# Patient Record
Sex: Male | Born: 1950 | Race: Black or African American | Hispanic: No | Marital: Single | Smoking: Former smoker
Health system: Southern US, Community
[De-identification: ages and names within clinical notes are randomized; demographics above are authoritative.]

## PROBLEM LIST (undated history)

## (undated) DIAGNOSIS — E119 Type 2 diabetes mellitus without complications: Secondary | ICD-10-CM

## (undated) DIAGNOSIS — E785 Hyperlipidemia, unspecified: Secondary | ICD-10-CM

## (undated) DIAGNOSIS — C801 Malignant (primary) neoplasm, unspecified: Secondary | ICD-10-CM

## (undated) DIAGNOSIS — R0600 Dyspnea, unspecified: Secondary | ICD-10-CM

## (undated) DIAGNOSIS — I10 Essential (primary) hypertension: Secondary | ICD-10-CM

## (undated) DIAGNOSIS — I509 Heart failure, unspecified: Secondary | ICD-10-CM

## (undated) HISTORY — PX: LUNG REMOVAL, PARTIAL: SHX233

---

## 2012-04-30 ENCOUNTER — Emergency Department: Payer: Self-pay | Admitting: Emergency Medicine

## 2012-04-30 LAB — BASIC METABOLIC PANEL WITH GFR
Anion Gap: 7 (ref 7–16)
BUN: 11 mg/dL (ref 7–18)
Calcium, Total: 9.1 mg/dL (ref 8.5–10.1)
Chloride: 107 mmol/L (ref 98–107)
Co2: 26 mmol/L (ref 21–32)
Creatinine: 1.05 mg/dL (ref 0.60–1.30)
EGFR (African American): >60
EGFR (Non-African Amer.): >60
Glucose: 80 mg/dL (ref 65–99)
Osmolality: 278 (ref 275–301)
Potassium: 3.7 mmol/L (ref 3.5–5.1)
Sodium: 140 mmol/L (ref 136–145)

## 2012-04-30 LAB — CBC
HCT: 39.4 % — ABNORMAL LOW (ref 40.0–52.0)
HGB: 13.1 g/dL (ref 13.0–18.0)
MCH: 29.3 pg (ref 26.0–34.0)
MCHC: 33.2 g/dL (ref 32.0–36.0)
MCV: 88 fL (ref 80–100)
Platelet: 267 x10 3/mm 3 (ref 150–440)
RBC: 4.47 x10 6/mm 3 (ref 4.40–5.90)
RDW: 14.2 % (ref 11.5–14.5)
WBC: 5.8 x10 3/mm 3 (ref 3.8–10.6)

## 2012-04-30 LAB — CK TOTAL AND CKMB (NOT AT ARMC): CK-MB: 0.9 ng/mL (ref 0.5–3.6)

## 2012-04-30 LAB — TROPONIN I: Troponin-I: <0.02 ng/mL

## 2013-04-06 ENCOUNTER — Observation Stay: Payer: Self-pay | Admitting: Internal Medicine

## 2013-04-06 LAB — CK TOTAL AND CKMB (NOT AT ARMC)
CK, Total: 146 U/L (ref 35–232)
CK-MB: 0.5 ng/mL (ref 0.5–3.6)

## 2013-04-06 LAB — BASIC METABOLIC PANEL
Anion Gap: 3 — ABNORMAL LOW (ref 7–16)
Calcium, Total: 9 mg/dL (ref 8.5–10.1)
Chloride: 103 mmol/L (ref 98–107)
Co2: 33 mmol/L — ABNORMAL HIGH (ref 21–32)
EGFR (African American): 48 — ABNORMAL LOW
Osmolality: 280 (ref 275–301)
Potassium: 3.5 mmol/L (ref 3.5–5.1)

## 2013-04-06 LAB — CBC
HCT: 40 % (ref 40.0–52.0)
MCHC: 33.9 g/dL (ref 32.0–36.0)
Platelet: 274 10*3/uL (ref 150–440)
RBC: 4.58 10*6/uL (ref 4.40–5.90)
RDW: 13.3 % (ref 11.5–14.5)
WBC: 6.6 10*3/uL (ref 3.8–10.6)

## 2013-04-06 LAB — TSH: Thyroid Stimulating Horm: 1.52 u[IU]/mL

## 2013-04-06 LAB — TROPONIN I: Troponin-I: <0.02 ng/mL

## 2013-04-07 LAB — BASIC METABOLIC PANEL
Calcium, Total: 9.1 mg/dL (ref 8.5–10.1)
Chloride: 108 mmol/L — ABNORMAL HIGH (ref 98–107)
Co2: 28 mmol/L (ref 21–32)
Creatinine: 1.12 mg/dL (ref 0.60–1.30)
EGFR (Non-African Amer.): >60
Glucose: 75 mg/dL (ref 65–99)
Osmolality: 281 (ref 275–301)
Potassium: 4.3 mmol/L (ref 3.5–5.1)
Sodium: 141 mmol/L (ref 136–145)

## 2013-09-02 DIAGNOSIS — C3490 Malignant neoplasm of unspecified part of unspecified bronchus or lung: Secondary | ICD-10-CM | POA: Insufficient documentation

## 2014-09-25 ENCOUNTER — Observation Stay: Payer: Self-pay | Admitting: Internal Medicine

## 2014-09-25 LAB — CK TOTAL AND CKMB (NOT AT ARMC)
CK, TOTAL: 119 U/L
CK, Total: 124 U/L
CK, Total: 114 U/L
CK-MB: 1.1 ng/mL (ref 0.5–3.6)
CK-MB: 1.4 ng/mL (ref 0.5–3.6)
CK-MB: 1.2 ng/mL (ref 0.5–3.6)

## 2014-09-25 LAB — COMPREHENSIVE METABOLIC PANEL
ALBUMIN: 3.8 g/dL (ref 3.4–5.0)
ALK PHOS: 86 U/L
ANION GAP: 7 (ref 7–16)
AST: 36 U/L (ref 15–37)
BILIRUBIN TOTAL: 0.4 mg/dL (ref 0.2–1.0)
BUN: 17 mg/dL (ref 7–18)
CHLORIDE: 104 mmol/L (ref 98–107)
CREATININE: 1.31 mg/dL — AB (ref 0.60–1.30)
Calcium, Total: 9.1 mg/dL (ref 8.5–10.1)
Co2: 32 mmol/L (ref 21–32)
EGFR (African American): >60
EGFR (Non-African Amer.): 59 — ABNORMAL LOW
GLUCOSE: 79 mg/dL (ref 65–99)
Osmolality: 285 (ref 275–301)
Potassium: 3.6 mmol/L (ref 3.5–5.1)
SGPT (ALT): 32 U/L
Sodium: 143 mmol/L (ref 136–145)
TOTAL PROTEIN: 7.8 g/dL (ref 6.4–8.2)

## 2014-09-25 LAB — TROPONIN I
TROPONIN-I: 0.04 ng/mL
Troponin-I: <0.02 ng/mL
Troponin-I: 0.05 ng/mL

## 2014-09-25 LAB — CBC
HCT: 41.5 % (ref 40.0–52.0)
HGB: 13.2 g/dL (ref 13.0–18.0)
MCH: 28.4 pg (ref 26.0–34.0)
MCHC: 31.8 g/dL — ABNORMAL LOW (ref 32.0–36.0)
MCV: 89 fL (ref 80–100)
Platelet: 198 10*3/uL (ref 150–440)
RBC: 4.64 10*6/uL (ref 4.40–5.90)
RDW: 12.9 % (ref 11.5–14.5)
WBC: 7.1 10*3/uL (ref 3.8–10.6)

## 2014-09-25 LAB — LIPID PANEL
Cholesterol: 162 mg/dL (ref 0–200)
HDL: 56 mg/dL (ref 40–60)
Ldl Cholesterol, Calc: 90 mg/dL (ref 0–100)
Triglycerides: 79 mg/dL (ref 0–200)
VLDL Cholesterol, Calc: 16 mg/dL (ref 5–40)

## 2014-09-25 LAB — PRO B NATRIURETIC PEPTIDE: B-TYPE NATIURETIC PEPTID: 31 pg/mL (ref 0–125)

## 2014-09-25 LAB — D-DIMER(ARMC): D-DIMER: 1962 ng/mL

## 2014-09-26 LAB — CBC WITH DIFFERENTIAL/PLATELET
Basophil #: 0.1 10*3/uL (ref 0.0–0.1)
Basophil %: 0.9 %
EOS ABS: 0.2 10*3/uL (ref 0.0–0.7)
Eosinophil %: 3.4 %
HCT: 36.9 % — ABNORMAL LOW (ref 40.0–52.0)
HGB: 12.2 g/dL — AB (ref 13.0–18.0)
Lymphocyte #: 1.9 10*3/uL (ref 1.0–3.6)
Lymphocyte %: 33.7 %
MCH: 29.1 pg (ref 26.0–34.0)
MCHC: 33.1 g/dL (ref 32.0–36.0)
MCV: 88 fL (ref 80–100)
MONOS PCT: 11.7 %
Monocyte #: 0.7 x10 3/mm (ref 0.2–1.0)
NEUTROS ABS: 2.8 10*3/uL (ref 1.4–6.5)
NEUTROS PCT: 50.3 %
PLATELETS: 199 10*3/uL (ref 150–440)
RBC: 4.19 10*6/uL — ABNORMAL LOW (ref 4.40–5.90)
RDW: 13.1 % (ref 11.5–14.5)
WBC: 5.6 10*3/uL (ref 3.8–10.6)

## 2014-09-26 LAB — LIPID PANEL
CHOLESTEROL: 139 mg/dL (ref 0–200)
HDL Cholesterol: 43 mg/dL (ref 40–60)
Ldl Cholesterol, Calc: 80 mg/dL (ref 0–100)
Triglycerides: 79 mg/dL (ref 0–200)
VLDL CHOLESTEROL, CALC: 16 mg/dL (ref 5–40)

## 2014-09-26 LAB — COMPREHENSIVE METABOLIC PANEL
Albumin: 3.3 g/dL — ABNORMAL LOW (ref 3.4–5.0)
Alkaline Phosphatase: 73 U/L
Anion Gap: 7 (ref 7–16)
BUN: 16 mg/dL (ref 7–18)
Bilirubin,Total: 0.2 mg/dL (ref 0.2–1.0)
CHLORIDE: 104 mmol/L (ref 98–107)
Calcium, Total: 8.4 mg/dL — ABNORMAL LOW (ref 8.5–10.1)
Co2: 30 mmol/L (ref 21–32)
Creatinine: 1.34 mg/dL — ABNORMAL HIGH (ref 0.60–1.30)
GFR CALC NON AF AMER: 58 — AB
Glucose: 82 mg/dL (ref 65–99)
Osmolality: 282 (ref 275–301)
Potassium: 3.4 mmol/L — ABNORMAL LOW (ref 3.5–5.1)
SGOT(AST): 27 U/L (ref 15–37)
SGPT (ALT): 29 U/L
SODIUM: 141 mmol/L (ref 136–145)
TOTAL PROTEIN: 6.8 g/dL (ref 6.4–8.2)

## 2015-04-09 NOTE — H&P (Signed)
PATIENT NAME:  [name] [name], [name] MR#:  [id number] DATE OF BIRTH:  23-Dec-1950  DATE OF ADMISSION:  04/06/2013  PRIMARY CARE PHYSICIAN:  [facility] [place]:  Dr. Renard Hamper.   CHIEF COMPLAINT:  Rapid heartbeat.   HISTORY OF PRESENT ILLNESS:  The patient is a 64 year old African American male usually follow up at the [facility] in [place].  The patient has history of lung cancer, status post left pneumonectomy, history of hypertension and hypercholesterolemia.  He was in his usual state of health until last evening around 11:00 p.m. when he developed palpitations described as rapid heartbeats in his chest that was fast.  There was no associated chest pain.  No shortness of breath.  No syncope.  He has no prior history of such palpitations or any cardiac history.  He came to the Emergency Department for evaluation.  He was found to have atrial fibrillation with rapid ventricular rate.  The patient was admitted for further evaluation and treatment.  His heart rhythm showed atrial fibrillation with a rate of 150 per minute.   REVIEW OF SYSTEMS:  CONSTITUTIONAL:  Denies having any fever.  No chills.  No fatigue.  EYES:  No blurring of vision.  No double vision.  EARS, NOSE, THROAT:  No hearing impairment.  No sore throat.  No dysphagia.  CARDIOVASCULAR:  Reports palpitations as above.  No chest pain.  No shortness of breath.  No syncope.  RESPIRATORY:  No shortness of breath.  No chest pain.  No cough.  No hemoptysis.  GASTROINTESTINAL:  No abdominal pain.  No vomiting.  No diarrhea.  GENITOURINARY:  No dysuria.  No frequency of urination.  MUSCULOSKELETAL:  No joint pain or swelling.  No muscular pain or swelling.  No calf tenderness.  INTEGUMENTARY:  No skin rash.  No ulcers.  NEUROLOGY:  No focal weakness.  No seizure activity.  No headache.  PSYCHIATRY:  No anxiety.  No depression.  ENDOCRINE:  No polyuria or polydipsia.  No heat or cold intolerance.   PAST MEDICAL HISTORY:  History of  lung cancer status post left pneumonectomy in 2005, systemic hypertension, hypercholesterolemia.   PAST SURGICAL HISTORY:  Left lung pneumonectomy in 2005 for lung cancer.  No other surgeries in the past.   SOCIAL HABITS:  Chronic smoker 1 pack a day on and off since age of 63.  He stopped smoking in 2004.  He drinks alcohol only socially.  No other drug abuse.   SOCIAL HISTORY:  He is divorced, lives at home alone.  He is on disability.   FAMILY HISTORY:  Both parents are deceased.  He does not know how did his father died.  His mother suffered from heart problems and she died at the age of 66.   ADMISSION MEDICATIONS:  The patient did not bring his medications, but he remembers he is on lisinopril and a statin.   ALLERGIES:  No known drug allergies.   PHYSICAL EXAMINATION: VITAL SIGNS:  Blood pressure was 100/70, respiratory rate 18, his pulse earlier was 150, then gradually down to 106 per minute.  Temperature 98.6, oxygen saturation 98%.  GENERAL APPEARANCE:  Middle-aged male lying in bed in no acute distress.  HEAD AND NECK:  No pallor.  No icterus.  No cyanosis.  Ear examination revealed normal hearing, no discharge, no lesions.  Examination of the nose showed no discharge, no bleeding.  No ulcers.  Examination of the oropharyngeal area showed normal lips and tongue, no oral thrush, no  exudates, no ulcers.  Eye examination revealed normal eyelids and conjunctivae.  Pupils are constricted, reactive to light.  NECK:  Supple.  Trachea at midline.  No thyromegaly.  No cervical lymphadenopathy.  No masses.  HEART:  Revealed irregular S1 and S2.  No S3 or S4.  No murmur.  No gallop.  No carotid bruits.  RESPIRATORY:  Revealed a normal breathing pattern.  The lungs are clear on the right.  On the left absent breathing sounds and dullness in percussion.  ABDOMEN:  Soft without tenderness.  No hepatosplenomegaly.  No masses.  No hernias.  SKIN:  Revealed no ulcers.  No subcutaneous nodules.   MUSCULOSKELETAL:  No joint swelling.  No clubbing.  NEUROLOGIC:  Cranial nerves II through XII are intact.  No focal motor deficit.  PSYCHIATRIC:  The patient is alert and oriented x 3.  Mood and affect were normal.   LABORATORY FINDINGS:  His EKG, the rhythm strip showed atrial fibrillation with rapid ventricular rate at 150 per minute.  Subsequent EKG showed improvement in the heart rate down to 97 per minute, remains in atrial fibrillation.  Nonspecific T-wave abnormalities.  Otherwise, unremarkable EKG.  His chest x-ray showed total opacification or whitening of the left side of the chest consistent with his history of left pneumonectomy.  The right lung is clear.  His blood work-up showed a glucose of 114, BUN 16, creatinine 1.76.  We do not have a baseline for his creatinine.  Sodium 139, potassium 3.5.  Estimated GFR is 48.  Calcium 9.8.  Troponin less than 0.02.  CBC showed a white count of 6000, hemoglobin 13, hematocrit 40, platelet count 274.   ASSESSMENT: 1.  New onset of atrial fibrillation with rapid ventricular rate.  2.  Systemic hypertension.  3.  Renal failure, likely it is chronic kidney disease stage 3, however I cannot confirm that unless we bring his records from the [facility].  4.  Hypercholesterolemia.  5.  History of lung cancer, status post left pneumonectomy.   PLAN:  To admit the patient to the telemetry.  I will start metoprolol 25 mg twice a day to slow the heart rate.  Start aspirin 325 mg a day.  Check TSH.  Obtain echocardiogram.  Cardiology consultation.  IV hydration and repeat basic metabolic profile tomorrow.  I will hold his lisinopril for the time being since he has borderline blood pressure.  I will continue statin, although he does not remember the name.  I will place him on simvastatin 20 mg a day.  We will start subcutaneous heparin 5000 units twice a day for deep vein thrombosis prophylaxis.   Time spent in evaluating this patient took more than 55  minutes.      ____________________________ Clovis Pu. Lenore Manner, MD amd:ea D: 04/06/2013 03:34:19 ET T: 04/06/2013 04:28:35 ET JOB#: [id number]  cc: Clovis Pu. Lenore Manner, MD, <Dictator> Ellin Saba MD ELECTRONICALLY SIGNED 04/07/2013 3:55

## 2015-04-09 NOTE — Consult Note (Signed)
PATIENT NAME:  [name] [name], [name] [name] MR#:  [id number] DATE OF BIRTH:  08/03/1951  DATE OF CONSULTATION:  04/06/2013  REFERRING PHYSICIAN:  Dr. Doy Hutching. CONSULTING PHYSICIAN:  Corey Skains, MD  REASON FOR CONSULTATION: Chronic kidney disease, hypertension, shortness of breath with atrial fibrillation with rapid ventricular rate.   CHIEF COMPLAINT: "I got short of breath."   HISTORY OF PRESENT ILLNESS: This is a 64 year old male with known hypertension and chronic kidney disease, on appropriate medications in the past with reasonable control. The patient has had new onset of shortness of breath with and without physical activity and having some significant palpitations recently, most consistent with atrial fibrillation. The patient has had admission to the Emergency Room with an EKG showing atrial fibrillation with rapid ventricular rate and nonspecific ST and T wave changes. He has had appropriate medication management and he has spontaneously converted to normal sinus rhythm at  this time. Current EKG shows normal sinus rhythm.   REVIEW OF SYSTEMS: The remainder of his review of systems is negative for vision change, ringing in the ears, hearing loss, cough, congestion, heartburn, nausea, vomiting, diarrhea, bloody stools, stomach pain, extremity pain, leg weakness, cramping of the buttocks, known blood clots, headaches, blackouts, dizzy spells, nosebleeds, congestion, trouble swallowing, frequent urination, urination at night, muscle weakness, numbness, anxiety, depression, skin lesions, skin rashes.   PAST MEDICAL HISTORY: 1.  Hypertension.  2.  Chronic kidney disease.  3.  Atrial fibrillation.   FAMILY HISTORY: No family members with early onset of cardiovascular disease or hypertension.   SOCIAL HISTORY: Currently denies tobacco use, occasionally drinking alcohol.   ALLERGIES:  AS LISTED.   MEDICATIONS: As listed.   PHYSICAL EXAMINATION: VITAL SIGNS: Blood pressure 133/68 bilaterally.  Heart rate 68 upright, reclining and regular.  GENERAL: He is a well-appearing male in no acute distress.  HEAD, EYES, EARS, NOSE AND THROAT: No icterus, thyromegaly, ulcers, hemorrhage or xanthelasma.  CARDIOVASCULAR: Regular rate and rhythm with normal S1 and S2 without murmur, gallop or rub. PMI is normal size and placement. Carotid upstroke normal without bruit. Jugular venous pressure is normal.  LUNGS: Lungs are clear to auscultation with normal respirations.  ABDOMEN: Soft, nontender, without hepatosplenomegaly or masses. Abdominal aorta is normal size without bruit.  EXTREMITIES: Show 2+ bilateral pulses in dorsal, pedal, radial and femoral arteries without lower extremity edema, cyanosis, clubbing or ulcers.  NEUROLOGIC: He is oriented to time, place and [place] with normal mood and affect.   ASSESSMENT: A 64 year old male with chronic kidney disease, hypertension with new onset of shortness of breath and palpitations consistent with atrial fibrillation with rapid ventricular rate, now in normal sinus rhythm on appropriate medications.   RECOMMENDATIONS: 1.  Continue current medication management for maintenance of normal rhythm and heart rate control without change.  2.  No anticoagulation at this time due to CHADs score of 1 and using aspirin due to maintenance of normal rhythm and currently in normal rhythm.  3.  Echocardiogram for LV systolic dysfunction and valvular heart disease.  4.  Ambulate and follow for any further significant symptoms and possible discharge to home if the patient feeling well and no further significant symptoms with followup with adjustments of medications thereafter.   ____________________________ Corey Skains, MD bjk:cs D: 04/06/2013 14:33:56 ET T: 04/06/2013 15:57:17 ET JOB#: [id number]  cc: Corey Skains, MD, <Dictator> Corey Skains MD ELECTRONICALLY SIGNED 04/09/2013 8:12

## 2015-04-09 NOTE — Discharge Summary (Signed)
PATIENT NAME:  [name] [name], [name] [name] MR#:  [id number] DATE OF BIRTH:  09/29/1951  DATE OF ADMISSION:  04/06/2013 DATE OF DISCHARGE:  04/07/2013  DISCHARGE DIAGNOSES:  1.  New onset atrial fibrillation, converted to normal sinus rhythm. 2.  Hypertension.  3.  Lung mass status post lobectomy in the past.  4.  Renal failure, improved.  5.  Hyperlipidemia.   CONDITION ON DISCHARGE: Stable.   CODE STATUS: FULL CODE.   DISCHARGE MEDICATIONS: 1.  Multivitamin 1 tablet orally once a day. 2.  Tramadol 50 mg oral tablet 4 times a day. 3.  Simvastatin 20 mg oral tablet once a day. 4.  Aspirin 81 mg once a day. 5.  Metoprolol 25 mg 2 times a day.   HOME HEALTH ON DISCHARGE: No.   HOME OXYGEN: No.   DIET ON DISCHARGE: Low sodium. Diet consistency regular.   ACTIVITY: As tolerated.   TIMEFRAME TO FOLLOWUP: Within 2 to 4 weeks.  Routine followups with primary care physician.   HISTORY OF PRESENT ILLNESS: This is a 64 year old African American male with history of lung cancer status post left pneumonectomy, hypertension and hypercholesterolemia.  In the previous night he developed palpitations described as rapid heartbeats that was fast associated with chest pain. No shortness of breath. No syncopal episode. No prior history of palpitations or any cardiac history. Came to the Emergency Room for evaluation and found having atrial fibrillation with ventricular rate of 150 per minute.   HOSPITAL COURSE AND STAY:  New onset atrial fibrillation:  Converted to normal sinus rhythm spontaneously. He was started on metoprolol. Cardiology consult was done and they suggested to do echocardiogram. His heart rate remained under control with metoprolol and so no further anticoagulation suggested by cardiologist. He was discharged on aspirin after echocardiogram was done.   OTHER MEDICAL ISSUES ADDRESSED IN THIS HOSPITAL STAY: 1.  Systemic hypertension. Came under control with metoprolol.  2.  Renal failure. Likely it  was chronic kidney disease, stage III. We repeated it and remained stable.  3.  Hypercholesterolemia. Simvastatin was given.  4.  History of lung cancer status post left pneumonectomy. The patient was following at the [facility] for this. We advised to continue followup.  IMPORTANT LABORATORY AND DIAGNOSTICS:  On presentation, creatinine was 1.76, which came down to 1.12. Troponin, all 3 remain negative. TSH was 1.52, normal. WBC total  6600, normal.   Echocardiogram:  Left ventricular ejection fraction 60%, decreased left ventricular  size, mild to moderate mitral valve regurgitation, mild to moderate tricuspid regurgitation.   TOTAL TIME SPENT ON THIS DISCHARGE: 45 minutes.  ____________________________ Ceasar Lund Anselm Jungling, MD vgv:sb D: 04/11/2013 08:34:20 ET T: 04/11/2013 09:26:05 ET JOB#: [id number]  cc: Ceasar Lund. Anselm Jungling, MD, <Dictator> Vaughan Basta MD ELECTRONICALLY SIGNED 04/12/2013 22:48

## 2015-04-10 NOTE — Consult Note (Signed)
   Present Illness The patient is a 64 year old African American male with past medical history significant for history of hypertension, hyperlipidemia, who is noncompliant with the use of statin, as well as history of lung cancer, status post Lobectomy.  He has ruled out for myocardial infarction.  He complains of shortness of breath with ambulation with occasional chest pain.  He denies any prior cardiac history.  Electrocardiogram revealed nonspecific ST T wave changes with no evidence of ischemia. he had a trivial troponin elevation felt to be secondary to mild demand ischemia.   Physical Exam:  GEN thin   HEENT PERRL   NECK supple   RESP decreased breath sounds on the left   CARD Regular rate and rhythm   ABD denies tenderness   LYMPH negative neck   EXTR negative cyanosis/clubbing   SKIN normal to palpation   NEURO cranial nerves intact, motor/sensory function intact   PSYCH A+O to time, place, [place]   Review of Systems:  Subjective/Chief Complaint Exertional shortness of breath   General: Fatigue   Skin: No Complaints   ENT: No Complaints   Eyes: No Complaints   Neck: No Complaints   Respiratory: Short of breath   Cardiovascular: Dyspnea   Gastrointestinal: No Complaints   Genitourinary: No Complaints   Vascular: No Complaints   Musculoskeletal: No Complaints   Neurologic: No Complaints   Hematologic: No Complaints   Endocrine: No Complaints   Psychiatric: No Complaints   Review of Systems: All other systems were reviewed and found to be negative   Medications/Allergies Reviewed Medications/Allergies reviewed   Family & Social History:  Family and Social History:  Family History COPD    No Known Allergies:    Impression 64 year old male with history of lung carcinoma status post left lung lobectomy.  He was admitted with progressive shortness of breath.  He has ruled out from are cardia infarction.  He does have trivial troponin elevation  felt to be secondary to demand ischemia.  Echocardiogram reveals normal left ventricular function with level  evidence moderate pulmonary hypertension.  His right ventricle is mildly enlarged.  There is no left-sided abnormalities.  Valves appear normal.  His symptoms appear to be secondary to pulmonary exacerbation.  His echo reveals preserved left ventricular function.  He underwent an ETT today with  limited x-rays tolerance due to shortness of breath.  There were no ischemic changes.  Will review the sestamibi images when available.  Should these be normal, would discharge with outpatient follow-up with his pulmonary and Oncology physicians.   Plan 1. Continue current medications 2. Will review sestamibi images when available 3. Activity as tolerated 4. further recommendations after functional study images are reviewed however if no ischemia, would discharge with outpatient follow-up.   Electronic Signatures: Teodoro Spray (MD)  (Signed 10-Oct-15 10:55)  Authored: General Aspect/Present Illness, History and Physical Exam, Review of System, Family & Social History, Allergies, Impression/Plan   Last Updated: 10-Oct-15 10:55 by Teodoro Spray (MD)

## 2015-04-10 NOTE — Discharge Summary (Signed)
PATIENT NAME:  [name] [name], [name] [name] MR#:  [id number] DATE OF BIRTH:  1951/11/27  DATE OF ADMISSION:  09/25/2014 DATE OF DISCHARGE:  09/26/2014  ADMISSION DIAGNOSES:   1.  Acute dyspnea.  2.  Chest pain.   DISCHARGE DIAGNOSES:  1.  Shortness of breath, probably related more to elevated blood pressure and possible underlying chronic obstructive pulmonary disease without a formal diagnosis.  2.  Chest pain, atypical in nature.  3.  Essential hypertension.   LABORATORY DATA: Discharge white blood cells 5.6, hemoglobin 12.2, hematocrit 37, platelets are 199,000.   Echocardiogram showed normal ejection fraction of 60-65% with no valvular abnormalities.   Nuclear medicine stress test showed low-risk scan. No significant wall motion abnormalities. Ejection fraction at that point was 23%; however, I did speak with Dr. Ubaldo Glassing and this does not seem accurate. The echo was more accurate for his ejection fraction.   PERTINENT LABORATORIES: Troponins x 3 were negative.   LDL IS 80. Cholesterol 39. HDL 43.   CT of the chest was negative for pulmonary emboli, but it did show some emphysema.   HOSPITAL COURSE: A 64 year old male who presented with acute dyspnea. For further details, please refer to the H and P.  1.  Acute dyspnea. The patient's blood pressure was elevated when he had this dyspneic attack. He is no longer complaining of any dyspnea, at this is more likely related to his elevated blood pressure, or could be related to underlying undiagnosed chronic obstructive pulmonary disease/emphysema. In any case, he had a CT of the chest which was negative for PE and also underwent echocardiogram, which showed a normal ejection fraction.  2.  Atypical chest pain, not cardiac in nature. The patient underwent a nuclear medicine stress test which essentially was a low risk test and all troponins were negative. Telemetry was also negative. His ejection fraction on the Myoview was 23%. However, I did speak with Dr.  Ubaldo Glassing, who actually thought that this might have been done while there was a PVC. His echocardiogram shows a normal ejection fraction, which is believed to be the true representation of his EF.  3.  Essential hypertension. Initially the patient's blood pressure was elevated. It has been stable here and he will return to home on his outpatient medications.  Possible emphysema. I discussed with the patient that he will need outpatient pulmonary function tests and pulmonary consultation.   DISCHARGE MEDICATIONS:  1.  Multivitamin 1 tablet daily.  2.  Simvastatin 20 mg daily. 3.  Hydrochlorothiazide/lisinopril 12.5/20 daily.  4.  Aspirin 325 mg daily.  5.  Ventolin 2 puffs 4 times a day.   DISCHARGE DIET: Regular diet.   DISCHARGE ACTIVITY: As tolerated.   DISCHARGE FOLLOWUP: The patient does not have a primary care physician in this area, but will need to follow up.   PHYSICAL EXAMINATION  VITAL SIGNS: On discharge the patient is afebrile, temperature 97.6. Pulse is a 74. Respirations 18. Blood pressure 102/78. Oxygen saturation 98% on room air.  GENERAL: The patient is alert and oriented, not in acute distress.   CARDIOVASCULAR: Regular rate and rhythm. No murmur, gallops or rubs. PMI is not displaced.  LUNGS: Clear to auscultation, without crackles, rales, rhonchi or wheezing. Normal to percussion.  ABDOMEN: Bowel sounds positive. Nontender, nondistended. No hepatosplenomegaly.  EXTREMITIES: No clubbing, cyanosis, or edema. NEUROLOGIC: Cranial nerves II through XII are intact. There are no focal deficits.  MUSCULOSKELETAL: There is 5/5 strength in all extremities.    DISPOSITION: The patient was stable  for discharge.  TIME SPENT: Approximately 40 minutes on discharge.    ____________________________ Gaylia Kassel P. Benjie Karvonen, MD spm:MT D: 09/26/2014 12:13:53 ET T: 09/26/2014 13:38:01 ET JOB#: [id number]  cc: Klayten Jolliff P. Benjie Karvonen, MD, <Dictator> Donell Beers Afia Messenger MD ELECTRONICALLY SIGNED 09/26/2014 21:22

## 2015-04-10 NOTE — H&P (Signed)
PATIENT NAME:  [name] [name], [name] [name] MR#:  [id number] DATE OF BIRTH:  Aug 29, 1951  DATE OF ADMISSION:  09/25/2014  PRIMARY CARE PHYSICIAN:  unknown at present   HISTORY OF PRESENT ILLNESS:  The patient is a 64 year old African American male with past medical history significant for history of hypertension, hyperlipidemia, who is noncompliant with the use of statin, as well as history of lung cancer, status post lobectomy in 2005 who presented to the hospital with complaints of shortness of breath. Apparently, according to patient, he woke up in the morning at around 5:00 a.m. and he became suddenly very short of breath.  He felt frightened and he was brought to the Emergency Room for further evaluation. In the Emergency Room, he was felt to have to have mild COPD exacerbation since his CT scan of chest revealed significant emphysema. He was given inhalation therapy, however, because of mild elevation of troponin from 0.02-0.05, as well as chest pains which were elicited upon further questioning of this patient, hospitalist services were contacted for admission.   PAST MEDICAL HISTORY: Significant for history of hypertension, hyperlipidemia, however, noncompliant with statin. He takes statin probably 3 or 4 times a week. History of left lung carcinoma status post lobectomy in 2005, history of atrial fibrillation converted to sinus rhythm in April 2014, also renal failure during the same admission.   OUTPATIENT MEDICATIONS:  Aspirin 81 mg p.o. daily, HCTZ/lisinopril 12.5 mg/10 mg 1 daily, multivitamins once daily and simvastatin 10 mg p.o. at bedtime, however, the patient takes 3 or 4 times a week only.   SURGICAL HISTORY: Left lung lobectomy in 2005 for lung carcinoma. No other surgeries.   SOCIAL HISTORY: He used to smoke 1 pack a day for approximately 5-7 years, stopped smoking in 2005, alcohol socially, no drug abuse.   SOCIAL HISTORY: Divorced, lives at home alone, on disability, works from home.   REVIEW  OF SYSTEMS: Positive for pains in the chest as well as left shoulder pains intermittently, admits to pains whenever he exerts himself intermittently, however, not all the time. He admits to seasonal allergies, significant shortness of breath, which happened today early in the morning for which he says he does not take any inhalers. Left shoulder pains, left knee pains intermittently. He exercises quite extensively. He does push-ups as well as jumping jacks, sit-ups at least 4 times a week. He also walks, but admits to having some intermittent chest pains with exertion, denies any high fevers or chills, fatigue, weakness, weight loss or gain.  EYES: Denies any blurry vision, double vision, glaucoma or cataracts.  EARS: Denies tinnitus, epistaxis, sinus pain, dentures, difficulty swallowing.  RESPIRATORY: Denies any wheezes, cough, phlegm production, but has some shortness of breath.   CARDIOVASCULAR: Denies chest pains, orthopnea, arrhythmias, palpitations or syncope. Admits to having chest pains as mentioned above, intermittently with exertion.  GASTROINTESTINAL: Denies nausea, vomiting, diarrhea or constipation. GENITOURINARY: Denies dysuria or hematuria, frequency, incontinence.  ENDOCRINOLOGY: Denies polydipsia, thyroid problems, heat or cold intolerance or thirst.  HEMATOLOGIC: Denies anemia, easy bruising or bleeding, swollen glands.  SKIN: Denies any acne, rash, lesions, or change in moles.  MUSCULOSKELETAL: Denies arthritis, cramps, swelling.  NEUROLOGIC: Denies numbness, or thirst or tremor.  PSYCHIATRIC: Denies anxiety, insomnia, depression.   PHYSICAL EXAMINATION:  VITAL SIGNS: On arrival to the hospital the patient's vital signs: Temperature was not measured, pulse is 88, respirations were 20, blood pressure 157/97, Oxygenation was 96% on room air. Apparent on EMS arrival the patient's blood pressure was as  high as 180 initially.  GENERAL: This is a well-developed, well-nourished African  American male in no significant distress, walking in the room.  HEENT: His pupils equal, reactive to light. Extraocular movements are intact, no icterus or conjunctivitis. Has normal hearing. No pharyngeal erythema. Mucosa is moist.  NECK: No masses. Supple, nontender. Thyroid is not enlarged. No adenopathy. No JVD or carotid bruits bilaterally. Full range of motion.  LUNGS: Diminished breath sounds bilaterally, especially on the left side, but no significant rales, rhonchi or wheezing. No labored inspirations, increased dullness to percussion, no overt respiratory distress.  CARDIOVASCULAR: S1, S2 appreciated. Rhythm is regular, PMI not lateralized.   CHEST:  Nontender to palpation.  EXTREMITIES:  1+ pedal pulses. No lower extremity edema, calf tenderness or cyanosis was noted.  ABDOMEN: Soft, nontender. Bowel sounds are present. No splenomegaly or masses were noted.  RECTAL: Deferred.  MUSCLE STRENGTH: Able to move all extremities. No cyanosis, degenerative joint disease or kyphosis. Gait was not tested.  SKIN: Did not reveal any rashes, lesions, erythema, nodularity, induration. It was warm and dry to palpation.  LYMPHATIC: No adenopathy in the cervical region.  NEUROLOGIC: Cranial nerves grossly intact. Sensory is intact. No dysarthria or aphasia. The patient is alert, oriented to time, [place] and place, cooperative. Memory is good.  PSYCHIATRIC: No significant confusion, agitation, or depression were noted.   LABORATORY DATA:  BMP:  Creatinine 1.31, otherwise BMP was unremarkable. Liver enzymes were unremarkable. Cardiac enzymes x 2 were normal. CBC: White blood cell count was 7.1, hemoglobin was 13.2, platelet count 198,000. D-dimer elevated at 1962.    EKG showed normal sinus rhythm nonspecific ST-T changes.   RADIOLOGIC STUDIES: Chest x-ray PA and lateral 09/25/2014, showed status post left pneumonectomy with shift of heart and mediastinum toward the right, right lung is clear. No  appreciable change from prior study according to radiologist.   CT scan of the chest with IV contrast 09/25/2014, revealed a previous left pneumonectomy, negative for acute PE or aortic dissection. Emphysema was also noted.   ASSESSMENT AND PLAN:  1.  Acute dyspnea, likely mild chronic obstructive pulmonary disease exacerbation. Admitted to the medical floor. Initially DuoNebs, Advair Diskus as well as Combivent.  2.  Chest pain, started patient on metoprolol, aspirin, nitroglycerin and heparin subcutaneously. I will get a stress test in the morning.  3.  Malignant essential hypertension. We will continue home medications following his blood pressure very closely. The patient will have nitroglycerin as well as metoprolol added during this admission.  4.  Renal insufficiency. We will follow in the morning. We will start the patient on low rate IV fluids due to IV dye for CTA.   TIME SPENT: 50 minutes.    ____________________________ Theodoro Grist, MD rv:lt D: 09/25/2014 17:57:56 ET T: 09/25/2014 19:09:00 ET JOB#: [id number]  cc: Theodoro Grist, MD, <Dictator> Tiane Szydlowski MD ELECTRONICALLY SIGNED 10/16/2014 12:19

## 2017-02-21 DIAGNOSIS — R0781 Pleurodynia: Secondary | ICD-10-CM | POA: Insufficient documentation

## 2019-12-30 DIAGNOSIS — I10 Essential (primary) hypertension: Secondary | ICD-10-CM | POA: Insufficient documentation

## 2019-12-30 DIAGNOSIS — R1030 Lower abdominal pain, unspecified: Secondary | ICD-10-CM | POA: Insufficient documentation

## 2019-12-30 DIAGNOSIS — Z85118 Personal history of other malignant neoplasm of bronchus and lung: Secondary | ICD-10-CM | POA: Insufficient documentation

## 2019-12-30 DIAGNOSIS — Z902 Acquired absence of lung [part of]: Secondary | ICD-10-CM | POA: Insufficient documentation

## 2020-01-06 DIAGNOSIS — I499 Cardiac arrhythmia, unspecified: Secondary | ICD-10-CM | POA: Insufficient documentation

## 2020-05-25 DIAGNOSIS — M25519 Pain in unspecified shoulder: Secondary | ICD-10-CM | POA: Insufficient documentation

## 2020-12-30 ENCOUNTER — Other Ambulatory Visit: Payer: Self-pay

## 2020-12-30 ENCOUNTER — Other Ambulatory Visit
Admission: RE | Admit: 2020-12-30 | Discharge: 2020-12-30 | Disposition: A | Discharge status: Home / Self Care | Payer: Medicare Other | Source: Ambulatory Visit | Attending: Internal Medicine | Admitting: Internal Medicine

## 2020-12-30 DIAGNOSIS — Z20822 Contact with and (suspected) exposure to covid-19: Secondary | ICD-10-CM | POA: Insufficient documentation

## 2020-12-30 DIAGNOSIS — Z01812 Encounter for preprocedural laboratory examination: Secondary | ICD-10-CM | POA: Diagnosis present

## 2020-12-31 ENCOUNTER — Encounter: Payer: Self-pay | Admitting: Internal Medicine

## 2020-12-31 LAB — SARS CORONAVIRUS 2 (TAT 6-24 HRS): SARS Coronavirus 2: NEGATIVE

## 2021-01-03 ENCOUNTER — Ambulatory Visit: Admission: RE | Admit: 2021-01-03 | Payer: Medicare Other | Source: Home / Self Care | Admitting: Internal Medicine

## 2021-01-03 ENCOUNTER — Encounter: Admission: RE | Payer: Self-pay | Source: Home / Self Care

## 2021-01-03 HISTORY — DX: Hyperlipidemia, unspecified: E78.5

## 2021-01-03 HISTORY — DX: Malignant (primary) neoplasm, unspecified: C80.1

## 2021-01-03 HISTORY — DX: Essential (primary) hypertension: I10

## 2021-01-03 SURGERY — COLONOSCOPY WITH PROPOFOL
Anesthesia: General

## 2021-02-21 ENCOUNTER — Other Ambulatory Visit: Admission: RE | Admit: 2021-02-21 | Payer: Medicare Other | Source: Ambulatory Visit

## 2021-04-19 ENCOUNTER — Encounter: Payer: Self-pay | Admitting: Internal Medicine

## 2021-04-20 ENCOUNTER — Ambulatory Visit: Payer: Medicare Other | Admitting: Certified Registered Nurse Anesthetist

## 2021-04-20 ENCOUNTER — Other Ambulatory Visit: Payer: Self-pay

## 2021-04-20 ENCOUNTER — Ambulatory Visit
Admission: RE | Admit: 2021-04-20 | Discharge: 2021-04-20 | Disposition: A | Discharge status: Home / Self Care | Payer: Medicare Other | Attending: Internal Medicine | Admitting: Internal Medicine

## 2021-04-20 ENCOUNTER — Encounter
Admission: RE | Disposition: A | Discharge status: Home / Self Care | Payer: Self-pay | Source: Home / Self Care | Attending: Internal Medicine

## 2021-04-20 ENCOUNTER — Encounter: Payer: Self-pay | Admitting: Internal Medicine

## 2021-04-20 DIAGNOSIS — R109 Unspecified abdominal pain: Secondary | ICD-10-CM | POA: Diagnosis not present

## 2021-04-20 DIAGNOSIS — Z7982 Long term (current) use of aspirin: Secondary | ICD-10-CM | POA: Diagnosis not present

## 2021-04-20 DIAGNOSIS — K5900 Constipation, unspecified: Secondary | ICD-10-CM | POA: Insufficient documentation

## 2021-04-20 DIAGNOSIS — Z87891 Personal history of nicotine dependence: Secondary | ICD-10-CM | POA: Insufficient documentation

## 2021-04-20 DIAGNOSIS — Z800 Family history of malignant neoplasm of digestive organs: Secondary | ICD-10-CM | POA: Diagnosis not present

## 2021-04-20 DIAGNOSIS — K219 Gastro-esophageal reflux disease without esophagitis: Secondary | ICD-10-CM | POA: Insufficient documentation

## 2021-04-20 DIAGNOSIS — Z79899 Other long term (current) drug therapy: Secondary | ICD-10-CM | POA: Insufficient documentation

## 2021-04-20 DIAGNOSIS — K449 Diaphragmatic hernia without obstruction or gangrene: Secondary | ICD-10-CM | POA: Diagnosis not present

## 2021-04-20 HISTORY — PX: COLONOSCOPY WITH PROPOFOL: SHX5780

## 2021-04-20 HISTORY — PX: ESOPHAGOGASTRODUODENOSCOPY: SHX5428

## 2021-04-20 SURGERY — EGD (ESOPHAGOGASTRODUODENOSCOPY)
Anesthesia: General

## 2021-04-20 MED ORDER — PHENYLEPHRINE HCL (PRESSORS) 10 MG/ML IV SOLN
INTRAVENOUS | Status: DC | PRN
  Administered 2021-04-20: 100 ug via INTRAVENOUS

## 2021-04-20 MED ORDER — PROPOFOL 10 MG/ML IV BOLUS
INTRAVENOUS | Status: DC | PRN
  Administered 2021-04-20: 40 mg via INTRAVENOUS

## 2021-04-20 MED ORDER — LIDOCAINE HCL (CARDIAC) PF 100 MG/5ML IV SOSY
PREFILLED_SYRINGE | INTRAVENOUS | Status: DC | PRN
  Administered 2021-04-20: 60 mg via INTRAVENOUS

## 2021-04-20 MED ORDER — SODIUM CHLORIDE 0.9 % IV SOLN
INTRAVENOUS | Status: DC
  Administered 2021-04-20: 20 mL/h via INTRAVENOUS

## 2021-04-20 MED ORDER — PROPOFOL 500 MG/50ML IV EMUL
INTRAVENOUS | Status: AC
  Filled 2021-04-20: qty 50

## 2021-04-20 MED ORDER — PROPOFOL 500 MG/50ML IV EMUL
INTRAVENOUS | Status: DC | PRN
  Administered 2021-04-20: 170 ug/kg/min via INTRAVENOUS

## 2021-04-20 NOTE — Transfer of Care (Signed)
Immediate Anesthesia Transfer of Care Note  Patient: Tc Grantham  Procedure(s) Performed: ESOPHAGOGASTRODUODENOSCOPY (EGD) (N/A ) COLONOSCOPY WITH PROPOFOL (N/A )  Patient Location: PACU  Anesthesia Type:General  Level of Consciousness: drowsy  Airway & Oxygen Therapy: Patient Spontanous Breathing and Patient connected to nasal cannula oxygen  Post-op Assessment: Report given to RN and Post -op Vital signs reviewed and stable  Post vital signs: Reviewed and stable  Last Vitals:  Vitals Value Taken Time  BP 98/64 04/20/21 1114  Temp    Pulse 110 04/20/21 1114  Resp 19 04/20/21 1114  SpO2 100 % 04/20/21 1114  Vitals shown include unvalidated device data.  Last Pain:  Vitals:   04/20/21 0935  TempSrc: Temporal  PainSc: 0-No pain         Complications: No complications documented.

## 2021-04-20 NOTE — H&P (Signed)
  Outpatient short stay form Pre-procedure 04/20/2021 10:04 AM [name] [name] K. Alice Reichert, M.D.  Primary Physician: .Karl Luke, M.D.  Reason for visit: GERD, family history of colon cancer  History of present illness:Patient with chronic nausea and GERD. Took [employer] for awhile, but became constipated. 70  year old patient presenting for family history of colon cancer. Patient denies any rectal bleeding or involuntary weight loss. Has some constipation.     Current Facility-Administered Medications:  .  0.9 %  sodium chloride infusion, , Intravenous, Continuous, [place], Hilton Cork, MD, Last Rate: 20 mL/hr at 04/20/21 0948, 20 mL/hr at 04/20/21 0948  Medications Prior to Admission  Medication Sig Dispense Refill Last Dose  . aspirin 81 MG chewable tablet Chew by mouth daily.   04/19/2021 at Unknown time  . lisinopril (ZESTRIL) 2.5 MG tablet Take 2.5 mg by mouth daily.   04/20/2021 at 0700  . Multiple Vitamin (MULTIVITAMIN) capsule Take 1 capsule by mouth daily.   04/19/2021 at Unknown time  . pantoprazole (PROTONIX) 40 MG tablet Take 40 mg by mouth daily.   04/19/2021 at Unknown time  . simvastatin (ZOCOR) 20 MG tablet Take 20 mg by mouth daily.   04/19/2021 at Unknown time     Not on File   Past Medical History:  Diagnosis Date  . Cancer ([place])    lung  . Hyperlipidemia   . Hypertension     Review of systems:  Otherwise negative.    Physical Exam  Gen: Alert, oriented. Appears stated age.  HEENT: [place]/AT. PERRLA. Lungs: CTA, no wheezes. CV: RR nl S1, S2. Abd: soft, benign, no masses. BS+ Ext: No edema. Pulses 2+    Planned procedures: Proceed with EGD and  colonoscopy. The patient understands the nature of the planned procedure, indications, risks, alternatives and potential complications including but not limited to bleeding, infection, perforation, damage to internal organs and possible oversedation/side effects from anesthesia. The patient agrees and gives consent  to proceed.  Please refer to procedure notes for findings, recommendations and patient disposition/instructions.     Zayden Hahne K. Alice Reichert, M.D. Gastroenterology 04/20/2021  10:04 AM

## 2021-04-20 NOTE — Anesthesia Preprocedure Evaluation (Signed)
Anesthesia Evaluation  Patient identified by MRN, date of birth, ID band Patient awake    Reviewed: Allergy & Precautions, H&P , NPO status , Patient's Chart, lab work & pertinent test results, reviewed documented beta blocker date and time   History of Anesthesia Complications Negative for: history of anesthetic complications  Airway Mallampati: II  TM Distance: >3 FB Neck ROM: full    Dental  (+) Dental Advidsory Given, Missing, Poor Dentition, Partial Upper   Pulmonary neg pulmonary ROS, former smoker,    Pulmonary exam normal breath sounds clear to auscultation       Cardiovascular Exercise Tolerance: Good hypertension, (-) angina(-) Past MI and (-) Cardiac Stents Normal cardiovascular exam(-) dysrhythmias (-) Valvular Problems/Murmurs Rhythm:regular Rate:Normal     Neuro/Psych negative neurological ROS  negative psych ROS   GI/Hepatic Neg liver ROS, GERD  Controlled and Medicated,  Endo/Other  negative endocrine ROS  Renal/GU negative Renal ROS  negative genitourinary   Musculoskeletal   Abdominal   Peds  Hematology negative hematology ROS (+)   Anesthesia Other Findings Past Medical History: No date: Cancer ([place])     Comment:  lung No date: Hyperlipidemia No date: Hypertension   Reproductive/Obstetrics negative OB ROS                             Anesthesia Physical Anesthesia Plan  ASA: II  Anesthesia Plan: General   Post-op Pain Management:    Induction: Intravenous  PONV Risk Score and Plan: 2 and TIVA and Propofol infusion  Airway Management Planned: Natural Airway and Nasal Cannula  Additional Equipment:   Intra-op Plan:   Post-operative Plan:   Informed Consent: I have reviewed the patients History and Physical, chart, labs and discussed the procedure including the risks, benefits and alternatives for the proposed anesthesia with the patient or authorized  representative who has indicated his/her understanding and acceptance.     Dental Advisory Given  Plan Discussed with: Anesthesiologist, CRNA and Surgeon  Anesthesia Plan Comments:         Anesthesia Quick Evaluation

## 2021-04-20 NOTE — Op Note (Signed)
[facility] Gastroenterology Patient Name: Jansen Glogowski Procedure Date: 04/20/2021 10:44 AM MRN: [id number] Account #: [id number] Date of Birth: [birth year] Admit Type: Outpatient Age: 70 Room: [facility] ENDO ROOM 2 Gender: Male Note Status: Finalized Procedure:             Upper GI endoscopy Indications:           Esophageal reflux Providers:             Benay Pike. Alice Reichert MD, MD Referring MD:          No Local Md, MD (Referring MD) Medicines:             Propofol per Anesthesia Complications:         No immediate complications. Procedure:             Pre-Anesthesia Assessment:                        - The risks and benefits of the procedure and the                         sedation options and risks were discussed with the                         patient. All questions were answered and informed                         consent was obtained.                        - Patient identification and proposed procedure were                         verified prior to the procedure by the nurse. The                         procedure was verified in the procedure room.                        - ASA Grade Assessment: III - A patient with severe                         systemic disease.                        - After reviewing the risks and benefits, the patient                         was deemed in satisfactory condition to undergo the                         procedure.                        After obtaining informed consent, the endoscope was                         passed under direct vision. Throughout the procedure,                         the patient's blood pressure, pulse,  and oxygen                         saturations were monitored continuously. The Endoscope                         was introduced through the mouth, and advanced to the                         third part of duodenum. The upper GI endoscopy was                         accomplished without difficulty. The  patient tolerated                         the procedure well. Findings:      A 2 cm hiatal hernia was present.      The examined duodenum was normal.      Moderate tortuosity of the mid to distal esophagus was noted compatible       with a diagnosis of Presbyesophagus.      The Z-line was regular and was found in the distal esophagus.      The exam was otherwise without abnormality. Impression:            - 2 cm hiatal hernia.                        - Normal examined duodenum.                        - Z-line regular, in the distal esophagus.                        - The examination was otherwise normal.                        - No specimens collected. Recommendation:        - Proceed with colonoscopy Procedure Code(s):     --- Professional ---                        [id number], Esophagogastroduodenoscopy, flexible,                         transoral; diagnostic, including collection of                         specimen(s) by brushing or washing, when performed                         (separate procedure) Diagnosis Code(s):     --- Professional ---                        K21.9, Gastro-esophageal reflux disease without                         esophagitis                        K44.9, Diaphragmatic hernia without obstruction or  gangrene CPT copyright 2019 American Medical Association. All rights reserved. The codes documented in this report are preliminary and upon coder review may  be revised to meet current compliance requirements. Efrain Sella MD, MD 04/20/2021 10:57:45 AM This report has been signed electronically. Number of Addenda: 0 Note Initiated On: 04/20/2021 10:44 AM Estimated Blood Loss:  Estimated blood loss: none.      [facility]

## 2021-04-20 NOTE — Interval H&P Note (Signed)
History and Physical Interval Note:  04/20/2021 10:07 AM  [name] [name]  has presented today for surgery, with the diagnosis of GERD,FAMILY HX.OF COLON CANCER([place]).  The various methods of treatment have been discussed with the patient and family. After consideration of risks, benefits and other options for treatment, the patient has consented to  Procedure(s): ESOPHAGOGASTRODUODENOSCOPY (EGD) (N/A) COLONOSCOPY WITH PROPOFOL (N/A) as a surgical intervention.  The patient's history has been reviewed, patient examined, no change in status, stable for surgery.  I have reviewed the patient's chart and labs.  Questions were answered to the patient's satisfaction.     [name] [name], [name] [name]

## 2021-04-20 NOTE — Anesthesia Postprocedure Evaluation (Signed)
Anesthesia Post Note  Patient: [name] [name]  Procedure(s) Performed: ESOPHAGOGASTRODUODENOSCOPY (EGD) (N/A ) COLONOSCOPY WITH PROPOFOL (N/A )  Patient location during evaluation: Endoscopy Anesthesia Type: General Level of consciousness: awake and alert Pain management: pain level controlled Vital Signs Assessment: post-procedure vital signs reviewed and stable Respiratory status: spontaneous breathing, nonlabored ventilation, respiratory function stable and patient connected to nasal cannula oxygen Cardiovascular status: blood pressure returned to baseline and stable Postop Assessment: no apparent nausea or vomiting Anesthetic complications: no   No complications documented.   Last Vitals:  Vitals:   04/20/21 1008 04/20/21 1114  BP:    Pulse:    Resp:    Temp:  36.4 C  SpO2: 93%     Last Pain:  Vitals:   04/20/21 1144  TempSrc:   PainSc: 0-No pain                 Martha Clan

## 2021-04-20 NOTE — Op Note (Addendum)
[facility] Gastroenterology Patient Name: [name] [name] Procedure Date: 04/20/2021 10:43 AM MRN: [id number] Account #: [id number] Date of Birth: [birth year] Admit Type: Outpatient Age: 70 Room: [facility] ENDO ROOM 2 Gender: Male Note Status: Supervisor Override Procedure:             Colonoscopy Indications:           Abdominal pain, Constipation Providers:             Benay Pike. Alice Reichert MD, MD Referring MD:          Elyse Jarvis Revelo (Referring MD) Medicines:             Propofol per Anesthesia Complications:         No immediate complications. Procedure:             Pre-Anesthesia Assessment:                        - The risks and benefits of the procedure and the                         sedation options and risks were discussed with the                         patient. All questions were answered and informed                         consent was obtained.                        - Patient identification and proposed procedure were                         verified prior to the procedure by the nurse. The                         procedure was verified in the procedure room.                        - ASA Grade Assessment: III - A patient with severe                         systemic disease.                        - After reviewing the risks and benefits, the patient                         was deemed in satisfactory condition to undergo the                         procedure.                        After obtaining informed consent, the colonoscope was                         passed under direct vision. Throughout the procedure,                         the patient's blood pressure, pulse, and  oxygen                         saturations were monitored continuously. The                         Colonoscope was introduced through the anus and                         advanced to the the cecum, identified by appendiceal                         orifice and ileocecal valve. The  colonoscopy was                         performed without difficulty. The patient tolerated                         the procedure well. The quality of the bowel                         preparation was good. The ileocecal valve, appendiceal                         orifice, and rectum were photographed. Findings:      The perianal and digital rectal examinations were normal. Pertinent       negatives include normal sphincter tone and no palpable rectal lesions.      The entire examined colon appeared normal on direct and retroflexion       views. Impression:            - The entire examined colon is normal on direct and                         retroflexion views.                        - No specimens collected. Recommendation:        - Patient has a contact number available for                         emergencies. The signs and symptoms of potential                         delayed complications were discussed with the patient.                         Return to normal activities tomorrow. Written                         discharge instructions were provided to the patient.                        - Resume previous diet.                        - Continue present medications.                        - Repeat colonoscopy in 5 years for screening purposes.                        -  Return to GI office in 1 year.                        - The findings and recommendations were discussed with                         the patient. Procedure Code(s):     --- Professional ---                        D3143, Colorectal cancer screening; colonoscopy on                         individual at high risk Diagnosis Code(s):     --- Professional ---                        Z80.0, Family history of malignant neoplasm of                         digestive organs CPT copyright 2019 American Medical Association. All rights reserved. The codes documented in this report are preliminary and upon coder review may  be  revised to meet current compliance requirements. Efrain Sella MD, MD 04/20/2021 11:14:26 AM This report has been signed electronically. Number of Addenda: 0 Note Initiated On: 04/20/2021 10:43 AM Scope Withdrawal Time: 0 hours 7 minutes 32 seconds  Total Procedure Duration: 0 hours 10 minutes 52 seconds  Estimated Blood Loss:  Estimated blood loss: none.      [facility]

## 2021-04-21 ENCOUNTER — Encounter: Payer: Self-pay | Admitting: Internal Medicine

## 2021-08-18 ENCOUNTER — Emergency Department: Payer: Medicare Other

## 2021-08-18 ENCOUNTER — Inpatient Hospital Stay
Admission: EM | Admit: 2021-08-18 | Discharge: 2021-08-20 | DRG: 291 | Disposition: A | Discharge status: Other Acute Inpatient Hospital | Payer: Medicare Other | Attending: Family Medicine | Admitting: Family Medicine

## 2021-08-18 ENCOUNTER — Encounter: Payer: Self-pay | Admitting: Emergency Medicine

## 2021-08-18 ENCOUNTER — Other Ambulatory Visit: Payer: Self-pay

## 2021-08-18 DIAGNOSIS — E871 Hypo-osmolality and hyponatremia: Secondary | ICD-10-CM | POA: Diagnosis present

## 2021-08-18 DIAGNOSIS — Z85118 Personal history of other malignant neoplasm of bronchus and lung: Secondary | ICD-10-CM

## 2021-08-18 DIAGNOSIS — J9622 Acute and chronic respiratory failure with hypercapnia: Secondary | ICD-10-CM | POA: Diagnosis present

## 2021-08-18 DIAGNOSIS — J9601 Acute respiratory failure with hypoxia: Secondary | ICD-10-CM

## 2021-08-18 DIAGNOSIS — Z20822 Contact with and (suspected) exposure to covid-19: Secondary | ICD-10-CM | POA: Diagnosis present

## 2021-08-18 DIAGNOSIS — J869 Pyothorax without fistula: Secondary | ICD-10-CM | POA: Diagnosis present

## 2021-08-18 DIAGNOSIS — E876 Hypokalemia: Secondary | ICD-10-CM | POA: Diagnosis present

## 2021-08-18 DIAGNOSIS — E785 Hyperlipidemia, unspecified: Secondary | ICD-10-CM | POA: Diagnosis present

## 2021-08-18 DIAGNOSIS — Z87891 Personal history of nicotine dependence: Secondary | ICD-10-CM

## 2021-08-18 DIAGNOSIS — R64 Cachexia: Secondary | ICD-10-CM | POA: Diagnosis present

## 2021-08-18 DIAGNOSIS — I110 Hypertensive heart disease with heart failure: Principal | ICD-10-CM | POA: Diagnosis present

## 2021-08-18 DIAGNOSIS — I50811 Acute right heart failure: Secondary | ICD-10-CM | POA: Diagnosis present

## 2021-08-18 DIAGNOSIS — I50810 Right heart failure, unspecified: Secondary | ICD-10-CM | POA: Diagnosis present

## 2021-08-18 DIAGNOSIS — R0602 Shortness of breath: Secondary | ICD-10-CM | POA: Diagnosis not present

## 2021-08-18 DIAGNOSIS — I272 Pulmonary hypertension, unspecified: Secondary | ICD-10-CM | POA: Diagnosis present

## 2021-08-18 DIAGNOSIS — Z9889 Other specified postprocedural states: Secondary | ICD-10-CM

## 2021-08-18 DIAGNOSIS — Z79899 Other long term (current) drug therapy: Secondary | ICD-10-CM

## 2021-08-18 DIAGNOSIS — Z7982 Long term (current) use of aspirin: Secondary | ICD-10-CM

## 2021-08-18 DIAGNOSIS — J9621 Acute and chronic respiratory failure with hypoxia: Secondary | ICD-10-CM | POA: Diagnosis present

## 2021-08-18 DIAGNOSIS — J90 Pleural effusion, not elsewhere classified: Secondary | ICD-10-CM

## 2021-08-18 DIAGNOSIS — Z681 Body mass index (BMI) 19 or less, adult: Secondary | ICD-10-CM

## 2021-08-18 LAB — CBC WITH DIFFERENTIAL/PLATELET
Abs Immature Granulocytes: 0.01 10*3/uL (ref 0.00–0.07)
Basophils Absolute: 0 10*3/uL (ref 0.0–0.1)
Basophils Relative: 0 %
Eosinophils Absolute: 0 10*3/uL (ref 0.0–0.5)
Eosinophils Relative: 0 %
HCT: 47.3 % (ref 39.0–52.0)
Hemoglobin: 15.8 g/dL (ref 13.0–17.0)
Immature Granulocytes: 0 %
Lymphocytes Relative: 13 %
Lymphs Abs: 0.7 10*3/uL (ref 0.7–4.0)
MCH: 28.8 pg (ref 26.0–34.0)
MCHC: 33.4 g/dL (ref 30.0–36.0)
MCV: 86.3 fL (ref 80.0–100.0)
Monocytes Absolute: 0.3 10*3/uL (ref 0.1–1.0)
Monocytes Relative: 5 %
Neutro Abs: 4.4 10*3/uL (ref 1.7–7.7)
Neutrophils Relative %: 82 %
Platelets: 182 10*3/uL (ref 150–400)
RBC: 5.48 MIL/uL (ref 4.22–5.81)
RDW: 12.2 % (ref 11.5–15.5)
WBC: 5.4 10*3/uL (ref 4.0–10.5)
nRBC: 0 % (ref 0.0–0.2)

## 2021-08-18 LAB — COMPREHENSIVE METABOLIC PANEL
ALT: 24 U/L (ref 0–44)
AST: 27 U/L (ref 15–41)
Albumin: 4.3 g/dL (ref 3.5–5.0)
Alkaline Phosphatase: 67 U/L (ref 38–126)
Anion gap: 8 (ref 5–15)
BUN: 13 mg/dL (ref 8–23)
CO2: 32 mmol/L (ref 22–32)
Calcium: 9.3 mg/dL (ref 8.9–10.3)
Chloride: 86 mmol/L — ABNORMAL LOW (ref 98–111)
Creatinine, Ser: 0.82 mg/dL (ref 0.61–1.24)
GFR, Estimated: >60 mL/min (ref >60)
Glucose, Bld: 94 mg/dL (ref 70–99)
Potassium: 4.5 mmol/L (ref 3.5–5.1)
Sodium: 126 mmol/L — ABNORMAL LOW (ref 135–145)
Total Bilirubin: 1.3 mg/dL — ABNORMAL HIGH (ref 0.3–1.2)
Total Protein: 7.4 g/dL (ref 6.5–8.1)

## 2021-08-18 LAB — RESP PANEL BY RT-PCR (FLU A&B, COVID) ARPGX2
Influenza A by PCR: NEGATIVE
Influenza B by PCR: NEGATIVE
SARS Coronavirus 2 by RT PCR: NEGATIVE

## 2021-08-18 LAB — TROPONIN I (HIGH SENSITIVITY): Troponin I (High Sensitivity): 16 ng/L (ref <18)

## 2021-08-18 LAB — D-DIMER, QUANTITATIVE: D-Dimer, Quant: 0.86 ug/mL-FEU — ABNORMAL HIGH (ref 0.00–0.50)

## 2021-08-18 LAB — BRAIN NATRIURETIC PEPTIDE: B Natriuretic Peptide: 189.9 pg/mL — ABNORMAL HIGH (ref 0.0–100.0)

## 2021-08-18 MED ORDER — FUROSEMIDE 10 MG/ML IJ SOLN
40.0000 mg | Freq: Once | INTRAMUSCULAR | Status: AC
  Administered 2021-08-18: 40 mg via INTRAVENOUS
  Filled 2021-08-18: qty 4

## 2021-08-18 NOTE — ED Provider Notes (Signed)
[name]  ____________________________________________   Event Date/Time   First MD Initiated Contact with Patient 08/18/21 1823     (approximate)  I have reviewed the triage vital signs and the nursing notes.   HISTORY  Chief Complaint Shortness of Breath    HPI [name] [name] is a 70 y.o. male past medical history of lung cancer, hypertension, hyperlipidemia who presents with dyspnea.  Over the last several months patient has had progressive dyspnea on exertion.  For the past 2 weeks has had lower extremity edema.  He endorses orthopnea and PND.  Denies fevers, cough, chills.  Has also had abdominal discomfort and bloating.  Patient was seen in cardiology office today and had an echo done that was concerning for pulmonary hypertension and RV failure and was referred to the emergency department for diuresis.     Past Medical History:  Diagnosis Date   Cancer ([place])    lung   Hyperlipidemia    Hypertension     There are no problems to display for this patient.   Past Surgical History:  Procedure Laterality Date   COLONOSCOPY WITH PROPOFOL N/A 04/20/2021   Procedure: COLONOSCOPY WITH PROPOFOL;  Surgeon: Toledo, Benay Pike, MD;  Location: [facility] ENDOSCOPY;  Service: Gastroenterology;  Laterality: N/A;   ESOPHAGOGASTRODUODENOSCOPY N/A 04/20/2021   Procedure: ESOPHAGOGASTRODUODENOSCOPY (EGD);  Surgeon: Toledo, Benay Pike, MD;  Location: [facility] ENDOSCOPY;  Service: Gastroenterology;  Laterality: N/A;   LUNG REMOVAL, PARTIAL      Prior to Admission medications   Medication Sig Start Date End Date Taking? Authorizing Provider  aspirin 81 MG chewable tablet Chew by mouth daily.    [provider]  lisinopril (ZESTRIL) 2.5 MG tablet Take 2.5 mg by mouth daily.    [provider]  Multiple Vitamin (MULTIVITAMIN) capsule Take 1 capsule by mouth daily.    [provider]  pantoprazole (PROTONIX) 40 MG tablet Take 40 mg by mouth daily.     [provider]  simvastatin (ZOCOR) 20 MG tablet Take 20 mg by mouth daily.    [provider]    Allergies Patient has no known allergies.  No family history on file.  Social History Social History   Tobacco Use   Smoking status: Former   Smokeless tobacco: Never  Scientific laboratory technician Use: Never used  Substance Use Topics   Alcohol use: Yes    Comment: rare   Drug use: Not Currently    Review of Systems   Review of Systems  Constitutional:  Positive for fatigue. Negative for chills and fever.  Respiratory:  Positive for shortness of breath. Negative for cough and chest tightness.   Cardiovascular:  Negative for chest pain.  Gastrointestinal:  Positive for abdominal distention and abdominal pain. Negative for nausea and vomiting.  All other systems reviewed and are negative.  Physical Exam Updated Vital Signs BP 128/80   Pulse 93   Temp 97.9 F (36.6 C) (Oral)   Resp 13   Ht _0  (1.753 m)   Wt 57.2 kg   SpO2 99%   BMI 18.61 kg/m   Physical Exam Vitals and nursing note reviewed.  Constitutional:      General: He is not in acute distress.    Appearance: Normal appearance.     Comments: Cachectic  HENT:     Head: Normocephalic and atraumatic.  Eyes:     General: No scleral icterus.    Conjunctiva/sclera: Conjunctivae normal.  Neck:  Vascular: JVD present.  Pulmonary:     Effort: Pulmonary effort is normal. No respiratory distress.     Breath sounds: Normal breath sounds. No wheezing.     Comments: Mild dyspnea when speaking Musculoskeletal:        General: No deformity or signs of injury.     Cervical back: Normal range of motion.     Right lower leg: Edema present.     Left lower leg: Edema present.     Comments: 2+ pitting edema in the bilateral lower extremities  Skin:    Coloration: Skin is not jaundiced or pale.  Neurological:     General: No focal deficit present.     Mental Status: He is alert and oriented to [place],  place, and time. Mental status is at baseline.  Psychiatric:        Mood and Affect: Mood normal.        Behavior: Behavior normal.     LABS (all labs ordered are listed, but only abnormal results are displayed)  Labs Reviewed  BRAIN NATRIURETIC PEPTIDE - Abnormal; Notable for the following components:      Result Value   B Natriuretic Peptide 189.9 (*)    All other components within normal limits  COMPREHENSIVE METABOLIC PANEL - Abnormal; Notable for the following components:   Sodium 126 (*)    Chloride 86 (*)    Total Bilirubin 1.3 (*)    All other components within normal limits  RESP PANEL BY RT-PCR (FLU A&B, COVID) ARPGX2  CBC WITH DIFFERENTIAL/PLATELET  TROPONIN I (HIGH SENSITIVITY)  TROPONIN I (HIGH SENSITIVITY)   ____________________________________________  EKG  Normal sinus rhythm, normal axis, poor R wave progression, no acute ischemic changes ____________________________________________  RADIOLOGY Almeta Monas, personally viewed and evaluated these images (plain radiographs) as part of my medical decision making, as well as reviewing the written report by the radiologist.  ED MD interpretation: I reviewed the chest x-ray which shows whiteout of the left hemithorax as expected after left pneumonectomy with mediastinal shift, right lung without infiltrate or fluid    ____________________________________________   PROCEDURES  Procedure(s) performed (including Critical Care):  Procedures   ____________________________________________   INITIAL IMPRESSION / ASSESSMENT AND PLAN / ED COURSE     70 year old male presents with progressive dyspnea on exertion, lower extremity swelling.  Here he is initially satting 83%, requiring 3 L nasal cannula.  Patient appears frail and is grossly volume overloaded with JVD and lower extremity swelling.  He is in mild respiratory distress but not requiring noninvasive positive pressure.  Echo done at cardiology  today consistent with pulmonary hypertension.  I suspect that he is having RV failure with volume overload and would benefit from diuresis.  His labs are notable for hyponatremia to 126 which I suspect is hypervolemic hyponatremia and will respond to diuresis.  Otherwise his BNP is mildly elevated, troponin of 16.  EKG is nonischemic.  Discussed with hospitalist who will admit the patient.  Clinical Course as of 08/18/21 2208  Thu Aug 18, 2021  2102 B Natriuretic Peptide(!): 189.9 [KM]    Clinical Course User Index [KM] Rada Hay, MD     ____________________________________________   FINAL CLINICAL IMPRESSION(S) / ED DIAGNOSES  Final diagnoses:  Acute respiratory failure with hypoxia [facility])  Pulmonary hypertension [facility])     ED Discharge Orders     None        Note:  This document was prepared using Dragon voice recognition software  and may include unintentional dictation errors.    Rada Hay, MD 08/18/21 [phone]

## 2021-08-18 NOTE — ED Notes (Signed)
X-Ray at bedside.

## 2021-08-18 NOTE — ED Triage Notes (Signed)
C/O abdominal bloating, SOB, orthopnea for 'a while'.  Denies c/o CP  AAOx3.  Skin warm and dry. No SOB/ DOE

## 2021-08-18 NOTE — ED Notes (Signed)
Placed on 2l/ [name] [name] in Triage, Sats improved to 97%

## 2021-08-18 NOTE — ED Provider Notes (Signed)
Emergency Medicine Provider Triage Evaluation Note  [place] , a 70 y.o. male  was evaluated in triage.  Pt complains of shortness of breath, LE edema, abdominal bloating.   Review of Systems  Positive: DOE, LE swelling Negative: CP, fever, chills   Physical Exam  There were no vitals taken for this visit. Gen:   Awake, no distress   Resp:  Mildly increased WOB MSK:   +LE edema BL Other:    Medical Decision Making  Medically screening exam initiated at 6:08 PM.  Appropriate orders placed.  Birch Christenberry was informed that the remainder of the evaluation will be completed by another provider, this initial triage assessment does not replace that evaluation, and the importance of remaining in the ED until their evaluation is complete.     Rada Hay, MD 08/18/21 [phone]

## 2021-08-18 NOTE — H&P (Addendum)
History and Physical    [name] [name] KJZ:[id number] DOB: 1951/03/11 DOA: 08/18/2021  PCP: Patient, No Pcp Per (Inactive)  Patient coming from: cardiology office  I have personally briefly reviewed patient's old medical records in [place]  Chief Complaint: sob/doe  HPI: [name] [name] is a 70 y.o. 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 On evaluation at cardiologist office new patient appointment,patient found to have severe DOE and SOB/ echo noted severe pulhtn and acute right sided heart failure. Patient  referred to ED for admission. Per patient states he has had symptom for around 6 months, he has noted since then sob/doe has worsened and especially so in the last 2-3 weeks. He also noted swelling in  b/l lower extremities. He notes no recent infections/hospitalization and denies history of heart failure in the past.    ED Course:  Afeb, bp 153/107, hr 97, sat 83% o ra  Labs: wbcL 5.4, hgb 15.8, plt 182,  CMP NA126 (141 remote) cr0.82 CE16 Bnp 189 Covid:negative Cxr: The heart and mediastinal contours are within normal limits.   Surgical changes related to left pneumonectomy with complete opacification of the left hemithorax. No focal consolidation. No pulmonary edema. No pleural effusion. No pneumothorax.   No acute osseous abnormality.  Tx lasix 32m   D-dimer 0.86 Review of Systems: As per HPI otherwise 10 point review of systems negative.   Past Medical History:  Diagnosis Date   Cancer ([facility]    lung   Hyperlipidemia    Hypertension     Past Surgical History:  Procedure Laterality Date   COLONOSCOPY WITH PROPOFOL N/A 04/20/2021   Procedure: COLONOSCOPY WITH PROPOFOL;  Surgeon: Toledo, TBenay Pike MD;  Location:  [facility] ENDOSCOPY;  Service: Gastroenterology;  Laterality: N/A;   ESOPHAGOGASTRODUODENOSCOPY N/A 04/20/2021   Procedure: ESOPHAGOGASTRODUODENOSCOPY (EGD);  Surgeon: Toledo, TBenay Pike MD;  Location: [facility] ENDOSCOPY;  Service: Gastroenterology;  Laterality: N/A;   LUNG REMOVAL, PARTIAL       reports that he has quit smoking. He has never used smokeless tobacco. He reports current alcohol use. He reports that he does not currently use drugs.  No Known Allergies  No family history on file. Notes none  Prior to Admission medications   Medication Sig Start Date End Date Taking? Authorizing Provider  aspirin 81 MG chewable tablet Chew by mouth daily.    [provider]  lisinopril (ZESTRIL) 2.5 MG tablet Take 2.5 mg by mouth daily.    [provider]  Multiple Vitamin (MULTIVITAMIN) capsule Take 1 capsule by mouth daily.    [provider]  pantoprazole (PROTONIX) 40 MG tablet Take 40 mg by mouth daily.    [provider]  simvastatin (ZOCOR) 20 MG tablet Take 20 mg by mouth daily.    [provider]    Physical Exam: Vitals:   08/18/21 1830 08/18/21 1931 08/18/21 2135 08/18/21 2258  BP: (!) 179/111 (!) 171/111 128/80 (!) 162/129  Pulse: 97 100 93 95  Resp: (!) 24 (!) 22 13 (!) 25  Temp:  97.9 F (36.6 C)    TempSrc:  Oral    SpO2: 90% 94% 99% 100%  Weight:      Height:         Vitals:   08/18/21 1830 08/18/21 1931 08/18/21 2135 08/18/21 2258  BP: (!) 179/111 (!) 171/111 128/80 (!) 162/129  Pulse: 97 100 93 95  Resp: (!) 24 (!) 22 13 (!) 25  Temp:  97.9 F (36.6 C)    TempSrc:  Oral    SpO2: 90% 94% 99% 100%  Weight:      Height:      Constitutional: NAD, calm, comfortable Eyes: PERRL, lids and conjunctivae normal ENMT: Mucous membranes are moist. Posterior pharynx clear of any exudate or lesions.Normal dentition.  Neck: normal, supple, no masses, no thyromegaly Respiratory: clear to auscultation , no wheezing, no crackles. Normal  respiratory effort. No accessory muscle use.  Cardiovascular: Regular rate and rhythm, no murmurs / rubs / gallops. +extremity edema. 2+ pedal pulses. No carotid bruits.  Abdomen: no tenderness, no masses palpated. No hepatosplenomegaly. Bowel sounds positive.  Musculoskeletal: no clubbing / cyanosis. No joint deformity upper and lower extremities. Good ROM, no contractures. Normal muscle tone.  Skin: no rashes, lesions, ulcers. No induration Neurologic: CN 2-12 grossly intact. Sensation intact, DTR normal. Strength 5/5 in all 4.  Psychiatric: Normal judgment and insight. Alert and oriented x 3. Normal mood.    Labs on Admission: I have personally reviewed following labs and imaging studies  CBC: Recent Labs  Lab 08/18/21 1941  WBC 5.4  NEUTROABS 4.4  HGB 15.8  HCT 47.3  MCV 86.3  PLT 573   Basic Metabolic Panel: Recent Labs  Lab 08/18/21 1941  NA 126*  K 4.5  CL 86*  CO2 32  GLUCOSE 94  BUN 13  CREATININE 0.82  CALCIUM 9.3   GFR: Estimated Creatinine Clearance: 70.7 mL/min (by C-G formula based on SCr of 0.82 mg/dL). Liver Function Tests: Recent Labs  Lab 08/18/21 1941  AST 27  ALT 24  ALKPHOS 67  BILITOT 1.3*  PROT 7.4  ALBUMIN 4.3   No results for input(s): LIPASE, AMYLASE in the last 168 hours. No results for input(s): AMMONIA in the last 168 hours. Coagulation Profile: No results for input(s): INR, PROTIME in the last 168 hours. Cardiac Enzymes: No results for input(s): CKTOTAL, CKMB, CKMBINDEX, TROPONINI in the last 168 hours. BNP (last 3 results) No results for input(s): PROBNP in the last 8760 hours. HbA1C: No results for input(s): HGBA1C in the last 72 hours. CBG: No results for input(s): GLUCAP in the last 168 hours. Lipid Profile: No results for input(s): CHOL, HDL, LDLCALC, TRIG, CHOLHDL, LDLDIRECT in the last 72 hours. Thyroid Function Tests: No results for input(s): TSH, T4TOTAL, FREET4, T3FREE, THYROIDAB in the last 72 hours. Anemia  Panel: No results for input(s): VITAMINB12, FOLATE, FERRITIN, TIBC, IRON, RETICCTPCT in the last 72 hours. Urine analysis: No results found for: COLORURINE, APPEARANCEUR, [place], GLUCOSEU, HGBUR, BILIRUBINUR, KETONESUR, PROTEINUR, UROBILINOGEN, NITRITE, LEUKOCYTESUR  Radiological Exams on Admission: DG Chest Port 1 View  Result Date: 08/18/2021 CLINICAL DATA:  Shortness of breath. Abdominal bloating. Orthopnea. EXAM: PORTABLE CHEST 1 VIEW COMPARISON:  Chest x-ray 09/25/2014, CT angio chest 09/25/2014 FINDINGS: The heart and mediastinal contours are within normal limits. Surgical changes related to left pneumonectomy with complete opacification of the left hemithorax. No focal consolidation. No pulmonary edema. No pleural effusion. No pneumothorax. No acute osseous abnormality. IMPRESSION: Acute cardiopulmonary abnormality in a patient status post left pneumonectomy. Electronically Signed   By: Iven Finn M.D.   On: 08/18/2021 19:47    EKG: Independently reviewed. UKG:[id number] t wave abn in later leads  Assessment/Plan  New diagnosis of Severe Pulmonary hypertension  Acute Right-sided heart failure   Acute  hypoxic respiratory failure due pulmonary HTN -admit to med tele  - placed on CHF protocol  -gently iv diuresis as requested by cardiology -cardiology consult  - consider pulmonary consult  -wean O2 as able   Hyponatremia  -possibly chronic , last lab in system 2015  -check serum osmo, urine NA, tsh  -possibly related to SIADH from complicated lung history vs hypervolemic state -will give trial of fluid restriction   Hx of lung ca s/p pneumonectomy on left  -no active issues  -full CT pending   +D-dimer -CTPA pending   Uncontrolled HTN  -resume home medications with carvedilol, lisinopril   HLD -continue statin   Hypokalemia -replete prn   DVT prophylaxis: lwmh Code Status: full Family Communication: n/a Disposition Plan: patient  expected to be  admitted greater than 2 midnights  Consults called: [place] , cardiology  Admission status: inpatient    Clance Boll MD [facility]   If 7PM-7AM, please contact night-coverage [email or web address] Password [facility]  08/18/2021, 11:37 PM

## 2021-08-18 NOTE — ED Notes (Signed)
Pt was tested twice with no O2. Pt dropped x2 into the 80%. Pt back on 2l

## 2021-08-19 ENCOUNTER — Inpatient Hospital Stay: Payer: Medicare Other

## 2021-08-19 DIAGNOSIS — I270 Primary pulmonary hypertension: Secondary | ICD-10-CM | POA: Diagnosis not present

## 2021-08-19 DIAGNOSIS — J9612 Chronic respiratory failure with hypercapnia: Secondary | ICD-10-CM | POA: Diagnosis not present

## 2021-08-19 DIAGNOSIS — R609 Edema, unspecified: Secondary | ICD-10-CM | POA: Diagnosis not present

## 2021-08-19 DIAGNOSIS — E876 Hypokalemia: Secondary | ICD-10-CM | POA: Diagnosis present

## 2021-08-19 DIAGNOSIS — I50810 Right heart failure, unspecified: Secondary | ICD-10-CM | POA: Diagnosis present

## 2021-08-19 DIAGNOSIS — R1013 Epigastric pain: Secondary | ICD-10-CM | POA: Diagnosis not present

## 2021-08-19 DIAGNOSIS — Z87891 Personal history of nicotine dependence: Secondary | ICD-10-CM | POA: Diagnosis not present

## 2021-08-19 DIAGNOSIS — Z20822 Contact with and (suspected) exposure to covid-19: Secondary | ICD-10-CM | POA: Diagnosis present

## 2021-08-19 DIAGNOSIS — Q399 Congenital malformation of esophagus, unspecified: Secondary | ICD-10-CM | POA: Diagnosis not present

## 2021-08-19 DIAGNOSIS — R579 Shock, unspecified: Secondary | ICD-10-CM | POA: Diagnosis not present

## 2021-08-19 DIAGNOSIS — I2721 Secondary pulmonary arterial hypertension: Secondary | ICD-10-CM | POA: Diagnosis not present

## 2021-08-19 DIAGNOSIS — J9602 Acute respiratory failure with hypercapnia: Secondary | ICD-10-CM | POA: Diagnosis not present

## 2021-08-19 DIAGNOSIS — R0602 Shortness of breath: Secondary | ICD-10-CM | POA: Diagnosis present

## 2021-08-19 DIAGNOSIS — E871 Hypo-osmolality and hyponatremia: Secondary | ICD-10-CM | POA: Diagnosis present

## 2021-08-19 DIAGNOSIS — J432 Centrilobular emphysema: Secondary | ICD-10-CM | POA: Diagnosis not present

## 2021-08-19 DIAGNOSIS — I272 Pulmonary hypertension, unspecified: Secondary | ICD-10-CM | POA: Diagnosis present

## 2021-08-19 DIAGNOSIS — Z681 Body mass index (BMI) 19 or less, adult: Secondary | ICD-10-CM | POA: Diagnosis not present

## 2021-08-19 DIAGNOSIS — Z85118 Personal history of other malignant neoplasm of bronchus and lung: Secondary | ICD-10-CM | POA: Diagnosis not present

## 2021-08-19 DIAGNOSIS — J869 Pyothorax without fistula: Secondary | ICD-10-CM | POA: Diagnosis present

## 2021-08-19 DIAGNOSIS — I110 Hypertensive heart disease with heart failure: Secondary | ICD-10-CM | POA: Diagnosis present

## 2021-08-19 DIAGNOSIS — J90 Pleural effusion, not elsewhere classified: Secondary | ICD-10-CM

## 2021-08-19 DIAGNOSIS — R64 Cachexia: Secondary | ICD-10-CM | POA: Diagnosis present

## 2021-08-19 DIAGNOSIS — J9601 Acute respiratory failure with hypoxia: Secondary | ICD-10-CM | POA: Diagnosis not present

## 2021-08-19 DIAGNOSIS — E785 Hyperlipidemia, unspecified: Secondary | ICD-10-CM | POA: Diagnosis present

## 2021-08-19 DIAGNOSIS — Z79899 Other long term (current) drug therapy: Secondary | ICD-10-CM | POA: Diagnosis not present

## 2021-08-19 DIAGNOSIS — J9622 Acute and chronic respiratory failure with hypercapnia: Secondary | ICD-10-CM | POA: Diagnosis present

## 2021-08-19 DIAGNOSIS — J9621 Acute and chronic respiratory failure with hypoxia: Secondary | ICD-10-CM | POA: Diagnosis present

## 2021-08-19 DIAGNOSIS — Z452 Encounter for adjustment and management of vascular access device: Secondary | ICD-10-CM | POA: Diagnosis not present

## 2021-08-19 DIAGNOSIS — J9611 Chronic respiratory failure with hypoxia: Secondary | ICD-10-CM | POA: Diagnosis not present

## 2021-08-19 DIAGNOSIS — R1310 Dysphagia, unspecified: Secondary | ICD-10-CM | POA: Diagnosis not present

## 2021-08-19 DIAGNOSIS — Z9889 Other specified postprocedural states: Secondary | ICD-10-CM | POA: Diagnosis not present

## 2021-08-19 DIAGNOSIS — Z7982 Long term (current) use of aspirin: Secondary | ICD-10-CM | POA: Diagnosis not present

## 2021-08-19 DIAGNOSIS — I50811 Acute right heart failure: Secondary | ICD-10-CM | POA: Diagnosis present

## 2021-08-19 LAB — OSMOLALITY: Osmolality: 276 mOsm/kg (ref 275–295)

## 2021-08-19 LAB — BLOOD GAS, ARTERIAL
Acid-Base Excess: 16.1 mmol/L — ABNORMAL HIGH (ref 0.0–2.0)
Acid-Base Excess: 12.9 mmol/L — ABNORMAL HIGH (ref 0.0–2.0)
Bicarbonate: 46.9 mmol/L — ABNORMAL HIGH (ref 20.0–28.0)
Bicarbonate: 44.2 mmol/L — ABNORMAL HIGH (ref 20.0–28.0)
FIO2: 36
FIO2: 36
O2 Saturation: 91.2 %
O2 Saturation: 99.4 %
Patient temperature: 37
Patient temperature: 37
pCO2 arterial: 89 mmHg (ref 32.0–48.0)
pCO2 arterial: 94 mmHg (ref 32.0–48.0)
pH, Arterial: 7.33 — ABNORMAL LOW (ref 7.350–7.450)
pH, Arterial: 7.28 — ABNORMAL LOW (ref 7.350–7.450)
pO2, Arterial: 66 mmHg — ABNORMAL LOW (ref 83.0–108.0)
pO2, Arterial: 168 mmHg — ABNORMAL HIGH (ref 83.0–108.0)

## 2021-08-19 LAB — CBC
HCT: 42 % (ref 39.0–52.0)
Hemoglobin: 14 g/dL (ref 13.0–17.0)
MCH: 29.1 pg (ref 26.0–34.0)
MCHC: 33.3 g/dL (ref 30.0–36.0)
MCV: 87.3 fL (ref 80.0–100.0)
Platelets: 158 10*3/uL (ref 150–400)
RBC: 4.81 MIL/uL (ref 4.22–5.81)
RDW: 12.1 % (ref 11.5–15.5)
WBC: 4.9 10*3/uL (ref 4.0–10.5)
nRBC: 0 % (ref 0.0–0.2)

## 2021-08-19 LAB — TROPONIN I (HIGH SENSITIVITY): Troponin I (High Sensitivity): 19 ng/L — ABNORMAL HIGH (ref <18)

## 2021-08-19 LAB — BODY FLUID CELL COUNT WITH DIFFERENTIAL

## 2021-08-19 LAB — CREATININE, SERUM
Creatinine, Ser: 0.9 mg/dL (ref 0.61–1.24)
GFR, Estimated: >60 mL/min (ref >60)

## 2021-08-19 LAB — LACTATE DEHYDROGENASE, PLEURAL OR PERITONEAL FLUID: LD, Fluid: 935 U/L — ABNORMAL HIGH (ref 3–23)

## 2021-08-19 LAB — GLUCOSE, PLEURAL OR PERITONEAL FLUID: Glucose, Fluid: 28 mg/dL

## 2021-08-19 LAB — HIV ANTIBODY (ROUTINE TESTING W REFLEX): HIV Screen 4th Generation wRfx: NONREACTIVE

## 2021-08-19 MED ORDER — ASPIRIN 81 MG PO CHEW
81.0000 mg | CHEWABLE_TABLET | Freq: Every day | ORAL | Status: DC
  Administered 2021-08-19: 81 mg via ORAL
  Filled 2021-08-19: qty 1

## 2021-08-19 MED ORDER — PIPERACILLIN-TAZOBACTAM 3.375 G IVPB
3.3750 g | Freq: Three times a day (TID) | INTRAVENOUS | Status: DC
  Administered 2021-08-19 (×2): 3.375 g via INTRAVENOUS
  Filled 2021-08-19 (×2): qty 50

## 2021-08-19 MED ORDER — ENOXAPARIN SODIUM 40 MG/0.4ML IJ SOSY
40.0000 mg | PREFILLED_SYRINGE | INTRAMUSCULAR | Status: DC
  Administered 2021-08-19: 40 mg via SUBCUTANEOUS
  Filled 2021-08-19: qty 0.4

## 2021-08-19 MED ORDER — PIPERACILLIN-TAZOBACTAM 3.375 G IVPB 30 MIN: 3.3750 g | Freq: Four times a day (QID) | INTRAVENOUS | Status: DC

## 2021-08-19 MED ORDER — IOHEXOL 350 MG/ML SOLN
60.0000 mL | Freq: Once | INTRAVENOUS | Status: AC | PRN
  Administered 2021-08-19: 60 mL via INTRAVENOUS
  Filled 2021-08-19: qty 60

## 2021-08-19 MED ORDER — SIMVASTATIN 10 MG PO TABS
20.0000 mg | ORAL_TABLET | Freq: Every day | ORAL | Status: DC
  Filled 2021-08-19: qty 2

## 2021-08-19 MED ORDER — ONDANSETRON HCL 4 MG/2ML IJ SOLN: 4.0000 mg | Freq: Four times a day (QID) | INTRAMUSCULAR | Status: DC | PRN

## 2021-08-19 MED ORDER — ACETAMINOPHEN 325 MG PO TABS: 650.0000 mg | ORAL_TABLET | ORAL | Status: DC | PRN

## 2021-08-19 MED ORDER — PANTOPRAZOLE SODIUM 40 MG PO TBEC
40.0000 mg | DELAYED_RELEASE_TABLET | Freq: Every day | ORAL | Status: DC
  Administered 2021-08-19: 40 mg via ORAL
  Filled 2021-08-19: qty 1

## 2021-08-19 MED ORDER — MIDODRINE HCL 5 MG PO TABS: 10.0000 mg | ORAL_TABLET | Freq: Three times a day (TID) | ORAL | Status: DC

## 2021-08-19 MED ORDER — SODIUM CHLORIDE 0.9 % IV BOLUS
1000.0000 mL | Freq: Once | INTRAVENOUS | Status: AC
  Administered 2021-08-19: 1000 mL via INTRAVENOUS

## 2021-08-19 MED ORDER — IPRATROPIUM-ALBUTEROL 0.5-2.5 (3) MG/3ML IN SOLN: 3.0000 mL | Freq: Four times a day (QID) | RESPIRATORY_TRACT | Status: DC | PRN

## 2021-08-19 MED ORDER — SODIUM CHLORIDE 0.9% FLUSH
3.0000 mL | Freq: Two times a day (BID) | INTRAVENOUS | Status: DC
  Administered 2021-08-19 (×2): 3 mL via INTRAVENOUS

## 2021-08-19 MED ORDER — SODIUM CHLORIDE 0.9% FLUSH: 3.0000 mL | INTRAVENOUS | Status: DC | PRN

## 2021-08-19 MED ORDER — HYDRALAZINE HCL 20 MG/ML IJ SOLN: 5.0000 mg | Freq: Four times a day (QID) | INTRAMUSCULAR | Status: DC | PRN

## 2021-08-19 MED ORDER — LISINOPRIL 10 MG PO TABS
20.0000 mg | ORAL_TABLET | Freq: Every day | ORAL | Status: DC
  Filled 2021-08-19: qty 2

## 2021-08-19 MED ORDER — PIPERACILLIN-TAZOBACTAM 3.375 G IVPB: 3.3750 g | Freq: Three times a day (TID) | INTRAVENOUS | Status: DC

## 2021-08-19 MED ORDER — CARVEDILOL 6.25 MG PO TABS
3.1250 mg | ORAL_TABLET | Freq: Two times a day (BID) | ORAL | Status: DC
  Filled 2021-08-19: qty 1

## 2021-08-19 MED ORDER — NOREPINEPHRINE 4 MG/250ML-% IV SOLN
0.0000 ug/min | INTRAVENOUS | Status: DC
  Administered 2021-08-19: 2 ug/min via INTRAVENOUS
  Filled 2021-08-19: qty 250

## 2021-08-19 MED ORDER — FUROSEMIDE 10 MG/ML IJ SOLN
20.0000 mg | Freq: Two times a day (BID) | INTRAMUSCULAR | Status: DC
  Filled 2021-08-19: qty 4

## 2021-08-19 MED ORDER — SODIUM CHLORIDE 0.9 % IV SOLN: 250.0000 mL | INTRAVENOUS | Status: DC | PRN

## 2021-08-19 MED ORDER — POTASSIUM CHLORIDE 20 MEQ PO PACK: 40.0000 meq | PACK | Freq: Two times a day (BID) | ORAL | Status: DC

## 2021-08-19 MED ORDER — FLUTICASONE FUROATE-VILANTEROL 100-25 MCG/INH IN AEPB
1.0000 | INHALATION_SPRAY | Freq: Every day | RESPIRATORY_TRACT | Status: DC
  Filled 2021-08-19: qty 28

## 2021-08-19 MED ORDER — HYDROCODONE-ACETAMINOPHEN 5-325 MG PO TABS: 1.0000 | ORAL_TABLET | ORAL | Status: DC | PRN

## 2021-08-19 NOTE — ED Notes (Signed)
Pt reporting taken home medication

## 2021-08-19 NOTE — ED Notes (Signed)
IR at bedside to perform thoracentesis

## 2021-08-19 NOTE — ED Notes (Signed)
Provider messaged regarding pt's blood pressure

## 2021-08-19 NOTE — Evaluation (Signed)
Occupational Therapy Evaluation Patient Details Name: [name] [name] MRN: [id number] DOB: 07-28-1951 Today's Date: 08/19/2021    History of Present Illness 70 y.o. 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 On evaluation at cardiologist office new patient appointment,patient found to have severe DOE and SOB/ echo noted severe pulhtn and acute right sided heart failure. Patient  referred to ED for admission. Per patient states he has had symptom for around 6 months, he has noted since then sob/doe has worsened and especially so in the last 2-3 weeks. He also noted swelling in  b/l lower extremities   Clinical Impression   Patient presenting with decreased Ind in self care, balance, functional mobility/transfers, endurance, and safety awreness.Patient reports living at home alone without use AD and being fully independent in self care and functional mobility PTA. Pt does report having a daughter nearby that works during the day that can check in on him at discharge. Pt O2 saturation at 70% on 2L when entering the room and increased to 3L's and able to recover quickly with cuing for pursed lip breathing. RN notified. Pt donning socks and standing to take several steps from bed with min guard. OT does recommend RW to increase support. Patient currently functioning at min guard- min A.  Patient will benefit from acute OT to increase overall independence in the areas of ADLs, functional mobility, and safety awareness in order to safely discharge to home.     Follow Up Recommendations  Home health OT;Supervision - Intermittent    Equipment Recommendations  3 in 1 bedside commode;Other (comment) (RW)       Precautions / Restrictions Precautions Precautions:  Fall Precaution Comments: monitor O2      Mobility Bed Mobility Overal bed mobility: Needs Assistance Bed Mobility: Supine to Sit;Sit to Supine     Supine to sit: Min assist;Min guard Sit to supine: Min guard   General bed mobility comments: close supervision for balance to EOB. Min cuing for technique and hand placement    Transfers Overall transfer level: Needs assistance Equipment used: 1 [place] hand held assist Transfers: Sit to/from [facility] Sit to Stand: Min guard Stand pivot transfers: Min guard            Balance Overall balance assessment: Needs assistance Sitting-balance support: Feet supported;Bilateral upper extremity supported Sitting balance-[name] Scale: Good     Standing balance support: Single extremity supported;During functional activity Standing balance-[name] Scale: Fair Standing balance comment: UE support                           ADL either performed or assessed with clinical judgement   ADL Overall ADL's : Needs assistance/impaired                                       General ADL Comments: Pt dons hospital gown will seated EOB with min guard for balance. Min A to don B socks.     Vision Baseline Vision/History: 1 Wears glasses Patient Visual Report: No change from baseline              Pertinent Vitals/Pain Pain Assessment: No/denies pain     Hand Dominance Right  Extremity/Trunk Assessment Upper Extremity Assessment Upper Extremity Assessment: Overall WFL for tasks assessed;Generalized weakness   Lower Extremity Assessment Lower Extremity Assessment: Overall WFL for tasks assessed;Generalized weakness   Cervical / Trunk Assessment Cervical / Trunk Assessment: Normal   Communication Communication Communication: No difficulties   Cognition Arousal/Alertness: Awake/alert Behavior During Therapy: WFL for tasks assessed/performed Overall Cognitive Status: Within Functional  Limits for tasks assessed                                 General Comments: A &O x4. Pt very pleasant and cooperative.              Home Living Family/patient expects to be discharged to:: Private residence Living Arrangements: Alone Available Help at Discharge: Available PRN/intermittently;Family;Other (Comment) (daughter) Type of Home: House Home Access: Stairs to enter [employer] of Steps: 2-3 Entrance Stairs-Rails: Right Home Layout: One level     Bathroom Shower/Tub: Tub/shower unit         Home Equipment: None          Prior Functioning/Environment Level of Independence: Independent        Comments: Pt reports living alone and is Ind in all aspects of care and mobility. He has a daughter nearby that works but checks in on him.        OT Problem List: Decreased strength;Decreased activity tolerance;Decreased knowledge of use of DME or AE;Cardiopulmonary status limiting activity;Decreased safety awareness;Decreased coordination;Impaired balance (sitting and/or standing);Decreased knowledge of precautions      OT Treatment/Interventions: Self-care/ADL training;Therapeutic exercise;Neuromuscular education;Manual therapy;Therapeutic activities;Patient/family education;DME and/or AE instruction;Energy conservation;Modalities;Balance training    OT Goals(Current goals can be found in the care plan section) Acute Rehab OT Goals Patient Stated Goal: to go home OT Goal Formulation: With patient Time For Goal Achievement: 09/02/21 Potential to Achieve Goals: Good ADL Goals Pt Will Perform Grooming: (P) with modified independence Pt Will Perform Lower Body Dressing: (P) with modified independence Pt Will Transfer to Toilet: (P) with modified independence Pt Will Perform Toileting - Clothing Manipulation and hygiene: (P) with modified independence  OT Frequency: Min 2X/week   Barriers to D/C:    none known at this time          AM-PAC  OT "6 Clicks" Daily Activity     Outcome Measure Help from another [place] eating meals?: None Help from another [place] taking care of personal grooming?: None Help from another [place] toileting, which includes using toliet, bedpan, or urinal?: A Little Help from another [place] bathing (including washing, rinsing, drying)?: A Little Help from another [place] to put on and taking off regular upper body clothing?: None Help from another [place] to put on and taking off regular lower body clothing?: A Little 6 Click Score: 21   End of Session Equipment Utilized During Treatment: Oxygen Nurse Communication: Mobility status;Other (comment) (O2 desaturation)  Activity Tolerance: Patient limited by fatigue Patient left: in bed;with call bell/phone within reach;with bed alarm set  OT Visit Diagnosis: Unsteadiness on feet (R26.81);Muscle weakness (generalized) (M62.81)                Time: 2683-4196 OT Time Calculation (min): 22 min Charges:  OT General Charges $OT Visit: 1 Visit OT Evaluation $OT Eval Moderate Complexity: 1 Mod OT Treatments $Therapeutic Activity: 8-22 mins  Darleen Crocker, MS, OTR/L , CBIS ascom [phone]  08/19/21, 1:33 PM

## 2021-08-19 NOTE — Progress Notes (Signed)
Patient moved to ED room 1, report given to accepting RN at bedside.

## 2021-08-19 NOTE — Consult Note (Signed)
Pulmonary Medicine          Date: 08/19/2021,   MRN# [id number] [name] [name] Apr 22, 1951     AdmissionWeight: 57.2 kg                 CurrentWeight: 57.2 kg   Referring physician: Dr. Marcello Moores   CHIEF COMPLAINT:   Acute on chronic hypoxemic respiratory failure with right heart strain   HISTORY OF PRESENT ILLNESS   This is a pleasant 70 year old male with a history of lung cancer, dyslipidemia and essential hypertension.  He came in with chief complaint of shortness of breath and worsening lower extremity edema.  Status post evaluation by cardiology on outpatient with findings of possible pulmonary hypertension and right-sided heart failure.  Patient shares symptoms of been progressive over the last 6 months.  On arrival in the ER he was found to be in A. fib with reduced SPO2 at 83% hyponatremia at 126 was found to be COVID-negative BNP was at 189.  CT chest with large left-sided pleural effusion with mass-effect and mediastinal shift to the right  .Pulmonary consultation was called due to acute on chronic hypoxemic respiratory failure.     PAST MEDICAL HISTORY   Past Medical History:  Diagnosis Date   Cancer [facility])    lung   Hyperlipidemia    Hypertension      SURGICAL HISTORY   Past Surgical History:  Procedure Laterality Date   COLONOSCOPY WITH PROPOFOL N/A 04/20/2021   Procedure: COLONOSCOPY WITH PROPOFOL;  Surgeon: Toledo, Benay Pike, MD;  Location: [facility] ENDOSCOPY;  Service: Gastroenterology;  Laterality: N/A;   ESOPHAGOGASTRODUODENOSCOPY N/A 04/20/2021   Procedure: ESOPHAGOGASTRODUODENOSCOPY (EGD);  Surgeon: Toledo, Benay Pike, MD;  Location: [facility] ENDOSCOPY;  Service: Gastroenterology;  Laterality: N/A;   LUNG REMOVAL, PARTIAL       FAMILY HISTORY   No family history on file.   SOCIAL HISTORY   Social History   Tobacco Use   Smoking status: Former   Smokeless tobacco: Never  Scientific laboratory technician Use: Never used  Substance Use Topics   Alcohol use:  Yes    Comment: rare   Drug use: Not Currently     MEDICATIONS    Home Medication:  Current Outpatient Rx   Order #: [id number] Class: Historical Med   Order #: [id number] Class: Historical Med   Order #: [id number] Class: Historical Med   Order #: [id number] Class: Historical Med   Order #: [id number] Class: Historical Med   Order #: [id number] Class: Historical Med   Order #: [id number] Class: Historical Med   Order #: [id number] Class: Historical Med   Order #: [id number] Class: Historical Med   Order #: [id number] Class: Historical Med   Order #: [id number] Class: Historical Med   Order #: [id number] Class: Historical Med   Order #: [id number] Class: Historical Med    Current Medication:  Current Facility-Administered Medications:    0.9 %  sodium chloride infusion, 250 mL, Intravenous, PRN, Clance Boll, MD   acetaminophen (TYLENOL) tablet 650 mg, 650 mg, Oral, Q4H PRN, Clance Boll, MD   aspirin chewable tablet 81 mg, 81 mg, Oral, Daily, Myles Rosenthal A, MD, 81 mg at 08/19/21 1009   carvedilol (COREG) tablet 3.125 mg, 3.125 mg, Oral, BID WC, Myles Rosenthal A, MD   enoxaparin (LOVENOX) injection 40 mg, 40 mg, Subcutaneous, Q24H, Myles Rosenthal A, MD, 40 mg at 08/19/21 1009   furosemide (LASIX) injection 20 mg, 20 mg, Intravenous, BID, Myles Rosenthal A, MD   hydrALAZINE (APRESOLINE) injection 5 mg, 5  mg, Intravenous, Q6H PRN, Clance Boll, MD   HYDROcodone-acetaminophen (NORCO/VICODIN) 5-325 MG per tablet 1 tablet, 1 tablet, Oral, Q4H PRN, Myles Rosenthal A, MD   lisinopril (ZESTRIL) tablet 20 mg, 20 mg, Oral, Daily, Myles Rosenthal A, MD   ondansetron [facility]) injection 4 mg, 4 mg, Intravenous, Q6H PRN, Clance Boll, MD   pantoprazole (PROTONIX) EC tablet 40 mg, 40 mg, Oral, Daily, Myles Rosenthal A, MD, 40 mg at 08/19/21 1009   simvastatin (ZOCOR) tablet 20 mg, 20 mg, Oral, q1800, Myles Rosenthal A, MD   sodium chloride flush (NS) 0.9 % injection 3  mL, 3 mL, Intravenous, Q12H, Myles Rosenthal A, MD, 3 mL at 08/19/21 1009   sodium chloride flush (NS) 0.9 % injection 3 mL, 3 mL, Intravenous, PRN, Clance Boll, MD  Current Outpatient Medications:    albuterol (VENTOLIN HFA) 108 (90 Base) MCG/ACT inhaler, Inhale 1-2 puffs into the lungs every 6 (six) hours as needed., Disp: , Rfl:    simvastatin (ZOCOR) 20 MG tablet, Take 10 mg by mouth at bedtime., Disp: , Rfl:    ascorbic acid (VITAMIN C) 1000 MG tablet, Take 1,000 mg by mouth daily., Disp: , Rfl:    aspirin 81 MG EC tablet, Take 81 mg by mouth daily., Disp: , Rfl:    carvedilol (COREG) 3.125 MG tablet, Take 3.125 mg by mouth 2 (two) times daily., Disp: , Rfl:    Cholecalciferol 10 MCG (400 UNIT) CAPS, Take 400 Units by mouth daily., Disp: , Rfl:    furosemide (LASIX) 40 MG tablet, Take 40 mg by mouth daily., Disp: , Rfl:    lisinopril (ZESTRIL) 2.5 MG tablet, Take 2.5 mg by mouth daily., Disp: , Rfl:    lisinopril-hydrochlorothiazide (ZESTORETIC) 20-12.5 MG tablet, Take 1 tablet by mouth daily., Disp: , Rfl:    Multiple Vitamin (MULTIVITAMIN) capsule, Take 1 capsule by mouth daily., Disp: , Rfl:    omeprazole (PRILOSEC) 20 MG capsule, Take 20 mg by mouth daily., Disp: , Rfl:    pantoprazole (PROTONIX) 40 MG tablet, Take 40 mg by mouth daily., Disp: , Rfl:    simvastatin (ZOCOR) 20 MG tablet, Take 20 mg by mouth daily., Disp: , Rfl:     ALLERGIES   Patient has no known allergies.     REVIEW OF SYSTEMS    Review of Systems:  Gen:  Denies  fever, sweats, chills weigh loss  HEENT: Denies blurred vision, double vision, ear pain, eye pain, hearing loss, nose bleeds, sore throat Cardiac:  No dizziness, chest pain or heaviness, chest tightness,edema Resp:   Denies cough or sputum porduction, shortness of breath,wheezing, hemoptysis,  Gi: Denies swallowing difficulty, stomach pain, nausea or vomiting, diarrhea, constipation, bowel incontinence Gu:  Denies bladder  incontinence, burning urine Ext:   Denies Joint pain, stiffness or swelling Skin: Denies  skin rash, easy bruising or bleeding or hives Endoc:  Denies polyuria, polydipsia , polyphagia or weight change Psych:   Denies depression, insomnia or hallucinations   Other:  All other systems negative   VS: BP 95/71 (BP Location: Right Arm)   Pulse 83   Temp 98.7 F (37.1 C) (Oral)   Resp 16   Ht _0  (1.753 m)   Wt 57.2 kg   SpO2 98%   BMI 18.61 kg/m      PHYSICAL EXAM    GENERAL:NAD, no fevers, chills, no weakness no fatigue HEAD: Normocephalic, atraumatic.  EYES: Pupils equal, round, reactive to light. Extraocular muscles intact. No scleral  icterus.  MOUTH: Moist mucosal membrane. Dentition intact. No abscess noted.  EAR, NOSE, THROAT: Clear without exudates. No external lesions.  NECK: Supple. No thyromegaly. No nodules. No JVD.  PULMONARY: Diffuse coarse rhonchi right sided +wheezes CARDIOVASCULAR: S1 and S2. Regular rate and rhythm. No murmurs, rubs, or gallops. No edema. Pedal pulses 2+ bilaterally.  GASTROINTESTINAL: Soft, nontender, nondistended. No masses. Positive bowel sounds. No hepatosplenomegaly.  MUSCULOSKELETAL: No swelling, clubbing, or edema. Range of motion full in all extremities.  NEUROLOGIC: Cranial nerves II through XII are intact. No gross focal neurological deficits. Sensation intact. Reflexes intact.  SKIN: No ulceration, lesions, rashes, or cyanosis. Skin warm and dry. Turgor intact.  PSYCHIATRIC: Mood, affect within normal limits. The patient is awake, alert and oriented x 3. Insight, judgment intact.       IMAGING    CT CHEST W CONTRAST  Result Date: 08/19/2021 CLINICAL DATA:  Shortness of breath. History of cancer and prior left pneumonectomy. EXAM: CT CHEST WITH CONTRAST TECHNIQUE: Multidetector CT imaging of the chest was performed during intravenous contrast administration. CONTRAST:  54m OMNIPAQUE IOHEXOL 350 MG/ML SOLN COMPARISON:  Chest CT  dated 09/25/2014. FINDINGS: Cardiovascular: Is no cardiomegaly or pericardial effusion. There is mild atherosclerotic calcification of the thoracic aorta. No aneurysmal dilatation or dissection. The origins of the great vessels of the aortic arch appear patent as visualized. Pulmonary artery embolus identified. Mediastinum/Nodes: No hilar or mediastinal adenopathy. The esophagus is grossly unremarkable. No mediastinal fluid collection. Lungs/Pleura: Status post prior left pneumonectomy with fluid-filled left hemithorax. Diffuse calcification surrounding the fluid similar to prior CT. The right lung is clear. No pneumothorax. The central airways are patent. Upper Abdomen: No acute abnormality. Musculoskeletal: There is loss of subcutaneous fat and cachexia. No acute osseous pathology. IMPRESSION: 1. No acute intrathoracic pathology. 2. Status post prior left pneumonectomy with fluid-filled left hemithorax. 3. Aortic Atherosclerosis (ICD10-I70.0). Electronically Signed   By: AAnner CreteM.D.   On: 08/19/2021 02:40   DG Chest Port 1 View  Result Date: 08/18/2021 CLINICAL DATA:  Shortness of breath. Abdominal bloating. Orthopnea. EXAM: PORTABLE CHEST 1 VIEW COMPARISON:  Chest x-ray 09/25/2014, CT angio chest 09/25/2014 FINDINGS: The heart and mediastinal contours are within normal limits. Surgical changes related to left pneumonectomy with complete opacification of the left hemithorax. No focal consolidation. No pulmonary edema. No pleural effusion. No pneumothorax. No acute osseous abnormality. IMPRESSION: Acute cardiopulmonary abnormality in a patient status post left pneumonectomy. Electronically Signed   By: MIven FinnM.D.   On: 08/18/2021 19:47     Narrative & Impression  CLINICAL DATA:  Shortness of breath. History of cancer and prior left pneumonectomy.   EXAM: CT CHEST WITH CONTRAST   TECHNIQUE: Multidetector CT imaging of the chest was performed during intravenous contrast  administration.   CONTRAST:  628mOMNIPAQUE IOHEXOL 350 MG/ML SOLN   COMPARISON:  Chest CT dated 09/25/2014.   FINDINGS: Cardiovascular: Is no cardiomegaly or pericardial effusion. There is mild atherosclerotic calcification of the thoracic aorta. No aneurysmal dilatation or dissection. The origins of the great vessels of the aortic arch appear patent as visualized. Pulmonary artery embolus identified.   Mediastinum/Nodes: No hilar or mediastinal adenopathy. The esophagus is grossly unremarkable. No mediastinal fluid collection.   Lungs/Pleura: Status post prior left pneumonectomy with fluid-filled left hemithorax. Diffuse calcification surrounding the fluid similar to prior CT. The right lung is clear. No pneumothorax. The central airways are patent.   Upper Abdomen: No acute abnormality.   Musculoskeletal: There is loss  of subcutaneous fat and cachexia. No acute osseous pathology.   IMPRESSION: 1. No acute intrathoracic pathology. 2. Status post prior left pneumonectomy with fluid-filled left hemithorax. 3. Aortic Atherosclerosis (ICD10-I70.0).     Electronically Signed   By: Anner Crete M.D.   On: 08/19/2021 02:40   ASSESSMENT/PLAN   Acute on chronic hypoxemic respiratory failure with right heart strain.        There is fluid-filled left hemithorax with attention to the right side we will obtain thoracentesis for therapeutic and diagnostic purposes initially. - present on admission  - COVID19 negative - supplemental O2 during my evaluation BIPAP ures -AFB sputum expectorated specimen -s/p thora with cytology and micro  -please encourage patient to use incentive spirometer few times each hour while hospitalized.   -Thoracentesis to sample fluid for infection and relive any potential mass effect. Will check TTE for RV failure /CHF.  Reviewed imaging with radiologist today there is no PE     Thank you for allowing me to participate in the care of this patient.   Total face to face encounter time for this patient visit was 70mn. >50% of the time was  spent in counseling and coordination of care.   Patient/Family are satisfied with care plan and all questions have been answered.  This document was prepared using Dragon voice recognition software and may include unintentional dictation errors.     FOttie Glazier M.D.  Division of [facility]

## 2021-08-19 NOTE — ED Notes (Signed)
Transfer to [facility], pend bed assignment

## 2021-08-19 NOTE — ED Notes (Signed)
Pt removing bipap to eat dinner. Pt instructed on importance of keeping bipap on.

## 2021-08-19 NOTE — ED Notes (Signed)
Pt accepted to [facility] 4E 06 per [place]. Call report to [phone]

## 2021-08-19 NOTE — ED Notes (Signed)
Blood cultures obtained before abx started

## 2021-08-19 NOTE — Progress Notes (Addendum)
Patient had transfer orders to go to progressive cardiac floor. Patient is on levophed and can't go to that floor. I have discussed this case with PCCM. Dr. Duwayne Heck is the accepting physician. I have also called CT surgery to make them aware of the transfer. I left a message with Dr. Leonarda Salon staff. Plan is for transfer to ICU tonight - if possible, for CT surgery to consult re: Left sided pleural effusion/empyema.

## 2021-08-19 NOTE — ED Notes (Signed)
Pt accepted to [facility] per [facility] at [facility]. Call report to [phone]. [facility] to transport when unit is available.

## 2021-08-19 NOTE — Progress Notes (Signed)
PHARMACY NOTE:  ANTIMICROBIAL RENAL DOSAGE ADJUSTMENT  Current antimicrobial regimen includes a mismatch between antimicrobial dosage and estimated renal function.  As per policy approved by the Pharmacy & Therapeutics and Medical Executive Committees, the antimicrobial dosage will be adjusted accordingly.  Current antimicrobial dosage:  Zosyn 3.375g IV q6h (30 minute infusion)  Indication: Sepsis  Renal Function:  Estimated Creatinine Clearance: 64.4 mL/min (by C-G formula based on SCr of 0.9 mg/dL). _0      On intermittent HD, scheduled: _1      On CRRT    Antimicrobial dosage has been changed to:  Zosyn 3.375g IV q8h (extended infusions - 4 hr)  Additional comments:   Thank you for allowing pharmacy to be a part of this patient's care.  Vira Blanco, [facility] 08/19/2021 2:46 PM

## 2021-08-19 NOTE — ED Notes (Signed)
Pt taking bipap off to eat and refusing to wear it until he is done with lunch.

## 2021-08-19 NOTE — ED Notes (Signed)
Pt transferred to room 1 and this RN assumed care. Provider messaged about pt's downward trending BP at this time.

## 2021-08-19 NOTE — Discharge Summary (Signed)
[facility] Discharge Summary   Patient: [name] [name] YME:[id number]  PCP: Patient, No Pcp Per (Inactive)  Date of admission: 08/18/2021   Date of discharge:  08/19/2021     Discharge Diagnoses:  Principal diagnosis Left hemithorax  Active Problems:   Right-sided heart failure ([place])   Admitted From: Home Disposition:   [place] to be seen by Ct surgery    Recommendations for Outpatient Follow-up:  PCP: in 1 wk Follow up LABS/TEST:     Diet recommendation: Cardiac diet  Activity: The patient is advised to gradually reintroduce usual activities, as tolerated  Discharge Condition: stable  Code Status: Full code   History of present illness: As per the H and P dictated on admission [name] [name] is a 70 y.o. male with medical history significant of squamous cell lung cancer (status post left pneumonectomy in 2005 and chemotherapy with adjuvant cisplatin/vinorelbine), hypertension, hyperlipidemia who presents to cardiology clinic for evaluation of lower extremity edema and shortness of breath. On evaluation at cardiologist office new patient appointment,patient found to have severe DOE and SOB/ echo noted severe pulhtn and acute right sided heart failure. Patient  referred to ED for admission. Per patient states he has had symptom for around 6 months, he has noted since then sob/doe has worsened and especially so in the last 2-3 weeks. He also noted swelling in  b/l lower extremities. He notes no recent infections/hospitalization and denies history of heart failure in the past.  ED Course:  Afeb, bp 153/107, hr 97, sat 83% o ra  Labs: wbcL 5.4, hgb 15.8, plt 182,  CMP: NA126 (141 remote) cr0.82 CE16 Bnp 189 Covid:negative Cxr:The heart and mediastinal contours are within normal limits. Surgical changes related to left pneumonectomy with complete opacification of the left hemithorax. No focal consolidation. No pulmonary edema. No pleural effusion. No pneumothorax. No acute  osseous abnormality.   Tx lasix 50m  D-dimer 0.86  Hospital Course:  Assessment/Plan Left-sided hemothorax Acute hypoxic and hypercapnic respiratory failure  # Acute hypoxic and hypercapnic respiratory failure secondary to left-sided hemothorax  Continue supplemental oxygen Continue inhalers Continue BiPAP Seen by pulmonary critical care, recommended thoracentesis UKoreaThora was attempted, found to have thick pus, could not send sample for cell count PCCM [facility] CT [facility] was started on Zosyn IV antibiotics Please consult CT surgery for further management of left sided pleural effusion   Hyponatremia  -possibly chronic , last lab in system 2015  -check serum osmo, urine NA, tsh  -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 effuion   +D-dimer elevated due to history of lung cancer -CTPA negative for PE   Uncontrolled HTN  -resume home medications with carvedilol, lisinopril    HLD -continue statin    Hypokalemia -replete prn    Consultants: PCCM Procedures: UKoreathora  Discharge Exam: General: Appear in mild distress, no Rash; Oral Mucosa Clear, moist. Cardiovascular: S1 and S2 Present, no Murmur, Respiratory: mild increased respiratory effort, decreased breath sounds on the left side, crackles on the left side, no wheezing appreciated  Abdomen: Bowel Sound present, Soft and no tenderness, no hernia Extremities: no Pedal edema, no calf tenderness Neurology: alert and oriented to time, place, and [name] [name] affect appropriate.  Filed Weights   08/18/21 1810  Weight: 57.2 kg   Vitals:   08/19/21 1550 08/19/21 1600  BP: (!) 82/62 (!) 87/62  Pulse: 99 80  Resp: 16 10  Temp:    SpO2: 100% 100%    DISCHARGE  MEDICATION: Allergies as of 08/19/2021   No Known Allergies      Medication List     STOP taking these medications    lisinopril 2.5 MG tablet Commonly known as: ZESTRIL       TAKE these medications    albuterol 108 (90 Base) MCG/ACT inhaler Commonly known as: VENTOLIN HFA Inhale 1-2 puffs into the lungs every 6 (six) hours as needed.   ascorbic acid 1000 MG tablet Commonly known as: VITAMIN C Take 1,000 mg by mouth daily.   aspirin 81 MG EC tablet Take 81 mg by mouth daily.   carvedilol 3.125 MG tablet Commonly known as: COREG Take 3.125 mg by mouth 2 (two) times daily.   Cholecalciferol 10 MCG (400 UNIT) Caps Take 400 Units by mouth daily.   furosemide 40 MG tablet Commonly known as: LASIX Take 40 mg by mouth daily.   lisinopril-hydrochlorothiazide 20-12.5 MG tablet Commonly known as: ZESTORETIC Take 1 tablet by mouth daily.   multivitamin capsule Take 1 capsule by mouth daily.   omeprazole 20 MG capsule Commonly known as: PRILOSEC Take 20 mg by mouth daily.   pantoprazole 40 MG tablet Commonly known as: PROTONIX Take 40 mg by mouth daily.   piperacillin-tazobactam 3.375 GM/50ML IVPB Commonly known as: ZOSYN Inject 50 mLs (3.375 g total) into the vein every 8 (eight) hours.   simvastatin 20 MG tablet Commonly known as: ZOCOR Take 20 mg by mouth daily.   simvastatin 20 MG tablet Commonly known as: ZOCOR Take 10 mg by mouth at bedtime.       No Known Allergies Discharge Instructions     Amb Referral to HF Clinic   Complete by: As directed    Diet - low sodium heart healthy   Complete by: As directed    Increase activity slowly   Complete by: As directed        The results of significant diagnostics from this hospitalization (including imaging, microbiology, ancillary and laboratory) are listed below for reference.    Significant Diagnostic Studies: CT CHEST W CONTRAST  Addendum Date: 08/19/2021   ADDENDUM REPORT: 08/19/2021 10:38  ADDENDUM: Addendum made to correct presumed voice recognition error in the body of the original report, which reads "pulmonary arterial embolus identified." There is no pulmonary embolism identified on this examination, which is non tailored but with reasonable opacification of the pulmonary arteries. Findings discussed with Dr. Sheppard Coil, 10:35 a.m., 08/19/2021. Electronically Signed   By: Eddie Candle M.D.   On: 08/19/2021 10:38   Result Date: 08/19/2021 CLINICAL DATA:  Shortness of breath. History of cancer and prior left pneumonectomy. EXAM: CT CHEST WITH CONTRAST TECHNIQUE: Multidetector CT imaging of the chest was performed during intravenous contrast administration. CONTRAST:  17m OMNIPAQUE IOHEXOL 350 MG/ML SOLN COMPARISON:  Chest CT dated 09/25/2014. FINDINGS: Cardiovascular: Is no cardiomegaly or pericardial effusion. There is mild atherosclerotic calcification of the thoracic aorta. No aneurysmal dilatation or dissection. The origins of the great vessels of the aortic arch appear patent as visualized. Pulmonary artery embolus identified. Mediastinum/Nodes: No hilar or mediastinal adenopathy. The esophagus is grossly unremarkable. No mediastinal fluid collection. Lungs/Pleura: Status post prior left pneumonectomy with fluid-filled left hemithorax. Diffuse calcification surrounding the fluid similar to prior CT. The right lung is clear. No pneumothorax.  The central airways are patent. Upper Abdomen: No acute abnormality. Musculoskeletal: There is loss of subcutaneous fat and cachexia. No acute osseous pathology. IMPRESSION: 1. No acute intrathoracic pathology. 2. Status post prior left pneumonectomy with fluid-filled left hemithorax. 3. Aortic Atherosclerosis (ICD10-I70.0). Electronically Signed: By: Anner Crete M.D. On: 08/19/2021 02:40   DG Chest Port 1 View  Result Date: 08/19/2021 CLINICAL DATA:  Post LEFT thoracentesis in a 70 year old male. EXAM: PORTABLE CHEST 1 VIEW COMPARISON:  August 18, 2021. FINDINGS: Image rotated to the LEFT. Shift of mediastinal structures slightly into the LEFT chest may be slightly increased since the previous study and diminished size of fluid based on comparison with the contour of calcified "rind" in the LEFT chest. This measures approximately 7 cm greatest transverse dimension on the current study and approximally 10.5 cm on the previous exam when measured in a similar fashion. The LEFT hemidiaphragm appears higher in the LEFT chest as well implying diminished volume of fluid in the LEFT thoracic cavity. Signs of LEFT pneumonectomy. No signs of air in the pleural space. RIGHT lung is clear. LEFT heart border is obscured. No acute skeletal process on limited assessment. IMPRESSION: Decreased volume of pleural fluid on the LEFT post thoracentesis. No definite air in the pleural space in the LEFT chest in this patient post LEFT pneumonectomy. RIGHT lung is clear. Electronically Signed   By: Zetta Bills M.D.   On: 08/19/2021 13:41   DG Chest Port 1 View  Result Date: 08/18/2021 CLINICAL DATA:  Shortness of breath. Abdominal bloating. Orthopnea. EXAM: PORTABLE CHEST 1 VIEW COMPARISON:  Chest x-ray 09/25/2014, CT angio chest 09/25/2014 FINDINGS: The heart and mediastinal contours are within normal limits. Surgical changes related to left pneumonectomy with complete opacification of the left hemithorax. No focal consolidation. No pulmonary edema. No pleural effusion. No pneumothorax. No acute osseous abnormality. IMPRESSION: Acute cardiopulmonary abnormality in a patient status post left pneumonectomy. Electronically Signed   By: Iven Finn M.D.   On: 08/18/2021 19:47    Microbiology: Recent Results (from the past 240 hour(s))  Resp Panel by RT-PCR (Flu A&B, Covid) Nasopharyngeal Swab     Status: None   Collection Time: 08/18/21  7:41 PM   Specimen: Nasopharyngeal Swab; Nasopharyngeal(NP) swabs in vial transport medium  Result Value Ref Range Status   SARS  Coronavirus 2 by RT PCR NEGATIVE NEGATIVE Final    Comment: (NOTE) SARS-CoV-2 target nucleic acids are NOT DETECTED.  The SARS-CoV-2 RNA is generally detectable in upper respiratory specimens during the acute phase of infection. The lowest concentration of SARS-CoV-2 viral copies this assay can detect is 138 copies/mL. A negative result does not preclude SARS-Cov-2 infection and should not be used as the sole basis for treatment or other patient management decisions. A negative result may occur with  improper specimen collection/handling, submission of specimen other than nasopharyngeal swab, presence of viral mutation(s) within the areas targeted by this assay, and inadequate number of viral copies(<138 copies/mL). A negative result must be combined with clinical observations, patient history, and epidemiological information. The expected result is Negative.  Fact Sheet for Patients:  EntrepreneurPulse.com.au  Fact Sheet for Healthcare Providers:  IncredibleEmployment.be  This test is no t yet approved or cleared by the [place] FDA and  has been authorized for detection and/or diagnosis of SARS-CoV-2 by FDA under an Emergency Use Authorization (EUA). This EUA will remain  in effect (meaning this test can be used) for the duration of the COVID-19 declaration under Section  564(b)(1) of the Act, 21 U.S.C.section 360bbb-3(b)(1), unless the authorization is terminated  or revoked sooner.       Influenza A by PCR NEGATIVE NEGATIVE Final   Influenza B by PCR NEGATIVE NEGATIVE Final    Comment: (NOTE) The Xpert Xpress SARS-CoV-2/FLU/RSV plus assay is intended as an aid in the diagnosis of influenza from Nasopharyngeal swab specimens and should not be used as a sole basis for treatment. Nasal washings and aspirates are unacceptable for Xpert Xpress SARS-CoV-2/FLU/RSV testing.  Fact Sheet for  Patients: EntrepreneurPulse.com.au  Fact Sheet for Healthcare Providers: IncredibleEmployment.be  This test is not yet approved or cleared by the [place] FDA and has been authorized for detection and/or diagnosis of SARS-CoV-2 by FDA under an Emergency Use Authorization (EUA). This EUA will remain in effect (meaning this test can be used) for the duration of the COVID-19 declaration under Section 564(b)(1) of the Act, 21 U.S.C. section 360bbb-3(b)(1), unless the authorization is terminated or revoked.  Performed at [facility], [address] [place], [place] [address]      Labs: CBC: Recent Labs  Lab 08/18/21 1941 08/19/21 0147  WBC 5.4 4.9  NEUTROABS 4.4  --   HGB 15.8 14.0  HCT 47.3 42.0  MCV 86.3 87.3  PLT 182 195   Basic Metabolic Panel: Recent Labs  Lab 08/18/21 1941 08/19/21 0147  NA 126*  --   K 4.5  --   CL 86*  --   CO2 32  --   GLUCOSE 94  --   BUN 13  --   CREATININE 0.82 0.90  CALCIUM 9.3  --    Liver Function Tests: Recent Labs  Lab 08/18/21 1941  AST 27  ALT 24  ALKPHOS 67  BILITOT 1.3*  PROT 7.4  ALBUMIN 4.3   No results for input(s): LIPASE, AMYLASE in the last 168 hours. No results for input(s): AMMONIA in the last 168 hours. Cardiac Enzymes: No results for input(s): CKTOTAL, CKMB, CKMBINDEX, TROPONINI in the last 168 hours. BNP (last 3 results) Recent Labs    08/18/21 1941  BNP 189.9*   CBG: No results for input(s): GLUCAP in the last 168 hours.  Time spent: 35 minutes  Signed:  Val Riles  [facility]  08/19/2021 4:11 PM

## 2021-08-19 NOTE — Progress Notes (Signed)
PT Cancellation Note  Patient Details Name: [name] [name] MRN: [id number] DOB: 10/08/1951   Cancelled Treatment:    Reason Eval/Treat Not Completed: Other (comment).  PT consult received.  Chart reviewed.  Per pt's nurse, pt still requiring BiPAP (pt took it off to eat though).  D/t pt still requiring BiPAP, will hold PT at this time and re-attempt PT evaluation at a later date/time as medically appropriate (discussed with pt's nurse).  Leitha Bleak, PT 08/19/21, 2:13 PM

## 2021-08-19 NOTE — Procedures (Signed)
Vascular and Interventional Radiology Procedure Note  Patient: Christophor Monaco DOB: [birth year] Medical Record Number: [id number] Note Date/Time: 08/19/21 12:56 PM   Performing Physician: Michaelle Birks, MD Assistant(s): None  Diagnosis: Pleural effusion  Procedure: LEFT THORACENTESIS, DIAGNOSTIC AND THERAPEUTIC  Anesthesia: Local Anesthetic Complications: None Estimated Blood Loss:  0 mL Specimens:  Sent Gram Stain, Aerobe Culture, Anerobe Culture, Fungal, Cytology, and Pathology  Findings:  - History of left pneumonectomy with large left pleural collection - Thick calcified rind encasing the fluid. - Left chest was accessed with a Yueh Needle, and 900 mL of thick consistency pleural fluid was obtained.  Pt's Critical Care MD, Dr. Lanney Gins, Luvenia Heller was made aware of the procedural findings.  See detailed procedure note with images in PACS. The patient tolerated the procedure well without incident or complication.   Michaelle Birks, MD [facility]   Pager. [place]

## 2021-08-19 NOTE — Progress Notes (Signed)
I went to patient's chart to take report but I was switched to take another pt.

## 2021-08-19 NOTE — ED Notes (Signed)
Pt sleeping NADN

## 2021-08-19 NOTE — Progress Notes (Signed)
-  Patient's lasix, carvedilol, and lisinopril held d/t low BP. MD aware. ABG ordered d/t lethargy. -Results called to MD -Orders given for BIPAP with repeat ABG check in 4hr. RT notified.

## 2021-08-19 NOTE — ED Notes (Signed)
RM 33

## 2021-08-19 NOTE — ED Notes (Signed)
EMTALA reviewed by this RN.

## 2021-08-19 NOTE — ED Notes (Signed)
[facility] called for update on bed placement. Informed that bed has been assigned by not ready yet. Joelene Millin will call when bed is ready/ [facility] will transport.

## 2021-08-20 ENCOUNTER — Inpatient Hospital Stay (HOSPITAL_COMMUNITY): Payer: Medicare Other

## 2021-08-20 ENCOUNTER — Other Ambulatory Visit: Payer: Self-pay

## 2021-08-20 ENCOUNTER — Inpatient Hospital Stay (HOSPITAL_COMMUNITY)
Admission: AD | Admit: 2021-08-20 | Discharge: 2021-09-16 | DRG: 189 | Disposition: A | Discharge status: Home / Self Care | Payer: Medicare Other | Source: Other Acute Inpatient Hospital | Attending: Internal Medicine | Admitting: Internal Medicine

## 2021-08-20 ENCOUNTER — Inpatient Hospital Stay: Payer: Self-pay

## 2021-08-20 ENCOUNTER — Encounter (HOSPITAL_COMMUNITY): Payer: Self-pay | Admitting: Student

## 2021-08-20 DIAGNOSIS — J432 Centrilobular emphysema: Secondary | ICD-10-CM

## 2021-08-20 DIAGNOSIS — Z902 Acquired absence of lung [part of]: Secondary | ICD-10-CM

## 2021-08-20 DIAGNOSIS — K219 Gastro-esophageal reflux disease without esophagitis: Secondary | ICD-10-CM | POA: Diagnosis present

## 2021-08-20 DIAGNOSIS — Q393 Congenital stenosis and stricture of esophagus: Secondary | ICD-10-CM

## 2021-08-20 DIAGNOSIS — J9621 Acute and chronic respiratory failure with hypoxia: Secondary | ICD-10-CM | POA: Diagnosis present

## 2021-08-20 DIAGNOSIS — Z23 Encounter for immunization: Secondary | ICD-10-CM

## 2021-08-20 DIAGNOSIS — I110 Hypertensive heart disease with heart failure: Secondary | ICD-10-CM | POA: Diagnosis present

## 2021-08-20 DIAGNOSIS — I480 Paroxysmal atrial fibrillation: Secondary | ICD-10-CM | POA: Diagnosis present

## 2021-08-20 DIAGNOSIS — Q399 Congenital malformation of esophagus, unspecified: Secondary | ICD-10-CM | POA: Diagnosis not present

## 2021-08-20 DIAGNOSIS — K7689 Other specified diseases of liver: Secondary | ICD-10-CM | POA: Diagnosis present

## 2021-08-20 DIAGNOSIS — R579 Shock, unspecified: Secondary | ICD-10-CM | POA: Diagnosis present

## 2021-08-20 DIAGNOSIS — K5900 Constipation, unspecified: Secondary | ICD-10-CM | POA: Diagnosis present

## 2021-08-20 DIAGNOSIS — J9602 Acute respiratory failure with hypercapnia: Secondary | ICD-10-CM | POA: Diagnosis not present

## 2021-08-20 DIAGNOSIS — J9611 Chronic respiratory failure with hypoxia: Secondary | ICD-10-CM | POA: Diagnosis not present

## 2021-08-20 DIAGNOSIS — Z79899 Other long term (current) drug therapy: Secondary | ICD-10-CM

## 2021-08-20 DIAGNOSIS — E872 Acidosis: Secondary | ICD-10-CM | POA: Diagnosis present

## 2021-08-20 DIAGNOSIS — E119 Type 2 diabetes mellitus without complications: Secondary | ICD-10-CM | POA: Diagnosis present

## 2021-08-20 DIAGNOSIS — Z9221 Personal history of antineoplastic chemotherapy: Secondary | ICD-10-CM

## 2021-08-20 DIAGNOSIS — J90 Pleural effusion, not elsewhere classified: Secondary | ICD-10-CM | POA: Diagnosis not present

## 2021-08-20 DIAGNOSIS — R109 Unspecified abdominal pain: Secondary | ICD-10-CM

## 2021-08-20 DIAGNOSIS — R609 Edema, unspecified: Secondary | ICD-10-CM | POA: Diagnosis not present

## 2021-08-20 DIAGNOSIS — Z9889 Other specified postprocedural states: Secondary | ICD-10-CM | POA: Diagnosis not present

## 2021-08-20 DIAGNOSIS — R040 Epistaxis: Secondary | ICD-10-CM | POA: Diagnosis not present

## 2021-08-20 DIAGNOSIS — R627 Adult failure to thrive: Secondary | ICD-10-CM | POA: Diagnosis present

## 2021-08-20 DIAGNOSIS — I2723 Pulmonary hypertension due to lung diseases and hypoxia: Secondary | ICD-10-CM | POA: Diagnosis present

## 2021-08-20 DIAGNOSIS — R0602 Shortness of breath: Secondary | ICD-10-CM | POA: Diagnosis not present

## 2021-08-20 DIAGNOSIS — R1310 Dysphagia, unspecified: Secondary | ICD-10-CM

## 2021-08-20 DIAGNOSIS — D638 Anemia in other chronic diseases classified elsewhere: Secondary | ICD-10-CM | POA: Diagnosis present

## 2021-08-20 DIAGNOSIS — Z681 Body mass index (BMI) 19 or less, adult: Secondary | ICD-10-CM | POA: Diagnosis not present

## 2021-08-20 DIAGNOSIS — E785 Hyperlipidemia, unspecified: Secondary | ICD-10-CM | POA: Diagnosis present

## 2021-08-20 DIAGNOSIS — I700 Atherosclerosis of aorta: Secondary | ICD-10-CM | POA: Diagnosis present

## 2021-08-20 DIAGNOSIS — E43 Unspecified severe protein-calorie malnutrition: Secondary | ICD-10-CM | POA: Insufficient documentation

## 2021-08-20 DIAGNOSIS — Z7982 Long term (current) use of aspirin: Secondary | ICD-10-CM

## 2021-08-20 DIAGNOSIS — R079 Chest pain, unspecified: Secondary | ICD-10-CM

## 2021-08-20 DIAGNOSIS — R1013 Epigastric pain: Secondary | ICD-10-CM

## 2021-08-20 DIAGNOSIS — I270 Primary pulmonary hypertension: Secondary | ICD-10-CM

## 2021-08-20 DIAGNOSIS — R64 Cachexia: Secondary | ICD-10-CM | POA: Diagnosis present

## 2021-08-20 DIAGNOSIS — J9612 Chronic respiratory failure with hypercapnia: Secondary | ICD-10-CM | POA: Diagnosis not present

## 2021-08-20 DIAGNOSIS — E871 Hypo-osmolality and hyponatremia: Secondary | ICD-10-CM | POA: Diagnosis present

## 2021-08-20 DIAGNOSIS — J9622 Acute and chronic respiratory failure with hypercapnia: Secondary | ICD-10-CM | POA: Diagnosis present

## 2021-08-20 DIAGNOSIS — I50811 Acute right heart failure: Secondary | ICD-10-CM | POA: Diagnosis present

## 2021-08-20 DIAGNOSIS — Z85118 Personal history of other malignant neoplasm of bronchus and lung: Secondary | ICD-10-CM

## 2021-08-20 DIAGNOSIS — Z20822 Contact with and (suspected) exposure to covid-19: Secondary | ICD-10-CM | POA: Diagnosis present

## 2021-08-20 DIAGNOSIS — J9601 Acute respiratory failure with hypoxia: Secondary | ICD-10-CM

## 2021-08-20 DIAGNOSIS — Z452 Encounter for adjustment and management of vascular access device: Secondary | ICD-10-CM

## 2021-08-20 DIAGNOSIS — K449 Diaphragmatic hernia without obstruction or gangrene: Secondary | ICD-10-CM | POA: Diagnosis present

## 2021-08-20 DIAGNOSIS — Z87891 Personal history of nicotine dependence: Secondary | ICD-10-CM

## 2021-08-20 DIAGNOSIS — N400 Enlarged prostate without lower urinary tract symptoms: Secondary | ICD-10-CM | POA: Diagnosis present

## 2021-08-20 DIAGNOSIS — R54 Age-related physical debility: Secondary | ICD-10-CM | POA: Diagnosis present

## 2021-08-20 DIAGNOSIS — I2721 Secondary pulmonary arterial hypertension: Secondary | ICD-10-CM | POA: Diagnosis not present

## 2021-08-20 HISTORY — DX: Type 2 diabetes mellitus without complications: E11.9

## 2021-08-20 HISTORY — DX: Heart failure, unspecified: I50.9

## 2021-08-20 HISTORY — DX: Dyspnea, unspecified: R06.00

## 2021-08-20 LAB — POCT I-STAT 7, (LYTES, BLD GAS, ICA,H+H)
Acid-Base Excess: 9 mmol/L — ABNORMAL HIGH (ref 0.0–2.0)
Bicarbonate: 39.2 mmol/L — ABNORMAL HIGH (ref 20.0–28.0)
Calcium, Ion: 1.27 mmol/L (ref 1.15–1.40)
HCT: 39 % (ref 39.0–52.0)
Hemoglobin: 13.3 g/dL (ref 13.0–17.0)
O2 Saturation: 96 %
Patient temperature: 99
Potassium: 4.2 mmol/L (ref 3.5–5.1)
Sodium: 132 mmol/L — ABNORMAL LOW (ref 135–145)
TCO2: 42 mmol/L — ABNORMAL HIGH (ref 22–32)
pCO2 arterial: 84.7 mmHg (ref 32.0–48.0)
pH, Arterial: 7.275 — ABNORMAL LOW (ref 7.350–7.450)
pO2, Arterial: 96 mmHg (ref 83.0–108.0)

## 2021-08-20 LAB — BASIC METABOLIC PANEL
Anion gap: 6 (ref 5–15)
BUN: 17 mg/dL (ref 8–23)
CO2: 37 mmol/L — ABNORMAL HIGH (ref 22–32)
Calcium: 8.7 mg/dL — ABNORMAL LOW (ref 8.9–10.3)
Chloride: 89 mmol/L — ABNORMAL LOW (ref 98–111)
Creatinine, Ser: 1.17 mg/dL (ref 0.61–1.24)
GFR, Estimated: >60 mL/min (ref >60)
Glucose, Bld: 96 mg/dL (ref 70–99)
Potassium: 4 mmol/L (ref 3.5–5.1)
Sodium: 132 mmol/L — ABNORMAL LOW (ref 135–145)

## 2021-08-20 LAB — CBC
HCT: 38 % — ABNORMAL LOW (ref 39.0–52.0)
HCT: 39.8 % (ref 39.0–52.0)
Hemoglobin: 11.7 g/dL — ABNORMAL LOW (ref 13.0–17.0)
Hemoglobin: 12.3 g/dL — ABNORMAL LOW (ref 13.0–17.0)
MCH: 28 pg (ref 26.0–34.0)
MCH: 28.4 pg (ref 26.0–34.0)
MCHC: 30.8 g/dL (ref 30.0–36.0)
MCHC: 30.9 g/dL (ref 30.0–36.0)
MCV: 90.9 fL (ref 80.0–100.0)
MCV: 91.9 fL (ref 80.0–100.0)
Platelets: 149 10*3/uL — ABNORMAL LOW (ref 150–400)
Platelets: 154 10*3/uL (ref 150–400)
RBC: 4.18 MIL/uL — ABNORMAL LOW (ref 4.22–5.81)
RBC: 4.33 MIL/uL (ref 4.22–5.81)
RDW: 12.2 % (ref 11.5–15.5)
RDW: 12.3 % (ref 11.5–15.5)
WBC: 7.5 10*3/uL (ref 4.0–10.5)
WBC: 7.7 10*3/uL (ref 4.0–10.5)
nRBC: 0 % (ref 0.0–0.2)
nRBC: 0 % (ref 0.0–0.2)

## 2021-08-20 LAB — ECHOCARDIOGRAM COMPLETE
AR max vel: 2.51 cm2
AV Area VTI: 2.44 cm2
AV Area mean vel: 2.59 cm2
AV Mean grad: 8 mmHg
AV Peak grad: 14.3 mmHg
Ao pk vel: 1.89 m/s
Area-P 1/2: 3.39 cm2
Height: 69.016 in
MV VTI: 2.45 cm2
S' Lateral: 1.8 cm
Weight: 1968.27 oz

## 2021-08-20 LAB — GLUCOSE, CAPILLARY
Glucose-Capillary: 72 mg/dL (ref 70–99)
Glucose-Capillary: 86 mg/dL (ref 70–99)
Glucose-Capillary: 87 mg/dL (ref 70–99)

## 2021-08-20 LAB — PROCALCITONIN: Procalcitonin: <0.1 ng/mL

## 2021-08-20 LAB — TSH: TSH: 0.615 u[IU]/mL (ref 0.350–4.500)

## 2021-08-20 LAB — PHOSPHORUS: Phosphorus: 4.5 mg/dL (ref 2.5–4.6)

## 2021-08-20 LAB — CORTISOL-AM, BLOOD: Cortisol - AM: 9 ug/dL (ref 6.7–22.6)

## 2021-08-20 LAB — MRSA NEXT GEN BY PCR, NASAL: MRSA by PCR Next Gen: NOT DETECTED

## 2021-08-20 LAB — BRAIN NATRIURETIC PEPTIDE: B Natriuretic Peptide: 116.3 pg/mL — ABNORMAL HIGH (ref 0.0–100.0)

## 2021-08-20 LAB — LACTIC ACID, PLASMA: Lactic Acid, Venous: 0.7 mmol/L (ref 0.5–1.9)

## 2021-08-20 LAB — MAGNESIUM: Magnesium: 1.8 mg/dL (ref 1.7–2.4)

## 2021-08-20 MED ORDER — NOREPINEPHRINE 4 MG/250ML-% IV SOLN
2.0000 ug/min | INTRAVENOUS | Status: DC
  Administered 2021-08-20: 6 ug/min via INTRAVENOUS
  Administered 2021-08-20: 8 ug/min via INTRAVENOUS
  Filled 2021-08-20 (×2): qty 250

## 2021-08-20 MED ORDER — SIMVASTATIN 20 MG PO TABS
20.0000 mg | ORAL_TABLET | Freq: Every day | ORAL | Status: DC
  Administered 2021-08-20 – 2021-09-15 (×27): 20 mg via ORAL
  Filled 2021-08-20 (×27): qty 1

## 2021-08-20 MED ORDER — SODIUM CHLORIDE 0.9 % IV SOLN
250.0000 mL | INTRAVENOUS | Status: DC
  Administered 2021-08-21 – 2021-08-22 (×2): 250 mL via INTRAVENOUS

## 2021-08-20 MED ORDER — PANTOPRAZOLE SODIUM 40 MG IV SOLR
40.0000 mg | Freq: Every day | INTRAVENOUS | Status: DC
  Administered 2021-08-20 – 2021-08-24 (×5): 40 mg via INTRAVENOUS
  Filled 2021-08-20 (×5): qty 40

## 2021-08-20 MED ORDER — ENOXAPARIN SODIUM 40 MG/0.4ML IJ SOSY
40.0000 mg | PREFILLED_SYRINGE | INTRAMUSCULAR | Status: DC
  Administered 2021-08-21 – 2021-08-23 (×3): 40 mg via SUBCUTANEOUS
  Filled 2021-08-20 (×3): qty 0.4

## 2021-08-20 MED ORDER — PIPERACILLIN-TAZOBACTAM 3.375 G IVPB
3.3750 g | Freq: Three times a day (TID) | INTRAVENOUS | Status: DC
  Administered 2021-08-20 – 2021-08-21 (×4): 3.375 g via INTRAVENOUS
  Filled 2021-08-20 (×4): qty 50

## 2021-08-20 MED ORDER — ACETAMINOPHEN 325 MG PO TABS
650.0000 mg | ORAL_TABLET | Freq: Four times a day (QID) | ORAL | Status: DC | PRN
  Administered 2021-08-20 – 2021-08-21 (×2): 650 mg via ORAL
  Filled 2021-08-20 (×2): qty 2

## 2021-08-20 MED ORDER — DOCUSATE SODIUM 100 MG PO CAPS
100.0000 mg | ORAL_CAPSULE | Freq: Two times a day (BID) | ORAL | Status: DC | PRN
  Administered 2021-09-01: 100 mg via ORAL
  Filled 2021-08-20: qty 1

## 2021-08-20 MED ORDER — POLYETHYLENE GLYCOL 3350 17 G PO PACK: 17.0000 g | PACK | Freq: Every day | ORAL | Status: DC | PRN

## 2021-08-20 MED ORDER — HEPARIN SODIUM (PORCINE) 5000 UNIT/ML IJ SOLN
5000.0000 [IU] | Freq: Three times a day (TID) | INTRAMUSCULAR | Status: DC
  Administered 2021-08-20 (×2): 5000 [IU] via SUBCUTANEOUS
  Filled 2021-08-20 (×2): qty 1

## 2021-08-20 MED ORDER — MAGNESIUM SULFATE 2 GM/50ML IV SOLN
2.0000 g | Freq: Once | INTRAVENOUS | Status: AC
  Administered 2021-08-20: 2 g via INTRAVENOUS
  Filled 2021-08-20: qty 50

## 2021-08-20 MED ORDER — CHLORHEXIDINE GLUCONATE CLOTH 2 % EX PADS
6.0000 | MEDICATED_PAD | Freq: Every day | CUTANEOUS | Status: DC
  Administered 2021-08-20 – 2021-08-24 (×5): 6 via TOPICAL

## 2021-08-20 MED ORDER — NOREPINEPHRINE 4 MG/250ML-% IV SOLN
0.0000 ug/min | INTRAVENOUS | Status: DC
Start: 2021-08-20 — End: 2021-08-23
  Administered 2021-08-21: 4 ug/min via INTRAVENOUS
  Administered 2021-08-21: 7 ug/min via INTRAVENOUS
  Administered 2021-08-22: 10 ug/min via INTRAVENOUS
  Filled 2021-08-20 (×2): qty 250

## 2021-08-20 MED ORDER — ASPIRIN 81 MG PO CHEW
81.0000 mg | CHEWABLE_TABLET | Freq: Every day | ORAL | Status: DC
  Administered 2021-08-21 – 2021-09-16 (×26): 81 mg via ORAL
  Filled 2021-08-20 (×27): qty 1

## 2021-08-20 MED ORDER — SODIUM CHLORIDE 0.9 % IV SOLN: INTRAVENOUS | Status: DC | PRN

## 2021-08-20 NOTE — Consult Note (Signed)
Reason for Consult:? empyema Referring Physician: CCM  [name] [name] is an 70 y.o. male.  HPI: 70 yo man admitted with c/o shortness of breath and leg swelling.  [name] [name] is a 70 yo man with a history of lung cancer, s/p left pneumonectomy in 2005, pulmonary hypertension, hypertension hyperlipidemia, and type II diabetes.  Went to Cardiology for SOB and PE. Echo showed severe pulmonary hypertension and acute RV failure. Sent to ED and admitted at [facility]. Was in AF with RVR, severe respiratory acidosis.  CT chest interpreted as large effusion with mass effect on mediastinum. Thoracentesis performed and 900 ml of fluid removed. Noted to be thick brownish fluid evacuated. Transferred to [facility] for further management.  Currently on BIPAP. Denies abdominal pain although he had complained of it previously.  Past Medical History:  Diagnosis Date   Cancer ([place])    lung   CHF (congestive heart failure) ([facility])    Diabetes mellitus without complication ([place])    Dyspnea    Hyperlipidemia    Hypertension     Past Surgical History:  Procedure Laterality Date   COLONOSCOPY WITH PROPOFOL N/A 04/20/2021   Procedure: COLONOSCOPY WITH PROPOFOL;  Surgeon: Toledo, Benay Pike, MD;  Location: [facility] ENDOSCOPY;  Service: Gastroenterology;  Laterality: N/A;   ESOPHAGOGASTRODUODENOSCOPY N/A 04/20/2021   Procedure: ESOPHAGOGASTRODUODENOSCOPY (EGD);  Surgeon: Toledo, Benay Pike, MD;  Location: [facility] ENDOSCOPY;  Service: Gastroenterology;  Laterality: N/A;   LUNG REMOVAL, PARTIAL      History reviewed. No pertinent family history.  Social History:  reports that he has quit smoking. He has never used smokeless tobacco. He reports current alcohol use. He reports that he does not currently use drugs.  Allergies: No Known Allergies  Medications: Scheduled:  aspirin  81 mg Oral Daily   heparin  5,000 Units Subcutaneous Q8H   pantoprazole (PROTONIX) IV  40 mg Intravenous QHS   simvastatin  20 mg Oral QHS    Results  for orders placed or performed during the hospital encounter of 08/20/21 (from the past 48 hour(s))  Glucose, capillary     Status: None   Collection Time: 08/20/21 12:44 AM  Result Value Ref Range   Glucose-Capillary 87 70 - 99 mg/dL    Comment: Glucose reference range applies only to samples taken after fasting for at least 8 hours.  MRSA Next Gen by PCR, Nasal     Status: None   Collection Time: 08/20/21 12:45 AM  Result Value Ref Range   MRSA by PCR Next Gen NOT DETECTED NOT DETECTED    Comment: (NOTE) The GeneXpert MRSA Assay (FDA approved for NASAL specimens only), is one component of a comprehensive MRSA colonization surveillance program. It is not intended to diagnose MRSA infection nor to guide or monitor treatment for MRSA infections. Test performance is not FDA approved in patients less than 27 years old. Performed at [facility], [place] [address] [place], [place] [address]   I-STAT 7, (LYTES, BLD GAS, ICA, H+H)     Status: Abnormal   Collection Time: 08/20/21 12:59 AM  Result Value Ref Range   pH, Arterial 7.275 (L) 7.350 - 7.450   pCO2 arterial 84.7 (HH) 32.0 - 48.0 mmHg   pO2, Arterial 96 83.0 - 108.0 mmHg   Bicarbonate 39.2 (H) 20.0 - 28.0 mmol/L   TCO2 42 (H) 22 - 32 mmol/L   O2 Saturation 96.0 %   Acid-Base Excess 9.0 (H) 0.0 - 2.0 mmol/L   Sodium 132 (L) 135 - 145  mmol/L   Potassium 4.2 3.5 - 5.1 mmol/L   Calcium, Ion 1.27 1.15 - 1.40 mmol/L   HCT 39.0 39.0 - 52.0 %   Hemoglobin 13.3 13.0 - 17.0 g/dL   Patient temperature 99.0 F    Collection site Radial    Drawn by RT    Sample type ARTERIAL    Comment NOTIFIED PHYSICIAN   Glucose, capillary     Status: None   Collection Time: 08/20/21  4:09 AM  Result Value Ref Range   Glucose-Capillary 86 70 - 99 mg/dL    Comment: Glucose reference range applies only to samples taken after fasting for at least 8 hours.  CBC     Status: Abnormal   Collection Time: 08/20/21  4:13 AM  Result Value Ref Range   WBC  7.7 4.0 - 10.5 K/uL   RBC 4.33 4.22 - 5.81 MIL/uL   Hemoglobin 12.3 (L) 13.0 - 17.0 g/dL   HCT 39.8 39.0 - 52.0 %   MCV 91.9 80.0 - 100.0 fL   MCH 28.4 26.0 - 34.0 pg   MCHC 30.9 30.0 - 36.0 g/dL   RDW 12.3 11.5 - 15.5 %   Platelets 154 150 - 400 K/uL   nRBC 0.0 0.0 - 0.2 %    Comment: Performed at [facility], [place] [address], [place], [place] [address]  Procalcitonin     Status: None   Collection Time: 08/20/21  4:13 AM  Result Value Ref Range   Procalcitonin <0.10 ng/mL    Comment:        Interpretation: PCT (Procalcitonin) <= 0.5 ng/mL: Systemic infection (sepsis) is not likely. Local bacterial infection is possible. (NOTE)       Sepsis PCT Algorithm           Lower Respiratory Tract                                      Infection PCT Algorithm    ----------------------------     ----------------------------         PCT < 0.25 ng/mL                PCT < 0.10 ng/mL          Strongly encourage             Strongly discourage   discontinuation of antibiotics    initiation of antibiotics    ----------------------------     -----------------------------       PCT 0.25 - 0.50 ng/mL            PCT 0.10 - 0.25 ng/mL               OR       >80% decrease in PCT            Discourage initiation of                                            antibiotics      Encourage discontinuation           of antibiotics    ----------------------------     -----------------------------         PCT >= 0.50 ng/mL              PCT  0.26 - 0.50 ng/mL               AND        <80% decrease in PCT             Encourage initiation of                                             antibiotics       Encourage continuation           of antibiotics    ----------------------------     -----------------------------        PCT >= 0.50 ng/mL                  PCT > 0.50 ng/mL               AND         increase in PCT                  Strongly encourage                                      initiation of  antibiotics    Strongly encourage escalation           of antibiotics                                     -----------------------------                                           PCT <= 0.25 ng/mL                                                 OR                                        > 80% decrease in PCT                                      Discontinue / Do not initiate                                             antibiotics  Performed at [facility], [address] [place], [place] [address]   Lactic acid, plasma     Status: None   Collection Time: 08/20/21  4:13 AM  Result Value Ref Range   Lactic Acid, Venous 0.7 0.5 - 1.9 mmol/L    Comment: Performed at [facility] [address], [place], [place] [address]  TSH     Status: None   Collection Time: 08/20/21  4:13 AM  Result Value Ref Range   TSH 0.615 0.350 - 4.500 uIU/mL    Comment: Performed by a 3rd Generation assay with a functional sensitivity of <=0.01 uIU/mL. Performed at [facility], [place] [address], [place], [place] [address]   Brain natriuretic peptide     Status: Abnormal   Collection Time: 08/20/21  4:14 AM  Result Value Ref Range   B Natriuretic Peptide 116.3 (H) 0.0 - 100.0 pg/mL    Comment: Performed at [facility] [address] [place], [place] [address]  CBC     Status: Abnormal   Collection Time: 08/20/21  4:15 AM  Result Value Ref Range   WBC 7.5 4.0 - 10.5 K/uL   RBC 4.18 (L) 4.22 - 5.81 MIL/uL   Hemoglobin 11.7 (L) 13.0 - 17.0 g/dL   HCT 38.0 (L) 39.0 - 52.0 %   MCV 90.9 80.0 - 100.0 fL   MCH 28.0 26.0 - 34.0 pg   MCHC 30.8 30.0 - 36.0 g/dL   RDW 12.2 11.5 - 15.5 %   Platelets 149 (L) 150 - 400 K/uL   nRBC 0.0 0.0 - 0.2 %    Comment: Performed at [facility], [place] [address] [place], [place] [address]  Basic metabolic panel     Status: Abnormal   Collection Time: 08/20/21  4:15 AM  Result Value Ref Range   Sodium 132 (L) 135 - 145 mmol/L   Potassium 4.0  3.5 - 5.1 mmol/L   Chloride 89 (L) 98 - 111 mmol/L   CO2 37 (H) 22 - 32 mmol/L   Glucose, Bld 96 70 - 99 mg/dL    Comment: Glucose reference range applies only to samples taken after fasting for at least 8 hours.   BUN 17 8 - 23 mg/dL   Creatinine, Ser 1.17 0.61 - 1.24 mg/dL   Calcium 8.7 (L) 8.9 - 10.3 mg/dL   GFR, Estimated >60 >60 mL/min    Comment: (NOTE) Calculated using the CKD-EPI Creatinine Equation (2021)    Anion gap 6 5 - 15    Comment: Performed at [facility] [address] [place], [place] [address]  Magnesium     Status: None   Collection Time: 08/20/21  4:15 AM  Result Value Ref Range   Magnesium 1.8 1.7 - 2.4 mg/dL    Comment: Performed at [facility] [address] [place], [place] [address]  Phosphorus     Status: None   Collection Time: 08/20/21  4:15 AM  Result Value Ref Range   Phosphorus 4.5 2.5 - 4.6 mg/dL    Comment: Performed at [facility] [address], [place], [place] [address]    CT CHEST W CONTRAST  Addendum Date: 08/19/2021   ADDENDUM REPORT: 08/19/2021 10:38 ADDENDUM: Addendum made to correct presumed voice recognition error in the body of the original report, which reads "pulmonary arterial embolus identified." There is no pulmonary embolism identified on this examination, which is non tailored but with reasonable opacification of the pulmonary arteries. Findings discussed with Dr. Sheppard Coil, 10:35 a.m., 08/19/2021. Electronically Signed   By: Eddie Candle M.D.   On: 08/19/2021 10:38   Result Date: 08/19/2021 CLINICAL DATA:  Shortness of breath. History of cancer and prior left pneumonectomy. EXAM: CT CHEST WITH CONTRAST TECHNIQUE: Multidetector CT imaging of the chest was performed during intravenous contrast administration. CONTRAST:  4m OMNIPAQUE IOHEXOL 350 MG/ML SOLN COMPARISON:  Chest CT dated 09/25/2014. FINDINGS: Cardiovascular:  Is no cardiomegaly or pericardial effusion. There is mild atherosclerotic calcification of  the thoracic aorta. No aneurysmal dilatation or dissection. The origins of the great vessels of the aortic arch appear patent as visualized. Pulmonary artery embolus identified. Mediastinum/Nodes: No hilar or mediastinal adenopathy. The esophagus is grossly unremarkable. No mediastinal fluid collection. Lungs/Pleura: Status post prior left pneumonectomy with fluid-filled left hemithorax. Diffuse calcification surrounding the fluid similar to prior CT. The right lung is clear. No pneumothorax. The central airways are patent. Upper Abdomen: No acute abnormality. Musculoskeletal: There is loss of subcutaneous fat and cachexia. No acute osseous pathology. IMPRESSION: 1. No acute intrathoracic pathology. 2. Status post prior left pneumonectomy with fluid-filled left hemithorax. 3. Aortic Atherosclerosis (ICD10-I70.0). Electronically Signed: By: Anner Crete M.D. On: 08/19/2021 02:40   DG Chest Port 1 View  Result Date: 08/20/2021 CLINICAL DATA:  Pleural effusion EXAM: PORTABLE CHEST 1 VIEW COMPARISON:  08/19/2021 FINDINGS: Prior left pneumonectomy with complete opacification of the left hemithorax. Right lung is clear. Heart likely normal size. Aortic atherosclerosis. IMPRESSION: Prior left pneumonectomy. No focal opacity on the right. Electronically Signed   By: Rolm Baptise M.D.   On: 08/20/2021 01:58   DG Chest Port 1 View  Result Date: 08/19/2021 CLINICAL DATA:  Post LEFT thoracentesis in a 70 year old male. EXAM: PORTABLE CHEST 1 VIEW COMPARISON:  August 18, 2021. FINDINGS: Image rotated to the LEFT. Shift of mediastinal structures slightly into the LEFT chest may be slightly increased since the previous study and diminished size of fluid based on comparison with the contour of calcified "rind" in the LEFT chest. This measures approximately 7 cm greatest transverse dimension on the current study and approximally 10.5 cm on the previous exam when measured in a similar fashion. The LEFT hemidiaphragm  appears higher in the LEFT chest as well implying diminished volume of fluid in the LEFT thoracic cavity. Signs of LEFT pneumonectomy. No signs of air in the pleural space. RIGHT lung is clear. LEFT heart border is obscured. No acute skeletal process on limited assessment. IMPRESSION: Decreased volume of pleural fluid on the LEFT post thoracentesis. No definite air in the pleural space in the LEFT chest in this patient post LEFT pneumonectomy. RIGHT lung is clear. Electronically Signed   By: Zetta Bills M.D.   On: 08/19/2021 13:41   DG Chest Port 1 View  Result Date: 08/18/2021 CLINICAL DATA:  Shortness of breath. Abdominal bloating. Orthopnea. EXAM: PORTABLE CHEST 1 VIEW COMPARISON:  Chest x-ray 09/25/2014, CT angio chest 09/25/2014 FINDINGS: The heart and mediastinal contours are within normal limits. Surgical changes related to left pneumonectomy with complete opacification of the left hemithorax. No focal consolidation. No pulmonary edema. No pleural effusion. No pneumothorax. No acute osseous abnormality. IMPRESSION: Acute cardiopulmonary abnormality in a patient status post left pneumonectomy. Electronically Signed   By: Iven Finn M.D.   On: 08/18/2021 19:47   [place] EKG SITE RITE  Result Date: 08/20/2021 If Site Rite image not attached, placement could not be confirmed due to current cardiac rhythm.  US THORACENTESIS ASP PLEURAL SPACE W/IMG GUIDE  Result Date: 08/19/2021 INDICATION: Symptomatic LEFT sided pleural effusion EXAM: US THORACENTESIS ASP PLEURAL SPACE W/IMG GUIDE COMPARISON:  Chest radiograph, 08/18/2021.  CT chest, 08/20/2019. MEDICATIONS: None. COMPLICATIONS: None immediate. TECHNIQUE: Informed written consent was obtained from the patient after a discussion of the risks, benefits and alternatives to treatment. A timeout was performed prior to the initiation of the procedure. Initial ultrasound scanning demonstrates a large volume LEFT pleural effusion.  The lower chest was prepped  and draped in the usual sterile fashion. 1% lidocaine was used for local anesthesia. Under direct ultrasound guidance, a 19 gauge, 7-cm, Yueh catheter was introduced. An ultrasound image was saved for documentation purposes. The thoracentesis was performed. The catheter was removed and a dressing was applied. The patient tolerated the procedure well without immediate post procedural complication. A postprocedural upright chest radiograph was requested. FINDINGS: A total of approximately 0.9 liters of thick serosanguineous fluid was removed. Requested samples were sent to the laboratory. IMPRESSION: 1. Successful ultrasound-guided LEFT sided thoracentesis yielding 4 liters of pleural fluid. 2. Thick serosanguineous consistency of pleural fluid obtained. The samples were submitted for analysis. Michaelle Birks, MD [facility] Electronically Signed   By: Michaelle Birks M.D.   On: 08/19/2021 17:39    Review of Systems  Reason unable to perform ROS: limited due to somnolence.  Constitutional:  Positive for activity change and fatigue. Negative for fever.  Respiratory:  Positive for shortness of breath.   Cardiovascular:  Positive for leg swelling.  Gastrointestinal:  Positive for abdominal pain.  Blood pressure (!) 91/55, pulse 81, temperature 99.1 F (37.3 C), temperature source Axillary, resp. rate (!) 79, height 5' 9.02" (1.753 m), weight 55.8 kg, SpO2 99 %. Physical Exam Vitals reviewed.  Constitutional:      Appearance: He is ill-appearing.     Comments: somnolent  HENT:     Head: Normocephalic and atraumatic.  Neck:     Vascular: No carotid bruit.     Comments: Trachea shifted to R Cardiovascular:     Rate and Rhythm: Normal rate and regular rhythm.     Heart sounds: Normal heart sounds.  Pulmonary:     Breath sounds: No wheezing.     Comments: No BS on left Abdominal:     General: There is no distension.     Palpations: Abdomen is  soft.  Skin:    General: Skin is dry.  Neurological:     General: No focal deficit present.    Assessment/Plan: 70 yo man with history of pneumonectomy for lung cancer in 2005. Also history of hypertension, hyperlipidemia and diabetes. Presented with shortness of breath and leg swelling. Admitted with acute right heart failure and acute on chronic respiratory failure.  He had a CT which showed postpneumonectomy changes on left. Had a thoracentesis which drained thick brownish fluid. Sent for culture.  I reviewed his AT and compared it side by side with his CT from 2015. There has been minimal change in the interim. No change in CXR after thoracentesis.  He has severe hypoxic and hypercarbic respiratory failure with ABG 7.27/85/96/ +9.  No fevers, WBC normal, procalcitonin < 0.10. Doubt this is infectious in etiology. Obviously can't completely rule out infected space, but CT with minimal change in 7 years and fluid being what you would expect in that chronic space there is no indication for placing a drain or operating on that.   Treat respiratory failure and right heart failure.  Follow up on pleural fluid cultures  Melrose Nakayama 08/20/2021, 9:18 AM

## 2021-08-20 NOTE — Procedures (Signed)
Arterial Catheter Insertion Procedure Note  [name] [name]  [id number]  1951-04-29  Date:08/20/21  Time: 0310   Provider Performing: Kelle Darting    Procedure: Insertion of Arterial Line [phone] without US guidance  Indication(s) Blood pressure monitoring and/or need for frequent ABGs  Consent Risks of the procedure as well as the alternatives and risks of each were explained to the patient and/or caregiver.  Consent for the procedure was obtained and is signed in the bedside chart  Anesthesia None   Time Out Verified patient identification, verified procedure, site/side was marked, verified correct patient position, special equipment/implants available, medications/allergies/relevant history reviewed, required imaging and test results available.   Sterile Technique Maximal sterile technique including full sterile barrier drape, hand hygiene, sterile gown, sterile gloves, mask, hair covering, sterile ultrasound probe cover (if used).   Procedure Description Area of catheter insertion was cleaned with chlorhexidine and draped in sterile fashion. With real-time ultrasound guidance an arterial catheter was placed into the left radial artery.  Appropriate arterial tracings confirmed on monitor.     Complications/Tolerance None; patient tolerated the procedure well.   EBL Minimal   Specimen(s) None

## 2021-08-20 NOTE — Consult Note (Addendum)
[name], MRN:  [id number], DOB:  November 28, 1951, LOS: 0 ADMISSION DATE:  08/20/2021, CONSULTATION DATE:   9/2 REFERRING MD:  [name], REASON FOR CONSULT:  Left sided pleural effusion/empyema  History of Present Illness:  [name] [name] is  70 y.o. M  who is seen in consultation at the request of Dr. [name] for recommendations on further evaluation and management of left sided pleural effusion/empyema. [facility]  on [facility]/1 with complaints of shortness of breath, lower extremity edema, and abdominal bloating. The patient was admitted to Hospitalist service.   He has a pertinent past medical history of squamous cell lung cancer (s/p pneumonectomy (2005) and chemo with adjuvant cisplatin/vinorelbine), HLD, HTN   On 9/1 he was seen by cardiology and sent to ED for diuresis after an ECHO was obtained that was concerning for pulmonary HTN and RV failure. He He endorsed several months of progressive dyspnea on exertion with increasing LLE edema over the past two weeks. He presented to the ED. Ed workup was notable for SPO2 of 83%, NA 126, BNP 189. CT chest demonstrated a large left sided pleural effusion.    On 9/2 Pulmonology was consulted and recommended CT surgery consult. IR completed a thoracentesis with 91m of thick consistency pleural fluid removed with a thick calcified rind encasing fluid. [facility] to the patient having a new levophed requirement and for a CTTS consult.     Pertinent  Medical History  squamous cell lung cancer (s/p pneumonectomy (2005) and chemo with adjuvant cisplatin/vinorelbine), HLD, HTN  Significant Hospital Events: Including procedures, antibiotic start and stop dates in addition to other pertinent events   [facility] [place] [facility] 900[facility], [facility] Interim History / Subjective:  On 5 levophed  Subjective: Denies chest pain,  denies SOB, endorses orthopnea  Objective   Blood pressure 101/67, pulse 75, temperature 99 F (37.2 C), temperature source Axillary, resp. rate 20, SpO2 100 %.    FiO2 (%):  [35 %] 35 %   Intake/Output Summary (Last 24 hours) at 08/20/2021 0207 Last data filed at 08/20/2021 0100 Gross per 24 hour  Intake 18.75 ml  Output --  Net 18.75 ml   There were no vitals filed for this visit.  Examination: General:  in bed, NAD, thin HEENT: MM pink/moist, anicteric, atraumatic Neuro: GCS 15, RASS 0, PERRL 314mCV: S1S2, NSR, no m/r/g appreciated PULM:  Clear on the upper and lower lobes on right. Trachea midline, chest expansion symmetric GI: soft, bsx4 active, nontender Extremities: warm/dry, no pretibial edema, capillary refill less than 3 seconds  Skin: no rashes or lesions noted   Resolved Hospital Problem list     Assessment & Plan:  Acute respiratory failure with hypoxia and hypercapnia- documented hypoxia on admission, most recent ABG 7.27/84.7/96/39.2. CT negative for PE. Bicarb appears chronically elevated on old BMPs Pleural Effusion- LD fluid 935, likely exudative. S/P thoracentesis with 90037mluid removed on 9/2. HX Lung ca s/p pneumonectomy -CTTS consulted by Dr. ZieClearence Peder notes). Follow up. -Follow up pleural fluid cultures and studies (gram stain, aerobe and anaerobe culture, fungal, cytology, and pathology. Unable to send cell count) -Continue BiPAP -Continue Zosyn. Deescalate based on blood cultures/fluid cultures. -Pulmonary hygiene as tolerated   Shock, suspect cardiogenic vs medication induced Patient reports taking his home lisinopril and coreg from his pill bottle in ED on 9/2.  Afebrile. No leukocytosis.  On bedside US no obvious pericardial effusion seen. Diuresed with 40 furosemide on 9/1 and then given 1L IVF on 9/2.  No le edema on exam. On 5 of levophed. Troponin 19, BNP 189. No ST changes seen on 12 lead. No obvious infiltrate on cxr. S/p l  pneumonectomy.  Acute right sided HF Pulmonary HTN Reported sent to ED for Acute right-sided heart failure and Pulmonary hypertension seen on ECHO at patients cardiologist. Unable to find echo. Per note- Normal LV, Moderate MR. Moderately dilated RV with moderate TR. PASP 70. ? PH post pneumonectomy.  CT negative for PE -Goal MAP 65 or greater. Continue NE. Titrate to goal. -Hold home antihypertensives  -Obtain ECHO -Check PCT and lactate -Continue BiPAP -AM cardiology consult.   Hyponatremia NA 126. OSM 276. Suspect pseudohyponatremia -No free h20 -Continue diuresis -Monitor on BMP   HX HTN HX HLD -Continue to hold home antihypertensives  -Continue asa/statin  Best Practice (right click and "Reselect all SmartList Selections" daily)   Diet/type: NPO DVT prophylaxis: prophylactic heparin  GI prophylaxis: PPI Lines: N/A Foley:  N/A Code Status:  full code Last date of multidisciplinary goals of care discussion [Pending]  Labs   CBC: Recent Labs  Lab 08/18/21 1941 08/19/21 0147 08/20/21 0059  WBC 5.4 4.9  --   NEUTROABS 4.4  --   --   HGB 15.8 14.0 13.3  HCT 47.3 42.0 39.0  MCV 86.3 87.3  --   PLT 182 158  --     Basic Metabolic Panel: Recent Labs  Lab 08/18/21 1941 08/19/21 0147 08/20/21 0059  NA 126*  --  132*  K 4.5  --  4.2  CL 86*  --   --   CO2 32  --   --   GLUCOSE 94  --   --   BUN 13  --   --   CREATININE 0.82 0.90  --   CALCIUM 9.3  --   --    GFR: Estimated Creatinine Clearance: 64.4 mL/min (by C-G formula based on SCr of 0.9 mg/dL). Recent Labs  Lab 08/18/21 1941 08/19/21 0147  WBC 5.4 4.9    Liver Function Tests: Recent Labs  Lab 08/18/21 1941  AST 27  ALT 24  ALKPHOS 67  BILITOT 1.3*  PROT 7.4  ALBUMIN 4.3   No results for input(s): LIPASE, AMYLASE in the last 168 hours. No results for input(s): AMMONIA in the last 168 hours.  ABG    Component Value Date/Time   PHART 7.275 (L) 08/20/2021 0059   PCO2ART 84.7 (HH)  08/20/2021 0059   PO2ART 96 08/20/2021 0059   HCO3 39.2 (H) 08/20/2021 0059   TCO2 42 (H) 08/20/2021 0059   O2SAT 96.0 08/20/2021 0059     Coagulation Profile: No results for input(s): INR, PROTIME in the last 168 hours.  Cardiac Enzymes: No results for input(s): CKTOTAL, CKMB, CKMBINDEX, TROPONINI in the last 168 hours.  HbA1C: No results found for: HGBA1C  CBG: Recent Labs  Lab 08/20/21 0044  GLUCAP 87    Review of Systems:   Positives in bold  Gen: fever, chills, weight change, fatigue, night sweats HEENT:  blurred vision, double vision, hearing loss, tinnitus, sinus congestion, rhinorrhea, sore throat, neck stiffness, dysphagia PULM:  shortness of breath, cough, sputum production, hemoptysis, wheezing CV: chest pain, edema, orthopnea, paroxysmal nocturnal dyspnea, palpitations, DOE GI:  abdominal pain, nausea, vomiting, diarrhea, hematochezia, melena, constipation, change in bowel habits, bloating GU: dysuria, hematuria, polyuria, oliguria, urethral discharge Endocrine:  hot or cold intolerance, polyuria, polyphagia or appetite change Derm: rash, dry skin, scaling or peeling skin change Heme: easy bruising, bleeding, bleeding gums Neuro: headache, numbness, weakness, slurred speech, loss of memory or consciousness   Past Medical History:  He,  has a past medical history of Cancer ([facility]), CHF (congestive heart failure) ([place]), Diabetes mellitus without complication ([place]), Dyspnea, Hyperlipidemia, and Hypertension.   Surgical History:   Past Surgical History:  Procedure Laterality Date   COLONOSCOPY WITH PROPOFOL N/A 04/20/2021   Procedure: COLONOSCOPY WITH PROPOFOL;  Surgeon: [place]edo, Benay Pike, MD;  Location: [facility] ENDOSCOPY;  Service: Gastroenterology;  Laterality: N/A;   ESOPHAGOGASTRODUODENOSCOPY N/A 04/20/2021   Procedure: ESOPHAGOGASTRODUODENOSCOPY (EGD);  Surgeon: [place]edo, Benay Pike, MD;  Location: [facility] ENDOSCOPY;  Service: Gastroenterology;  Laterality: N/A;   LUNG  REMOVAL, PARTIAL       Social History:   reports that he has quit smoking. He has never used smokeless tobacco. He reports current alcohol use. He reports that he does not currently use drugs.   Family History:  His family history is not on file.   Allergies No Known Allergies   Home Medications  Prior to Admission medications   Medication Sig Start Date End Date Taking? Authorizing Provider  albuterol (VENTOLIN HFA) 108 (90 Base) MCG/ACT inhaler Inhale 1-2 puffs into the lungs every 6 (six) hours as needed. 04/04/21   [provider]  ascorbic acid (VITAMIN C) 1000 MG tablet Take 1,000 mg by mouth daily.    [provider]  aspirin 81 MG EC tablet Take 81 mg by mouth daily. 12/30/19   [provider]  carvedilol (COREG) 3.125 MG tablet Take 3.125 mg by mouth 2 (two) times daily. 07/11/21   [provider]  Cholecalciferol 10 MCG (400 UNIT) CAPS Take 400 Units by mouth daily.    [provider]  furosemide (LASIX) 40 MG tablet Take 40 mg by mouth daily. Patient not taking: Reported on 08/19/2021 08/18/21   [provider]  lisinopril-hydrochlorothiazide (ZESTORETIC) 20-12.5 MG tablet Take 1 tablet by mouth daily. 04/26/11   [provider]  Multiple Vitamin (MULTIVITAMIN) capsule Take 1 capsule by mouth daily.    [provider]  omeprazole (PRILOSEC) 20 MG capsule Take 20 mg by mouth daily. 07/11/21   [provider]  pantoprazole (PROTONIX) 40 MG tablet Take 40 mg by mouth daily.    [provider]  piperacillin-tazobactam (ZOSYN) 3.375 GM/50ML IVPB Inject 50 mLs (3.375 g total) into the vein every 8 (eight) hours. 08/19/21   Val Riles, MD  simvastatin (ZOCOR) 20 MG tablet Take 20 mg by mouth daily.    [provider]  simvastatin (ZOCOR) 20 MG tablet Take 10 mg by mouth at bedtime. Patient not taking: No sig reported 04/26/11   [provider]     Critical care time: [place] Kaysi Ourada, Jr., MSN, APRN, AGACNP-BC [facility]  08/20/2021 , 2:20 AM  Please see [email or web address] for pager details  If no response, please call [phone] After hours, please call Elink at [phone]

## 2021-08-20 NOTE — Progress Notes (Signed)
[facility] ADULT ICU REPLACEMENT PROTOCOL   The patient does apply for the [facility] Adult ICU Electrolyte Replacment Protocol based on the criteria listed below:   1.Exclusion criteria: TCTS patients, ECMO patients and Hypothermia Protocol, and   Dialysis patients 2. Is GFR >/= 30 ml/min? Yes.    Patient's GFR today is >60 3. Is SCr </= 2? No. Patient's SCr is 1.17 mg/dL 4. Did SCr increase >/= 0.5 in 24 hours? No. 5.Pt's weight >40kg  Yes.   6. Abnormal electrolyte(s): mag 1.8  7. Electrolytes replaced per protocol 8.  Call MD STAT for K+ </= 2.5, Phos </= 1, or Mag </= 1 Physician:  n/a  [name] [name] 08/20/2021 5:10 AM

## 2021-08-20 NOTE — Progress Notes (Signed)
[facility] Progress Note Patient Name: [name] [name] DOB: 12-22-1950 MRN: [id number]   Date of Service  08/20/2021  HPI/Events of Note  Patient transferred from [facility] ED to [facility] for  acute on chronic respiratory failure on BIPAP, and a large left pleural effusion / empyema s/p thoracentesis of 900 ml of turbid fluid, the fluid collection being surrounded by a rind. He has a history of squamous cell lung cancer s/p pneumonectomy in 2005. Patient is on Norepinephrine gtt for hypotension.  eICU Interventions  New Patient Evaluation.        Kerry Kass Natalia Wittmeyer 08/20/2021, 12:53 AM

## 2021-08-20 NOTE — Progress Notes (Signed)
Secure chat with RN re [place].  States CVC was placed and PICC no longer needed.  RN to cancel PICC order.

## 2021-08-20 NOTE — Procedures (Signed)
Central Venous Catheter Insertion Procedure Note  [name] [name]  [id number]  [birth year]  Date:08/20/21  Time:5:10 PM   Provider Performing:Hassie Mandt Cipriano Mile   Procedure: Insertion of Non-tunneled Central Venous [phone]) with US guidance ([id number])   Indication(s) Medication administration  Consent Risks of the procedure as well as the alternatives and risks of each were explained to the patient and/or caregiver.  Consent for the procedure was obtained and is signed in the bedside chart  Anesthesia Topical only with 1% lidocaine   Timeout Verified patient identification, verified procedure, site/side was marked, verified correct patient position, special equipment/implants available, medications/allergies/relevant history reviewed, required imaging and test results available.  Sterile Technique Maximal sterile technique including full sterile barrier drape, hand hygiene, sterile gown, sterile gloves, mask, hair covering, sterile ultrasound probe cover (if used).  Procedure Description Area of catheter insertion was cleaned with chlorhexidine and draped in sterile fashion.  With real-time ultrasound guidance a central venous catheter was placed into the left internal jugular vein. Nonpulsatile blood flow and easy flushing noted in all ports.  The catheter was sutured in place and sterile dressing applied.  Complications/Tolerance None; patient tolerated the procedure well. Chest X-ray is ordered to verify placement for internal jugular or subclavian cannulation.   Chest x-ray is not ordered for femoral cannulation.  EBL Minimal  Specimen(s) None

## 2021-08-20 NOTE — Progress Notes (Signed)
eLink Physician-Brief Progress Note Patient Name: [name] [name] DOB: 1951/02/21 MRN: [id number]   Date of Service  08/20/2021  HPI/Events of Note  Patient complaining of pain/tenderness on left side of neck  at CVC  insertion site, requesting tyenol for discomfort. Nurse states that line infusing without difficulty and no other findings  eICU Interventions  Tylenol ordered     Intervention Category Intermediate Interventions: Pain - evaluation and management  Chloe Miyoshi G Samaya Boardley 08/20/2021, 10:09 PM

## 2021-08-20 NOTE — Plan of Care (Signed)

## 2021-08-20 NOTE — Progress Notes (Signed)
  Echocardiogram 2D Echocardiogram has been performed.  [name] [name] 08/20/2021, 9:19 AM

## 2021-08-20 NOTE — Plan of Care (Signed)
  Problem: Education: Goal: Knowledge of General Education information will improve Description: Including pain rating scale, medication(s)/side effects and non-pharmacologic comfort measures Outcome: Progressing   Problem: Clinical Measurements: Goal: Ability to maintain clinical measurements within normal limits will improve Outcome: Progressing Goal: Respiratory complications will improve Outcome: Progressing Goal: Cardiovascular complication will be avoided Outcome: Progressing   Problem: Activity: Goal: Risk for activity intolerance will decrease Outcome: Progressing   Problem: Nutrition: Goal: Adequate nutrition will be maintained Outcome: Progressing   Problem: Coping: Goal: Level of anxiety will decrease Outcome: Progressing

## 2021-08-20 NOTE — Progress Notes (Signed)
Case reviewed. Will f/u echo and fluid fluid studies. If either (A) fluid is infected or (B) appears to be causing restrictive physiology on heart will probably do trial of pigtail in L space after discuss with TCTS.  Erskine Emery MD PCCM

## 2021-08-21 DIAGNOSIS — Z452 Encounter for adjustment and management of vascular access device: Secondary | ICD-10-CM | POA: Diagnosis not present

## 2021-08-21 DIAGNOSIS — R579 Shock, unspecified: Secondary | ICD-10-CM | POA: Diagnosis not present

## 2021-08-21 DIAGNOSIS — J90 Pleural effusion, not elsewhere classified: Secondary | ICD-10-CM | POA: Diagnosis not present

## 2021-08-21 LAB — CBC WITH DIFFERENTIAL/PLATELET
Abs Immature Granulocytes: 0.01 10*3/uL (ref 0.00–0.07)
Basophils Absolute: 0 10*3/uL (ref 0.0–0.1)
Basophils Relative: 1 %
Eosinophils Absolute: 0.1 10*3/uL (ref 0.0–0.5)
Eosinophils Relative: 1 %
HCT: 35.8 % — ABNORMAL LOW (ref 39.0–52.0)
Hemoglobin: 11.1 g/dL — ABNORMAL LOW (ref 13.0–17.0)
Immature Granulocytes: 0 %
Lymphocytes Relative: 14 %
Lymphs Abs: 0.8 10*3/uL (ref 0.7–4.0)
MCH: 28.3 pg (ref 26.0–34.0)
MCHC: 31 g/dL (ref 30.0–36.0)
MCV: 91.3 fL (ref 80.0–100.0)
Monocytes Absolute: 0.6 10*3/uL (ref 0.1–1.0)
Monocytes Relative: 10 %
Neutro Abs: 4.3 10*3/uL (ref 1.7–7.7)
Neutrophils Relative %: 74 %
Platelets: 164 10*3/uL (ref 150–400)
RBC: 3.92 MIL/uL — ABNORMAL LOW (ref 4.22–5.81)
RDW: 12 % (ref 11.5–15.5)
WBC: 5.8 10*3/uL (ref 4.0–10.5)
nRBC: 0 % (ref 0.0–0.2)

## 2021-08-21 LAB — COMPREHENSIVE METABOLIC PANEL
ALT: 14 U/L (ref 0–44)
AST: 15 U/L (ref 15–41)
Albumin: 2.8 g/dL — ABNORMAL LOW (ref 3.5–5.0)
Alkaline Phosphatase: 41 U/L (ref 38–126)
Anion gap: 0 — ABNORMAL LOW (ref 5–15)
BUN: 8 mg/dL (ref 8–23)
CO2: 45 mmol/L — ABNORMAL HIGH (ref 22–32)
Calcium: 8.4 mg/dL — ABNORMAL LOW (ref 8.9–10.3)
Chloride: 90 mmol/L — ABNORMAL LOW (ref 98–111)
Creatinine, Ser: 0.84 mg/dL (ref 0.61–1.24)
GFR, Estimated: >60 mL/min (ref >60)
Glucose, Bld: 97 mg/dL (ref 70–99)
Potassium: 4 mmol/L (ref 3.5–5.1)
Sodium: 135 mmol/L (ref 135–145)
Total Bilirubin: 1 mg/dL (ref 0.3–1.2)
Total Protein: 5.3 g/dL — ABNORMAL LOW (ref 6.5–8.1)

## 2021-08-21 LAB — COOXEMETRY PANEL
Carboxyhemoglobin: 1.5 % (ref 0.5–1.5)
Carboxyhemoglobin: 1.5 % (ref 0.5–1.5)
Methemoglobin: 0.9 % (ref 0.0–1.5)
Methemoglobin: 1.1 % (ref 0.0–1.5)
O2 Saturation: 94.3 %
O2 Saturation: 84.7 %
Total hemoglobin: 11.3 g/dL — ABNORMAL LOW (ref 12.0–16.0)
Total hemoglobin: 11 g/dL — ABNORMAL LOW (ref 12.0–16.0)

## 2021-08-21 LAB — MAGNESIUM: Magnesium: 1.8 mg/dL (ref 1.7–2.4)

## 2021-08-21 LAB — CORTISOL: Cortisol, Plasma: 9.3 ug/dL

## 2021-08-21 MED ORDER — LACTATED RINGERS IV BOLUS: 500.0000 mL | Freq: Once | INTRAVENOUS | Status: DC

## 2021-08-21 MED ORDER — COSYNTROPIN 0.25 MG IJ SOLR
0.2500 mg | Freq: Once | INTRAMUSCULAR | Status: AC
  Administered 2021-08-22: 0.25 mg via INTRAVENOUS
  Filled 2021-08-21 (×2): qty 0.25

## 2021-08-21 MED ORDER — MIDODRINE HCL 5 MG PO TABS
10.0000 mg | ORAL_TABLET | Freq: Three times a day (TID) | ORAL | Status: DC
  Administered 2021-08-21 – 2021-08-22 (×3): 10 mg via ORAL
  Filled 2021-08-21 (×3): qty 2

## 2021-08-21 NOTE — Progress Notes (Signed)
Patient resting comfortably on 3L [place]. No respiratory distress noted. BIPAP not needed at this time. RT will monitor as needed.

## 2021-08-21 NOTE — Plan of Care (Signed)
  Problem: Education: Goal: Knowledge of General Education information will improve Description: Including pain rating scale, medication(s)/side effects and non-pharmacologic comfort measures Outcome: Progressing   Problem: Health Behavior/Discharge Planning: Goal: Ability to manage health-related needs will improve Outcome: Progressing   Problem: Clinical Measurements: Goal: Respiratory complications will improve Outcome: Progressing Goal: Cardiovascular complication will be avoided Outcome: Progressing   Problem: Activity: Goal: Risk for activity intolerance will decrease Outcome: Progressing   Problem: Nutrition: Goal: Adequate nutrition will be maintained Outcome: Progressing   Problem: Coping: Goal: Level of anxiety will decrease Outcome: Progressing   Problem: Elimination: Goal: Will not experience complications related to urinary retention Outcome: Progressing   Problem: Pain Managment: Goal: General experience of comfort will improve Outcome: Progressing   Problem: Safety: Goal: Ability to remain free from injury will improve Outcome: Progressing   Problem: Skin Integrity: Goal: Risk for impaired skin integrity will decrease Outcome: Progressing

## 2021-08-21 NOTE — Progress Notes (Signed)
  Subjective: "I feel Ok"  Objective: Vital signs in last 24 hours: Temp:  [97.9 F (36.6 C)-99.1 F (37.3 C)] 97.9 F (36.6 C) (09/04 0751) Pulse Rate:  [65-97] 77 (09/04 0700) Cardiac Rhythm: Normal sinus rhythm;Heart block (09/04 0400) Resp:  [12-24] 18 (09/04 0700) BP: (80-133)/(46-92) 97/66 (09/04 0700) SpO2:  [95 %-100 %] 100 % (09/04 0700) Arterial Line BP: (65-141)/(44-76) 129/65 (09/04 0700) Weight:  [55.8 kg] 55.8 kg (09/04 0442)  Hemodynamic parameters for last 24 hours: CVP:  [4 mmHg-19 mmHg] 9 mmHg  Intake/Output from previous day: 09/03 0701 - 09/04 0700 In: 871.5 [P.O.:240; I.V.:551; IV Piggyback:80.5] Out: 1975 [[id number]] Intake/Output this shift: No intake/output data recorded.  General appearance: cooperative and chronically ill appearing Heart: regular rate and rhythm Lungs: right lung clear  Lab Results: Recent Labs    08/20/21 0415 08/21/21 0622  WBC 7.5 5.8  HGB 11.7* 11.1*  HCT 38.0* 35.8*  PLT 149* 164   BMET:  Recent Labs    08/18/21 1941 08/19/21 0147 08/20/21 0059 08/20/21 0415  NA 126*  --  132* 132*  K 4.5  --  4.2 4.0  CL 86*  --   --  89*  CO2 32  --   --  37*  GLUCOSE 94  --   --  96  BUN 13  --   --  17  CREATININE 0.82 0.90  --  1.17  CALCIUM 9.3  --   --  8.7*    PT/INR: No results for input(s): LABPROT, INR in the last 72 hours. ABG    Component Value Date/Time   PHART 7.275 (L) 08/20/2021 0059   HCO3 39.2 (H) 08/20/2021 0059   TCO2 42 (H) 08/20/2021 0059   O2SAT 96.0 08/20/2021 0059   CBG (last 3)  Recent Labs    08/20/21 0044 08/20/21 0409 08/20/21 1605  GLUCAP 87 86 72    Assessment/Plan: S/P  - Cultures from left pleural fluid showed no organisms on GS and no growth Nothing to suggest an infection of the pleural space Will sign off, please call if there are any other concerns re: possible infection   LOS: 1 day    Melrose Nakayama 08/21/2021

## 2021-08-21 NOTE — Progress Notes (Signed)
NAMEJovanny Stephanie, MRN:  [id number], DOB:  02-16-1951, LOS: 1 ADMISSION DATE:  08/20/2021, CONSULTATION DATE:   9/2 REFERRING MD:  [name], REASON FOR CONSULT:  Left sided pleural effusion/empyema  History of Present Illness:  Jamaal Mellinger is  70 y.o. M  who is seen in consultation at the request of Dr. [name] for recommendations on further evaluation and management of left sided pleural effusion/empyema. The patient presented to [facility] ED  on 9/1 with complaints of shortness of breath, lower extremity edema, and abdominal bloating. The patient was admitted to Hospitalist service.   He has a pertinent past medical history of squamous cell lung cancer (s/p pneumonectomy (2005) and chemo with adjuvant cisplatin/vinorelbine), HLD, HTN   On 9/1 he was seen by cardiology and sent to ED for diuresis after an ECHO was obtained that was concerning for pulmonary HTN and RV failure. He He endorsed several months of progressive dyspnea on exertion with increasing LLE edema over the past two weeks. He presented to the ED. Ed workup was notable for SPO2 of 83%, NA 126, BNP 189. CT chest demonstrated a large left sided pleural effusion.    On 9/2 Pulmonology was consulted and recommended CT surgery consult. IR completed a thoracentesis with 96m of thick consistency pleural fluid removed with a thick calcified rind encasing fluid. [facility] having a new [facility] requirement and for a CTTS consult.     Pertinent  Medical History  squamous cell lung cancer (s/p pneumonectomy (2005) and chemo with adjuvant cisplatin/vinorelbine), HLD, HTN  Significant Hospital Events: Including procedures, antibiotic start and stop dates in addition to other pertinent events   [facility] [place] [facility] 900[facility] removed, new levophed requirement[facility] Interim History / Subjective:  Remains on pressors. Slept okay. In good  spirits. On further history notes 20 lb weight loss in past year. Also has been having feeling of food getting "stuck" making him less willing to eat. Echo reviewed.  Objective   Blood pressure 124/73, pulse 82, temperature 97.9 F (36.6 C), temperature source Oral, resp. rate (!) 22, height 5' 9.02" (1.753 m), weight 55.8 kg, SpO2 100 %. CVP:  [4 mmHg-19 mmHg] 9 mmHg      Intake/Output Summary (Last 24 hours) at 08/21/2021 1038 Last data filed at 08/21/2021 0700 Gross per 24 hour  Intake 871.52 ml  Output 1975 ml  Net -1103.48 ml    Filed Weights   08/20/21 0040 08/20/21 0332 08/21/21 0442  Weight: 55.8 kg 55.8 kg 55.8 kg    Examination: Thin man in NAD No ext edema Heart sounds regular Absent breath sounds on L, not that much better on R honestly Breathing comfortably on a couple liters O2 Mentating and conversing appropriately Moves all 4 ext  Pleural fluid neg Echo confirms PAH and no mass effect from effusion  Resolved Hospital Problem list     Assessment & Plan:  Acute on chronic hypercapneic respiratory failure- resolved Severe emphysema Prior pneumonectomy Unintentional weight loss, dysphagia- on review of his prior weights he's actually around same weight as 18 months ago at his oncology office visit; he had prior workup for this back in 2020 with CT A/P neg for structural issues Severe PAH- this is probably just progressive group 3 looking at how bad his emphysema has gotten; may ask CHF team for RHC at some point to see if any e/o superimposed group 1 Hx  HTN, HLD Shock- etiology of this is still a bit unclear to me; coox this am does not make sense, will repeat.  Septic ruled out, obstructive ruled out.  R/o adrenal.  Does not appear volume down.  - Nonurgent esophagram and SLP eval - Start midodrine, wean levophed for MAP 65 - Stop abx - Check cortisol level, do stim test if low - Check AM CXR, if looks like there is still some mediastinal shift I may drain  some more fluid therapeutically  Best Practice (right click and "Reselect all SmartList Selections" daily)   Diet/type: reg DVT prophylaxis: prophylactic heparin  GI prophylaxis: PPI Lines: N/A Foley:  N/A Code Status:  full code Last date of multidisciplinary goals of care discussion [n/a]   Patient critically ill due to shock Interventions to address this today levophed titration Risk of deterioration without these interventions is high  I personally spent 34 minutes providing critical care not including any separately billable procedures  Erskine Emery MD [facility] [facility]  Prefer epic messenger for cross cover needs If after hours, please call E-link

## 2021-08-21 NOTE — Evaluation (Signed)
Clinical/Bedside Swallow Evaluation Patient Details  Name: [name] [name] MRN: [id number] Date of Birth: 07-Oct-1951  Today's Date: 08/21/2021 Time: SLP Start Time (ACUTE ONLY): 50 SLP Stop Time (ACUTE ONLY): 1550 SLP Time Calculation (min) (ACUTE ONLY): 15 min  Past Medical History:  Past Medical History:  Diagnosis Date   Cancer ([place])    lung   CHF (congestive heart failure) ([place])    Diabetes mellitus without complication ([place])    Dyspnea    Hyperlipidemia    Hypertension    Past Surgical History:  Past Surgical History:  Procedure Laterality Date   COLONOSCOPY WITH PROPOFOL N/A 04/20/2021   Procedure: COLONOSCOPY WITH PROPOFOL;  Surgeon: Toledo, Benay Pike, MD;  Location: [facility] ENDOSCOPY;  Service: Gastroenterology;  Laterality: N/A;   ESOPHAGOGASTRODUODENOSCOPY N/A 04/20/2021   Procedure: ESOPHAGOGASTRODUODENOSCOPY (EGD);  Surgeon: Toledo, Benay Pike, MD;  Location: [facility] ENDOSCOPY;  Service: Gastroenterology;  Laterality: N/A;   LUNG REMOVAL, PARTIAL     HPI:  Pt is a 70 y.o. male  who presented to cardiology clinic for evaluation of lower extremity edema and shortness of breath. Pt found to have severe DOE and SOB/ echo noted severe pulhtn and acute right sided heart failure and was send to the ED by cardiologist. CT chest on 9/2: No acute intrathoracic pathology. Status post prior left pneumonectomy with fluid-filled left hemithorax. Pt s/p thoracentesis 9/2 with 953m of thick consistency pleural fluid removed. Pt with 20 lb weight loss in past year and report of "food getting "stuck" making him less willing to eat". Esophagram ordered, SLP consulted, ad pt placed on clear liquid diet. 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   Assessment / Plan / Recommendation Clinical Impression  Pt was seen for bedside swallow evaluation and he denied a history of dysphagia. Pt  endorsed weight loss, but attributed it to not eating like he should and not due to any underlying difficulty swallowing. [facility] mechanism [id number] adequate. He tolerated all solids and liquids without signs or symptoms of oropharyngeal dysphagia. A regular texture diet with thin liquids is recommended at this time and SLP will follow briefly to ensure tolerance. SLP Visit Diagnosis: Dysphagia, unspecified (R13.10)    Aspiration Risk  No limitations    Diet Recommendation Free water protocol after oral care;Thin liquid   Liquid Administration via: Cup;Straw Medication Administration: Whole meds with liquid Supervision: Patient able to self feed Compensations: Slow rate;Small sips/bites Postural Changes: Seated upright at 90 degrees    Other  Recommendations Oral Care Recommendations: Oral care BID   Follow up Recommendations  (TBD)      Frequency and Duration min 2x/week  1 week       Prognosis Prognosis for Safe Diet Advancement: Good      Swallow Study   General Date of Onset: 08/20/21 HPI: Pt is a 70y.o. male  who presented to cardiology clinic for evaluation of lower extremity edema and shortness of breath. Pt found to have severe DOE and SOB/ echo noted severe pulhtn and acute right sided heart failure and was send to the ED by cardiologist. CT chest on 9/2: No acute intrathoracic pathology. Status post prior left pneumonectomy with fluid-filled left hemithorax. Pt s/p thoracentesis 9/2 with 9026mof thick consistency pleural fluid removed. Pt with 20 lb weight loss in past year and report of "food getting "stuck" making him less willing to eat". Esophagram ordered, SLP consulted, ad pt  placed on clear liquid diet. 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 Type of Study: Bedside Swallow Evaluation Previous Swallow Assessment: none Diet Prior to  this Study: Thin liquids (clear liquids) Temperature Spikes Noted: No Respiratory Status: Nasal cannula History of Recent Intubation: No Behavior/Cognition: Alert;Pleasant mood;Cooperative Oral Cavity Assessment: Within Functional Limits Oral Care Completed by SLP: No Oral Cavity - Dentition: Adequate natural dentition;Missing dentition Vision: Functional for self-feeding Self-Feeding Abilities: Needs assist Patient Positioning: Upright in bed;Postural control adequate for testing Baseline Vocal Quality: Normal Volitional Cough: Strong Volitional Swallow: Able to elicit    Oral/Motor/Sensory Function Overall Oral Motor/Sensory Function: Within functional limits   Ice Chips Ice chips: Within functional limits Presentation: Spoon   Thin Liquid Thin Liquid: Within functional limits Presentation: Straw    Nectar Thick Nectar Thick Liquid: Not tested   Honey Thick Honey Thick Liquid: Not tested   Puree Puree: Within functional limits Presentation: Spoon   Solid     Solid: Within functional limits Presentation: [address] Negus, [place], [place] Office number [phone] Pager [phone]  Horton Marshall 08/21/2021,4:15 PM

## 2021-08-21 NOTE — H&P (Addendum)
See consult note by Hillery Aldo.

## 2021-08-22 ENCOUNTER — Inpatient Hospital Stay (HOSPITAL_COMMUNITY): Payer: Medicare Other

## 2021-08-22 DIAGNOSIS — R0602 Shortness of breath: Secondary | ICD-10-CM | POA: Diagnosis not present

## 2021-08-22 DIAGNOSIS — Z9889 Other specified postprocedural states: Secondary | ICD-10-CM

## 2021-08-22 LAB — POCT I-STAT 7, (LYTES, BLD GAS, ICA,H+H)
Acid-Base Excess: 9 mmol/L — ABNORMAL HIGH (ref 0.0–2.0)
Acid-Base Excess: 16 mmol/L — ABNORMAL HIGH (ref 0.0–2.0)
Acid-Base Excess: 18 mmol/L — ABNORMAL HIGH (ref 0.0–2.0)
Acid-Base Excess: 17 mmol/L — ABNORMAL HIGH (ref 0.0–2.0)
Acid-Base Excess: 16 mmol/L — ABNORMAL HIGH (ref 0.0–2.0)
Bicarbonate: 37.8 mmol/L — ABNORMAL HIGH (ref 20.0–28.0)
Bicarbonate: 47.9 mmol/L — ABNORMAL HIGH (ref 20.0–28.0)
Bicarbonate: 47.4 mmol/L — ABNORMAL HIGH (ref 20.0–28.0)
Bicarbonate: 45.3 mmol/L — ABNORMAL HIGH (ref 20.0–28.0)
Bicarbonate: 44.3 mmol/L — ABNORMAL HIGH (ref 20.0–28.0)
Calcium, Ion: 1.18 mmol/L (ref 1.15–1.40)
Calcium, Ion: 1.3 mmol/L (ref 1.15–1.40)
Calcium, Ion: 1.23 mmol/L (ref 1.15–1.40)
Calcium, Ion: 1.24 mmol/L (ref 1.15–1.40)
Calcium, Ion: 1.25 mmol/L (ref 1.15–1.40)
HCT: 36 % — ABNORMAL LOW (ref 39.0–52.0)
HCT: 37 % — ABNORMAL LOW (ref 39.0–52.0)
HCT: 33 % — ABNORMAL LOW (ref 39.0–52.0)
HCT: 35 % — ABNORMAL LOW (ref 39.0–52.0)
HCT: 33 % — ABNORMAL LOW (ref 39.0–52.0)
Hemoglobin: 12.2 g/dL — ABNORMAL LOW (ref 13.0–17.0)
Hemoglobin: 12.6 g/dL — ABNORMAL LOW (ref 13.0–17.0)
Hemoglobin: 11.2 g/dL — ABNORMAL LOW (ref 13.0–17.0)
Hemoglobin: 11.9 g/dL — ABNORMAL LOW (ref 13.0–17.0)
Hemoglobin: 11.2 g/dL — ABNORMAL LOW (ref 13.0–17.0)
O2 Saturation: 96 %
O2 Saturation: 99 %
O2 Saturation: 96 %
O2 Saturation: 95 %
O2 Saturation: 94 %
Patient temperature: 98.5
Patient temperature: 99.1
Patient temperature: 98.9
Patient temperature: 98.7
Patient temperature: 99.9
Potassium: 3.7 mmol/L (ref 3.5–5.1)
Potassium: 4.8 mmol/L (ref 3.5–5.1)
Potassium: 4.5 mmol/L (ref 3.5–5.1)
Potassium: 4.5 mmol/L (ref 3.5–5.1)
Potassium: 4.4 mmol/L (ref 3.5–5.1)
Sodium: 133 mmol/L — ABNORMAL LOW (ref 135–145)
Sodium: 133 mmol/L — ABNORMAL LOW (ref 135–145)
Sodium: 132 mmol/L — ABNORMAL LOW (ref 135–145)
Sodium: 133 mmol/L — ABNORMAL LOW (ref 135–145)
Sodium: 135 mmol/L (ref 135–145)
TCO2: 40 mmol/L — ABNORMAL HIGH (ref 22–32)
TCO2: >50 mmol/L — ABNORMAL HIGH (ref 22–32)
TCO2: 50 mmol/L — ABNORMAL HIGH (ref 22–32)
TCO2: 47 mmol/L — ABNORMAL HIGH (ref 22–32)
TCO2: 47 mmol/L — ABNORMAL HIGH (ref 22–32)
pCO2 arterial: 71.5 mmHg (ref 32.0–48.0)
pCO2 arterial: 104.5 mmHg (ref 32.0–48.0)
pCO2 arterial: 86.7 mmHg (ref 32.0–48.0)
pCO2 arterial: 74.5 mmHg (ref 32.0–48.0)
pCO2 arterial: 78.1 mmHg (ref 32.0–48.0)
pH, Arterial: 7.331 — ABNORMAL LOW (ref 7.350–7.450)
pH, Arterial: 7.27 — ABNORMAL LOW (ref 7.350–7.450)
pH, Arterial: 7.346 — ABNORMAL LOW (ref 7.350–7.450)
pH, Arterial: 7.392 (ref 7.350–7.450)
pH, Arterial: 7.365 (ref 7.350–7.450)
pO2, Arterial: 93 mmHg (ref 83.0–108.0)
pO2, Arterial: 145 mmHg — ABNORMAL HIGH (ref 83.0–108.0)
pO2, Arterial: 93 mmHg (ref 83.0–108.0)
pO2, Arterial: 79 mmHg — ABNORMAL LOW (ref 83.0–108.0)
pO2, Arterial: 82 mmHg — ABNORMAL LOW (ref 83.0–108.0)

## 2021-08-22 LAB — BASIC METABOLIC PANEL
Anion gap: 1 — ABNORMAL LOW (ref 5–15)
BUN: 10 mg/dL (ref 8–23)
CO2: 45 mmol/L — ABNORMAL HIGH (ref 22–32)
Calcium: 8.7 mg/dL — ABNORMAL LOW (ref 8.9–10.3)
Chloride: 88 mmol/L — ABNORMAL LOW (ref 98–111)
Creatinine, Ser: 0.92 mg/dL (ref 0.61–1.24)
GFR, Estimated: >60 mL/min (ref >60)
Glucose, Bld: 88 mg/dL (ref 70–99)
Potassium: 4.9 mmol/L (ref 3.5–5.1)
Sodium: 134 mmol/L — ABNORMAL LOW (ref 135–145)

## 2021-08-22 LAB — CBC
HCT: 36.9 % — ABNORMAL LOW (ref 39.0–52.0)
Hemoglobin: 11 g/dL — ABNORMAL LOW (ref 13.0–17.0)
MCH: 28.1 pg (ref 26.0–34.0)
MCHC: 29.8 g/dL — ABNORMAL LOW (ref 30.0–36.0)
MCV: 94.1 fL (ref 80.0–100.0)
Platelets: 141 10*3/uL — ABNORMAL LOW (ref 150–400)
RBC: 3.92 MIL/uL — ABNORMAL LOW (ref 4.22–5.81)
RDW: 12.2 % (ref 11.5–15.5)
WBC: 5.5 10*3/uL (ref 4.0–10.5)
nRBC: 0 % (ref 0.0–0.2)

## 2021-08-22 LAB — MAGNESIUM: Magnesium: 1.9 mg/dL (ref 1.7–2.4)

## 2021-08-22 LAB — GLUCOSE, CAPILLARY
Glucose-Capillary: 84 mg/dL (ref 70–99)
Glucose-Capillary: 107 mg/dL — ABNORMAL HIGH (ref 70–99)
Glucose-Capillary: 144 mg/dL — ABNORMAL HIGH (ref 70–99)
Glucose-Capillary: 175 mg/dL — ABNORMAL HIGH (ref 70–99)
Glucose-Capillary: 106 mg/dL — ABNORMAL HIGH (ref 70–99)
Glucose-Capillary: 130 mg/dL — ABNORMAL HIGH (ref 70–99)

## 2021-08-22 MED ORDER — CHLORHEXIDINE GLUCONATE 0.12 % MT SOLN
15.0000 mL | Freq: Two times a day (BID) | OROMUCOSAL | Status: DC
  Administered 2021-08-23 (×2): 15 mL via OROMUCOSAL
  Filled 2021-08-22: qty 15

## 2021-08-22 MED ORDER — MIDODRINE HCL 5 MG PO TABS
5.0000 mg | ORAL_TABLET | Freq: Three times a day (TID) | ORAL | Status: DC
  Administered 2021-08-22 – 2021-08-23 (×3): 5 mg via ORAL
  Filled 2021-08-22 (×3): qty 1

## 2021-08-22 MED ORDER — ORAL CARE MOUTH RINSE
15.0000 mL | Freq: Two times a day (BID) | OROMUCOSAL | Status: DC
  Administered 2021-08-23: 15 mL via OROMUCOSAL

## 2021-08-22 MED ORDER — METOPROLOL TARTRATE 5 MG/5ML IV SOLN
5.0000 mg | Freq: Four times a day (QID) | INTRAVENOUS | Status: DC | PRN
  Administered 2021-08-22 – 2021-08-24 (×3): 5 mg via INTRAVENOUS
  Filled 2021-08-22 (×3): qty 5

## 2021-08-22 MED ORDER — SODIUM CHLORIDE 0.9% FLUSH
10.0000 mL | Freq: Two times a day (BID) | INTRAVENOUS | Status: DC
  Administered 2021-08-23 (×2): 10 mL
  Administered 2021-08-23: 30 mL
  Administered 2021-08-24: 20 mL

## 2021-08-22 MED ORDER — SODIUM CHLORIDE 0.9% FLUSH: 10.0000 mL | INTRAVENOUS | Status: DC | PRN

## 2021-08-22 NOTE — Progress Notes (Signed)
Critical ABG values given to Dr. Charlsie Quest, CCM.

## 2021-08-22 NOTE — Progress Notes (Signed)
   08/22/21 0500  Charting Type  Charting Type Reassessment  Neurological  Level of Consciousness Alert  Neuro (WDL) WDL  Orientation Level Oriented X4  Cognition Follows commands;No memory impairment  Speech Clear  Pupil Assessment  Yes  R Pupil Size (mm) 3  R Pupil Shape Round  R Pupil Reaction Brisk  L Pupil Size (mm) 3  L Pupil Shape Round  L Pupil Reaction Brisk  RUE Motor Response Tremors  LUE Motor Response Tremors  Neuro Symptoms Tremors  Glasgow Coma Scale  Eye Opening 4  Best Verbal Response (NON-intubated) 5  Best Motor Response 6  Glasgow Coma Scale Score 15   Patient also Hypertensive and c/o his head feeling strange. Focused assessment and am labs collected. Plan to notify Elink/MD on call for recommendations. Thank you. Please see flow sheet for details.

## 2021-08-22 NOTE — Progress Notes (Signed)
Called daughter to update. Will have her come in tomorrow at 10AM so we can have a [place] discussion.  Erskine Emery MD PCCM

## 2021-08-22 NOTE — Procedures (Signed)
Thoracentesis  Procedure Note  [name] [name]  [id number]  [birth year]  Date:08/22/21  Time:8:44 AM   Provider Performing:Lataya Varnell C Tamala Julian   Procedure: Thoracentesis with imaging guidance (70263)  Indication(s) Pleural Effusion  Consent Risks of the procedure as well as the alternatives and risks of each were explained to the patient and/or caregiver.  Consent for the procedure was obtained and is signed in the bedside chart  Anesthesia Topical only with 1% lidocaine    Time Out Verified patient identification, verified procedure, site/side was marked, verified correct patient position, special equipment/implants available, medications/allergies/relevant history reviewed, required imaging and test results available.   Sterile Technique Maximal sterile technique including full sterile barrier drape, hand hygiene, sterile gown, sterile gloves, mask, hair covering, sterile ultrasound probe cover (if used).  Procedure Description Ultrasound was used to identify appropriate pleural anatomy for placement and overlying skin marked.  Area of drainage cleaned and draped in sterile fashion. Lidocaine was used to anesthetize the skin and subcutaneous tissue.  500 cc's of dark bloody appearing fluid was drained from the left pleural space. Patient then complained of pleurisy so procedure ended.  Catheter then removed and bandaid applied to site.   Complications/Tolerance None; patient tolerated the procedure well. Chest X-ray is ordered to confirm no post-procedural complication.   EBL Minimal   Specimen(s) None

## 2021-08-22 NOTE — Progress Notes (Addendum)
NAMENader [name], MRN:  [id number], DOB:  [birth year], LOS: 2 ADMISSION DATE:  08/20/2021, CONSULTATION DATE:   9/2 REFERRING MD:  [name], REASON FOR CONSULT:  Left sided pleural effusion/empyema  History of Present Illness:  [name] [name] is  70 y.o. M  who is seen in consultation at the request of Dr. [name] for recommendations on further evaluation and management of left sided pleural effusion/empyema. The patient presented to [facility] ED  on 9/1 with complaints of shortness of breath, lower extremity edema, and abdominal bloating. The patient was admitted to Hospitalist service.   He has a pertinent past medical history of squamous cell lung cancer (s/p pneumonectomy (2005) and chemo with adjuvant cisplatin/vinorelbine), HLD, HTN   On 9/1 he was seen by cardiology and sent to ED for diuresis after an ECHO was obtained that was concerning for pulmonary HTN and RV failure. He He endorsed several months of progressive dyspnea on exertion with increasing LLE edema over the past two weeks. He presented to the ED. Ed workup was notable for SPO2 of 83%, NA 126, BNP 189. CT chest demonstrated a large left sided pleural effusion.    On 9/2 Pulmonology was consulted and recommended CT surgery consult. IR completed a thoracentesis with 925m of thick consistency pleural fluid removed with a thick calcified rind encasing fluid. [facility] having a new [facility] and for a CTTS consult.     Pertinent  Medical History  squamous cell lung cancer (s/p pneumonectomy (2005) and chemo with adjuvant cisplatin/vinorelbine), HLD, HTN  Significant Hospital Events: Including procedures, antibiotic start and stop dates in addition to other pertinent events   [facility] with 900cc removed, new [facility]hed requirement[facility] Inter[facility] History / Subjective:  Off pressors. Breathing a bit more labored this  AM. Willing to try another therapeutic thora.  Objective   Blood pressure 120/79, pulse 86, temperature 99.1 F (37.3 C), temperature source Oral, resp. rate (!) 26, height 5' 9.02" (1.753 m), weight 52.9 kg, SpO2 100 %. CVP:  [0 mmHg-24 mmHg] 4 mmHg      Intake/Output Summary (Last 24 hours) at 08/22/2021 0813 Last data filed at 08/22/2021 0610 Gross per 24 hour  Intake 1168.84 ml  Output 1275 ml  Net -106.16 ml    Filed Weights   08/20/21 0332 08/21/21 0442 08/22/21 0415  Weight: 55.8 kg 55.8 kg 52.9 kg    Examination: Thin man in NAD No ext edema Heart sounds regular Absent breath sounds on L, severely diminished on R Breathing more labored this AM with some accessory muscle use Mentating and conversing appropriately Moves all 4 ext  Pleural fluid neg Echo confirms PAH and no mass effect from effusion  Resolved Hospital Problem list     Assessment & Plan:  Acute on chronic hypercapneic respiratory failure- issue again Severe emphysema Prior pneumonectomy- some mediasitnal shift, will see if this is affecting his CO2 retention; [facility], [facility]- on review of his prior weights he's actually around same weight as 18 months ago at his oncology office visit; he had prior workup for this back in 2020 with CT A/P neg for structural issues.  No e/o swallowing trouble on SLP eval 9/4. Severe PAH- this is probably just progressive group 3 looking at how bad his emphysema has gotten; may ask CHF team for ROsmondat some point to  see if any e/o superimposed group 1; this can probably wait until OP Hx HTN, HLD Shock- resolved with low dose midodrine  - Nonurgent esophagram - Drop midodrine to 72m TID qAC - f/u cortisol stim test - Will try a therapeutic thora today to see if we can help his CO2 retention issues by offloading any effect of pneumonectomy fluid collection on R lung - Keep in ICU today  Best Practice  (right click and "Reselect all SmartList Selections" daily)   Diet/type: reg DVT prophylaxis: prophylactic heparin  GI prophylaxis: PPI Lines: CVL, keep for now Foley:  N/A Code Status:  full code Last date of multidisciplinary goals of care discussion- may need palliative involvement will have to see how he does over next day or two   Patient critically ill due to respiratory failure Interventions to address this today intubation watch, therapeutic thora attempt Risk of deterioration without these interventions is high  I personally spent 33 minutes providing critical care not including any separately billable procedures  DErskine EmeryMD [facility]Pulmonary Critical Care  Prefer epic messenger for cross cover needs If after hours, please call E-link

## 2021-08-22 NOTE — Progress Notes (Signed)
[place] Progress Note Patient Name: [name] [name] DOB: Mar 24, 1951 MRN: [id number]   Date of Service  08/22/2021  HPI/Events of Note  Pt had thoracentesis today; remains in resp distress requiring BiPAP  eICU Interventions  Latest abg data is acceptable     Intervention Category Intermediate Interventions: Diagnostic test evaluation  Tilden Dome 08/22/2021, 10:22 PM

## 2021-08-22 NOTE — Progress Notes (Signed)
[place] Progress Note Patient Name: [name] [name] DOB: 1951/11/19 MRN: [id number]   Date of Service  08/22/2021  HPI/Events of Note  RN noted pt to be tremulous, pt felt subjectively weak, pt had trouble holding items in hands, and had HA.    Team gave fruit juice and symptoms resolved.    Team was then able to get a fingerstick with glucose in the 80's only.  Also, pt HTN've despite holding levophed for last 4 hours  eICU Interventions  1) serial fingersticks 2) metoprolol 72m iv q6hrs PRN for a sbp>155 ordered     Intervention Category Major Interventions: Hypertension - evaluation and management  MTilden Dome9/04/2021, 5:59 AM

## 2021-08-23 ENCOUNTER — Inpatient Hospital Stay (HOSPITAL_COMMUNITY): Payer: Medicare Other

## 2021-08-23 DIAGNOSIS — R609 Edema, unspecified: Secondary | ICD-10-CM

## 2021-08-23 DIAGNOSIS — I2721 Secondary pulmonary arterial hypertension: Secondary | ICD-10-CM

## 2021-08-23 LAB — CBC
HCT: 33.2 % — ABNORMAL LOW (ref 39.0–52.0)
Hemoglobin: 10.2 g/dL — ABNORMAL LOW (ref 13.0–17.0)
MCH: 28.7 pg (ref 26.0–34.0)
MCHC: 30.7 g/dL (ref 30.0–36.0)
MCV: 93.5 fL (ref 80.0–100.0)
Platelets: 141 10*3/uL — ABNORMAL LOW (ref 150–400)
RBC: 3.55 MIL/uL — ABNORMAL LOW (ref 4.22–5.81)
RDW: 12.2 % (ref 11.5–15.5)
WBC: 4.8 10*3/uL (ref 4.0–10.5)
nRBC: 0 % (ref 0.0–0.2)

## 2021-08-23 LAB — POCT I-STAT 7, (LYTES, BLD GAS, ICA,H+H)
Acid-Base Excess: 16 mmol/L — ABNORMAL HIGH (ref 0.0–2.0)
Bicarbonate: 45 mmol/L — ABNORMAL HIGH (ref 20.0–28.0)
Calcium, Ion: 1.29 mmol/L (ref 1.15–1.40)
HCT: 33 % — ABNORMAL LOW (ref 39.0–52.0)
Hemoglobin: 11.2 g/dL — ABNORMAL LOW (ref 13.0–17.0)
O2 Saturation: 99 %
Patient temperature: 97.9
Potassium: 4.2 mmol/L (ref 3.5–5.1)
Sodium: 135 mmol/L (ref 135–145)
TCO2: 47 mmol/L — ABNORMAL HIGH (ref 22–32)
pCO2 arterial: 76.1 mmHg (ref 32.0–48.0)
pH, Arterial: 7.378 (ref 7.350–7.450)
pO2, Arterial: 156 mmHg — ABNORMAL HIGH (ref 83.0–108.0)

## 2021-08-23 LAB — BASIC METABOLIC PANEL
Anion gap: 3 — ABNORMAL LOW (ref 5–15)
BUN: 12 mg/dL (ref 8–23)
CO2: 41 mmol/L — ABNORMAL HIGH (ref 22–32)
Calcium: 8.8 mg/dL — ABNORMAL LOW (ref 8.9–10.3)
Chloride: 91 mmol/L — ABNORMAL LOW (ref 98–111)
Creatinine, Ser: 0.79 mg/dL (ref 0.61–1.24)
GFR, Estimated: >60 mL/min (ref >60)
Glucose, Bld: 83 mg/dL (ref 70–99)
Potassium: 4.1 mmol/L (ref 3.5–5.1)
Sodium: 135 mmol/L (ref 135–145)

## 2021-08-23 LAB — HEMOGLOBIN A1C
Hgb A1c MFr Bld: 5.5 % (ref 4.8–5.6)
Mean Plasma Glucose: 111.15 mg/dL

## 2021-08-23 LAB — BODY FLUID CULTURE W GRAM STAIN
Culture: NO GROWTH
Gram Stain: NONE SEEN

## 2021-08-23 LAB — ACTH STIMULATION, 3 TIME POINTS
Cortisol, 30 Min: 39.7 ug/dL
Cortisol, Base: 29.6 ug/dL

## 2021-08-23 LAB — GLUCOSE, CAPILLARY
Glucose-Capillary: 82 mg/dL (ref 70–99)
Glucose-Capillary: 92 mg/dL (ref 70–99)
Glucose-Capillary: 149 mg/dL — ABNORMAL HIGH (ref 70–99)
Glucose-Capillary: 76 mg/dL (ref 70–99)

## 2021-08-23 LAB — MAGNESIUM: Magnesium: 1.7 mg/dL (ref 1.7–2.4)

## 2021-08-23 MED ORDER — SODIUM CHLORIDE 0.9% FLUSH
3.0000 mL | Freq: Two times a day (BID) | INTRAVENOUS | Status: DC
  Administered 2021-08-23 – 2021-08-24 (×3): 3 mL via INTRAVENOUS

## 2021-08-23 MED ORDER — MIDODRINE HCL 5 MG PO TABS: 5.0000 mg | ORAL_TABLET | Freq: Once | ORAL | Status: DC

## 2021-08-23 MED ORDER — SODIUM CHLORIDE 0.9 % IV SOLN
250.0000 mL | INTRAVENOUS | Status: DC | PRN
  Administered 2021-08-23: 500 mL via INTRAVENOUS

## 2021-08-23 MED ORDER — SODIUM CHLORIDE 0.9 % IV SOLN: INTRAVENOUS | Status: DC

## 2021-08-23 MED ORDER — SODIUM CHLORIDE 0.9% FLUSH: 3.0000 mL | INTRAVENOUS | Status: DC | PRN

## 2021-08-23 MED ORDER — MAGNESIUM SULFATE 2 GM/50ML IV SOLN
2.0000 g | Freq: Once | INTRAVENOUS | Status: AC
  Administered 2021-08-23: 2 g via INTRAVENOUS
  Filled 2021-08-23: qty 50

## 2021-08-23 MED ORDER — MIDODRINE HCL 5 MG PO TABS
10.0000 mg | ORAL_TABLET | Freq: Three times a day (TID) | ORAL | Status: DC
  Administered 2021-08-23 – 2021-08-30 (×15): 10 mg via ORAL
  Filled 2021-08-23 (×20): qty 2

## 2021-08-23 MED ORDER — ORAL CARE MOUTH RINSE
15.0000 mL | Freq: Two times a day (BID) | OROMUCOSAL | Status: DC
  Administered 2021-08-23 – 2021-09-16 (×39): 15 mL via OROMUCOSAL

## 2021-08-23 NOTE — Progress Notes (Signed)
Patient resting comfortably with no respiratory distress noted. BIPAP not needed at this time. BIPAP is on standby at bedside. RT will monitor as needed.

## 2021-08-23 NOTE — H&P (View-Only) (Signed)
Advanced Heart Failure Team Consult Note   Primary Physician: Patient, No Pcp Per (Inactive) PCP-Cardiologist:  None  Reason for Consultation: Pulmonary hypertension   HPI:    [name] [name] is seen today for evaluation of pulmonary hypertension at the request of Dr. Ina Homes.  The patient is a 69 y/o male with a medical history significant for squamous cell carcinoma s/p L pneumonectomy in 2005 with prior chemotherapy (cisplatin/vinorelbine), COPD, hyperlipidemia, hypertension and paroxysmal atrial fibrillation who presented to [facility] for evaluation of worsening dyspnea and LE edema over the past several months.  He also endorses abdominal bloating and pressure that worsens his dyspnea as well as dyspnea on activity.    On arrival to the ED, he was hypoxic with oxygen saturations in the 80s.  A chest CT was notable for a large left-sided pleural effusion and he underwent a thoracentesis with 900 mL of thick pleural fluid removed at [facility].  He was hypotensive with SBPs in the 80s, so he was started on Levophed and transferred to [facility].  He underwent a second thoracentesis on 9/5 with the removal of 500 mL of dark, bloody-appearing fluid. Fluid cultures are negative.   He says he does not follow with a cardiologist, but scheduled his first appointment with one this week.  His PCP did recommend an echocardiogram due to his complaints of dyspnea and LLE.  The echo was concerning for right-sided heart failure and pulmonary hypertension.    An echo repeated on 08/20/2021 found an EF of 70 to 75% with hyperdynamic LV function and no WMAs.  RV systolic function is normal, but there is severely elevated pulmonary artery pressure with an RVSP of 63.1 mmHg and an RA pressure of 8 mmHg.  There is also moderate to severe TR.   He was weaned off BiPAP and is now resting comfortably on supplemental oxygen.  On exam, he is sitting up in bed eating lunch.  He complains of mild abdominal pressure, but denies chest  pain, shortness of breath, edema, dizziness and lightheadedness.    Review of Systems: [y] = yes, _0  = no  Negative unless reported in HPI.  General: Weight gain _1 ; Weight loss _2 ; Anorexia _3 ; Fatigue [ y]; Fever _4 ; Chills _5 ; Weakness _6   Cardiac: Chest pain/pressure _7 ; Resting SOB [ y]; Exertional SOB [ y]; Orthopnea _8 ; Pedal Edema _9 ; Palpitations _10 ; Syncope _11 ; Presyncope _12 ; Paroxysmal nocturnal dyspnea_13   Pulmonary: Cough [ y]; Wheezing_14 ; Hemoptysis_15 ; Sputum _16 ; Snoring _17   GI: Vomiting_18 ; Dysphagia_19 ; Melena_20 ; Hematochezia _21 ; Heartburn_22 ; Abdominal pain Blue.Reese ]; Constipation _23 ; Diarrhea _24 ; BRBPR _25   GU: Hematuria_26 ; Dysuria _27 ; Nocturia_28   Vascular: Pain in legs with walking _29 ; Pain in feet with lying flat _30 ; Non-healing sores _31 ; Stroke _32 ; TIA _33 ; Slurred speech _34 ;  Neuro: Headaches_35 ; Vertigo_36 ; Seizures_37 ; Paresthesias_38 ;Blurred vision _39 ; Diplopia _40 ; Vision changes _41   Ortho/Skin: Arthritis Blue.Reese ]; Joint pain [ y]; Muscle pain _42 ; Joint swelling _43 ; Back Pain _44 ; Rash _45   Psych: Depression_46 ; Anxiety_47   Heme: Bleeding problems _48 ; Clotting disorders _49 ; Anemia _50   Endocrine: Diabetes _51 ; Thyroid dysfunction_52   Home Medications Prior to Admission medications   Medication Sig Start Date End Date Taking? Authorizing Provider  acetaminophen (TYLENOL) 325 MG tablet Take 650 mg by mouth every 6 (six) hours as needed for headache or mild pain.   Yes [provider]  albuterol (VENTOLIN HFA) 108 (90 Base) MCG/ACT inhaler Inhale 1-2 puffs into the lungs every 6 (six) hours as needed. 04/04/21  Yes [provider]  ascorbic acid (VITAMIN C) 1000 MG tablet Take 1,000 mg by mouth daily.   Yes [provider]  aspirin 81 MG EC tablet Take 81 mg by mouth daily. 12/30/19  Yes [provider]  carvedilol (COREG) 3.125 MG tablet Take 3.125 mg by mouth 2 (two) times daily. 07/11/21  Yes [provider]  Cholecalciferol 10 MCG (400 UNIT) CAPS Take 400 Units by mouth daily.   Yes [provider]  furosemide (LASIX) 40 MG tablet Take 40 mg by mouth daily. 08/18/21  Yes [provider]  lisinopril-hydrochlorothiazide (ZESTORETIC) 20-12.5 MG tablet Take 1 tablet by mouth daily. 04/26/11  Yes [provider]  Multiple Vitamin (MULTIVITAMIN) capsule Take 1 capsule by mouth daily.   Yes [provider]  Multiple Vitamins-Minerals (THERA-M) TABS Take 1 tablet by mouth daily.   Yes [provider]  pantoprazole (PROTONIX) 40 MG tablet Take 40 mg by mouth daily.   Yes [provider]  piperacillin-tazobactam (ZOSYN) 3.375 GM/50ML IVPB Inject 50 mLs (3.375 g total) into the vein every 8 (eight) hours. 08/19/21  Yes Val Riles, MD  simvastatin (ZOCOR) 20 MG tablet Take 20 mg by mouth daily.   Yes [provider]  simvastatin (ZOCOR) 20 MG tablet Take 10 mg by mouth at bedtime. Patient not taking: No sig reported 04/26/11   [provider]    Past Medical History: Past Medical History:  Diagnosis Date   Cancer ([place])    lung   CHF (congestive heart failure) ([place])    Diabetes mellitus without complication ([place])    Dyspnea    Hyperlipidemia    Hypertension     Past Surgical History: Past Surgical History:  Procedure Laterality Date   COLONOSCOPY WITH PROPOFOL N/A 04/20/2021   Procedure: COLONOSCOPY WITH PROPOFOL;  Surgeon: Toledo, Benay Pike, MD;  Location: [facility] ENDOSCOPY;  Service: Gastroenterology;  Laterality: N/A;   ESOPHAGOGASTRODUODENOSCOPY N/A 04/20/2021   Procedure: ESOPHAGOGASTRODUODENOSCOPY (EGD);  Surgeon: Toledo, Benay Pike, MD;  Location: [facility] ENDOSCOPY;  Service: Gastroenterology;  Laterality: N/A;   LUNG REMOVAL, PARTIAL      Family History: No family history of PAH   Social History: Social History   Socioeconomic History   Marital status: Single    Spouse name: Not on file   Number of children:  Not on file   Years of education: Not on file   Highest education level: Not on file  Occupational History   Not on file  Tobacco Use   Smoking status: Former   Smokeless tobacco: Never  Vaping Use   Vaping Use: Never used  Substance and Sexual Activity   Alcohol use: Yes    Comment: rare   Drug use: Not Currently   Sexual activity: Not Currently  Other Topics Concern   Not on file  Social History Narrative   Not on file   Social Determinants of Health   Financial Resource Strain: Not on file  Food Insecurity: Not on file  Transportation Needs: Not on file  Physical Activity: Not on file  Stress: Not on file  Social Connections: Not on file    Allergies:  No Known Allergies  Objective:  Vital Signs:   Temp:  [97.9 F (36.6 C)-99 F (37.2 C)] 97.9 F (36.6 C) (09/06 0811) Pulse Rate:  [72-94] 77 (09/06 1000) Resp:  [14-40] 18 (09/06 1000) BP: (87-123)/(53-85) 114/85 (09/06 1000) SpO2:  [79 %-100 %] 100 % (09/06 1000) Arterial Line BP: (92-146)/(48-71) 133/71 (09/06 1000) FiO2 (%):  [30 %-40 %] 40 % (09/06 0800) Weight:  [56 kg] 56 kg (09/06 0500) Last BM Date: 08/20/21  Weight change: Filed Weights   08/21/21 0442 08/22/21 0415 08/23/21 0500  Weight: 55.8 kg 52.9 kg 56 kg    Intake/Output:   Intake/Output Summary (Last 24 hours) at 08/23/2021 1125 Last data filed at 08/23/2021 1000 Gross per 24 hour  Intake 871.04 ml  Output 550 ml  Net 321.04 ml      Physical Exam  CVP 4   General:  Elderly male. Extremely thin.  No acute distress  HEENT: normal Neck: supple. JVP to clavicle. Carotids 2+ bilat; no bruits. No lymphadenopathy or thyromegaly appreciated.  LIJ CVC  Cor: PMI nondisplaced. Regular rate & rhythm. No rubs, gallops or murmurs. Lungs: Absent on left (no lung present). Clear on right  Abdomen: soft, nontender, nondistended. No hepatosplenomegaly. No bruits or masses. Good bowel sounds. Extremities: no cyanosis, clubbing, rash, edema. L  radial arterial line  Neuro: alert & orientedx3, cranial nerves grossly intact. moves all 4 extremities w/o difficulty. Affect pleasant   Telemetry   SR in 90s with PVCs.  Personally reviewed   EKG    08/18/2021: SR in 90s, no acute ischemic changes   Labs   Basic Metabolic Panel: Recent Labs  Lab 08/18/21 1941 08/19/21 0147 08/20/21 0059 08/20/21 0415 08/20/21 1003 08/21/21 0622 08/22/21 0500 08/22/21 0808 08/22/21 1009 08/22/21 1415 08/22/21 2122 08/23/21 0600 08/23/21 0920  NA 126*  --    < > 132*   < > 135 134*   < > 132* 133* 135 135 135  K 4.5  --    < > 4.0   < > 4.0 4.9   < > 4.5 4.5 4.4 4.1 4.2  CL 86*  --   --  89*  --  90* 88*  --   --   --   --  91*  --   CO2 32  --   --  37*  --  45* 45*  --   --   --   --  41*  --   GLUCOSE 94  --   --  96  --  97 88  --   --   --   --  83  --   BUN 13  --   --  17  --  8 10  --   --   --   --  12  --   CREATININE 0.82 0.90  --  1.17  --  0.84 0.92  --   --   --   --  0.79  --   CALCIUM 9.3  --   --  8.7*  --  8.4* 8.7*  --   --   --   --  8.8*  --   MG  --   --   --  1.8  --  1.8 1.9  --   --   --   --  1.7  --   PHOS  --   --   --  4.5  --   --   --   --   --   --   --   --   --    < > =  values in this interval not displayed.    Liver Function Tests: Recent Labs  Lab 08/18/21 1941 08/21/21 0622  AST 27 15  ALT 24 14  ALKPHOS 67 41  BILITOT 1.3* 1.0  PROT 7.4 5.3*  ALBUMIN 4.3 2.8*   No results for input(s): LIPASE, AMYLASE in the last 168 hours. No results for input(s): AMMONIA in the last 168 hours.  CBC: Recent Labs  Lab 08/18/21 1941 08/19/21 0147 08/20/21 0413 08/20/21 0415 08/20/21 1003 08/21/21 0622 08/22/21 0500 08/22/21 0808 08/22/21 1009 08/22/21 1415 08/22/21 2122 08/23/21 0600 08/23/21 0920  WBC 5.4   < > 7.7 7.5  --  5.8 5.5  --   --   --   --  4.8  --   NEUTROABS 4.4  --   --   --   --  4.3  --   --   --   --   --   --   --   HGB 15.8   < > 12.3* 11.7*   < > 11.1* 11.0*   < > 11.2*  11.9* 11.2* 10.2* 11.2*  HCT 47.3   < > 39.8 38.0*   < > 35.8* 36.9*   < > 33.0* 35.0* 33.0* 33.2* 33.0*  MCV 86.3   < > 91.9 90.9  --  91.3 94.1  --   --   --   --  93.5  --   PLT 182   < > 154 149*  --  164 141*  --   --   --   --  141*  --    < > = values in this interval not displayed.    Cardiac Enzymes: No results for input(s): CKTOTAL, CKMB, CKMBINDEX, TROPONINI in the last 168 hours.  BNP: BNP (last 3 results) Recent Labs    08/18/21 1941 08/20/21 0414  BNP 189.9* 116.3*    ProBNP (last 3 results) No results for input(s): PROBNP in the last 8760 hours.   CBG: Recent Labs  Lab 08/22/21 1016 08/22/21 1133 08/22/21 1648 08/22/21 2115 08/23/21 0642  GLUCAP 144* 175* 106* 130* 82    Coagulation Studies: No results for input(s): LABPROT, INR in the last 72 hours.   Imaging   No results found.   Medications:     Current Medications:  aspirin  81 mg Oral Daily   chlorhexidine  15 mL Mouth Rinse BID   Chlorhexidine Gluconate Cloth  6 each Topical Q0600   enoxaparin (LOVENOX) injection  40 mg Subcutaneous Q24H   mouth rinse  15 mL Mouth Rinse q12n4p   midodrine  5 mg Oral TID WC   pantoprazole (PROTONIX) IV  40 mg Intravenous QHS   simvastatin  20 mg Oral QHS   sodium chloride flush  10-40 mL Intracatheter Q12H    Infusions:  sodium chloride        Patient Profile   The patient is a 70 y/o male with a medical history significant for squamous cell carcinoma s/p L pneumonectomy in 2005 with prior chemotherapy (cisplatin/vinorelbine), COPD, hyperlipidemia, hypertension and paroxysmal atrial fibrillation who presented to [facility] for evaluation of worsening dyspnea and LE edema over the past several months.  Imaging was notable for a left pleural effusion and he underwent thoracentesis x2.  The HF team is following for evaluation of right-sided heart failure and pulmonary hypertension.   Assessment/Plan   Pulmonary hypertension, group 3 - Secondary to h/o  COPD  - Echo consistent with moderate pulmonary hypertension with  very mild RV strain - Will obtain RHC to quantify pulmonary hypertension and evaluate volume status - Recommend appropriate oxygenation and optimizing volume status - Further recommendations, if any, following RHC  2. Acute on chronic hypoxemic respiratory failure - Secondary to pleural effusion -- s/p thoracentesis x2  - O2 saturations in 80s on arrival - Now improved  - Not volume overloaded on exam   3. Hypotension - Initially required Levophed - Now resolved after initiation of midodrine - Initial concern for septic shock -- received Zosyn x2. Fluid cultures negative   4. Lower extremity edema - Resolved  - Started on Lasix as outpatient - Hold for now given recent hypotension. Currently does not appear volume overloaded   5. COPD - Management per primary team  - Not currently exacerbated  6. H/O hypertension - Home meds include Coreg, lisinopril-HCTZ and Lasix -- held for now due to hypotension   7. Hyponatremia  - Initial Na of 126, now 135 - Now resolved after thoracentesis  Length of Stay: [place], NP  08/23/2021, 11:25 AM  Advanced Heart Failure Team Pager [phone] (M-F; 7a - 5p)  Please contact [facility] for night-coverage after hours (4p -7a ) and weekends on [email or web address]   Patient seen and examined with the above-signed Advanced Practice Provider and/or Housestaff. I personally reviewed laboratory data, imaging studies and relevant notes. I independently examined the patient and formulated the important aspects of the plan. I have edited the note to reflect any of my changes or salient points. I have personally discussed the plan with the patient and/or family.  70 y/o male with h/o COPD, squamous cell lung CA s/p left pneumonectomy in 2005 now admitted with worsening respiratory failure.   CT shows absence of left lung with complete opacification of left hemithorax. He has undergone  thoracentesis x 2  with no change in CXR  Echo with EF 65-70% G1DD. RV mildly dilated with mildly reduced function. No septal flattening. RVSP 66mHG  General:  Thin frail male. Sitting up in bed. NAD HEENT: normal + temporal wasting  Neck: supple. JVP minimally elevated  Carotids 2+ bilat; no bruits. No lymphadenopathy or thryomegaly appreciated. Cor: PMI nondisplaced. Regular rate & rhythm. No rubs, gallops or murmurs. Lungs: clear on R. Decreased on left  Abdomen: soft, nontender, nondistended. No hepatosplenomegaly. No bruits or masses. Good bowel sounds. Extremities: no cyanosis, clubbing, rash, edema, cachetic  Neuro: alert & orientedx3, cranial nerves grossly intact. moves all 4 extremities w/o difficulty. Affect pleasant  No PFTs in chart in care everywhere.  Almost certainly this is WHO Group 3 with moderate PAH on echo. There is minimal RV strain on echo. Limited therapeutic options (O2 is mainstay). Will proceed with RHC to quantify degree of PH and make sure volume status is optimized.   DGlori Bickers MD  1:05 PM

## 2021-08-23 NOTE — Progress Notes (Signed)
NAMEIzyk [name], MRN:  [id number], DOB:  05-Mar-1951, LOS: 3 ADMISSION DATE:  08/20/2021, CONSULTATION DATE:   9/2 REFERRING MD:  [name] [name], REASON FOR CONSULT:  Left sided pleural effusion/empyema  History of Present Illness:  [name] [name] is  70 y.o. M  who is seen in consultation at the request of Dr. Clearence [name] for recommendations on further evaluation and management of left sided pleural effusion/empyema. The patient presented to [facility] ED  on 9/1 with complaints of shortness of breath, lower extremity edema, and abdominal bloating. The patient was admitted to Hospitalist service.   He has a pertinent past medical history of squamous cell lung cancer (s/p pneumonectomy (2005) and chemo with adjuvant cisplatin/vinorelbine), HLD, HTN   On 9/1 he was seen by cardiology and sent to ED for diuresis after an ECHO was obtained that was concerning for pulmonary HTN and RV failure. He He endorsed several months of progressive dyspnea on exertion with increasing LLE edema over the past two weeks. He presented to the ED. Ed workup was notable for SPO2 of 83%, NA 126, BNP 189. CT chest demonstrated a large left sided pleural effusion.    On 9/2 Pulmonology was consulted and recommended CT surgery consult. IR completed a thoracentesis with 953m of thick consistency pleural fluid removed with a thick calcified rind encasing fluid. [facility] having a new levophed requirement and for a CTTS consult.    Pertinent  Medical History  squamous cell lung cancer (s/p pneumonectomy (2005) and chemo with adjuvant cisplatin/vinorelbine), HLD, HTN  Significant Hospital Events: Including procedures, antibiotic start and stop dates in addition to other pertinent events   [facility], [facility]   Interim History / Subjective:  9/5 requiring up to 10  mcg/min of levophed, since 9/5 at 6 pm has remained off after starting midodrine.   Objective   Blood pressure (!) 107/55, pulse 74, temperature 97.9 F (36.6 C), temperature source Axillary, resp. rate (!) 24, height 5' 9.02" (1.753 m), weight 56 kg, SpO2 100 %. CVP:  [1 mmHg-9 mmHg] 7 mmHg  FiO2 (%):  [30 %-40 %] 40 %   Intake/Output Summary (Last 24 hours) at 08/23/2021 0815 Last data filed at 08/23/2021 0700 Gross per 24 hour  Intake 847.51 ml  Output 375 ml  Net 472.51 ml   Filed Weights   08/21/21 0442 08/22/21 0415 08/23/21 0500  Weight: 55.8 kg 52.9 kg 56 kg    Examination: Adult male, lying in bed, no distress, BiPAP in place  Absent Breath Sounds to left, coarse on right, no use of accessory muscles  RRR, HR 79, no mRG  Alert, follows commands  ABD: Soft, non-tender, active bowel sounds Skin: intact, no edema   Resolved Hospital Problem list     Assessment & Plan:   Acute on chronic hypercapneic respiratory failure- issue again -ABG 9/5 > 7.365/78.1/85 Severe emphysema Prior pneumonectomy- some mediasitnal shift, will see if this is affecting his CO2 retention; [facility], [facility]- on review of his prior weights he's actually around same weight as 18 months ago at his oncology office visit; he had prior workup for this back in 2020 with CT A/P neg for structural issues.  No e/o swallowing trouble on SLP eval 9/4. Severe PAH- this is probably just progressive group 3 looking  at how bad his emphysema has gotten; may ask CHF team for Octavia at some point to see if any e/o superimposed group 1; this can probably wait until OP Shock- resolved with low dose midodrine Plan - Nonurgent esophagram pending - Cortisol ACTH stimulation with Corisitol base 29.6, 30 min 39.7, 60 min pending - Continue midodrine to 24m TID qAC - Continue BiPAP as needed (currently on 40% FiO2), Titrate supplemental Oxygen for  saturation goal >92   Hx HTN, HLD Plan -Continue to home home Coreg due to ongoing need for midodrine as above  -Continue home Zocor  -Continue ASA Daily   Best Practice (right click and "Reselect all SmartList Selections" daily)   Diet/type: reg DVT prophylaxis: LMWH GI prophylaxis: not indicated, however PPI on for H/O GERD Lines: CVL, keep for now Foley:  N/A Code Status:  full code Last date of multidisciplinary goals of care discussion- Family meeting at 10 am with Dr. STamala [name]

## 2021-08-23 NOTE — Consult Note (Signed)
Advanced Heart Failure Team Consult Note   Primary Physician: Patient, No Pcp Per (Inactive) PCP-Cardiologist:  None  Reason for Consultation: Pulmonary hypertension   HPI:    [name] [name] is seen today for evaluation of pulmonary hypertension at the request of Dr. Ina Homes.  The patient is a 70 y/o male with a medical history significant for squamous cell carcinoma s/p L pneumonectomy in 2005 with prior chemotherapy (cisplatin/vinorelbine), COPD, hyperlipidemia, hypertension and paroxysmal atrial fibrillation who presented to [facility] for evaluation of worsening dyspnea and LE edema over the past several months.  He also endorses abdominal bloating and pressure that worsens his dyspnea as well as dyspnea on activity.    On arrival to the ED, he was hypoxic with oxygen saturations in the 80s.  A chest CT was notable for a large left-sided pleural effusion and he underwent a thoracentesis with 900 mL of thick pleural fluid removed at [facility].  He was hypotensive with SBPs in the 80s, so he was started on Levophed and transferred to [facility].  He underwent a second thoracentesis on 9/5 with the removal of 500 mL of dark, bloody-appearing fluid. Fluid cultures are negative.   He says he does not follow with a cardiologist, but scheduled his first appointment with one this week.  His PCP did recommend an echocardiogram due to his complaints of dyspnea and LLE.  The echo was concerning for right-sided heart failure and pulmonary hypertension.    An echo repeated on 08/20/2021 found an EF of 70 to 75% with hyperdynamic LV function and no WMAs.  RV systolic function is normal, but there is severely elevated pulmonary artery pressure with an RVSP of 63.1 mmHg and an RA pressure of 8 mmHg.  There is also moderate to severe TR.   He was weaned off BiPAP and is now resting comfortably on supplemental oxygen.  On exam, he is sitting up in bed eating lunch.  He complains of mild abdominal pressure, but denies chest  pain, shortness of breath, edema, dizziness and lightheadedness.    Review of Systems: [y] = yes, _0  = no  Negative unless reported in HPI.  General: Weight gain _1 ; Weight loss _2 ; Anorexia _3 ; Fatigue [ y]; Fever _4 ; Chills _5 ; Weakness _6   Cardiac: Chest pain/pressure _7 ; Resting SOB [ y]; Exertional SOB [ y]; Orthopnea _8 ; Pedal Edema _9 ; Palpitations _10 ; Syncope _11 ; Presyncope _12 ; Paroxysmal nocturnal dyspnea_13   Pulmonary: Cough [ y]; Wheezing_14 ; Hemoptysis_15 ; Sputum _16 ; Snoring _17   GI: Vomiting_18 ; Dysphagia_19 ; Melena_20 ; Hematochezia _21 ; Heartburn_22 ; Abdominal pain Blue.Reese ]; Constipation _23 ; Diarrhea _24 ; BRBPR _25   GU: Hematuria_26 ; Dysuria _27 ; Nocturia_28   Vascular: Pain in legs with walking _29 ; Pain in feet with lying flat _30 ; Non-healing sores _31 ; Stroke _32 ; TIA _33 ; Slurred speech _34 ;  Neuro: Headaches_35 ; Vertigo_36 ; Seizures_37 ; Paresthesias_38 ;Blurred vision _39 ; Diplopia _40 ; Vision changes _41   Ortho/Skin: Arthritis Blue.Reese ]; Joint pain [ y]; Muscle pain _42 ; Joint swelling _43 ; Back Pain _44 ; Rash _45   Psych: Depression_46 ; Anxiety_47   Heme: Bleeding problems _48 ; Clotting disorders _49 ; Anemia _50   Endocrine: Diabetes _51 ; Thyroid dysfunction_52   Home Medications Prior to Admission medications   Medication Sig Start Date End Date Taking? Authorizing Provider  acetaminophen (TYLENOL) 325 MG tablet Take 650 mg by mouth every 6 (six) hours as needed for headache or mild pain.   Yes [provider]  albuterol (VENTOLIN HFA) 108 (90 Base) MCG/ACT inhaler Inhale 1-2 puffs into the lungs every 6 (six) hours as needed. 04/04/21  Yes [provider]  ascorbic acid (VITAMIN C) 1000 MG tablet Take 1,000 mg by mouth daily.   Yes [provider]  aspirin 81 MG EC tablet Take 81 mg by mouth daily. 12/30/19  Yes [provider]  carvedilol (COREG) 3.125 MG tablet Take 3.125 mg by mouth 2 (two) times daily. 07/11/21  Yes [provider]  Cholecalciferol 10 MCG (400 UNIT) CAPS Take 400 Units by mouth daily.   Yes [provider]  furosemide (LASIX) 40 MG tablet Take 40 mg by mouth daily. 08/18/21  Yes [provider]  lisinopril-hydrochlorothiazide (ZESTORETIC) 20-12.5 MG tablet Take 1 tablet by mouth daily. 04/26/11  Yes [provider]  Multiple Vitamin (MULTIVITAMIN) capsule Take 1 capsule by mouth daily.   Yes [provider]  Multiple Vitamins-Minerals (THERA-M) TABS Take 1 tablet by mouth daily.   Yes [provider]  pantoprazole (PROTONIX) 40 MG tablet Take 40 mg by mouth daily.   Yes [provider]  piperacillin-tazobactam (ZOSYN) 3.375 GM/50ML IVPB Inject 50 mLs (3.375 g total) into the vein every 8 (eight) hours. 08/19/21  Yes Val Riles, MD  simvastatin (ZOCOR) 20 MG tablet Take 20 mg by mouth daily.   Yes [provider]  simvastatin (ZOCOR) 20 MG tablet Take 10 mg by mouth at bedtime. Patient not taking: No sig reported 04/26/11   [provider]    Past Medical History: Past Medical History:  Diagnosis Date   Cancer ([place])    lung   CHF (congestive heart failure) ([place])    Diabetes mellitus without complication ([place])    Dyspnea    Hyperlipidemia    Hypertension     Past Surgical History: Past Surgical History:  Procedure Laterality Date   COLONOSCOPY WITH PROPOFOL N/A 04/20/2021   Procedure: COLONOSCOPY WITH PROPOFOL;  Surgeon: Toledo, Benay Pike, MD;  Location: [facility] ENDOSCOPY;  Service: Gastroenterology;  Laterality: N/A;   ESOPHAGOGASTRODUODENOSCOPY N/A 04/20/2021   Procedure: ESOPHAGOGASTRODUODENOSCOPY (EGD);  Surgeon: Toledo, Benay Pike, MD;  Location: [facility] ENDOSCOPY;  Service: Gastroenterology;  Laterality: N/A;   LUNG REMOVAL, PARTIAL      Family History: No family history of PAH   Social History: Social History   Socioeconomic History   Marital status: Single    Spouse name: Not on file   Number of children:  Not on file   Years of education: Not on file   Highest education level: Not on file  Occupational History   Not on file  Tobacco Use   Smoking status: Former   Smokeless tobacco: Never  Vaping Use   Vaping Use: Never used  Substance and Sexual Activity   Alcohol use: Yes    Comment: rare   Drug use: Not Currently   Sexual activity: Not Currently  Other Topics Concern   Not on file  Social History Narrative   Not on file   Social Determinants of Health   Financial Resource Strain: Not on file  Food Insecurity: Not on file  Transportation Needs: Not on file  Physical Activity: Not on file  Stress: Not on file  Social Connections: Not on file    Allergies:  No Known Allergies  Objective:  Vital Signs:   Temp:  [97.9 F (36.6 C)-99 F (37.2 C)] 97.9 F (36.6 C) (09/06 0811) Pulse Rate:  [72-94] 77 (09/06 1000) Resp:  [14-40] 18 (09/06 1000) BP: (87-123)/(53-85) 114/85 (09/06 1000) SpO2:  [79 %-100 %] 100 % (09/06 1000) Arterial Line BP: (92-146)/(48-71) 133/71 (09/06 1000) FiO2 (%):  [30 %-40 %] 40 % (09/06 0800) Weight:  [56 kg] 56 kg (09/06 0500) Last BM Date: 08/20/21  Weight change: Filed Weights   08/21/21 0442 08/22/21 0415 08/23/21 0500  Weight: 55.8 kg 52.9 kg 56 kg    Intake/Output:   Intake/Output Summary (Last 24 hours) at 08/23/2021 1125 Last data filed at 08/23/2021 1000 Gross per 24 hour  Intake 871.04 ml  Output 550 ml  Net 321.04 ml      Physical Exam  CVP 4   General:  Elderly male. Extremely thin.  No acute distress  HEENT: normal Neck: supple. JVP to clavicle. Carotids 2+ bilat; no bruits. No lymphadenopathy or thyromegaly appreciated.  LIJ CVC  Cor: PMI nondisplaced. Regular rate & rhythm. No rubs, gallops or murmurs. Lungs: Absent on left (no lung present). Clear on right  Abdomen: soft, nontender, nondistended. No hepatosplenomegaly. No bruits or masses. Good bowel sounds. Extremities: no cyanosis, clubbing, rash, edema. L  radial arterial line  Neuro: alert & orientedx3, cranial nerves grossly intact. moves all 4 extremities w/o difficulty. Affect pleasant   Telemetry   SR in 90s with PVCs.  Personally reviewed   EKG    08/18/2021: SR in 90s, no acute ischemic changes   Labs   Basic Metabolic Panel: Recent Labs  Lab 08/18/21 1941 08/19/21 0147 08/20/21 0059 08/20/21 0415 08/20/21 1003 08/21/21 0622 08/22/21 0500 08/22/21 0808 08/22/21 1009 08/22/21 1415 08/22/21 2122 08/23/21 0600 08/23/21 0920  NA 126*  --    < > 132*   < > 135 134*   < > 132* 133* 135 135 135  K 4.5  --    < > 4.0   < > 4.0 4.9   < > 4.5 4.5 4.4 4.1 4.2  CL 86*  --   --  89*  --  90* 88*  --   --   --   --  91*  --   CO2 32  --   --  37*  --  45* 45*  --   --   --   --  41*  --   GLUCOSE 94  --   --  96  --  97 88  --   --   --   --  83  --   BUN 13  --   --  17  --  8 10  --   --   --   --  12  --   CREATININE 0.82 0.90  --  1.17  --  0.84 0.92  --   --   --   --  0.79  --   CALCIUM 9.3  --   --  8.7*  --  8.4* 8.7*  --   --   --   --  8.8*  --   MG  --   --   --  1.8  --  1.8 1.9  --   --   --   --  1.7  --   PHOS  --   --   --  4.5  --   --   --   --   --   --   --   --   --    < > =  values in this interval not displayed.    Liver Function Tests: Recent Labs  Lab 08/18/21 1941 08/21/21 0622  AST 27 15  ALT 24 14  ALKPHOS 67 41  BILITOT 1.3* 1.0  PROT 7.4 5.3*  ALBUMIN 4.3 2.8*   No results for input(s): LIPASE, AMYLASE in the last 168 hours. No results for input(s): AMMONIA in the last 168 hours.  CBC: Recent Labs  Lab 08/18/21 1941 08/19/21 0147 08/20/21 0413 08/20/21 0415 08/20/21 1003 08/21/21 0622 08/22/21 0500 08/22/21 0808 08/22/21 1009 08/22/21 1415 08/22/21 2122 08/23/21 0600 08/23/21 0920  WBC 5.4   < > 7.7 7.5  --  5.8 5.5  --   --   --   --  4.8  --   NEUTROABS 4.4  --   --   --   --  4.3  --   --   --   --   --   --   --   HGB 15.8   < > 12.3* 11.7*   < > 11.1* 11.0*   < > 11.2*  11.9* 11.2* 10.2* 11.2*  HCT 47.3   < > 39.8 38.0*   < > 35.8* 36.9*   < > 33.0* 35.0* 33.0* 33.2* 33.0*  MCV 86.3   < > 91.9 90.9  --  91.3 94.1  --   --   --   --  93.5  --   PLT 182   < > 154 149*  --  164 141*  --   --   --   --  141*  --    < > = values in this interval not displayed.    Cardiac Enzymes: No results for input(s): CKTOTAL, CKMB, CKMBINDEX, TROPONINI in the last 168 hours.  BNP: BNP (last 3 results) Recent Labs    08/18/21 1941 08/20/21 0414  BNP 189.9* 116.3*    ProBNP (last 3 results) No results for input(s): PROBNP in the last 8760 hours.   CBG: Recent Labs  Lab 08/22/21 1016 08/22/21 1133 08/22/21 1648 08/22/21 2115 08/23/21 0642  GLUCAP 144* 175* 106* 130* 82    Coagulation Studies: No results for input(s): LABPROT, INR in the last 72 hours.   Imaging   No results found.   Medications:     Current Medications:  aspirin  81 mg Oral Daily   chlorhexidine  15 mL Mouth Rinse BID   Chlorhexidine Gluconate Cloth  6 each Topical Q0600   enoxaparin (LOVENOX) injection  40 mg Subcutaneous Q24H   mouth rinse  15 mL Mouth Rinse q12n4p   midodrine  5 mg Oral TID WC   pantoprazole (PROTONIX) IV  40 mg Intravenous QHS   simvastatin  20 mg Oral QHS   sodium chloride flush  10-40 mL Intracatheter Q12H    Infusions:  sodium chloride        Patient Profile   The patient is a 70 y/o male with a medical history significant for squamous cell carcinoma s/p L pneumonectomy in 2005 with prior chemotherapy (cisplatin/vinorelbine), COPD, hyperlipidemia, hypertension and paroxysmal atrial fibrillation who presented to [facility] for evaluation of worsening dyspnea and LE edema over the past several months.  Imaging was notable for a left pleural effusion and he underwent thoracentesis x2.  The HF team is following for evaluation of right-sided heart failure and pulmonary hypertension.   Assessment/Plan   Pulmonary hypertension, group 3 - Secondary to h/o  COPD  - Echo consistent with moderate pulmonary hypertension with  very mild RV strain - Will obtain RHC to quantify pulmonary hypertension and evaluate volume status - Recommend appropriate oxygenation and optimizing volume status - Further recommendations, if any, following RHC  2. Acute on chronic hypoxemic respiratory failure - Secondary to pleural effusion -- s/p thoracentesis x2  - O2 saturations in 80s on arrival - Now improved  - Not volume overloaded on exam   3. Hypotension - Initially required Levophed - Now resolved after initiation of midodrine - Initial concern for septic shock -- received Zosyn x2. Fluid cultures negative   4. Lower extremity edema - Resolved  - Started on Lasix as outpatient - Hold for now given recent hypotension. Currently does not appear volume overloaded   5. COPD - Management per primary team  - Not currently exacerbated  6. H/O hypertension - Home meds include Coreg, lisinopril-HCTZ and Lasix -- held for now due to hypotension   7. Hyponatremia  - Initial Na of 126, now 135 - Now resolved after thoracentesis  Length of Stay: [place], NP  08/23/2021, 11:25 AM  Advanced Heart Failure Team Pager [phone] (M-F; 7a - 5p)  Please contact [facility] for night-coverage after hours (4p -7a ) and weekends on [email or web address]   Patient seen and examined with the above-signed Advanced Practice Provider and/or Housestaff. I personally reviewed laboratory data, imaging studies and relevant notes. I independently examined the patient and formulated the important aspects of the plan. I have edited the note to reflect any of my changes or salient points. I have personally discussed the plan with the patient and/or family.  70 y/o male with h/o COPD, squamous cell lung CA s/p left pneumonectomy in 2005 now admitted with worsening respiratory failure.   CT shows absence of left lung with complete opacification of left hemithorax. He has undergone  thoracentesis x 2  with no change in CXR  Echo with EF 65-70% G1DD. RV mildly dilated with mildly reduced function. No septal flattening. RVSP 41mHG  General:  Thin frail male. Sitting up in bed. NAD HEENT: normal + temporal wasting  Neck: supple. JVP minimally elevated  Carotids 2+ bilat; no bruits. No lymphadenopathy or thryomegaly appreciated. Cor: PMI nondisplaced. Regular rate & rhythm. No rubs, gallops or murmurs. Lungs: clear on R. Decreased on left  Abdomen: soft, nontender, nondistended. No hepatosplenomegaly. No bruits or masses. Good bowel sounds. Extremities: no cyanosis, clubbing, rash, edema, cachetic  Neuro: alert & orientedx3, cranial nerves grossly intact. moves all 4 extremities w/o difficulty. Affect pleasant  No PFTs in chart in care everywhere.  Almost certainly this is WHO Group 3 with moderate PAH on echo. There is minimal RV strain on echo. Limited therapeutic options (O2 is mainstay). Will proceed with RHC to quantify degree of PH and make sure volume status is optimized.   DGlori Bickers MD  1:05 PM

## 2021-08-23 NOTE — Progress Notes (Signed)
BLE venous duplex has been completed.  Results can be found under chart review under CV PROC. 08/23/2021 6:17 PM Robinn Overholt RVT, RDMS

## 2021-08-24 ENCOUNTER — Encounter (HOSPITAL_COMMUNITY)
Admission: AD | Disposition: A | Discharge status: Home / Self Care | Payer: Self-pay | Source: Other Acute Inpatient Hospital | Attending: Internal Medicine

## 2021-08-24 ENCOUNTER — Inpatient Hospital Stay (HOSPITAL_COMMUNITY): Payer: Medicare Other

## 2021-08-24 DIAGNOSIS — J9621 Acute and chronic respiratory failure with hypoxia: Secondary | ICD-10-CM

## 2021-08-24 HISTORY — PX: RIGHT HEART CATH: CATH118263

## 2021-08-24 LAB — POCT I-STAT EG7
Acid-Base Excess: 12 mmol/L — ABNORMAL HIGH (ref 0.0–2.0)
Acid-Base Excess: 13 mmol/L — ABNORMAL HIGH (ref 0.0–2.0)
Bicarbonate: 40.9 mmol/L — ABNORMAL HIGH (ref 20.0–28.0)
Bicarbonate: 42.4 mmol/L — ABNORMAL HIGH (ref 20.0–28.0)
Calcium, Ion: 1.26 mmol/L (ref 1.15–1.40)
Calcium, Ion: 1.29 mmol/L (ref 1.15–1.40)
HCT: 37 % — ABNORMAL LOW (ref 39.0–52.0)
HCT: 38 % — ABNORMAL LOW (ref 39.0–52.0)
Hemoglobin: 12.6 g/dL — ABNORMAL LOW (ref 13.0–17.0)
Hemoglobin: 12.9 g/dL — ABNORMAL LOW (ref 13.0–17.0)
O2 Saturation: 66 %
O2 Saturation: 68 %
Potassium: 4.3 mmol/L (ref 3.5–5.1)
Potassium: 4.5 mmol/L (ref 3.5–5.1)
Sodium: 136 mmol/L (ref 135–145)
Sodium: 137 mmol/L (ref 135–145)
TCO2: 43 mmol/L — ABNORMAL HIGH (ref 22–32)
TCO2: 45 mmol/L — ABNORMAL HIGH (ref 22–32)
pCO2, Ven: 72.9 mmHg (ref 44.0–60.0)
pCO2, Ven: 76.8 mmHg (ref 44.0–60.0)
pH, Ven: 7.351 (ref 7.250–7.430)
pH, Ven: 7.357 (ref 7.250–7.430)
pO2, Ven: 38 mmHg (ref 32.0–45.0)
pO2, Ven: 39 mmHg (ref 32.0–45.0)

## 2021-08-24 LAB — BASIC METABOLIC PANEL
Anion gap: 6 (ref 5–15)
BUN: 12 mg/dL (ref 8–23)
CO2: 39 mmol/L — ABNORMAL HIGH (ref 22–32)
Calcium: 9.1 mg/dL (ref 8.9–10.3)
Chloride: 90 mmol/L — ABNORMAL LOW (ref 98–111)
Creatinine, Ser: 0.84 mg/dL (ref 0.61–1.24)
GFR, Estimated: >60 mL/min (ref >60)
Glucose, Bld: 87 mg/dL (ref 70–99)
Potassium: 4.4 mmol/L (ref 3.5–5.1)
Sodium: 135 mmol/L (ref 135–145)

## 2021-08-24 LAB — CULTURE, BLOOD (ROUTINE X 2)
Culture: NO GROWTH
Culture: NO GROWTH
Special Requests: ADEQUATE

## 2021-08-24 LAB — CBC
HCT: 34.9 % — ABNORMAL LOW (ref 39.0–52.0)
Hemoglobin: 10.8 g/dL — ABNORMAL LOW (ref 13.0–17.0)
MCH: 28.5 pg (ref 26.0–34.0)
MCHC: 30.9 g/dL (ref 30.0–36.0)
MCV: 92.1 fL (ref 80.0–100.0)
Platelets: 160 10*3/uL (ref 150–400)
RBC: 3.79 MIL/uL — ABNORMAL LOW (ref 4.22–5.81)
RDW: 12.7 % (ref 11.5–15.5)
WBC: 4.5 10*3/uL (ref 4.0–10.5)
nRBC: 0 % (ref 0.0–0.2)

## 2021-08-24 LAB — GLUCOSE, CAPILLARY
Glucose-Capillary: 99 mg/dL (ref 70–99)
Glucose-Capillary: 112 mg/dL — ABNORMAL HIGH (ref 70–99)

## 2021-08-24 LAB — MAGNESIUM: Magnesium: 2.1 mg/dL (ref 1.7–2.4)

## 2021-08-24 SURGERY — RIGHT HEART CATH
Anesthesia: LOCAL

## 2021-08-24 MED ORDER — HEPARIN (PORCINE) IN NACL 1000-0.9 UT/500ML-% IV SOLN
INTRAVENOUS | Status: DC | PRN
  Administered 2021-08-24 (×2): 500 mL

## 2021-08-24 MED ORDER — ONDANSETRON HCL 4 MG/2ML IJ SOLN: 4.0000 mg | Freq: Four times a day (QID) | INTRAMUSCULAR | Status: DC | PRN

## 2021-08-24 MED ORDER — ACETAMINOPHEN 325 MG PO TABS
650.0000 mg | ORAL_TABLET | ORAL | Status: DC | PRN
  Administered 2021-08-31 – 2021-09-15 (×10): 650 mg via ORAL
  Filled 2021-08-24 (×11): qty 2

## 2021-08-24 MED ORDER — SODIUM CHLORIDE 0.9% FLUSH
3.0000 mL | Freq: Two times a day (BID) | INTRAVENOUS | Status: DC
  Administered 2021-08-24 – 2021-09-15 (×39): 3 mL via INTRAVENOUS

## 2021-08-24 MED ORDER — LIDOCAINE HCL (PF) 1 % IJ SOLN
INTRAMUSCULAR | Status: DC | PRN
  Administered 2021-08-24: 2 mL via INTRADERMAL

## 2021-08-24 MED ORDER — HYDRALAZINE HCL 20 MG/ML IJ SOLN: 10.0000 mg | INTRAMUSCULAR | Status: AC | PRN

## 2021-08-24 MED ORDER — ENOXAPARIN SODIUM 40 MG/0.4ML IJ SOSY
40.0000 mg | PREFILLED_SYRINGE | INTRAMUSCULAR | Status: DC
  Administered 2021-08-25 – 2021-09-16 (×23): 40 mg via SUBCUTANEOUS
  Filled 2021-08-24 (×24): qty 0.4

## 2021-08-24 MED ORDER — LIDOCAINE HCL (PF) 1 % IJ SOLN
INTRAMUSCULAR | Status: AC
  Filled 2021-08-24: qty 30

## 2021-08-24 MED ORDER — ACETAMINOPHEN 325 MG PO TABS
650.0000 mg | ORAL_TABLET | Freq: Four times a day (QID) | ORAL | Status: DC | PRN
  Administered 2021-09-03: 650 mg via ORAL
  Filled 2021-08-24: qty 2

## 2021-08-24 MED ORDER — SODIUM CHLORIDE 0.9% FLUSH: 3.0000 mL | INTRAVENOUS | Status: DC | PRN

## 2021-08-24 MED ORDER — SODIUM CHLORIDE 0.9 % IV SOLN: 250.0000 mL | INTRAVENOUS | Status: DC | PRN

## 2021-08-24 MED ORDER — LABETALOL HCL 5 MG/ML IV SOLN: 10.0000 mg | INTRAVENOUS | Status: AC | PRN

## 2021-08-24 SURGICAL SUPPLY — 5 items
CATH BALLN WEDGE 5F 110CM (CATHETERS) ×1 IMPLANT
GUIDEWIRE .025 260CM (WIRE) ×1 IMPLANT
PACK CARDIAC CATHETERIZATION (CUSTOM PROCEDURE TRAY) ×2 IMPLANT
SHEATH GLIDE SLENDER 4/5FR (SHEATH) ×1 IMPLANT
TRANSDUCER W/STOPCOCK (MISCELLANEOUS) ×2 IMPLANT

## 2021-08-24 NOTE — Interval H&P Note (Signed)
History and Physical Interval Note:  08/24/2021 10:16 AM  [name] [name]  has presented today for surgery, with the diagnosis of pulmonary HTN.  The various methods of treatment have been discussed with the patient and family. After consideration of risks, benefits and other options for treatment, the patient has consented to  Procedure(s): RIGHT HEART CATH (N/A) as a surgical intervention.  The patient's history has been reviewed, patient examined, no change in status, stable for surgery.  I have reviewed the patient's chart and labs.  Questions were answered to the patient's satisfaction.     [name]

## 2021-08-24 NOTE — Progress Notes (Signed)
RT placed patient on BIPAP at this time. Patient is tolerating settings well. RT will monitor as needed.

## 2021-08-24 NOTE — Progress Notes (Addendum)
Advanced Heart Failure Rounding Note  PCP-Cardiologist: None   Patient Profile    The patient is a 70 y/o male with a medical history significant for squamous cell carcinoma s/p L pneumonectomy in 2005 with prior chemotherapy (cisplatin/vinorelbine), COPD, hyperlipidemia, hypertension and paroxysmal atrial fibrillation who presented to [facility] for evaluation of worsening dyspnea and LE edema over the past several months.  Imaging was notable for a left pleural effusion and he underwent thoracentesis x2.  The HF team is following for evaluation of right-sided heart failure and pulmonary hypertension.  Echo with EF 65-70% G1DD. RV mildly dilated with mildly reduced function. No septal flattening. RVSP 52mHG  Subjective:    Wt stable off diuretics. BP stable w/ midodrine. SBPs 130s. On for RHC today.   Bilateral LE Venous dopplers negative for DVT   Breathing stable. On 1L/min [place]. Denies dyspnea. Feels "ok". No complaints    Objective:   Weight Range: 55.5 kg Body mass index is 18.06 kg/m.   Vital Signs:   Temp:  [97.9 F (36.6 C)-99.2 F (37.3 C)] 98.2 F (36.8 C) (09/07 0700) Pulse Rate:  [68-97] 78 (09/07 0800) Resp:  [16-35] 22 (09/07 0800) BP: (93-158)/(58-94) 124/69 (09/07 0800) SpO2:  [87 %-100 %] 100 % (09/07 0800) Arterial Line BP: (100-180)/(49-88) 135/68 (09/07 0800) Weight:  [55.5 kg] 55.5 kg (09/07 0500) Last BM Date: 08/20/21  Weight change: Filed Weights   08/23/21 0500 08/23/21 1921 08/24/21 0500  Weight: 56 kg 55.5 kg 55.5 kg    Intake/Output:   Intake/Output Summary (Last 24 hours) at 08/24/2021 0918 Last data filed at 08/24/2021 0800 Gross per 24 hour  Intake 483.33 ml  Output 750 ml  Net -266.67 ml      Physical Exam    CVP 6-7  General:  Well appearing. No resp difficulty HEENT: Normal Neck: Supple. JVP 7 cm . Carotids 2+ bilat; no bruits. No lymphadenopathy or thyromegaly appreciated. Cor: PMI nondisplaced. Regular rate & rhythm. No rubs,  gallops or murmurs. Lungs: Clear Abdomen: Soft, nontender, nondistended. No hepatosplenomegaly. No bruits or masses. Good bowel sounds. Extremities: No cyanosis, clubbing, rash, edema Neuro: Alert & orientedx3, cranial nerves grossly intact. moves all 4 extremities w/o difficulty. Affect pleasant   Telemetry   NSR 90s.   EKG    No new EKG to review   Labs    CBC Recent Labs    08/23/21 0600 08/23/21 0920 08/24/21 0409  WBC 4.8  --  4.5  HGB 10.2* 11.2* 10.8*  HCT 33.2* 33.0* 34.9*  MCV 93.5  --  92.1  PLT 141*  --  1559  Basic Metabolic Panel Recent Labs    08/23/21 0600 08/23/21 0920 08/24/21 0409  NA 135 135 135  K 4.1 4.2 4.4  CL 91*  --  90*  CO2 41*  --  39*  GLUCOSE 83  --  87  BUN 12  --  12  CREATININE 0.79  --  0.84  CALCIUM 8.8*  --  9.1  MG 1.7  --  2.1   Liver Function Tests No results for input(s): AST, ALT, ALKPHOS, BILITOT, PROT, ALBUMIN in the last 72 hours. No results for input(s): LIPASE, AMYLASE in the last 72 hours. Cardiac Enzymes No results for input(s): CKTOTAL, CKMB, CKMBINDEX, TROPONINI in the last 72 hours.  BNP: BNP (last 3 results) Recent Labs    08/18/21 1941 08/20/21 0414  BNP 189.9* 116.3*    ProBNP (last 3 results) No results for input(s):  PROBNP in the last 8760 hours.   D-Dimer No results for input(s): DDIMER in the last 72 hours. Hemoglobin A1C Recent Labs    08/23/21 0938  HGBA1C 5.5   Fasting Lipid Panel No results for input(s): CHOL, HDL, LDLCALC, TRIG, CHOLHDL, LDLDIRECT in the last 72 hours. Thyroid Function Tests No results for input(s): TSH, T4TOTAL, T3FREE, THYROIDAB in the last 72 hours.  Invalid input(s): FREET3  Other results:   Imaging    VAS [place] LOWER EXTREMITY VENOUS (DVT)  Result Date: 08/23/2021  Lower Venous DVT Study Patient Name:  [name] [name]  Date of Exam:   08/23/2021 Medical Rec #: [id number]     Accession #:    [id number] Date of Birth: 20-Oct-1951    Patient Gender: M Patient  Age:   74 years Exam Location:  [facility] Procedure:      VAS [place] LOWER EXTREMITY VENOUS (DVT) Referring Phys: Ina Homes --------------------------------------------------------------------------------  Indications: Edema. Other Indications: Worsening dyspnea and LE edema over past several months. Comparison Study: No previous exams Performing Technologist: Jody Hill RVT, RDMS  Examination Guidelines: A complete evaluation includes B-mode imaging, spectral Doppler, color Doppler, and power Doppler as needed of all accessible portions of each vessel. Bilateral testing is considered an integral part of a complete examination. Limited examinations for reoccurring indications may be performed as noted. The reflux portion of the exam is performed with the patient in reverse Trendelenburg.  +---------+---------------+---------+-----------+----------+--------------+ RIGHT    CompressibilityPhasicitySpontaneityPropertiesThrombus Aging +---------+---------------+---------+-----------+----------+--------------+ CFV      Full           Yes      Yes                                 +---------+---------------+---------+-----------+----------+--------------+ SFJ      Full                                                        +---------+---------------+---------+-----------+----------+--------------+ FV Prox  Full           Yes      Yes                                 +---------+---------------+---------+-----------+----------+--------------+ FV Mid   Full           Yes      Yes                                 +---------+---------------+---------+-----------+----------+--------------+ FV DistalFull           Yes      Yes                                 +---------+---------------+---------+-----------+----------+--------------+ PFV      Full                                                        +---------+---------------+---------+-----------+----------+--------------+  POP      Full           Yes      Yes                                 +---------+---------------+---------+-----------+----------+--------------+ PTV      Full                                                        +---------+---------------+---------+-----------+----------+--------------+ PERO     Full                                                        +---------+---------------+---------+-----------+----------+--------------+   +---------+---------------+---------+-----------+----------+--------------+ LEFT     CompressibilityPhasicitySpontaneityPropertiesThrombus Aging +---------+---------------+---------+-----------+----------+--------------+ CFV      Full           Yes      Yes                                 +---------+---------------+---------+-----------+----------+--------------+ SFJ      Full                                                        +---------+---------------+---------+-----------+----------+--------------+ FV Prox  Full           Yes      Yes                                 +---------+---------------+---------+-----------+----------+--------------+ FV Mid   Full           Yes      Yes                                 +---------+---------------+---------+-----------+----------+--------------+ FV DistalFull           Yes      Yes                                 +---------+---------------+---------+-----------+----------+--------------+ PFV      Full                                                        +---------+---------------+---------+-----------+----------+--------------+ POP      Full           Yes      Yes                                 +---------+---------------+---------+-----------+----------+--------------+ PTV  Full                                                        +---------+---------------+---------+-----------+----------+--------------+ PERO     Full                                                         +---------+---------------+---------+-----------+----------+--------------+     Summary: BILATERAL: - No evidence of deep vein thrombosis seen in the lower extremities, bilaterally. - No evidence of superficial venous thrombosis in the lower extremities, bilaterally. -No evidence of popliteal cyst, bilaterally.   *See table(s) above for measurements and observations. Electronically signed by Harold Barban MD on 08/23/2021 at 7:21:54 PM.    Final      Medications:     Scheduled Medications:  aspirin  81 mg Oral Daily   Chlorhexidine Gluconate Cloth  6 each Topical Q0600   enoxaparin (LOVENOX) injection  40 mg Subcutaneous Q24H   mouth rinse  15 mL Mouth Rinse BID   midodrine  10 mg Oral TID WC   pantoprazole (PROTONIX) IV  40 mg Intravenous QHS   simvastatin  20 mg Oral QHS   sodium chloride flush  10-40 mL Intracatheter Q12H   sodium chloride flush  3 mL Intravenous Q12H    Infusions:  sodium chloride 10 mL/hr at 08/24/21 0500   sodium chloride 10 mL/hr at 08/24/21 0800    PRN Medications: sodium chloride, acetaminophen, docusate sodium, metoprolol tartrate, polyethylene glycol, sodium chloride flush, sodium chloride flush    Assessment/Plan   Pulmonary hypertension, group 3 - Secondary to h/o COPD  - Echo consistent with moderate pulmonary hypertension with very mild RV strain - Will obtain RHC today to quantify pulmonary hypertension and evaluate volume status - Recommend appropriate oxygenation and optimizing volume status - Further recommendations, if any, following RHC - Recommend PFTs    2. Acute on chronic hypoxemic respiratory failure - Secondary to pleural effusion -- s/p thoracentesis x2  - O2 saturations in 80s on arrival - Now improved  - Not volume overloaded on exam    3. Hypotension - Initially required Levophed - Now resolved after initiation of midodrine - Initial concern for septic shock -- received Zosyn x2. Fluid cultures  negative    4. Lower extremity edema - Resolved  - Started on Lasix as outpatient - Hold for now given recent hypotension. Currently does not appear volume overloaded    5. COPD - Management per primary team  - Not currently exacerbated   6. H/O hypertension - Home meds include Coreg, lisinopril-HCTZ and Lasix -- held for now due to hypotension    7. Hyponatremia  - Initial Na of 126, now 135 - Now resolved after thoracentesis    Length of Stay: [address] PA-C  08/24/2021, 9:18 AM  Advanced Heart Failure Team Pager [phone] (M-F; 7a - 5p)  Please contact [facility] for night-coverage after hours (5p -7a ) and weekends on [email or web address]  Patient seen and examined with the above-signed Advanced Practice Provider and/or Housestaff. I personally reviewed laboratory data, imaging studies and relevant notes. I independently examined the patient and formulated the important  aspects of the plan. I have edited the note to reflect any of my changes or salient points. I have personally discussed the plan with the patient and/or family.  Stable overnight. Oxygenation improved. BP better on midodrine.   General:  Thin male No resp difficulty HEENT: normal Neck: supple. no JVD. Carotids 2+ bilat; no bruits. No lymphadenopathy or thryomegaly appreciated. Cor: PMI nondisplaced. Regular rate & rhythm. No rubs, gallops or murmurs. Lungs: clear decreased on left  Abdomen: soft, nontender, nondistended. No hepatosplenomegaly. No bruits or masses. Good bowel sounds. Extremities: no cyanosis, clubbing, rash, edema Neuro: alert & orientedx3, cranial nerves grossly intact. moves all 4 extremities w/o difficulty. Affect pleasant   Oxygenation and BP improved. For RHC today to assess degree of PAH and left-sided filling pressures. Likely not candidate for selective pulmonary artery vasodilators.   Glori Bickers, MD  10:18 AM

## 2021-08-24 NOTE — Progress Notes (Addendum)
NAMEDayna Geurts, MRN:  [id number], DOB:  1951-01-13, LOS: 4 ADMISSION DATE:  08/20/2021, CONSULTATION DATE:   9/2 REFERRING MD:  [name], REASON FOR CONSULT:  Left sided pleural effusion/empyema  History of Present Illness:  Colum Sevigny is  70 y.o. M  who is seen in consultation at the request of Dr. [name] for recommendations on further evaluation and management of left sided pleural effusion/empyema. The patient presented to [facility] ED  on 9/1 with complaints of shortness of breath, lower extremity edema, and abdominal bloating. The patient was admitted to Hospitalist service.   He has a pertinent past medical history of squamous cell lung cancer (s/p pneumonectomy (2005) and chemo with adjuvant cisplatin/vinorelbine), HLD, HTN   On 9/1 he was seen by cardiology and sent to ED for diuresis after an ECHO was obtained that was concerning for pulmonary HTN and RV failure. He He endorsed several months of progressive dyspnea on exertion with increasing LLE edema over the past two weeks. He presented to the ED. Ed workup was notable for SPO2 of 83%, NA 126, BNP 189. CT chest demonstrated a large left sided pleural effusion.    On 9/2 Pulmonology was consulted and recommended CT surgery consult. IR completed a thoracentesis with 940m of thick consistency pleural fluid removed with a thick calcified rind encasing fluid. [facility] new [facility] requirement and for a CTTS consult.    Pertinent  Medical History  squamous cell lung cancer (s/p pneumonectomy (2005) and chemo with adjuvant cisplatin/vinorelbine), HLD, HTN  Significant Hospital Events: Including procedures, antibiotic start and stop dates in addition to other pertinent events   [facility] 900[facility], new [facility]ophed requirement, [facility]   Interim History / Subjective:  Has remained off  vasopressors. Off BiPAP throughout the night. On 1L [place] this AM.   Objective   Blood pressure 124/69, pulse 78, temperature 98.2 F (36.8 C), temperature source Oral, resp. rate (!) 22, height 5' 9.02" (1.753 m), weight 55.5 kg, SpO2 100 %. CVP:  [0 mmHg-9 mmHg] 5 mmHg      Intake/Output Summary (Last 24 hours) at 08/24/2021 0853 Last data filed at 08/24/2021 0800 Gross per 24 hour  Intake 493.33 ml  Output 750 ml  Net -256.67 ml   Filed Weights   08/23/21 0500 08/23/21 1921 08/24/21 0500  Weight: 56 kg 55.5 kg 55.5 kg    Examination: Thin Adult male, lying in bed, no distress Absent Breath Sounds to left, coarse on right, no use of accessory muscles  RRR, HR 69, no mRG  Alert, follows commands  ABD: Soft, non-tender, active bowel sounds Skin: intact, no edema   Resolved Hospital Problem list     Assessment & Plan:   Acute on chronic hypercapneic respiratory failure- issue again -ABG 9/5 > 7.365/78.1/85 Severe emphysema Prior pneumonectomy- some mediasitnal shift, will see if this is affecting his CO2 retention; [facility], [facility]- on review of his prior weights he's actually around same weight as 18 months ago at his oncology office visit; he had prior workup for this back in 2020 with CT A/P neg for structural issues.  No e/o swallowing trouble on SLP eval 9/4. Shock- resolved with low dose midodrine - Cortisol ACTH stimulation with Corisitol base 29.6, 30 min 39.7 Plan - Esophagram pending planned for today >  ST planned to follow after  - Continue midodrine to 39m TID qAC - Continue BiPAP as needed (currently on 40% FiO2), Titrate supplemental Oxygen for saturation goal >92   Severe PAH- this is probably just progressive group 3 looking at how bad his emphysema has gotten Plan -Heart Failure Consulted > Plans for RHC today.    Hx HTN, HLD Plan -Continue to home home Coreg due to ongoing need for  midodrine as above  -Continue home Zocor  -Continue ASA Daily   Best Practice (right click and "Reselect all SmartList Selections" daily)   Diet/type: reg DVT prophylaxis: LMWH GI prophylaxis: not indicated, however PPI on for H/O GERD Lines: CVL > discontinue today along with aline  Foley:  N/A Code Status:  full code Last date of multidisciplinary goals of care discussion- ongoing conversations   Post-Procedures is patient is stable will transfer to [facility].   KHayden Pedro AGACNP-BC  Pulmonary & Critical Care  PCCM Pgr: [phone]

## 2021-08-24 NOTE — Progress Notes (Addendum)
SLP Cancellation Note  Patient Details Name: [name] MRN: [id number] DOB: 21-Jun-1951   Cancelled treatment:       Reason Eval/Treat Not Completed: Patient at procedure or test/unavailable. Pt is having esophageal study and heart cath today. SLP will follow up with patient once results of Barium Swallow are available.  [name] B. Quentin Ore, [facility], [place] Speech Language Pathologist Office: [phone]  Shonna Chock 08/24/2021, 8:42 AM

## 2021-08-25 ENCOUNTER — Encounter (HOSPITAL_COMMUNITY): Payer: Self-pay | Admitting: Internal Medicine

## 2021-08-25 ENCOUNTER — Inpatient Hospital Stay (HOSPITAL_COMMUNITY): Payer: Medicare Other

## 2021-08-25 LAB — CBC
HCT: 34.3 % — ABNORMAL LOW (ref 39.0–52.0)
Hemoglobin: 10.7 g/dL — ABNORMAL LOW (ref 13.0–17.0)
MCH: 28.5 pg (ref 26.0–34.0)
MCHC: 31.2 g/dL (ref 30.0–36.0)
MCV: 91.2 fL (ref 80.0–100.0)
Platelets: 174 10*3/uL (ref 150–400)
RBC: 3.76 MIL/uL — ABNORMAL LOW (ref 4.22–5.81)
RDW: 12.6 % (ref 11.5–15.5)
WBC: 4.6 10*3/uL (ref 4.0–10.5)
nRBC: 0 % (ref 0.0–0.2)

## 2021-08-25 LAB — MAGNESIUM: Magnesium: 1.9 mg/dL (ref 1.7–2.4)

## 2021-08-25 LAB — CHOLESTEROL, BODY FLUID: Cholesterol, Fluid: 976 mg/dL

## 2021-08-25 LAB — BASIC METABOLIC PANEL
Anion gap: 5 (ref 5–15)
BUN: 15 mg/dL (ref 8–23)
CO2: 37 mmol/L — ABNORMAL HIGH (ref 22–32)
Calcium: 8.7 mg/dL — ABNORMAL LOW (ref 8.9–10.3)
Chloride: 92 mmol/L — ABNORMAL LOW (ref 98–111)
Creatinine, Ser: 1.17 mg/dL (ref 0.61–1.24)
GFR, Estimated: >60 mL/min (ref >60)
Glucose, Bld: 87 mg/dL (ref 70–99)
Potassium: 4.1 mmol/L (ref 3.5–5.1)
Sodium: 134 mmol/L — ABNORMAL LOW (ref 135–145)

## 2021-08-25 LAB — PHOSPHORUS: Phosphorus: 3.7 mg/dL (ref 2.5–4.6)

## 2021-08-25 LAB — GLUCOSE, CAPILLARY
Glucose-Capillary: 88 mg/dL (ref 70–99)
Glucose-Capillary: 68 mg/dL — ABNORMAL LOW (ref 70–99)

## 2021-08-25 MED ORDER — LEVALBUTEROL HCL 0.63 MG/3ML IN NEBU: 0.6300 mg | INHALATION_SOLUTION | Freq: Four times a day (QID) | RESPIRATORY_TRACT | Status: DC | PRN

## 2021-08-25 MED ORDER — IPRATROPIUM BROMIDE 0.02 % IN SOLN: 0.5000 mg | Freq: Four times a day (QID) | RESPIRATORY_TRACT | Status: DC | PRN

## 2021-08-25 NOTE — Progress Notes (Signed)
NAMEDarian [name], MRN:  [id number], DOB:  05/24/1951, LOS: 5 ADMISSION DATE:  08/20/2021, CONSULTATION DATE:   9/2 REFERRING MD:  [name], REASON FOR CONSULT:  Left sided pleural effusion/empyema  History of Present Illness:  [name] [name] is  70 y.o. M  who is seen in consultation at the request of Dr. [name] for recommendations on further evaluation and management of left sided pleural effusion/empyema. The patient presented to [facility] ED  on 9/1 with complaints of shortness of breath, lower extremity edema, and abdominal bloating. The patient was admitted to Hospitalist service.   He has a pertinent past medical history of squamous cell lung cancer (s/p pneumonectomy (2005) and chemo with adjuvant cisplatin/vinorelbine), HLD, HTN   On 9/1 he was seen by cardiology and sent to ED for diuresis after an ECHO was obtained that was concerning for pulmonary HTN and RV failure. He He endorsed several months of progressive dyspnea on exertion with increasing LLE edema over the past two weeks. He presented to the ED. Ed workup was notable for SPO2 of 83%, NA 126, BNP 189. CT chest demonstrated a large left sided pleural effusion.    On 9/2 Pulmonology was consulted and recommended CT surgery consult. IR completed a thoracentesis with 938m of thick consistency pleural fluid removed with a thick calcified rind encasing fluid. [facility] the patient having a new levophed requirement and for a CTTS consult.    Pertinent  Medical History  squamous cell lung cancer (s/p pneumonectomy (2005) and chemo with adjuvant cisplatin/vinorelbine), HLD, HTN  Significant Hospital Events: Including procedures, antibiotic start and stop dates in addition to other pertinent events   [facility], new [facility]hed requirement[facility] [place]   Interim History / Subjective:  RHC yesterday with mild  to mod PAH, normal left sided pressures.   Tolerated BIPAP overnight.  Objective   Blood pressure 130/80, pulse 84, temperature 97.7 F (36.5 C), temperature source Oral, resp. rate 18, height _0  (1.753 m), weight 55.1 kg, SpO2 100 %.        Intake/Output Summary (Last 24 hours) at 08/25/2021 1039 Last data filed at 08/25/2021 0400 Gross per 24 hour  Intake --  Output 900 ml  Net -900 ml    Filed Weights   08/23/21 1921 08/24/21 0500 08/25/21 0649  Weight: 55.5 kg 55.5 kg 55.1 kg    Examination: Thin Adult male, lying in bed watching TV, no distress Diminished Breath Sounds to left, coarse on right, normal effort RRR, no mRG  Alert, follows commands  Abd soft, non-tender, active bowel sounds Skin intact, no edema   Resolved Hospital Problem list     Assessment & Plan:   Acute on chronic hypercapneic respiratory failure. Severe emphysema. Hx squamous cell CA s/p left pneumonectomy 2005. - Continue BiPAP as needed and QHS. - Might need morning ABG (after holding BiPAP overnight) to prove his nocturnal hypercapnia and need for NIV at home (though has chronically elevated bicarb on chemistries which may suffice). - Continue supportive care.  Mild to mod PAH - presumed progressive group 3 looking at how bad his emphysema has gotten. - Supportive care, not a candidate for selective pulm artery vasodilators.  Shock - resolved with low dose midodrine. Hx HTN, HLD. - Continue midodrine to 511mTID qAC. - Continue to hold home Coreg due to ongoing need for midodrine as above  -  Continue home Zocor, ASA.  Unintentional weight loss, dysphagia - on review of his prior weights he's actually around same weight as 18 months ago at his oncology office visit; he had prior workup for this back in 2020 with CT A/P neg for structural issues.  No e/o swallowing trouble on SLP eval 9/4.  Esophagram 9/8 showed no specific stricture but barium pill did not pass into stomach raising question  of GE junction narrowing. - SLP following. - Consider EGD for further evaluation / ? Dilation.   Rest per primary team.    Montey Hora, [place] For pager details, please see AMION or use Epic chat  After 1900, please call [facility] for cross coverage needs 08/25/2021, 10:47 AM

## 2021-08-25 NOTE — Progress Notes (Signed)
[name] [name]  WCH:[id number] DOB: [birth year] DOA: 08/20/2021 PCP: Patient, No Pcp Per (Inactive)   Brief Narrative:  The patient is a 70 year old AAM who was admitted by the hospitalist service and subsequently transferred to the critical care service for evaluation management of left-sided pleural effusion and empyema.  He initially presented to [facility] ED on 08/18/2021 with complaints of shortness of breath, lower extremity edema and abdominal bloating.  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  He endorsed several months of progressive dyspnea on exertion with increasing left lower extremity edema over the past 2 weeks.  He presented ED and ED work-up was notable for being hypoxic on room air with a sodium of 126 and a BNP of 189.  CT of the chest demonstrated a large left-sided pleural effusion.  Pulmonary was consulted and 08/19/2021 and CT recommended CT surgery consult.  IR completed a thoracentesis with 900 mL of thick consistency pleural fluid removed with a thick calcified rind encasing fluid.  He was transferred to the ICU at [facility] given his shock and also for evaluation by cardiothoracic surgery.  His shock is now resolved and he is transferred back to the [facility] service on 08/25/2021.  Currently is being admitted and treated for acute on chronic hypercapnic respiratory failure in the setting of severe emphysema and history of squamous cell carcinoma with status post left pneumonectomy  Assessment & Plan:   Active Problems:   Pleural effusion   Shock ([place])   Acute respiratory failure with hypoxia and hypercapnia (HCC)   Hyponatremia  Acute on chronic respiratory failure with hypoxia and  hypercapnia in the setting of pleural effusion and COPD -Admitted and then transferred to the ICU and now is out of ICU -Patient has severe emphysema and has a history of squamous cell carcinoma status post left pneumonectomy in 2005 -Pulmonary has been consulted recommending continue BiPAP as needed and nightly -He is status postthoracentesis x2 -ABG    Component Value Date/Time   PHART 7.378 08/23/2021 0920   PCO2ART 76.1 (HH) 08/23/2021 0920   PO2ART 156 (H) 08/23/2021 0920   HCO3 40.9 (H) 08/24/2021 1234   HCO3 42.4 (H) 08/24/2021 1234   TCO2 43 (H) 08/24/2021 1234   TCO2 45 (H) 08/24/2021 1234   O2SAT 68.0 08/24/2021 1234   O2SAT 66.0 08/24/2021 1234    -They are considering obtaining a morning ABG to improve nocturnal hypercapnia and the need for NIV at home -They are recommending continue supportive care -Currently not on any more breathing treatments but will add Xopenex and Atrovent as needed -Continue with pulm toileting  Pulmonary hypertension group 3 -In the setting of COPD -Echocardiogram was consistent with moderate pulmonary hypertension with very mild right ventricular strain -Underwent a right heart cath to quantify pulm hypertension and evaluate his volume status -Cardiology recommending appropriate oxygenation optimizing volume status -Currently not volume overloaded on exam  Hypotension in the setting of shock -Initially required Levophed but now resolved after midodrine we will continue midodrine -There is also initial concern for septic shock and received IV Zosyn x2 but fluid cultures been negative in antibiotics have been discontinued -Continuing low-dose midodrine 5 mg 3 times daily  Hyperlipidemia -Continue statin  Lower extremity edema -Improved and he  was started on Lasix as an outpatient -Cardiology recommending holding Lasix given recent hypotension and that because he does not appear clinically volume overloaded  COPD -Continue supportive care  as above and currently does not appear to be exacerbated  Hypertension but currently was hypotensive so we will hold his medications -He has been on Coreg, lisinopril/HCTZ and Lasix in the outpatient setting but has been held -He will need PFTs but this can be done in outpatient setting  Hyponatremia -Mild.  -Na+ went from 135 -> 137 -> 134 -Continue to Monitor and Trend -Repeat CMP in the AM   Unintentional weight loss and dysphagia -He is about the same weight as 18 months ago at his oncology visit -Had a prior work-up back in 2022 with a CT abdomen pelvis was negative for structural issues -Had SLP on 9 4 and an esophagram on 9 8 which showed no specific stricture but barium pill did not pass of the stomach raising question of GE junction narrowing -SLP following and they are recommending dysphagia diet -GI will evaluate the patient for further evaluation for possible EGD and dilation  DVT prophylaxis: Enoxaparin 40 mg subcu q. 24 Code Status: FULL CODE  Family Communication: No family present at bedside Disposition Plan: Pending further clinical improvement Status is: Inpatient  Remains inpatient appropriate because:Unsafe d/c plan, IV treatments appropriate due to intensity of illness or inability to take PO, and Inpatient level of care appropriate due to severity of illness  Dispo: The patient is from: Home              Anticipated d/c is to:  TBD              Patient currently is not medically stable to d/c.   Difficult to place patient No  Consultants:  Cardiovascular surgery PCCM Cardiology  Procedures:  9/2 Thora with 900cc removed, new levophed requirement, transfer to [facility] 9/6 Thora with 500cc removed  RHC 9/7 with mild to mod PAH, normal left sided pressures.     Antimicrobials:  Anti-infectives (From admission, onward)    Start     Dose/Rate Route Frequency Ordered Stop   08/20/21 0600  piperacillin-tazobactam (ZOSYN) IVPB 3.375 g  Status:  Discontinued         3.375 g 12.5 mL/hr over 240 Minutes Intravenous Every 8 hours 08/20/21 0133 08/21/21 1050        Subjective: Seen and examined at bedside and was doing okay.  No chest pain or shortness of breath.  Feels all right denies any lightheadedness or dizziness.  No other concerns or complaints at this time and states that he had to have fluid drained off his leg twice.  Feels relatively okay.  Underwent esophagram this morning.  Objective: Vitals:   08/25/21 0649 08/25/21 1100 08/25/21 1300 08/25/21 1635  BP: 130/80  124/87   Pulse: 84 86 90   Resp: _0 Temp: 97.7 F (36.5 C)  98.8 F (37.1 C) 98.1 F (36.7 C)  TempSrc: Oral  Axillary Oral  SpO2: 100% 100% 98%   Weight: 55.1 kg     Height: 5' 9" (1.753 m)       Intake/Output Summary (Last 24 hours) at 08/25/2021 1859 Last data filed at 08/25/2021 0400 Gross per 24 hour  Intake --  Output 250 ml  Net -250 ml   Filed Weights   08/23/21 1921 08/24/21 0500 08/25/21 0649  Weight: 55.5 kg 55.5 kg 55.1 kg   Examination: Physical  Exam:  Constitutional: Thin chronically ill-appearing African-American male currently in no acute distress appears calm Eyes: Lids and conjunctivae normal, sclerae anicteric  ENMT: External Ears, Nose appear normal. Grossly normal hearing.  Neck: Appears normal, supple, no cervical masses, normal ROM, no appreciable thyromegaly; no JVD Respiratory: Diminished to auscultation bilaterally, no wheezing, rales, rhonchi or crackles. Normal respiratory effort and patient is not tachypenic. No accessory muscle use.  Unlabored breathing and had his oxygen off and put it back on when I walked on the Cardiovascular: Tachycardic rate, no murmurs / rubs / gallops. S1 and S2 auscultated. No extremity edema.  Abdomen: Soft, non-tender, non-distended.  Bowel sounds positive.  GU: Deferred. Musculoskeletal: No clubbing / cyanosis of digits/nails. No joint deformity upper and lower extremities.  Skin: No rashes,  lesions, ulcers on limited skin evaluation. No induration; Warm and dry.  Neurologic: CN 2-12 grossly intact with no focal deficits. Romberg sign and cerebellar reflexes not assessed.  Psychiatric: Normal judgment and insight. Alert and oriented x 3. Normal mood and appropriate affect.   Data Reviewed: I have personally reviewed following labs and imaging studies  CBC: Recent Labs  Lab 08/18/21 1941 08/19/21 0147 08/21/21 0622 08/22/21 0500 08/22/21 0808 08/23/21 0600 08/23/21 0920 08/24/21 0409 08/24/21 1234 08/25/21 0124  WBC 5.4   < > 5.8 5.5  --  4.8  --  4.5  --  4.6  NEUTROABS 4.4  --  4.3  --   --   --   --   --   --   --   HGB 15.8   < > 11.1* 11.0*   < > 10.2* 11.2* 10.8* 12.9*  12.6* 10.7*  HCT 47.3   < > 35.8* 36.9*   < > 33.2* 33.0* 34.9* 38.0*  37.0* 34.3*  MCV 86.3   < > 91.3 94.1  --  93.5  --  92.1  --  91.2  PLT 182   < > 164 141*  --  141*  --  160  --  174   < > = values in this interval not displayed.   Basic Metabolic Panel: Recent Labs  Lab 08/20/21 0415 08/20/21 1003 08/21/21 0622 08/22/21 0500 08/22/21 0808 08/23/21 0600 08/23/21 0920 08/24/21 0409 08/24/21 1234 08/25/21 0124  NA 132*   < > 135 134*   < > 135 135 135 136  137 134*  K 4.0   < > 4.0 4.9   < > 4.1 4.2 4.4 4.5  4.3 4.1  CL 89*  --  90* 88*  --  91*  --  90*  --  92*  CO2 37*  --  45* 45*  --  41*  --  39*  --  37*  GLUCOSE 96  --  97 88  --  83  --  87  --  87  BUN 17  --  8 10  --  12  --  12  --  15  CREATININE 1.17  --  0.84 0.92  --  0.79  --  0.84  --  1.17  CALCIUM 8.7*  --  8.4* 8.7*  --  8.8*  --  9.1  --  8.7*  MG 1.8  --  1.8 1.9  --  1.7  --  2.1  --  1.9  PHOS 4.5  --   --   --   --   --   --   --   --  3.7   < > =  values in this interval not displayed.   GFR: Estimated Creatinine Clearance: 47.7 mL/min (by C-G formula based on SCr of 1.17 mg/dL). Liver Function Tests: Recent Labs  Lab 08/18/21 1941 08/21/21 0622  AST 27 15  ALT 24 14  ALKPHOS 67 41   BILITOT 1.3* 1.0  PROT 7.4 5.3*  ALBUMIN 4.3 2.8*   No results for input(s): LIPASE, AMYLASE in the last 168 hours. No results for input(s): AMMONIA in the last 168 hours. Coagulation Profile: No results for input(s): INR, PROTIME in the last 168 hours. Cardiac Enzymes: No results for input(s): CKTOTAL, CKMB, CKMBINDEX, TROPONINI in the last 168 hours. BNP (last 3 results) No results for input(s): PROBNP in the last 8760 hours. HbA1C: Recent Labs    08/23/21 0938  HGBA1C 5.5   CBG: Recent Labs  Lab 08/23/21 2136 08/24/21 1633 08/24/21 2026 08/25/21 0823 08/25/21 1632  GLUCAP 76 99 112* 88 68*   Lipid Profile: No results for input(s): CHOL, HDL, LDLCALC, TRIG, CHOLHDL, LDLDIRECT in the last 72 hours. Thyroid Function Tests: No results for input(s): TSH, T4TOTAL, FREET4, T3FREE, THYROIDAB in the last 72 hours. Anemia Panel: No results for input(s): VITAMINB12, FOLATE, FERRITIN, TIBC, IRON, RETICCTPCT in the last 72 hours. Sepsis Labs: Recent Labs  Lab 08/20/21 0413  PROCALCITON <0.10  LATICACIDVEN 0.7    Recent Results (from the past 240 hour(s))  Resp Panel by RT-PCR (Flu A&B, Covid) Nasopharyngeal Swab     Status: None   Collection Time: 08/18/21  7:41 PM   Specimen: Nasopharyngeal Swab; Nasopharyngeal(NP) swabs in vial transport medium  Result Value Ref Range Status   SARS Coronavirus 2 by RT PCR NEGATIVE NEGATIVE Final    Comment: (NOTE) SARS-CoV-2 target nucleic acids are NOT DETECTED.  The SARS-CoV-2 RNA is generally detectable in upper respiratory specimens during the acute phase of infection. The lowest concentration of SARS-CoV-2 viral copies this assay can detect is 138 copies/mL. A negative result does not preclude SARS-Cov-2 infection and should not be used as the sole basis for treatment or other patient management decisions. A negative result may occur with  improper specimen collection/handling, submission of specimen other than nasopharyngeal  swab, presence of viral mutation(s) within the areas targeted by this assay, and inadequate number of viral copies(<138 copies/mL). A negative result must be combined with clinical observations, patient history, and epidemiological information. The expected result is Negative.  Fact Sheet for Patients:  EntrepreneurPulse.com.au  Fact Sheet for Healthcare Providers:  IncredibleEmployment.be  This test is no t yet approved or cleared by the [place] FDA and  has been authorized for detection and/or diagnosis of SARS-CoV-2 by FDA under an Emergency Use Authorization (EUA). This EUA will remain  in effect (meaning this test can be used) for the duration of the COVID-19 declaration under Section 564(b)(1) of the Act, 21 U.S.C.section 360bbb-3(b)(1), unless the authorization is terminated  or revoked sooner.       Influenza A by PCR NEGATIVE NEGATIVE Final   Influenza B by PCR NEGATIVE NEGATIVE Final    Comment: (NOTE) The Xpert Xpress SARS-CoV-2/FLU/RSV plus assay is intended as an aid in the diagnosis of influenza from Nasopharyngeal swab specimens and should not be used as a sole basis for treatment. Nasal washings and aspirates are unacceptable for Xpert Xpress SARS-CoV-2/FLU/RSV testing.  Fact Sheet for Patients: EntrepreneurPulse.com.au  Fact Sheet for Healthcare Providers: IncredibleEmployment.be  This test is not yet approved or cleared by the [place] FDA and has been authorized for detection and/or  diagnosis of SARS-CoV-2 by FDA under an Emergency Use Authorization (EUA). This EUA will remain in effect (meaning this test can be used) for the duration of the COVID-19 declaration under Section 564(b)(1) of the Act, 21 U.S.C. section 360bbb-3(b)(1), unless the authorization is terminated or revoked.  Performed at [facility], [address] [place], [place] [address]   Body  fluid culture w Gram Stain     Status: None   Collection Time: 08/19/21 12:20 PM   Specimen: PATH Cytology Pleural fluid  Result Value Ref Range Status   Specimen Description   Final    PLEURAL Performed at [facility], [address], [place], [place] [address]    Special Requests   Final    NONE Performed at [facility], [address] [place], [place] [address]    Gram Stain NO WBC SEEN NO ORGANISMS SEEN   Final   Culture   Final    NO GROWTH 3 DAYS Performed at [facility], [place] [address], [place], [place] [address]    Report Status 08/23/2021 FINAL  Final  Fungus Culture With Stain     Status: None (Preliminary result)   Collection Time: 08/19/21 12:20 PM   Specimen: Pleural, Left  Result Value Ref Range Status   Fungus Stain Final report  Final    Comment: (NOTE) Performed At: [facility] [address] [place], [place] [id number] Rush Farmer MD [id number]    Fungus (Mycology) Culture PENDING  Incomplete   Fungal Source PLEURAL  Final    Comment: Performed at [facility], [address] [place], [place] [address]  Fungus Culture Result     Status: None   Collection Time: 08/19/21 12:20 PM  Result Value Ref Range Status   Result 1 Comment  Final    Comment: (NOTE) KOH/Calcofluor preparation:  no fungus observed. Performed At: [facility] [place], [place] [id number] Rush Farmer MD [id number]   CULTURE, BLOOD (ROUTINE X 2) w Reflex to ID Panel     Status: None   Collection Time: 08/19/21  3:29 PM   Specimen: BLOOD  Result Value Ref Range Status   Specimen Description BLOOD BLOOD LEFT FOREARM  Final   Special Requests   Final    BOTTLES DRAWN AEROBIC AND ANAEROBIC Blood Culture adequate volume   Culture   Final    NO GROWTH 5 DAYS Performed at [facility], [address] [place],  [address]    Report Status 08/24/2021 FINAL  Final  CULTURE, BLOOD (ROUTINE  X 2) w Reflex to ID Panel     Status: None   Collection Time: 08/19/21  3:42 PM   Specimen: BLOOD  Result Value Ref Range Status   Specimen Description BLOOD BLOOD RIGHT FOREARM  Final   Special Requests   Final    BOTTLES DRAWN AEROBIC AND ANAEROBIC Blood Culture results may not be optimal due to an inadequate volume of blood received in culture bottles   Culture   Final    NO GROWTH 5 DAYS Performed at [facility], [address], [place],  [address]    Report Status 08/24/2021 FINAL  Final  MRSA Next Gen by PCR, Nasal     Status: None   Collection Time: 08/20/21 12:45 AM  Result Value Ref Range Status   MRSA by PCR Next Gen NOT DETECTED NOT DETECTED Final    Comment: (NOTE) The GeneXpert MRSA Assay (FDA approved for  NASAL specimens only), is one component of a comprehensive MRSA colonization surveillance program. It is not intended to diagnose MRSA infection nor to guide or monitor treatment for MRSA infections. Test performance is not FDA approved in patients less than 34 years old. Performed at [facility], [place] [address], [place], [place] [address]     RN Pressure Injury Documentation:     Estimated body mass index is 17.94 kg/m as calculated from the following:   Height as of this encounter: 5' 9" (1.753 m).   Weight as of this encounter: 55.1 kg.  Malnutrition Type:    Malnutrition Characteristics:   Nutrition Interventions:     Radiology Studies: CARDIAC CATHETERIZATION  Result Date: 08/24/2021 Findings: RA = 9 RV = 59/11 PA = 61/21 (37) PCW = 11 Fick cardiac output/index = 4.8/2.8 PVR = 5.5 Ao sat = 99% PA sat = 66%, 68% PAPI=  4.4 Assessment: 1. Mild to moderate PAH with no evidence of significant RV strain 2. Normal left-sided pressures Plan/Discussion: Likely WHO Group 3 PAH (mild to moderate). Given degree of lung disease not candidate for selective pulmonary artery dilators. Glori Bickers, MD 2:27 PM  DG ESOPHAGUS W SINGLE CM  (SOL OR THIN BA)  Result Date: 08/25/2021 CLINICAL DATA:  Belching. Bloating. Dysphagia. Prior left pneumonectomy. EXAM: ESOPHOGRAM/BARIUM SWALLOW TECHNIQUE: Single contrast examination was performed using  thin barium. FLUOROSCOPY TIME:  Fluoroscopy Time:  2 minutes, 42 seconds Radiation Exposure Index (if provided by the fluoroscopic device): 31.2 mGy Number of Acquired Spot Images: 0 COMPARISON:  CT chest 08/19/2021 FINDINGS: Aortic knob impression on the esophagus accounts for the curvature on image 44 series 2. Lack of aeration of the left hemithorax compatible with known left pneumonectomy. No esophageal stricture observed. Primary peristaltic waves in the esophagus were intact. However, episodes of gastroesophageal reflux were identified. A 13 mm barium tablet transiently impacted at the gastroesophageal junction but subsequently reflux back into the esophagus. This settled back near the gastroesophageal junction but fail to proceed into the stomach over a total five-minute observation time despite the lack of obvious regional stricture. IMPRESSION: 1. Although no specific stricture or narrowing is identified at the gastroesophageal junction, there is impaction of the barium pill in this vicinity with failure to proceed into the stomach. Significance uncertain, sometimes this is simply incidental but this does raise the possibility of a small occult narrowing at the GE junction. 2. Gastroesophageal reflux was observed. 3. Left pneumonectomy. Electronically Signed   By: Van Clines M.D.   On: 08/25/2021 09:28    Scheduled Meds:  aspirin  81 mg Oral Daily   Chlorhexidine Gluconate Cloth  6 each Topical Q0600   enoxaparin (LOVENOX) injection  40 mg Subcutaneous Q24H   mouth rinse  15 mL Mouth Rinse BID   midodrine  10 mg Oral TID WC   simvastatin  20 mg Oral QHS   sodium chloride flush  3 mL Intravenous Q12H   Continuous Infusions:  sodium chloride      LOS: 5 days   Kerney Elbe,  DO [facility] PAGER is on AMION  If 7PM-7AM, please contact night-coverage [email or web address]

## 2021-08-25 NOTE — Progress Notes (Signed)
  Speech Language Pathology Treatment: Dysphagia  Patient Details Name: [name] [name] MRN: [id number] DOB: 10-30-1951 Today's Date: 08/25/2021 Time: 1400-1430 SLP Time Calculation (min) (ACUTE ONLY): 30 min  Assessment / Plan / Recommendation Clinical Impression  Pt seen during lunch meal. He is tolerating mech soft diet well. SLP reinforced basic precautions of dysmotility, but also introduced topic of good food choices, avoiding acidic, processed foods and caffeine for a while to see if his digestive discomfort improves. Pt had many questions about this and could benefit from f/u with OP GI.   HPI HPI: Pt is a 70 y.o. male  who presented to cardiology clinic for evaluation of lower extremity edema and shortness of breath. Pt found to have severe DOE and SOB/ echo noted severe pulhtn and acute right sided heart failure and was send to the ED by cardiologist. CT chest on 9/2: No acute intrathoracic pathology. Status post prior left pneumonectomy with fluid-filled left hemithorax. Pt s/p thoracentesis 9/2 with 972m of thick consistency pleural fluid removed. Pt with 20 lb weight loss in past year and report of "food getting "stuck" making him less willing to eat". Esophagram ordered, SLP consulted, ad pt placed on clear liquid diet. 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      SLP Plan  Continue with current plan of care       Recommendations  Diet recommendations: Dysphagia 3 (mechanical soft);Thin liquid Liquids provided via: Cup;Straw Medication Administration: Whole meds with liquid Supervision: Patient able to self feed                Plan: Continue with current plan of care       GO               [name] MA CBrandonvilleOffice [phone] DLynann Beaver9/07/2021, 2:37 PM

## 2021-08-25 NOTE — Care Management Important Message (Signed)
Important Message  Patient Details  Name: [name] [name] MRN: [id number] Date of Birth: 1951-06-16   Medicare Important Message Given:  Yes     Shelda Altes 08/25/2021, 10:14 AM

## 2021-08-26 ENCOUNTER — Inpatient Hospital Stay (HOSPITAL_COMMUNITY): Payer: Medicare Other | Admitting: Anesthesiology

## 2021-08-26 ENCOUNTER — Encounter (HOSPITAL_COMMUNITY)
Admission: AD | Disposition: A | Discharge status: Home / Self Care | Payer: Self-pay | Source: Other Acute Inpatient Hospital | Attending: Internal Medicine

## 2021-08-26 ENCOUNTER — Inpatient Hospital Stay (HOSPITAL_COMMUNITY): Payer: Medicare Other

## 2021-08-26 DIAGNOSIS — J9612 Chronic respiratory failure with hypercapnia: Secondary | ICD-10-CM

## 2021-08-26 DIAGNOSIS — Q399 Congenital malformation of esophagus, unspecified: Secondary | ICD-10-CM

## 2021-08-26 DIAGNOSIS — R1310 Dysphagia, unspecified: Secondary | ICD-10-CM

## 2021-08-26 DIAGNOSIS — J9611 Chronic respiratory failure with hypoxia: Secondary | ICD-10-CM

## 2021-08-26 HISTORY — PX: BALLOON DILATION: SHX5330

## 2021-08-26 HISTORY — PX: ESOPHAGOGASTRODUODENOSCOPY (EGD) WITH PROPOFOL: SHX5813

## 2021-08-26 LAB — CBC WITH DIFFERENTIAL/PLATELET
Abs Immature Granulocytes: 0.01 10*3/uL (ref 0.00–0.07)
Basophils Absolute: 0 10*3/uL (ref 0.0–0.1)
Basophils Relative: 1 %
Eosinophils Absolute: 0.1 10*3/uL (ref 0.0–0.5)
Eosinophils Relative: 3 %
HCT: 30.9 % — ABNORMAL LOW (ref 39.0–52.0)
Hemoglobin: 9.5 g/dL — ABNORMAL LOW (ref 13.0–17.0)
Immature Granulocytes: 0 %
Lymphocytes Relative: 23 %
Lymphs Abs: 1 10*3/uL (ref 0.7–4.0)
MCH: 28.3 pg (ref 26.0–34.0)
MCHC: 30.7 g/dL (ref 30.0–36.0)
MCV: 92 fL (ref 80.0–100.0)
Monocytes Absolute: 0.4 10*3/uL (ref 0.1–1.0)
Monocytes Relative: 9 %
Neutro Abs: 2.8 10*3/uL (ref 1.7–7.7)
Neutrophils Relative %: 64 %
Platelets: 165 10*3/uL (ref 150–400)
RBC: 3.36 MIL/uL — ABNORMAL LOW (ref 4.22–5.81)
RDW: 12.7 % (ref 11.5–15.5)
WBC: 4.3 10*3/uL (ref 4.0–10.5)
nRBC: 0 % (ref 0.0–0.2)

## 2021-08-26 LAB — COMPREHENSIVE METABOLIC PANEL
ALT: 14 U/L (ref 0–44)
AST: 15 U/L (ref 15–41)
Albumin: 2.9 g/dL — ABNORMAL LOW (ref 3.5–5.0)
Alkaline Phosphatase: 37 U/L — ABNORMAL LOW (ref 38–126)
Anion gap: 7 (ref 5–15)
BUN: 14 mg/dL (ref 8–23)
CO2: 37 mmol/L — ABNORMAL HIGH (ref 22–32)
Calcium: 9.1 mg/dL (ref 8.9–10.3)
Chloride: 92 mmol/L — ABNORMAL LOW (ref 98–111)
Creatinine, Ser: 0.88 mg/dL (ref 0.61–1.24)
GFR, Estimated: >60 mL/min (ref >60)
Glucose, Bld: 86 mg/dL (ref 70–99)
Potassium: 4.2 mmol/L (ref 3.5–5.1)
Sodium: 136 mmol/L (ref 135–145)
Total Bilirubin: 0.6 mg/dL (ref 0.3–1.2)
Total Protein: 5.3 g/dL — ABNORMAL LOW (ref 6.5–8.1)

## 2021-08-26 LAB — MAGNESIUM: Magnesium: 1.8 mg/dL (ref 1.7–2.4)

## 2021-08-26 LAB — RETICULOCYTES
Immature Retic Fract: 4.2 % (ref 2.3–15.9)
RBC.: 4.31 MIL/uL (ref 4.22–5.81)
Retic Count, Absolute: 40.9 10*3/uL (ref 19.0–186.0)
Retic Ct Pct: 1 % (ref 0.4–3.1)

## 2021-08-26 LAB — IRON AND TIBC
Iron: 94 ug/dL (ref 45–182)
Saturation Ratios: 19 % (ref 17.9–39.5)
TIBC: 483 ug/dL — ABNORMAL HIGH (ref 250–450)
UIBC: 389 ug/dL

## 2021-08-26 LAB — FOLATE: Folate: 15.4 ng/mL (ref >5.9)

## 2021-08-26 LAB — GLUCOSE, CAPILLARY
Glucose-Capillary: 66 mg/dL — ABNORMAL LOW (ref 70–99)
Glucose-Capillary: 87 mg/dL (ref 70–99)

## 2021-08-26 LAB — VITAMIN B12: Vitamin B-12: 482 pg/mL (ref 180–914)

## 2021-08-26 LAB — FERRITIN: Ferritin: 72 ng/mL (ref 24–336)

## 2021-08-26 LAB — PHOSPHORUS: Phosphorus: 3.7 mg/dL (ref 2.5–4.6)

## 2021-08-26 SURGERY — ESOPHAGOGASTRODUODENOSCOPY (EGD) WITH PROPOFOL
Anesthesia: Monitor Anesthesia Care

## 2021-08-26 MED ORDER — DEXTROSE 50 % IV SOLN
12.5000 mL | Freq: Once | INTRAVENOUS | Status: AC
  Administered 2021-08-26: 12.5 mL via INTRAVENOUS

## 2021-08-26 MED ORDER — PROPOFOL 10 MG/ML IV BOLUS
INTRAVENOUS | Status: DC | PRN
  Administered 2021-08-26: 30 mg via INTRAVENOUS

## 2021-08-26 MED ORDER — PROPOFOL 500 MG/50ML IV EMUL
INTRAVENOUS | Status: DC | PRN
  Administered 2021-08-26: 75 ug/kg/min via INTRAVENOUS

## 2021-08-26 MED ORDER — LIDOCAINE 2% (20 MG/ML) 5 ML SYRINGE
INTRAMUSCULAR | Status: DC | PRN
  Administered 2021-08-26: 80 mg via INTRAVENOUS

## 2021-08-26 MED ORDER — INFLUENZA VAC A&B SA ADJ QUAD 0.5 ML IM PRSY
0.5000 mL | PREFILLED_SYRINGE | INTRAMUSCULAR | Status: AC
  Administered 2021-09-09: 0.5 mL via INTRAMUSCULAR
  Filled 2021-08-26 (×2): qty 0.5

## 2021-08-26 MED ORDER — DEXTROSE 50 % IV SOLN
INTRAVENOUS | Status: AC
  Filled 2021-08-26: qty 50

## 2021-08-26 MED ORDER — PANTOPRAZOLE SODIUM 40 MG PO TBEC
40.0000 mg | DELAYED_RELEASE_TABLET | Freq: Every day | ORAL | Status: DC
  Administered 2021-08-26 – 2021-09-16 (×22): 40 mg via ORAL
  Filled 2021-08-26 (×23): qty 1

## 2021-08-26 MED ORDER — UMECLIDINIUM BROMIDE 62.5 MCG/INH IN AEPB
1.0000 | INHALATION_SPRAY | Freq: Every day | RESPIRATORY_TRACT | Status: DC
  Administered 2021-08-27 – 2021-09-16 (×21): 1 via RESPIRATORY_TRACT
  Filled 2021-08-26 (×4): qty 7

## 2021-08-26 MED ORDER — LACTATED RINGERS IV SOLN: INTRAVENOUS | Status: DC | PRN

## 2021-08-26 MED ORDER — UMECLIDINIUM BROMIDE 62.5 MCG/INH IN AEPB
1.0000 | INHALATION_SPRAY | Freq: Every day | RESPIRATORY_TRACT | Status: DC
  Filled 2021-08-26: qty 7

## 2021-08-26 MED ORDER — PHENYLEPHRINE 40 MCG/ML (10ML) SYRINGE FOR IV PUSH (FOR BLOOD PRESSURE SUPPORT)
PREFILLED_SYRINGE | INTRAVENOUS | Status: DC | PRN
  Administered 2021-08-26 (×2): 80 ug via INTRAVENOUS

## 2021-08-26 SURGICAL SUPPLY — 14 items

## 2021-08-26 NOTE — Progress Notes (Addendum)
NAMEMccall Lomax, MRN:  [id number], DOB:  Jul 27, 1951, LOS: 6 ADMISSION DATE:  08/20/2021, CONSULTATION DATE:   9/2 REFERRING MD:  [name] [name], REASON FOR CONSULT:  Left sided pleural effusion/empyema  History of Present Illness:  [name] [name] is  70 y.o. M  who is seen in consultation at the request of Dr. Clearence [name] for recommendations on further evaluation and management of left sided pleural effusion/empyema. The patient presented to [facility] ED  on 9/1 with complaints of shortness of breath, lower extremity edema, and abdominal bloating. The patient was admitted to Hospitalist service.   He has a pertinent past medical history of squamous cell lung cancer (s/p pneumonectomy (2005) and chemo with adjuvant cisplatin/vinorelbine), HLD, HTN   On 9/1 he was seen by cardiology and sent to ED for diuresis after an ECHO was obtained that was concerning for pulmonary HTN and RV failure. He He endorsed several months of progressive dyspnea on exertion with increasing LLE edema over the past two weeks. He presented to the ED. Ed workup was notable for SPO2 of 83%, NA 126, BNP 189. CT chest demonstrated a large left sided pleural effusion.    On 9/2 Pulmonology was consulted and recommended CT surgery consult. IR completed a thoracentesis with 97m of thick consistency pleural fluid removed with a thick calcified rind encasing fluid. [facility] the patient having a new levophed requirement and for a CTTS consult.    Pertinent  Medical History  squamous cell lung cancer (s/p pneumonectomy (2005) and chemo with adjuvant cisplatin/vinorelbine), HLD, HTN  Significant Hospital Events: Including procedures, antibiotic start and stop dates in addition to other pertinent events   [facility], [facility] requirement[facility] [facility]   Interim History / Subjective:  Continues to tolerate  BiPAP nocturnally. No acute events.   Objective   Blood pressure 110/72, pulse 82, temperature 97.7 F (36.5 C), temperature source Oral, resp. rate 16, height _0  (1.753 m), weight 55.1 kg, SpO2 100 %.    FiO2 (%):  [40 %] 40 %   Intake/Output Summary (Last 24 hours) at 08/26/2021 0921 Last data filed at 08/26/2021 0600 Gross per 24 hour  Intake 240 ml  Output 725 ml  Net -485 ml    Filed Weights   08/24/21 0500 08/25/21 0649 08/26/21 0600  Weight: 55.5 kg 55.1 kg 55.1 kg    Examination: Thin Adult male, lying in bed watching TV, no distress Diminished Breath Sounds to left, coarse on right, normal effort RRR, no mRG  Alert, follows commands  Abd soft, non-tender, active bowel sounds Skin intact, no edema   Resolved Hospital Problem list     Assessment & Plan:   Acute on chronic hypercapneic respiratory failure - Patient's chronic respiratory failure due to COPD is life threatening.  Previous ABG's have documented high PCO2.  Patient would benefit from non-invasive ventilation.  Without this therapy, the patient is at high risk of ending up with worsening symptoms, worsened respiratory failure, need for ER visits and/or recurrent hospitalizations.  Bilevel device unable to adequately support patient's nocturnal ventilation needs.  Patient would benefit from NIV therapy with set tidal volumes and pressure. Severe emphysema. Hx squamous cell CA s/p left pneumonectomy 2005. - Continue BiPAP here as needed and QHS but ultimately needs home NIV (see above).  - Continue supportive care.  Mild to mod PAH - presumed progressive  group 3 looking at how bad his emphysema has gotten. - Supportive care, not a candidate for selective pulm artery vasodilators.  Shock - resolved with low dose midodrine. Hx HTN, HLD. - Continue midodrine to 41m TID qAC. - Continue to hold home Coreg due to ongoing need for midodrine as above  - Continue home Zocor, ASA.  Unintentional weight loss,  dysphagia - on review of his prior weights he's actually around same weight as 18 months ago at his oncology office visit; he had prior workup for this back in 2020 with CT A/P neg for structural issues.  No e/o swallowing trouble on SLP eval 9/4.  Esophagram 9/8 showed no specific stricture but barium pill did not pass into stomach raising question of GE junction narrowing. - SLP following. - GI consulted, considering EGD today and possible RUQ UKoreavs CT.   Rest per primary team.  [name] [name] PHannaFor pager details, please see AMION or use Epic chat  After 1900, please call EBenedictfor cross coverage needs 08/26/2021, 9:21 AM

## 2021-08-26 NOTE — Consult Note (Addendum)
Referring Provider: Dr. Raiford Noble  Primary Care Physician:  Patient, No Pcp Per (Inactive) Primary Gastroenterologist:  Dr. Olean Ree   Reason for Consultation:  Dysphagia   HPI: [name] [name] is a 70 y.o. male with a past medical history of paroxysmal atrial fibrillation, hypertension, hyperlipidemia, diabetes mellitus type 2, CHF, squamous cell lung cancer s/p pneumonectomy 2005 treated with chemotherapy and adjunct Cisplatin/Vinorelbine, COPD and GERD symptoms.   He initially presented to [facility] ED 08/18/2021 with complaints of shortness of breath for the past 2 months and lower extremity edema which progressively worsened over the past 2 weeks.  In the ED he was hypoxic with oxygen saturation in the 80s.  A chest CT identified a large left-sided pleural effusion s/p thoracentesis with 900 ml of pleural fluid was removed.  He was hypotensive, required Levophed and he was transferred to [facility] 9/2 for further evaluation and treatment.  He underwent a second thoracentesis 9/5 with removal of 500 mL of dark bloody fluid.  Pleural cultures were negative.  He was initially on BiPAP and was weaned to oxygen nasal cannula.  He was also evaluated cardiology.  An echo 08/20/2021 showed a LVEF of 70 to 75%, normal RV function with severely elevated pulmonary artery pressure and moderate to severe TR. His respiratory status has significantly improved and overall  he is clinically stable. However, he reported having difficulty eating food which was misinterpreted as dysphagia. It was thought he had dysphagia, decreased food intake with reported 20lb weight loss over the past year. A  barium swallow study was done 08/25/2021 which showed evidence of gastroesophageal reflux, no specific stricture or narrowing was identified to the esophagus/GE junction but the barium pill did not pass the GE junction.  He was evaluated by SLP and a swallow study was normal, no witnessed dysphagia during the study. A  GI consult was requested for further evaluation regarding possible narrowing at the GE junction per barium swallow study.   He developed epigastric pain approximately 6 months ago for which he saw his PCP. He was started on a PPI and he was referred to GI Dr. Alice Reichert at [facility]. He underwent an EGD and colonoscopy by Dr. Alice Reichert on 5/4/202. The EGD showed a 2 cm hiatal hernia with a normal stomach and duodenum. The colonoscopy was normal. He denied having any dysphagia symptoms at the time of these procedures. He had frequent throat clearing and coughs clear phlegm. He complains of epigastric pain and bloat which occurs after eating and also occurs randomly. No specific food triggers. When he experiences upper abdominal bloat and pain it is difficult for him to "catch his breath". He starts to belch and sometimes passes a lot of gas per the rectum. He took Gas X and Pepto without significant relief. Infrequent heartburn. He takes Pantoprazole 75m QD but doesn't think this medication has been effective. He takes ASA 866mdaily. No other NSAIDs. He drinks 4 to 5 glasses of wine monthly. No drug use. He passes a normal brown stool 1 to 2 times daily, sometimes skips a day. No rectal bleeding or black stools. He past 2 normal formed stools on 9/8. No fever, sweats or chills. He is scheduled to see his oncologist for his annual appointment next week.   Laboratory studies 08/26/2021: Sodium 136.  Potassium 4.2.  Glucose 86.  BUN 14.  Creatinine 0.88.  Phosphorus 3.7.  Magnesium 1.8.  Alk phos 37.  Albumin 2.9.  Total bili 0.6.  AST 15.  ALT 14.  WBC 4.3.  Hemoglobin 9.5.  Hematocrit 30.9.  MCV 92.0.  Platelet 165.  Chest x-ray 08/26/2021: Pending  Barium swallow 08/25/2021: 1. Although no specific stricture or narrowing is identified at the gastroesophageal junction, there is impaction of the barium pill in this vicinity with failure to proceed into the stomach. Significance uncertain, sometimes this is simply  incidental but this does raise the possibility of a small occult narrowing at the GE junction. 2. Gastroesophageal reflux was observed. 3. Left pneumonectomy  EGD 04/20/2021 by Dr. Alice Reichert at [facility]: - 2 cm hiatal hernia. - Normal examined duodenum. - Z-line regular, in the distal esophagus. - The examination was otherwise normal. - No specimens collected  Colonoscopy 04/20/2021: - The entire examined colon is normal on direct and retroflexion views. - No specimens collected. -Recall colonoscopy 5 years  Past Medical History:  Diagnosis Date   Cancer ([place])    lung   CHF (congestive heart failure) (HCC)    Diabetes mellitus without complication ([place])    Dyspnea    Hyperlipidemia    Hypertension     Past Surgical History:  Procedure Laterality Date   COLONOSCOPY WITH PROPOFOL N/A 04/20/2021   Procedure: COLONOSCOPY WITH PROPOFOL;  Surgeon: Toledo, Benay Pike, MD;  Location: [facility] ENDOSCOPY;  Service: Gastroenterology;  Laterality: N/A;   ESOPHAGOGASTRODUODENOSCOPY N/A 04/20/2021   Procedure: ESOPHAGOGASTRODUODENOSCOPY (EGD);  Surgeon: Toledo, Benay Pike, MD;  Location: [facility] ENDOSCOPY;  Service: Gastroenterology;  Laterality: N/A;   LUNG REMOVAL, PARTIAL     RIGHT HEART CATH N/A 08/24/2021   Procedure: RIGHT HEART CATH;  Surgeon: Jolaine Artist, MD;  Location: [place] [facility] LAB;  Service: Cardiovascular;  Laterality: N/A;    Prior to Admission medications   Medication Sig Start Date End Date Taking? Authorizing Provider  acetaminophen (TYLENOL) 325 MG tablet Take 650 mg by mouth every 6 (six) hours as needed for headache or mild pain.   Yes [provider]  albuterol (VENTOLIN HFA) 108 (90 Base) MCG/ACT inhaler Inhale 1-2 puffs into the lungs every 6 (six) hours as needed. 04/04/21  Yes [provider]  ascorbic acid (VITAMIN C) 1000 MG tablet Take 1,000 mg by mouth daily.   Yes [provider]  aspirin 81 MG EC tablet Take 81 mg by mouth daily. 12/30/19  Yes  [provider]  carvedilol (COREG) 3.125 MG tablet Take 3.125 mg by mouth 2 (two) times daily. 07/11/21  Yes [provider]  Cholecalciferol 10 MCG (400 UNIT) CAPS Take 400 Units by mouth daily.   Yes [provider]  furosemide (LASIX) 40 MG tablet Take 40 mg by mouth daily. 08/18/21  Yes [provider]  lisinopril-hydrochlorothiazide (ZESTORETIC) 20-12.5 MG tablet Take 1 tablet by mouth daily. 04/26/11  Yes [provider]  Multiple Vitamin (MULTIVITAMIN) capsule Take 1 capsule by mouth daily.   Yes [provider]  Multiple Vitamins-Minerals (THERA-M) TABS Take 1 tablet by mouth daily.   Yes [provider]  pantoprazole (PROTONIX) 40 MG tablet Take 40 mg by mouth daily.   Yes [provider]  piperacillin-tazobactam (ZOSYN) 3.375 GM/50ML IVPB Inject 50 mLs (3.375 g total) into the vein every 8 (eight) hours. 08/19/21  Yes Val Riles, MD  simvastatin (ZOCOR) 20 MG tablet Take 20 mg by mouth daily.   Yes [provider]  simvastatin (ZOCOR) 20 MG tablet Take 10 mg by mouth at bedtime. Patient not taking: No sig reported 04/26/11   [provider]  Current Facility-Administered Medications  Medication Dose Route Frequency Provider Last Rate Last Admin   0.9 %  sodium chloride infusion  250 mL Intravenous PRN Omar Flaharty, NP       acetaminophen (TYLENOL) tablet 650 mg  650 mg Oral Q4H PRN Omar Wieneke, NP       acetaminophen (TYLENOL) tablet 650 mg  650 mg Oral Q6H PRN Omar Boutwell, NP       aspirin chewable tablet 81 mg  81 mg Oral Daily Omar Holstad, NP   81 mg at 08/25/21 1121   docusate sodium (COLACE) capsule 100 mg  100 mg Oral BID PRN Omar Edmister, NP       enoxaparin (LOVENOX) injection 40 mg  40 mg Subcutaneous Q24H Hayden Pedro M, NP   40 mg at 08/25/21 1121   [START ON 08/27/2021] influenza vaccine adjuvanted (FLUAD) injection 0.5 mL  0.5 mL Intramuscular  Tomorrow-1000 Sheikh, Omair Latif, DO       ipratropium (ATROVENT) nebulizer solution 0.5 mg  0.5 mg Nebulization Q6H PRN Sheikh, Omair Latif, DO       levalbuterol [facility]) nebulizer solution 0.63 mg  0.63 mg Nebulization Q6H PRN Sheikh, Omair Latif, DO       MEDLINE mouth rinse  15 mL Mouth Rinse BID Omar Thau, NP   15 mL at 08/25/21 1123   metoprolol tartrate (LOPRESSOR) injection 5 mg  5 mg Intravenous Q6H PRN Omar Kimoto, NP   5 mg at 08/24/21 0508   midodrine (PROAMATINE) tablet 10 mg  10 mg Oral TID WC Omar Staup, NP   10 mg at 08/25/21 1854   ondansetron (ZOFRAN) injection 4 mg  4 mg Intravenous Q6H PRN Omar Espinal, NP       polyethylene glycol (MIRALAX / GLYCOLAX) packet 17 g  17 g Oral Daily PRN Omar Kemppainen, NP       simvastatin (ZOCOR) tablet 20 mg  20 mg Oral QHS Hayden Pedro M, NP   20 mg at 08/25/21 2209   sodium chloride flush (NS) 0.9 % injection 3 mL  3 mL Intravenous Q12H Omar Forse, NP   3 mL at 08/25/21 1123   sodium chloride flush (NS) 0.9 % injection 3 mL  3 mL Intravenous PRN Omar Eldred, NP        Allergies as of 08/19/2021   (No Known Allergies)    History reviewed. No pertinent family history.  Social History   Socioeconomic History   Marital status: Single    Spouse name: Not on file   Number of children: Not on file   Years of education: Not on file   Highest education level: Not on file  Occupational History   Not on file  Tobacco Use   Smoking status: Former   Smokeless tobacco: Never  Vaping Use   Vaping Use: Never used  Substance and Sexual Activity   Alcohol use: Yes    Comment: rare   Drug use: Not Currently   Sexual activity: Not Currently  Other Topics Concern   Not on file  Social History Narrative   Not on file   Social Determinants of Health   Financial Resource Strain: Not on file  Food Insecurity: Not on file  Transportation Needs: Not on file  Physical Activity:  Not on file  Stress: Not on file  Social Connections: Not on file  Intimate Partner Violence: Not on file    Review of  Systems: Gen: Denies fever, sweats or chills. No weight loss.  CV: Denies chest pain, palpitations or edema. Resp: See HPI. GI: See HPI. GU : Denies urinary burning, blood in urine, increased urinary frequency or incontinence. MS: Denies joint pain, muscles aches or weakness. Derm: Denies rash, itchiness, skin lesions or unhealing ulcers. Psych: Denies depression, anxiety or memory loss. Heme: Denies easy bruising, bleeding. Neuro:  Denies headaches, dizziness or paresthesias. Endo:  Denies any problems with DM, thyroid or adrenal function.  Physical Exam: Vital signs in last 24 hours: Temp:  [97.8 F (36.6 C)-98.8 F (37.1 C)] 97.8 F (36.6 C) (09/09 0600) Pulse Rate:  [72-100] 79 (09/09 0600) Resp:  [15-18] 16 (09/09 0600) BP: (105-126)/(65-87) 105/72 (09/09 0600) SpO2:  [98 %-100 %] 100 % (09/09 0600) FiO2 (%):  [40 %] 40 % (09/09 0323) Weight:  [55.1 kg] 55.1 kg (09/09 0600) Last BM Date: 08/25/21 General:  Alert 70 year old male in no acute distress. Head:  Normocephalic and atraumatic. Eyes:  No scleral icterus. Conjunctiva pink. Ears:  Normal auditory acuity. Nose:  No deformity, discharge or lesions. Mouth:  Dentition intact. No ulcers or lesions.  Neck:  Supple. No lymphadenopathy or thyromegaly.  Lungs: Breath sounds clear slightly diminished to the left base.  No wheezes or rhonchi.  On oxygen 2 L nasal cannula. Heart: Regular rate and rhythm, no murmurs. Abdomen: Soft, nondistended.  Mild epigastric tenderness without rebound or guarding.  Positive bowel sounds to all 4 quadrants. Rectal: Deferred. Musculoskeletal:  Symmetrical without gross deformities.  Pulses:  Normal pulses noted. Extremities:  Without clubbing or edema. Neurologic:  Alert and  oriented x4. No focal deficits.  Skin:  Intact without significant lesions or rashes. Psych:   Alert and cooperative. Normal mood and affect.  Intake/Output from previous day: 09/08 0701 - 09/09 0700 In: 240 [P.O.:240] Out: 725 [Urine:725] Intake/Output this shift: No intake/output data recorded.  Lab Results: Recent Labs    08/24/21 0409 08/24/21 1234 08/25/21 0124 08/26/21 0221  WBC 4.5  --  4.6 4.3  HGB 10.8* 12.9*  12.6* 10.7* 9.5*  HCT 34.9* 38.0*  37.0* 34.3* 30.9*  PLT 160  --  174 165   BMET Recent Labs    08/24/21 0409 08/24/21 1234 08/25/21 0124 08/26/21 0221  NA 135 136  137 134* 136  K 4.4 4.5  4.3 4.1 4.2  CL 90*  --  92* 92*  CO2 39*  --  37* 37*  GLUCOSE 87  --  87 86  BUN 12  --  15 14  CREATININE 0.84  --  1.17 0.88  CALCIUM 9.1  --  8.7* 9.1   LFT Recent Labs    08/26/21 0221  PROT 5.3*  ALBUMIN 2.9*  AST 15  ALT 14  ALKPHOS 37*  BILITOT 0.6   PT/INR No results for input(s): LABPROT, INR in the last 72 hours. Hepatitis Panel No results for input(s): HEPBSAG, HCVAB, HEPAIGM, HEPBIGM in the last 72 hours.    Studies/Results: CARDIAC CATHETERIZATION  Result Date: 08/24/2021 Findings: RA = 9 RV = 59/11 PA = 61/21 (37) PCW = 11 Fick cardiac output/index = 4.8/2.8 PVR = 5.5 Ao sat = 99% PA sat = 66%, 68% PAPI=  4.4 Assessment: 1. Mild to moderate PAH with no evidence of significant RV strain 2. Normal left-sided pressures Plan/Discussion: Likely WHO Group 3 PAH (mild to moderate). Given degree of lung disease not candidate for selective pulmonary artery dilators. Glori Bickers, MD 2:27 PM  DG  ESOPHAGUS W SINGLE CM (SOL OR THIN BA)  Result Date: 08/25/2021 CLINICAL DATA:  Belching. Bloating. Dysphagia. Prior left pneumonectomy. EXAM: ESOPHOGRAM/BARIUM SWALLOW TECHNIQUE: Single contrast examination was performed using  thin barium. FLUOROSCOPY TIME:  Fluoroscopy Time:  2 minutes, 42 seconds Radiation Exposure Index (if provided by the fluoroscopic device): 31.2 mGy Number of Acquired Spot Images: 0 COMPARISON:  CT chest 08/19/2021  FINDINGS: Aortic knob impression on the esophagus accounts for the curvature on image 44 series 2. Lack of aeration of the left hemithorax compatible with known left pneumonectomy. No esophageal stricture observed. Primary peristaltic waves in the esophagus were intact. However, episodes of gastroesophageal reflux were identified. A 13 mm barium tablet transiently impacted at the gastroesophageal junction but subsequently reflux back into the esophagus. This settled back near the gastroesophageal junction but fail to proceed into the stomach over a total five-minute observation time despite the lack of obvious regional stricture. IMPRESSION: 1. Although no specific stricture or narrowing is identified at the gastroesophageal junction, there is impaction of the barium pill in this vicinity with failure to proceed into the stomach. Significance uncertain, sometimes this is simply incidental but this does raise the possibility of a small occult narrowing at the GE junction. 2. Gastroesophageal reflux was observed. 3. Left pneumonectomy. Electronically Signed   By: Van Clines M.D.   On: 08/25/2021 09:28    IMPRESSION/PLAN:  68) 70 year old male admitted to the hospital with acute respiratory failure secondary to a large pleural effusion with epigastric pain/bloat which has progressively worsened over the past 6 months. He does not have dysphagia. S/P EGD at [facility] 04/20/2021 showed a 2 cm hiatal hernia otherwise was normal.  Swallow study by speech pathology 9/4 was normal.  Barium swallow 9/8 showed evidence of gastroesophageal reflux and the barium pill did not pass the GE junction without identifying an obvious stricture or narrowing. -He is NPO for now, EGD tentatively scheduled today. Await further recommendation per Dr. Lyndel Safe  -Recommend RUQ sono and CTAP to evaluate the gallbladder and to rule out evidence of any intra abdominal/pelvic malignant process -Pantoprazole 31m QD -IV fluids and pain  management per the hospitalist   2) Acute normocytic anemia. No overt GI bleeding. Admission Hg 15.8 on 9/1 -> 14 -> 13.3 -> 12.9 -> 10.7 -> Hg 9.5. MCV 92. EGD and colonoscopy 04/2021 did not identify etiology for anemia.  -See plan in # 1 -Iron, TIBC, ferritin and B!2 level -FOBT  3) COPD and squamous cell lung cancer s/p left pneumonectomy 2005 treated with chemo (no radiation) presented to the hospital 08/18/2021 with acute on chronic respiratory failure secondary to a large pleural effusion s/p thoracentesis x 2. On 2L [place].   4) Pulmonary hypertension  5) Severe TR         CPatrecia PourKennedy-Smith  08/26/2021, 8:30 AM   Attending physician's note   I have taken an interval history, reviewed the chart and examined the patient. I agree with the Advanced Practitioner's note, impression and recommendations.  I have also reviewed previous EGD/colonoscopy reports.  Barium swallow reviewed.  Postprandial epi pain with occ dysphagia. Ba Swallow 9/8 showed hang-up of barium tablet at GE junction.  Likely at the site of Schatzki's ring/distal stricture.   Adm d/t respiratory failure d/t large pleural effusion s/p thoracocentesis.  Underlying history of COPD and squamous cell lung CA s/p left pneumonectomy 2005. OK to undergo EGD per pulmonary  Anemia of chronic disease. Neg EGD/colon 04/2021 for etiology.  Plan: -I  think he would benefit from EGD with dil. -Would continue Protonix 40 mg PO QD -CT AP if still with abdo pain.   I have discussed the risks and benefits of EGD. The risks including rare risk of perforation, bleeding, missed UGI neoplasms, risks of anesthesia/sedation. Patient is aware and agrees to proceed. All the questions were answered.    Carmell Austria, MD [facility] [phone]

## 2021-08-26 NOTE — Anesthesia Procedure Notes (Signed)
Procedure Name: MAC Date/Time: 08/26/2021 11:15 AM Performed by: Trinna Post., CRNA Pre-anesthesia Checklist: Patient identified, Emergency Drugs available, Suction available, Patient being monitored and Timeout performed Patient Re-evaluated:Patient Re-evaluated prior to induction Oxygen Delivery Method: Nasal cannula Preoxygenation: Pre-oxygenation with 100% oxygen Induction Type: IV induction Placement Confirmation: positive ETCO2

## 2021-08-26 NOTE — Anesthesia Preprocedure Evaluation (Signed)
Anesthesia Evaluation  Patient identified by MRN, date of birth, ID band Patient awake    Reviewed: Allergy & Precautions, NPO status , Patient's Chart, lab work & pertinent test results  Airway Mallampati: II  TM Distance: >3 FB Neck ROM: Full    Dental no notable dental hx. (+) Teeth Intact, Dental Advisory Given   Pulmonary former smoker,    Pulmonary exam normal breath sounds clear to auscultation       Cardiovascular hypertension, Normal cardiovascular exam Rhythm:Regular Rate:Normal  08/20/21 Echo 1. Left ventricular ejection fraction, by estimation, is 70 to 75%. The  left ventricle has hyperdynamic function. The left ventricle has no  regional wall motion abnormalities. Left ventricular diastolic parameters  were normal.  2. Right ventricular systolic function is normal. The right ventricular  size is mildly enlarged. There is severely elevated pulmonary artery  systolic pressure.  3. Right atrial size was mildly dilated.  4. There is heterogeneous echodense material in the left pleural space.  The pericardial effusion is circumferential.  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 No evidence of mitral valve  regurgitation.  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  7. Cannot exclude a bicuspid aortic valve. The aortic valve has an  indeterminant number of cusps. There is severe calcification of the aortic  valve, particularly the anterior (right) coronary cusp. 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 Aortic  valve regurgitation is mild.  8. The inferior vena cava is dilated in size with <50% respiratory  variability, suggesting right atrial pressure of 15 mmHg. (this is an  uncertain estimation due to noninvasive positive pressure ventilation).   Comparison(s): A prior study was performed on 04/06/2013. Prior images  unable to be directly viewed, comparison  made by report only. PA pressure  is now much higher (was 30 mm Hg).    Neuro/Psych    GI/Hepatic negative GI ROS, Neg liver ROS,   Endo/Other  diabetes  Renal/GU      Musculoskeletal   Abdominal   Peds  Hematology   Anesthesia Other Findings   Reproductive/Obstetrics                             Anesthesia Physical Anesthesia Plan  ASA: 3  Anesthesia Plan: MAC   Post-op Pain Management:    Induction:   PONV Risk Score and Plan: Treatment may vary due to age or medical condition  Airway Management Planned: Natural Airway and Nasal Cannula  Additional Equipment: None  Intra-op Plan:   Post-operative Plan:   Informed Consent: I have reviewed the patients History and Physical, chart, labs and discussed the procedure including the risks, benefits and alternatives for the proposed anesthesia with the patient or authorized representative who has indicated his/her understanding and acceptance.     Dental advisory given  Plan Discussed with:   Anesthesia Plan Comments:         Anesthesia Quick Evaluation

## 2021-08-26 NOTE — Transfer of Care (Signed)
Immediate Anesthesia Transfer of Care Note  Patient: [name] [name]  Procedure(s) Performed: ESOPHAGOGASTRODUODENOSCOPY (EGD) WITH PROPOFOL Balloon dilation wire-guided  Patient Location: PACU and Endoscopy Unit  Anesthesia Type:MAC  Level of Consciousness: drowsy  Airway & Oxygen Therapy: Patient Spontanous Breathing and Patient connected to nasal cannula oxygen  Post-op Assessment: Report given to RN and Post -op Vital signs reviewed and stable  Post vital signs: Reviewed and stable  Last Vitals:  Vitals Value Taken Time  BP 92/50 08/26/21 1151  Temp    Pulse 80 08/26/21 1152  Resp 18 08/26/21 1152  SpO2 100 % 08/26/21 1152  Vitals shown include unvalidated device data.  Last Pain:  Vitals:   08/26/21 1151  TempSrc:   PainSc: 0-No pain      Patients Stated Pain Goal: 0 (34/03/52 4818)  Complications: No notable events documented.

## 2021-08-26 NOTE — Progress Notes (Signed)
[name] [name] Warwick  XBM:[id number] DOB: 1951/07/05 DOA: 08/20/2021 PCP: Patient, No Pcp Per (Inactive)   Brief Narrative:  The patient is a 70 year old AAM who was admitted by the hospitalist service and subsequently transferred to the critical care service for evaluation management of left-sided pleural effusion and empyema.  He initially presented to [facility] ED on 08/18/2021 with complaints of shortness of breath, lower extremity edema and abdominal bloating.  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  He endorsed several months of progressive dyspnea on exertion with increasing left lower extremity edema over the past 2 weeks.  He presented ED and ED work-up was notable for being hypoxic on room air with a sodium of 126 and a BNP of 189.  CT of the chest demonstrated a large left-sided pleural effusion.  Pulmonary was consulted and 08/19/2021 and CT recommended CT surgery consult.  IR completed a thoracentesis with 900 mL of thick consistency pleural fluid removed with a thick calcified rind encasing fluid.  He was transferred to the ICU at [facility] given his shock and also for evaluation by cardiothoracic surgery.  His shock is now resolved and he is transferred back to the [facility] service on 08/25/2021.  Currently is being admitted and treated for acute on chronic hypercapnic respiratory failure in the setting of severe emphysema and history of squamous cell carcinoma with status post left pneumonectomy. Because of his Dysphagia GI was consulted and he underwent an EGD today.   Assessment & Plan:   Active Problems:   Pleural effusion   Shock ([place])   Acute respiratory failure with hypoxia and hypercapnia (HCC)    Hyponatremia  Acute on chronic respiratory failure with hypoxia and hypercapnia in the setting of pleural effusion and COPD -Admitted and then transferred to the ICU and now is out of ICU -Patient has severe emphysema and has a history of squamous cell carcinoma status post left pneumonectomy in 2005 -Pulmonary has been consulted recommending continue BiPAP as needed and nightly -He is status postthoracentesis x2 -ABG    Component Value Date/Time   PHART 7.378 08/23/2021 0920   PCO2ART 76.1 (HH) 08/23/2021 0920   PO2ART 156 (H) 08/23/2021 0920   HCO3 40.9 (H) 08/24/2021 1234   HCO3 42.4 (H) 08/24/2021 1234   TCO2 43 (H) 08/24/2021 1234   TCO2 45 (H) 08/24/2021 1234   O2SAT 68.0 08/24/2021 1234   O2SAT 66.0 08/24/2021 1234    -They are considering obtaining a morning ABG to improve nocturnal hypercapnia and the need for NIV at home -They are recommending continue supportive care -Currently not on any more breathing treatments but will add Xopenex and Atrovent as needed -Continue with pulm toileting -Pulmonary is arranging Trilogy Vent prior to D/C and planning of outpatient Sleep Study and outpatient PFTs   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  -In the setting of COPD -Echocardiogram was consistent with moderate pulmonary hypertension with very mild right ventricular strain -Underwent a right heart cath to quantify pulm hypertension and evaluate his volume status -Cardiology recommending appropriate oxygenation optimizing volume status -Currently not volume overloaded on exam  Hypotension in the setting of shock -Initially required Levophed but now resolved after midodrine we will continue midodrine -There is also initial concern for  septic shock and received IV Zosyn x2 but fluid cultures been negative in antibiotics have been discontinued -Continuing low-dose midodrine 5 mg 3 times daily  Hyperlipidemia -Continue statin  Lower Extremity Edema -Improved  and he was started on Lasix as an outpatient -Cardiology recommending holding Lasix given recent hypotension and that because he does not appear clinically volume overloaded  COPD -Continue supportive care as above and currently does not appear to be exacerbated  Hypertension but currently was hypotensive so we will hold his medications -He has been on Coreg, lisinopril/HCTZ and Lasix in the outpatient setting but has been held -Continue to Monitor BP per Protocol -Last BP reading was 140/91  Hyponatremia -Mild.  -Na+ went from 135 -> 137 -> 134 -> 136 -Continue to Monitor and Trend -Repeat CMP in the AM   Normocytic Anemia  -Hemoglobin/hematocrit dropped from admission and trended all the way down and has slowly trended down and went from 10.8/34.9 -> 10.7/34.3 -> 9.5/30.9  -Anemia Panel showed an iron level of 94, U IBC 389, TIBC 43, saturation ratios of 19%, ferritin of 72, folate of 15.4, vitamin B12 482 -Continue to Monitor for S/Sx of Bleeding; No overt Bleeding noted -Repeat CBC in the AM  Unintentional weight loss and dysphagia -He is about the same weight as 18 months ago at his oncology visit -Had a prior work-up back in 2022 with a CT abdomen pelvis was negative for structural issues -Had SLP on 9 4 and an esophagram on 9 8 which showed no specific stricture but barium pill did not pass of the stomach raising question of GE junction narrowing -SLP following and they are recommending dysphagia diet -GI will evaluate the patient for further evaluation for possible EGD and dilation -Patient underwent EGD today and will be getting a Abodminal U/S in the AM -EGD showed "The lower third of the esophagus was mildly tortuous. Z-line was well-defined at 38 cm, examined by NBI. The lower esophagus did distend well. No hiatal hernias. Since patient had abn barium swallow, A TTS dilator was passed through the scope. Dilation with a 15-16.5-18 mm balloon dilator was performed to 16.5 mm.  The scope was withdrawn. Dilation was performed with a Maloney dilator with mild resistance at 50 Fr. The entire examined stomach was normal. The retroflexed examination of the cardia revealed GE junction flap to be Hill's Gd 1 The examined duodenum was normal." Overall impression was "Mild presbyesophagus s/p esophageal dilatation. Otherwise normal EGD." -GI recommending Soft Diet and continuing PPI 40 mg po Dailyand obtaining U/S of Abdomen in the AM  DVT prophylaxis: Enoxaparin 40 mg subcu q. 24 Code Status: FULL CODE  Family Communication: No family present at bedside Disposition Plan: Pending further clinical improvement Status is: Inpatient  Remains inpatient appropriate because:Unsafe d/c plan, IV treatments appropriate due to intensity of illness or inability to take PO, and Inpatient level of care appropriate due to severity of illness  Dispo: The patient is from: Home              Anticipated d/c is to:  TBD              Patient currently is not medically stable to d/c.   Difficult to place patient No  Consultants:  Cardiovascular surgery PCCM Cardiology  Procedures:  9/2 [place]ora with 900cc removed, new levophed requirement, transfer to [facility] 9/6 [place]ora with 500cc removed  [facility] 9/7 with mild to mod PAH, normal left sided pressures.    EGD Findings:  The lower third of the esophagus was mildly tortuous. Z-line was       well-defined at 38 cm, examined by NBI. The lower esophagus did distend       well. No hiatal hernias. Since patient had abn barium swallow, A TTS       dilator was passed through the scope. Dilation with a 15-16.5-18 mm       balloon dilator was performed to 16.5 mm. The scope was withdrawn.       Dilation was performed with a Maloney dilator with mild resistance at 50       Fr.      The entire examined stomach was normal. The retroflexed examination of       the cardia revealed GE junction flap to be Hill's Gd 1      The examined duodenum was  normal. Impression:               - Mild presbyesophagus s/p esophageal dilatation.                           - Otherwise normal EGD.                           - No specimens collected. Recommendation:           - Return patient to hospital ward for ongoing care.                           - Soft diet today. Then advance as tolerated.                           - Continue present medications including PPIs.                           - Obtained US abdo in AM for epi pain.                           - The findings and recommendations were discussed                            with the patient's family.  Antimicrobials:  Anti-infectives (From admission, onward)    Start     Dose/Rate Route Frequency Ordered Stop   08/20/21 0600  piperacillin-tazobactam (ZOSYN) IVPB 3.375 g  Status:  Discontinued        3.375 g 12.5 mL/hr over 240 Minutes Intravenous Every 8 hours 08/20/21 0133 08/21/21 1050        Subjective: Seen and examined at bedside after his EGD and he states that he still having some abdominal pain has been going on for a long time.  Felt okay.  Wearing his NIV at night.  No chest pain or shortness of breath.  No other concerns or complaints at this time and appears euvolemic.  Objective: Vitals:   08/26/21 1029 08/26/21 1151 08/26/21 1200 08/26/21 1209  BP: (!) 185/96 (!) 92/50 (!) 106/57 110/61  Pulse: 83 82 79 78  Resp: _0 Temp: 98.3 F (36.8 C) 97.8 F (36.6 C)    TempSrc: Temporal Axillary    SpO2: 100% 100% 100% 100%  Weight:      Height:  Intake/Output Summary (Last 24 hours) at 08/26/2021 1615 Last data filed at 08/26/2021 1445 Gross per 24 hour  Intake 680 ml  Output 1225 ml  Net -545 ml    Filed Weights   08/24/21 0500 08/25/21 0649 08/26/21 0600  Weight: 55.5 kg 55.1 kg 55.1 kg   Examination: Physical Exam:  Constitutional: Thin chronically ill-appearing African-American male currently in no acute distress appears calm Eyes: Lids and  conjunctivae normal, sclerae anicteric  ENMT: External Ears, Nose appear normal.  Neck: Appears normal, supple, no cervical masses, normal ROM, no appreciable thyromegaly; no JVD Respiratory: Diminished to auscultation bilaterally with coarse breath sounds, no wheezing, rales, rhonchi or crackles. Normal respiratory effort and patient is not tachypenic. No accessory muscle use.  Wearing supplemental oxygen nasal cannula Cardiovascular: Mildly tachycardic rate, no murmurs / rubs / gallops. S1 and S2 auscultated.  No appreciable extremity edema Abdomen: Soft, non-tender, non-distended. Bowel sounds positive.  GU: Deferred. Musculoskeletal: No clubbing / cyanosis of digits/nails. No joint deformity upper and lower extremities.  Skin: No rashes, lesions, ulcers. No induration; Warm and dry.  Neurologic: CN 2-12 grossly intact with no focal deficits. Romberg sign and cerebellar reflexes not assessed.  Psychiatric: Normal judgment and insight. Alert and oriented x 3. Normal mood and appropriate affect.   Data Reviewed: I have personally reviewed following labs and imaging studies  CBC: Recent Labs  Lab 08/21/21 0622 08/22/21 0500 08/22/21 0808 08/23/21 0600 08/23/21 0920 08/24/21 0409 08/24/21 1234 08/25/21 0124 08/26/21 0221  WBC 5.8 5.5  --  4.8  --  4.5  --  4.6 4.3  NEUTROABS 4.3  --   --   --   --   --   --   --  2.8  HGB 11.1* 11.0*   < > 10.2* 11.2* 10.8* 12.9*  12.6* 10.7* 9.5*  HCT 35.8* 36.9*   < > 33.2* 33.0* 34.9* 38.0*  37.0* 34.3* 30.9*  MCV 91.3 94.1  --  93.5  --  92.1  --  91.2 92.0  PLT 164 141*  --  141*  --  160  --  174 165   < > = values in this interval not displayed.    Basic Metabolic Panel: Recent Labs  Lab 08/20/21 0415 08/20/21 1003 08/22/21 0500 08/22/21 0808 08/23/21 0600 08/23/21 0920 08/24/21 0409 08/24/21 1234 08/25/21 0124 08/26/21 0221  NA 132*   < > 134*   < > 135 135 135 136  137 134* 136  K 4.0   < > 4.9   < > 4.1 4.2 4.4 4.5  4.3  4.1 4.2  CL 89*   < > 88*  --  91*  --  90*  --  92* 92*  CO2 37*   < > 45*  --  41*  --  39*  --  37* 37*  GLUCOSE 96   < > 88  --  83  --  87  --  87 86  BUN 17   < > 10  --  12  --  12  --  15 14  CREATININE 1.17   < > 0.92  --  0.79  --  0.84  --  1.17 0.88  CALCIUM 8.7*   < > 8.7*  --  8.8*  --  9.1  --  8.7* 9.1  MG 1.8   < > 1.9  --  1.7  --  2.1  --  1.9 1.8  PHOS 4.5  --   --   --   --   --   --   --  3.7 3.7   < > = values in this interval not displayed.    GFR: Estimated Creatinine Clearance: 63.5 mL/min (by C-G formula based on SCr of 0.88 mg/dL). Liver Function Tests: Recent Labs  Lab 08/21/21 0622 08/26/21 0221  AST 15 15  ALT 14 14  ALKPHOS 41 37*  BILITOT 1.0 0.6  PROT 5.3* 5.3*  ALBUMIN 2.8* 2.9*    No results for input(s): LIPASE, AMYLASE in the last 168 hours. No results for input(s): AMMONIA in the last 168 hours. Coagulation Profile: No results for input(s): INR, PROTIME in the last 168 hours. Cardiac Enzymes: No results for input(s): CKTOTAL, CKMB, CKMBINDEX, TROPONINI in the last 168 hours. BNP (last 3 results) No results for input(s): PROBNP in the last 8760 hours. HbA1C: No results for input(s): HGBA1C in the last 72 hours.  CBG: Recent Labs  Lab 08/24/21 2026 08/25/21 0823 08/25/21 1632 08/26/21 1033 08/26/21 1112  GLUCAP 112* 88 68* 66* 87    Lipid Profile: No results for input(s): CHOL, HDL, LDLCALC, TRIG, CHOLHDL, LDLDIRECT in the last 72 hours. Thyroid Function Tests: No results for input(s): TSH, T4TOTAL, FREET4, T3FREE, THYROIDAB in the last 72 hours. Anemia Panel: Recent Labs    08/26/21 1243  VITAMINB12 482  FOLATE 15.4  FERRITIN 72  TIBC 483*  IRON 94  RETICCTPCT 1.0   Sepsis Labs: Recent Labs  Lab 08/20/21 0413  PROCALCITON <0.10  LATICACIDVEN 0.7    Recent Results (from the past 240 hour(s))  Resp Panel by RT-PCR (Flu A&B, Covid) Nasopharyngeal Swab     Status: None   Collection Time: 08/18/21  7:41 PM    Specimen: Nasopharyngeal Swab; Nasopharyngeal(NP) swabs in vial transport medium  Result Value Ref Range Status   SARS Coronavirus 2 by RT PCR NEGATIVE NEGATIVE Final    Comment: (NOTE) SARS-CoV-2 target nucleic acids are NOT DETECTED.  The SARS-CoV-2 RNA is generally detectable in upper respiratory specimens during the acute phase of infection. The lowest concentration of SARS-CoV-2 viral copies this assay can detect is 138 copies/mL. A negative result does not preclude SARS-Cov-2 infection and should not be used as the sole basis for treatment or other patient management decisions. A negative result may occur with  improper specimen collection/handling, submission of specimen other than nasopharyngeal swab, presence of viral mutation(s) within the areas targeted by this assay, and inadequate number of viral copies(<138 copies/mL). A negative result must be combined with clinical observations, patient history, and epidemiological information. The expected result is Negative.  Fact Sheet for Patients:  EntrepreneurPulse.com.au  Fact Sheet for Healthcare Providers:  IncredibleEmployment.be  This test is no t yet approved or cleared by the [place] FDA and  has been authorized for detection and/or diagnosis of SARS-CoV-2 by FDA under an Emergency Use Authorization (EUA). This EUA will remain  in effect (meaning this test can be used) for the duration of the COVID-19 declaration under Section 564(b)(1) of the Act, 21 U.S.C.section 360bbb-3(b)(1), unless the authorization is terminated  or revoked sooner.       Influenza A by PCR NEGATIVE NEGATIVE Final   Influenza B by PCR NEGATIVE NEGATIVE Final    Comment: (NOTE) The Xpert Xpress SARS-CoV-2/FLU/RSV plus assay is intended as an aid in the diagnosis of influenza from Nasopharyngeal swab specimens and should not be used as a sole basis for treatment. Nasal washings and aspirates are  unacceptable for Xpert Xpress SARS-CoV-2/FLU/RSV testing.  Fact Sheet for Patients: EntrepreneurPulse.com.au  Fact Sheet for Healthcare Providers: IncredibleEmployment.be  This test is not yet approved or cleared by the [place] and has been authorized for detection and/or diagnosis of SARS-CoV-2 by FDA under an Emergency Use Authorization (EUA). This EUA will remain in effect (meaning this test can be used) for the duration of the COVID-19 declaration under Section 564(b)(1) of the Act, 21 U.S.C. section 360bbb-3(b)(1), unless the authorization is terminated or revoked.  Performed at [facility], [address] [place], [place] [address]   Body fluid culture w Gram Stain     Status: None   Collection Time: 08/19/21 12:20 PM   Specimen: PATH Cytology Pleural fluid  Result Value Ref Range Status   Specimen Description   Final    PLEURAL Performed at [facility], [address], [place], [place] [address]    Special Requests   Final    NONE Performed at [facility], [address] [place], [place] [address]    Gram Stain NO WBC SEEN NO ORGANISMS SEEN   Final   Culture   Final    NO GROWTH 3 DAYS Performed at [facility], [place] [address], [place], [place] [address]    Report Status 08/23/2021 FINAL  Final  Fungus Culture With Stain     Status: None (Preliminary result)   Collection Time: 08/19/21 12:20 PM   Specimen: Pleural, Left  Result Value Ref Range Status   Fungus Stain Final report  Final    Comment: (NOTE) Performed At: [facility] [address] [place], [place] [id number] Rush Farmer MD [id number]    Fungus (Mycology) Culture PENDING  Incomplete   Fungal Source PLEURAL  Final    Comment: Performed at [facility], [address] [place], [place] [address]  Fungus Culture Result     Status: None   Collection Time: 08/19/21 12:20 PM  Result Value  Ref Range Status   Result 1 Comment  Final    Comment: (NOTE) KOH/Calcofluor preparation:  no fungus observed. Performed At: [facility] [place], [place] [id number] Rush Farmer MD [id number]   CULTURE, BLOOD (ROUTINE X 2) w Reflex to ID Panel     Status: None   Collection Time: 08/19/21  3:29 PM   Specimen: BLOOD  Result Value Ref Range Status   Specimen Description BLOOD BLOOD LEFT FOREARM  Final   Special Requests   Final    BOTTLES DRAWN AEROBIC AND ANAEROBIC Blood Culture adequate volume   Culture   Final    NO GROWTH 5 DAYS Performed at [facility], [address] [place], [place] [address]    Report Status 08/24/2021 FINAL  Final  CULTURE, BLOOD (ROUTINE X 2) w Reflex to ID Panel     Status: None   Collection Time: 08/19/21  3:42 PM   Specimen: BLOOD  Result Value Ref Range Status   Specimen Description BLOOD BLOOD RIGHT FOREARM  Final   Special Requests   Final    BOTTLES DRAWN AEROBIC AND ANAEROBIC Blood Culture results may not be optimal due to an inadequate volume of blood received in culture bottles   Culture   Final    NO GROWTH 5 DAYS Performed at [facility], [address] [place], [place] [address]    Report Status 08/24/2021 FINAL  Final  MRSA Next Gen by PCR, Nasal     Status: None   Collection Time: 08/20/21 12:45 AM  Result Value Ref Range Status   MRSA by PCR  Next Gen NOT DETECTED NOT DETECTED Final    Comment: (NOTE) The GeneXpert MRSA Assay (FDA approved for NASAL specimens only), is one component of a comprehensive MRSA colonization surveillance program. It is not intended to diagnose MRSA infection nor to guide or monitor treatment for MRSA infections. Test performance is not FDA approved in patients less than 98 years old. Performed at [facility], [place] [address] [place], [place] [address]      RN Pressure Injury Documentation:     Estimated body mass index is 17.93 kg/m as  calculated from the following:   Height as of this encounter: _0  (1.753 m).   Weight as of this encounter: 55.1 kg.  Malnutrition Type:    Malnutrition Characteristics:   Nutrition Interventions:     Radiology Studies: DG CHEST PORT 1 VIEW  Result Date: 08/26/2021 CLINICAL DATA:  Shortness of breath.  History of left pneumonectomy. EXAM: PORTABLE CHEST 1 VIEW COMPARISON:  08/22/2021 FINDINGS: Complete opacification of the left hemithorax with pleural based calcifications. These findings are unchanged. Right lung is clear. Heart and mediastinum are obscured by the opacification in the left hemithorax. IMPRESSION: 1. No acute disease. 2. Stable changes from left pneumonectomy. Electronically Signed   By: Markus Daft M.D.   On: 08/26/2021 08:52   DG ESOPHAGUS W SINGLE CM (SOL OR THIN BA)  Result Date: 08/25/2021 CLINICAL DATA:  Belching. Bloating. Dysphagia. Prior left pneumonectomy. EXAM: ESOPHOGRAM/BARIUM SWALLOW TECHNIQUE: Single contrast examination was performed using  thin barium. FLUOROSCOPY TIME:  Fluoroscopy Time:  2 minutes, 42 seconds Radiation Exposure Index (if provided by the fluoroscopic device): 31.2 mGy Number of Acquired Spot Images: 0 COMPARISON:  CT chest 08/19/2021 FINDINGS: Aortic knob impression on the esophagus accounts for the curvature on image 44 series 2. Lack of aeration of the left hemithorax compatible with known left pneumonectomy. No esophageal stricture observed. Primary peristaltic waves in the esophagus were intact. However, episodes of gastroesophageal reflux were identified. A 13 mm barium tablet transiently impacted at the gastroesophageal junction but subsequently reflux back into the esophagus. This settled back near the gastroesophageal junction but fail to proceed into the stomach over a total five-minute observation time despite the lack of obvious regional stricture. IMPRESSION: 1. Although no specific stricture or narrowing is identified at the  gastroesophageal junction, there is impaction of the barium pill in this vicinity with failure to proceed into the stomach. Significance uncertain, sometimes this is simply incidental but this does raise the possibility of a small occult narrowing at the GE junction. 2. Gastroesophageal reflux was observed. 3. Left pneumonectomy. Electronically Signed   By: Van Clines M.D.   On: 08/25/2021 09:28    Scheduled Meds:  aspirin  81 mg Oral Daily   enoxaparin (LOVENOX) injection  40 mg Subcutaneous Q24H   [START ON 08/27/2021] influenza vaccine adjuvanted  0.5 mL Intramuscular Tomorrow-1000   mouth rinse  15 mL Mouth Rinse BID   midodrine  10 mg Oral TID WC   pantoprazole  40 mg Oral Daily   simvastatin  20 mg Oral QHS   sodium chloride flush  3 mL Intravenous Q12H   [START ON 08/27/2021] umeclidinium bromide  1 puff Inhalation Daily   Continuous Infusions:  sodium chloride      LOS: 6 days   Kerney Elbe, DO [facility] PAGER is on AMION  If 7PM-7AM, please contact night-coverage [email or web address]

## 2021-08-26 NOTE — Anesthesia Postprocedure Evaluation (Signed)
Anesthesia Post Note  Patient: [name] [name]  Procedure(s) Performed: ESOPHAGOGASTRODUODENOSCOPY (EGD) WITH PROPOFOL Balloon dilation wire-guided     Anesthesia Post Evaluation No notable events documented.  Last Vitals:  Vitals:   08/26/21 1200 08/26/21 1209  BP: (!) 106/57 110/61  Pulse: 79 78  Resp: 17 19  Temp:    SpO2: 100% 100%    Last Pain:  Vitals:   08/26/21 1209  TempSrc:   PainSc: 0-No pain                 Barnet Glasgow

## 2021-08-26 NOTE — Op Note (Signed)
[facility] Patient Name: [name] [name] Procedure Date : 08/26/2021 MRN: [id number] Attending MD: Jackquline Denmark , MD Date of Birth: June 07, 1951 CSN: [id number] Age: 70 Admit Type: Inpatient Procedure:                Upper GI endoscopy Indications:              Dysphagia with abn Ba swallow Providers:                Jackquline Denmark, MD, Particia Nearing, RN, Tyna Jaksch                            Technician Referring MD:              Medicines:                Monitored Anesthesia Care Complications:            No immediate complications. Estimated Blood Loss:     Estimated blood loss: none. Procedure:                Pre-Anesthesia Assessment:                           - Prior to the procedure, a History and Physical                            was performed, and patient medications and                            allergies were reviewed. The patient's tolerance of                            previous anesthesia was also reviewed. The risks                            and benefits of the procedure and the sedation                            options and risks were discussed with the patient.                            All questions were answered, and informed consent                            was obtained. Prior Anticoagulants: The patient has                            taken no previous anticoagulant or antiplatelet                            agents. ASA Grade Assessment: III - A patient with                            severe systemic disease. After reviewing the risks  and benefits, the patient was deemed in                            satisfactory condition to undergo the procedure.                           After obtaining informed consent, the endoscope was                            passed under direct vision. Throughout the                            procedure, the patient's blood pressure, pulse, and                            oxygen saturations were  monitored continuously. The                            [id number]-H190 ([id number]) Olympus endoscope was introduced                            through the mouth, and advanced to the second part                            of duodenum. The upper GI endoscopy was                            accomplished without difficulty. The patient                            tolerated the procedure well. Scope In: Scope Out: Findings:      The lower third of the esophagus was mildly tortuous. Z-line was       well-defined at 38 cm, examined by NBI. The lower esophagus did distend       well. No hiatal hernias. Since patient had abn barium swallow, A TTS       dilator was passed through the scope. Dilation with a 15-16.5-18 mm       balloon dilator was performed to 16.5 mm. The scope was withdrawn.       Dilation was performed with a Maloney dilator with mild resistance at 50       Fr.      The entire examined stomach was normal. The retroflexed examination of       the cardia revealed GE junction flap to be Hill's Gd 1      The examined duodenum was normal. Impression:               - Mild presbyesophagus s/p esophageal dilatation.                           - Otherwise normal EGD.                           - No specimens collected. Recommendation:           - Return patient to hospital ward for ongoing care.                           -  Soft diet today. Then advance as tolerated.                           - Continue present medications including PPIs.                           - Obtained US abdo in AM for epi pain.                           - The findings and recommendations were discussed                            with the patient's family. Procedure Code(s):        --- Professional ---                           [phone], Esophagogastroduodenoscopy, flexible,                            transoral; with transendoscopic balloon dilation of                            esophagus (less than 30 mm diameter) Diagnosis  Code(s):        --- Professional ---                           Q39.9, Congenital malformation of esophagus,                            unspecified                           R13.10, Dysphagia, unspecified CPT copyright 2019 [employer]. All rights reserved. The codes documented in this report are preliminary and upon coder review may  be revised to meet current compliance requirements. Jackquline Denmark, MD 08/26/2021 11:51:38 AM This report has been signed electronically. Number of Addenda: 0

## 2021-08-27 ENCOUNTER — Inpatient Hospital Stay (HOSPITAL_COMMUNITY): Payer: Medicare Other

## 2021-08-27 ENCOUNTER — Encounter (HOSPITAL_COMMUNITY): Payer: Self-pay | Admitting: Gastroenterology

## 2021-08-27 LAB — CBC WITH DIFFERENTIAL/PLATELET
Abs Immature Granulocytes: 0.01 10*3/uL (ref 0.00–0.07)
Basophils Absolute: 0 10*3/uL (ref 0.0–0.1)
Basophils Relative: 1 %
Eosinophils Absolute: 0.1 10*3/uL (ref 0.0–0.5)
Eosinophils Relative: 2 %
HCT: 31.6 % — ABNORMAL LOW (ref 39.0–52.0)
Hemoglobin: 9.8 g/dL — ABNORMAL LOW (ref 13.0–17.0)
Immature Granulocytes: 0 %
Lymphocytes Relative: 18 %
Lymphs Abs: 0.8 10*3/uL (ref 0.7–4.0)
MCH: 28.6 pg (ref 26.0–34.0)
MCHC: 31 g/dL (ref 30.0–36.0)
MCV: 92.1 fL (ref 80.0–100.0)
Monocytes Absolute: 0.4 10*3/uL (ref 0.1–1.0)
Monocytes Relative: 9 %
Neutro Abs: 3.1 10*3/uL (ref 1.7–7.7)
Neutrophils Relative %: 70 %
Platelets: 159 10*3/uL (ref 150–400)
RBC: 3.43 MIL/uL — ABNORMAL LOW (ref 4.22–5.81)
RDW: 12.7 % (ref 11.5–15.5)
WBC: 4.4 10*3/uL (ref 4.0–10.5)
nRBC: 0 % (ref 0.0–0.2)

## 2021-08-27 LAB — COMPREHENSIVE METABOLIC PANEL
ALT: 16 U/L (ref 0–44)
AST: 20 U/L (ref 15–41)
Albumin: 3 g/dL — ABNORMAL LOW (ref 3.5–5.0)
Alkaline Phosphatase: 39 U/L (ref 38–126)
Anion gap: 3 — ABNORMAL LOW (ref 5–15)
BUN: 13 mg/dL (ref 8–23)
CO2: 40 mmol/L — ABNORMAL HIGH (ref 22–32)
Calcium: 8.7 mg/dL — ABNORMAL LOW (ref 8.9–10.3)
Chloride: 94 mmol/L — ABNORMAL LOW (ref 98–111)
Creatinine, Ser: 0.98 mg/dL (ref 0.61–1.24)
GFR, Estimated: >60 mL/min (ref >60)
Glucose, Bld: 82 mg/dL (ref 70–99)
Potassium: 4.3 mmol/L (ref 3.5–5.1)
Sodium: 137 mmol/L (ref 135–145)
Total Bilirubin: 0.4 mg/dL (ref 0.3–1.2)
Total Protein: 5.5 g/dL — ABNORMAL LOW (ref 6.5–8.1)

## 2021-08-27 LAB — PHOSPHORUS: Phosphorus: 3.7 mg/dL (ref 2.5–4.6)

## 2021-08-27 LAB — OCCULT BLOOD X 1 CARD TO LAB, STOOL: Fecal Occult Bld: NEGATIVE

## 2021-08-27 LAB — GLUCOSE, CAPILLARY
Glucose-Capillary: 80 mg/dL (ref 70–99)
Glucose-Capillary: 88 mg/dL (ref 70–99)
Glucose-Capillary: 100 mg/dL — ABNORMAL HIGH (ref 70–99)

## 2021-08-27 LAB — MAGNESIUM: Magnesium: 1.7 mg/dL (ref 1.7–2.4)

## 2021-08-27 MED ORDER — MAGNESIUM SULFATE 2 GM/50ML IV SOLN
2.0000 g | Freq: Once | INTRAVENOUS | Status: AC
  Administered 2021-08-27: 2 g via INTRAVENOUS
  Filled 2021-08-27: qty 50

## 2021-08-27 NOTE — Evaluation (Signed)
Physical Therapy Evaluation Patient Details Name: [name] [name] MRN: [id number] DOB: [birth year] Today's Date: 08/27/2021   History of Present Illness  70 yo male presents to [facility] 9/1 with SOB, LE edema, bloating. CT chest shows L large pleural effusion, s/p thoracentesis x2 on 9/2 and 9/6, first yielding 900 cc and second yileding 500 cc. Required transfer to ICU for shock, now resolved. Current work up for acute on chronic hypercapnic respiratory failure in setting of emphysema and history of SCC, pulmonary hypertension/R HF. s/p RHC 9/7. 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   Clinical Impression  Pt presents with min weakness, decreased activity tolerance vs baseline, impaired knowledge and application of breathing technique during mobility. Pt to benefit from acute PT to address deficits. Pt ambulated hallway distance with supervision assist, cues for breathing technique during mobility. PT expects pt to progress well. PT to progress mobility as tolerated, and will continue to follow acutely.   SATURATION QUALIFICATIONS: (This note is used to comply with regulatory documentation for home oxygen)  Patient Saturations on Room Air at Rest = 98%  Patient Saturations on Room Air while Ambulating = 81%  Patient Saturations on 2 Liters of oxygen while Ambulating = 91%  Please briefly explain why patient needs home oxygen: to maintain SPO2 >88%     Follow Up Recommendations No PT follow up;Supervision for mobility/OOB    Equipment Recommendations  None recommended by PT    Recommendations for Other Services       Precautions / Restrictions Precautions Precautions: Fall Precaution Comments: monitor O2 Restrictions Weight Bearing Restrictions: No      Mobility  Bed Mobility               General bed mobility comments: up in chair upon PT arrival to room.     Transfers Overall transfer level: Needs assistance Equipment used: None Transfers: Sit to/from Stand Sit to Stand: Supervision         General transfer comment: for safety, PT watching lines/leads. Increased time to rise/sit.  Ambulation/Gait Ambulation/Gait assistance: Supervision Gait Distance (Feet): 120 Feet (300) Assistive device: None Gait Pattern/deviations: Step-through pattern;Decreased stride length Gait velocity: WFL   General Gait Details: supervision for safety, verbal cuing for pursed lip breathing technique, pt x1 standing rest break braced against wall to recover dyspnea and SPO2 81% on RA. Increased O2 to 2LO2  Stairs            Wheelchair Mobility    Modified Rankin (Stroke Patients Only)       Balance Overall balance assessment: Mild deficits observed, not formally tested                                           Pertinent Vitals/Pain Pain Assessment: No/denies pain    Home Living Family/patient expects to be discharged to:: Private residence Living Arrangements: Other (Comment);Alone (granddaughter was living with him, but now living alone) Available Help at Discharge: Available PRN/intermittently;Family (daughter works in [place], pt lives in [place]) Type of Home: [place] Access: Stairs to enter Entrance Stairs-Rails: Right Technical brewer of Steps: 2-3 Home Layout: One level [place]: None      Prior Function Level of Independence: Independent               Hand Dominance  Dominant Hand: Right    Extremity/Trunk Assessment   Upper Extremity Assessment Upper Extremity Assessment: Defer to OT evaluation    Lower Extremity Assessment Lower Extremity Assessment: Generalized weakness    Cervical / Trunk Assessment Cervical / Trunk Assessment: Normal  Communication   Communication: No difficulties  Cognition Arousal/Alertness: Awake/alert Behavior During Therapy: WFL for tasks  assessed/performed Overall Cognitive Status: Within Functional Limits for tasks assessed                                        General Comments General comments (skin integrity, edema, etc.): trialled RA during gait, SpO2 min 81% requiring 2LO2    Exercises     Assessment/Plan    PT Assessment Patient needs continued PT services  PT Problem List Decreased strength;Decreased mobility;Decreased safety awareness;Decreased activity tolerance;Decreased balance;Decreased knowledge of use of DME;Pain;Cardiopulmonary status limiting activity       PT Treatment Interventions DME instruction;Therapeutic activities;Therapeutic exercise;Gait training;Patient/family education;Balance training;Neuromuscular re-education;Functional mobility training;Stair training    PT Goals (Current goals can be found in the Care Plan section)  Acute Rehab PT Goals Patient Stated Goal: to go home PT Goal Formulation: With patient Time For Goal Achievement: 09/10/21 Potential to Achieve Goals: Good    Frequency Min 3X/week   Barriers to discharge        Co-evaluation               AM-PAC PT "6 Clicks" Mobility  Outcome Measure Help needed turning from your back to your side while in a flat bed without using bedrails?: None Help needed moving from lying on your back to sitting on the side of a flat bed without using bedrails?: None Help needed moving to and from a bed to a chair (including a wheelchair)?: A Little Help needed standing up from a chair using your arms (e.g., wheelchair or bedside chair)?: A Little Help needed to walk in hospital room?: A Little Help needed climbing 3-5 steps with a railing? : A Little 6 Click Score: 20    End of Session Equipment Utilized During Treatment: Oxygen Activity Tolerance: Patient tolerated treatment well Patient left: in chair;with call bell/phone within reach (pt up in chair upon PT arrival without chair alarm, per pt report mobilizes  around room with RN permission) Nurse Communication: Mobility status PT Visit Diagnosis: Other abnormalities of gait and mobility (R26.89);Muscle weakness (generalized) (M62.81)    Time: 3748-2707 PT Time Calculation (min) (ACUTE ONLY): 24 min   Charges:   PT Evaluation $PT Eval Low Complexity: 1 Low PT Treatments $Gait Training: 8-22 mins       Stacie Glaze, PT DPT Acute Rehabilitation Services Pager [phone]  Office [phone]   Creola E Ruffin Pyo 08/27/2021, 1:25 PM

## 2021-08-27 NOTE — Progress Notes (Signed)
PROGRESS [name] [name] [name] [name]  GYI:[id number] DOB: January 12, 1951 DOA: 08/20/2021 PCP: Patient, No Pcp Per (Inactive)   Brief Narrative:  The patient is a 70 year old AAM who was admitted by the hospitalist service and subsequently transferred to the critical care service for evaluation management of left-sided pleural effusion and empyema.  He initially presented to [facility] ED on 08/18/2021 with complaints of shortness of breath, lower extremity edema and abdominal bloating.  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  He endorsed several months of progressive dyspnea on exertion with increasing left lower extremity edema over the past 2 weeks.  He presented ED and ED work-up was notable for being hypoxic on room air with a sodium of 126 and a BNP of 189.  CT of the chest demonstrated a large left-sided pleural effusion.  Pulmonary was consulted and 08/19/2021 and CT recommended CT surgery consult.  IR completed a thoracentesis with 900 mL of thick consistency pleural fluid removed with a thick calcified rind encasing fluid.  He was transferred to the ICU at [facility] given his shock and also for evaluation by cardiothoracic surgery.  His shock is now resolved and he is transferred back to the [facility] service on 08/25/2021.  Currently is being admitted and treated for acute on chronic hypercapnic respiratory failure in the setting of severe emphysema and history of squamous cell carcinoma with status post left pneumonectomy. Because of his Dysphagia GI was consulted and he underwent an EGD yesterday and had an abdominal ultrasound today.  On ambulation patient desaturated to 81% still require supplemental oxygen at home.  Pulmonary is qualify  him for trilogy vent at home.  Upper quadrant ultrasound showed no cholelithiasis but did show a simple cyst of the right liver that was 1 cm x 1 cm x 1.2 cm.  Assessment & Plan:   Active Problems:   Pleural effusion   Shock ([place])   Acute respiratory failure with hypoxia and hypercapnia (HCC)   Hyponatremia  Acute on chronic respiratory failure with hypoxia and hypercapnia in the setting of pleural effusion and COPD -Admitted and then transferred to the ICU and now is out of ICU -Patient has severe emphysema and has a history of squamous cell carcinoma status post left pneumonectomy in 2005 -Pulmonary has been consulted recommending continue BiPAP as needed and nightly -He is status postthoracentesis x2 -ABG    Component Value Date/Time   PHART 7.378 08/23/2021 0920   PCO2ART 76.1 (HH) 08/23/2021 0920   PO2ART 156 (H) 08/23/2021 0920   HCO3 40.9 (H) 08/24/2021 1234   HCO3 42.4 (H) 08/24/2021 1234   TCO2 43 (H) 08/24/2021 1234   TCO2 45 (H) 08/24/2021 1234   O2SAT 68.0 08/24/2021 1234   O2SAT 66.0 08/24/2021 1234    -They are considering obtaining a morning ABG to improve nocturnal hypercapnia and the need for NIV at home -They are recommending continue supportive care -Currently not on any more breathing treatments but will add Xopenex and Atrovent as needed -Continue with pulm toileting -Pulmonary is arranging Trilogy Vent prior to D/C and planning of outpatient Sleep Study and outpatient PFTs  -Ambulatory home O2 screen done showed the patient will require supplemental oxygen via nasal cannula  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  -  In the setting of COPD -Echocardiogram was consistent with moderate pulmonary hypertension with very mild right ventricular strain -Underwent a right heart cath to quantify pulm hypertension and evaluate his volume status -Cardiology recommending appropriate oxygenation optimizing volume status -Currently not volume  overloaded on exam still  Hypotension in the setting of shock, improved -Initially required Levophed but now resolved after midodrine we will continue midodrine -There is also initial concern for septic shock and received IV Zosyn x2 but fluid cultures been negative in antibiotics have been discontinued -Continuing low-dose midodrine 5 mg 3 times daily -Continue monitor blood pressures per protocol as last blood pressure reading was 100/71  Hyperlipidemia -Continue statin  Lower Extremity Edema -Improved and he was started on Lasix as an outpatient -Cardiology recommending holding Lasix given recent hypotension and that because he does not appear clinically volume overloaded -Continue monitor for lower extremity edema and he had very mild  COPD -Continue supportive care as above and currently does not appear to be exacerbated  Hypertension but currently was hypotensive so we will hold his medications -He has been on Coreg, lisinopril/HCTZ and Lasix in the outpatient setting but has been held -Continue to Monitor BP per Protocol -Last BP reading was 100/71 Hyponatremia -Mild.  -Na+ went from 135 -> 137 -> 134 -> 136 and today is 137 -Continue to Monitor and Trend -Repeat CMP in the AM   Normocytic Anemia  -Hemoglobin/hematocrit dropped from admission and trended all the way down and has slowly trended down and went from 10.8/34.9 -> 10.7/34.3 -> 9.5/30.9 and trended up to 9.8/31.6 -Anemia Panel showed an iron level of 94, U IBC 389, TIBC 43, saturation ratios of 19%, ferritin of 72, folate of 15.4, vitamin B12 482 -Continue to Monitor for S/Sx of Bleeding; No overt Bleeding noted -Repeat CBC in the AM  Unintentional weight loss and dysphagia -He is about the same weight as 18 months ago at his oncology visit -Had a prior work-up back in 2022 with a CT abdomen pelvis was negative for structural issues -Had SLP on 9 4 and an esophagram on 9 8 which showed no specific stricture but  barium pill did not pass of the stomach raising question of GE junction narrowing -SLP following and they are recommending dysphagia diet -GI will evaluate the patient for further evaluation for possible EGD and dilation -Patient underwent EGD today and will be getting a Abodminal U/S in the AM -EGD showed "The lower third of the esophagus was mildly tortuous. Z-line was well-defined at 38 cm, examined by NBI. The lower esophagus did distend well. No hiatal hernias. Since patient had abn barium swallow, A TTS dilator was passed through the scope. Dilation with a 15-16.5-18 mm balloon dilator was performed to 16.5 mm. The scope was withdrawn. Dilation was performed with a Maloney dilator with mild resistance at 50 Fr. The entire examined stomach was normal. The retroflexed examination of the cardia revealed GE junction flap to be Hill's Gd 1 The examined duodenum was normal." Overall impression was "Mild presbyesophagus s/p esophageal dilatation. Otherwise normal EGD." -GI recommending Soft Diet and continuing PPI 40 mg po Daily and obtaining U/S of Abdomen which was negative for cholelithiasis but did show a small liver simple cyst L is 1 x 1 x 1.2 cm  DVT prophylaxis: Enoxaparin 40 mg subcu q. 24 Code Status: FULL CODE  Family Communication: No family present at bedside Disposition Plan: Pending further clinical improvement and evaluation by PT OT; patient will need oxygen for  discharge Status is: Inpatient  Remains inpatient appropriate because:Unsafe d/c plan, IV treatments appropriate due to intensity of illness or inability to take PO, and Inpatient level of care appropriate due to severity of illness  Dispo: The patient is from: Home              Anticipated d/c is to:  TBD              Patient currently is not medically stable to d/c.   Difficult to place patient No  Consultants:  Cardiovascular surgery PCCM Cardiology  Procedures:  9/2 Thora with 900cc removed, new levophed  requirement, transfer to [facility] 9/6 Thora with 500cc removed  RHC 9/7 with mild to mod PAH, normal left sided pressures.    EGD Findings:      The lower third of the esophagus was mildly tortuous. Z-line was       well-defined at 38 cm, examined by NBI. The lower esophagus did distend       well. No hiatal hernias. Since patient had abn barium swallow, A TTS       dilator was passed through the scope. Dilation with a 15-16.5-18 mm       balloon dilator was performed to 16.5 mm. The scope was withdrawn.       Dilation was performed with a Maloney dilator with mild resistance at 50       Fr.      The entire examined stomach was normal. The retroflexed examination of       the cardia revealed GE junction flap to be Hill's Gd 1      The examined duodenum was normal. Impression:               - Mild presbyesophagus s/p esophageal dilatation.                           - Otherwise normal EGD.                           - No specimens collected. Recommendation:           - Return patient to hospital ward for ongoing care.                           - Soft diet today. Then advance as tolerated.                           - Continue present medications including PPIs.                           - Obtained US abdo in AM for epi pain.                           - The findings and recommendations were discussed                            with the patient's family.  Antimicrobials:  Anti-infectives (From admission, onward)    Start     Dose/Rate Route Frequency Ordered Stop   08/20/21 0600  piperacillin-tazobactam (ZOSYN) IVPB 3.375 g  Status:  Discontinued        3.375 g 12.5 mL/hr over 240 Minutes  Intravenous Every 8 hours 08/20/21 0133 08/21/21 1050        Subjective: Seen and examined at bedside and thinks he is doing little bit better but still having some intermittent abdominal pain.  Wearing his NIV at night.  Ambulated and desaturated so will need home oxygen.  Objective: Vitals:    08/26/21 2125 08/27/21 0138 08/27/21 0507 08/27/21 0911  BP: (!) 149/79  100/71   Pulse: 89 72 77   Resp: _0 Temp: 98.2 F (36.8 C)  98.2 F (36.8 C)   TempSrc: Oral  Oral   SpO2: 100% 100% 100% 100%  Weight:   55.9 kg   Height:        Intake/Output Summary (Last 24 hours) at 08/27/2021 1621 Last data filed at 08/27/2021 1204 Gross per 24 hour  Intake 335 ml  Output 350 ml  Net -15 ml    Filed Weights   08/25/21 0649 08/26/21 0600 08/27/21 0507  Weight: 55.1 kg 55.1 kg 55.9 kg   Examination: Physical Exam:  Constitutional: Thin chronically ill-appearing African-American male currently in no acute distress appears calm  Eyes: Lids and conjunctivae normal, sclerae anicteric  ENMT: External Ears, Nose appear normal. Grossly normal hearing. Mucous membranes are moist.   Neck: Appears normal, supple, no cervical masses, normal ROM, no appreciable thyromegaly; no appreciable JVD Respiratory: Diminished to auscultation bilaterally, no wheezing, rales, rhonchi or crackles. Normal respiratory effort and patient is not tachypenic. No accessory muscle use.  Unlabored breathing Cardiovascular: Slightly tachycardic, no murmurs / rubs / gallops. S1 and S2 auscultated.  Very minimal extremity edema Abdomen: Soft, mildly-tender, non-distended. Bowel sounds positive.  GU: Deferred. Musculoskeletal: No clubbing / cyanosis of digits/nails. No joint deformity upper and lower extremities.  Skin: No rashes, lesions, ulcers. No induration; Warm and dry.  Neurologic: CN 2-12 grossly intact with no focal deficits.  Romberg sign and cerebellar reflexes not assessed  Psychiatric: Normal judgment and insight. Alert and oriented x 3. Normal mood and appropriate affect.    Data Reviewed: I have personally reviewed following labs and imaging studies  CBC: Recent Labs  Lab 08/21/21 0622 08/22/21 0500 08/23/21 0600 08/23/21 0920 08/24/21 0409 08/24/21 1234 08/25/21 0124 08/26/21 0221  08/27/21 0256  WBC 5.8   < > 4.8  --  4.5  --  4.6 4.3 4.4  NEUTROABS 4.3  --   --   --   --   --   --  2.8 3.1  HGB 11.1*   < > 10.2*   < > 10.8* 12.9*  12.6* 10.7* 9.5* 9.8*  HCT 35.8*   < > 33.2*   < > 34.9* 38.0*  37.0* 34.3* 30.9* 31.6*  MCV 91.3   < > 93.5  --  92.1  --  91.2 92.0 92.1  PLT 164   < > 141*  --  160  --  174 165 159   < > = values in this interval not displayed.    Basic Metabolic Panel: Recent Labs  Lab 08/23/21 0600 08/23/21 0920 08/24/21 0409 08/24/21 1234 08/25/21 0124 08/26/21 0221 08/27/21 0256  NA 135   < > 135 136  137 134* 136 137  K 4.1   < > 4.4 4.5  4.3 4.1 4.2 4.3  CL 91*  --  90*  --  92* 92* 94*  CO2 41*  --  39*  --  37* 37* 40*  GLUCOSE 83  --  87  --  87  86 82  BUN 12  --  12  --  _0 CREATININE 0.79  --  0.84  --  1.17 0.88 0.98  CALCIUM 8.8*  --  9.1  --  8.7* 9.1 8.7*  MG 1.7  --  2.1  --  1.9 1.8 1.7  PHOS  --   --   --   --  3.7 3.7 3.7   < > = values in this interval not displayed.    GFR: Estimated Creatinine Clearance: 57.8 mL/min (by C-G formula based on SCr of 0.98 mg/dL). Liver Function Tests: Recent Labs  Lab 08/21/21 0622 08/26/21 0221 08/27/21 0256  AST _1 ALT _2 ALKPHOS 41 37* 39  BILITOT 1.0 0.6 0.4  PROT 5.3* 5.3* 5.5*  ALBUMIN 2.8* 2.9* 3.0*    No results for input(s): LIPASE, AMYLASE in the last 168 hours. No results for input(s): AMMONIA in the last 168 hours. Coagulation Profile: No results for input(s): INR, PROTIME in the last 168 hours. Cardiac Enzymes: No results for input(s): CKTOTAL, CKMB, CKMBINDEX, TROPONINI in the last 168 hours. BNP (last 3 results) No results for input(s): PROBNP in the last 8760 hours. HbA1C: No results for input(s): HGBA1C in the last 72 hours.  CBG: Recent Labs  Lab 08/25/21 1632 08/26/21 1033 08/26/21 1112 08/27/21 0801 08/27/21 1155  GLUCAP 68* 66* 87 80 88    Lipid Profile: No results for input(s): CHOL, HDL, LDLCALC, TRIG,  CHOLHDL, LDLDIRECT in the last 72 hours. Thyroid Function Tests: No results for input(s): TSH, T4TOTAL, FREET4, T3FREE, THYROIDAB in the last 72 hours. Anemia Panel: Recent Labs    08/26/21 1243  VITAMINB12 482  FOLATE 15.4  FERRITIN 72  TIBC 483*  IRON 94  RETICCTPCT 1.0    Sepsis Labs: No results for input(s): PROCALCITON, LATICACIDVEN in the last 168 hours.  Recent Results (from the past 240 hour(s))  Resp Panel by RT-PCR (Flu A&B, Covid) Nasopharyngeal Swab     Status: None   Collection Time: 08/18/21  7:41 PM   Specimen: Nasopharyngeal Swab; Nasopharyngeal(NP) swabs in vial transport medium  Result Value Ref Range Status   SARS Coronavirus 2 by RT PCR NEGATIVE NEGATIVE Final    Comment: (NOTE) SARS-CoV-2 target nucleic acids are NOT DETECTED.  The SARS-CoV-2 RNA is generally detectable in upper respiratory specimens during the acute phase of infection. The lowest concentration of SARS-CoV-2 viral copies this assay can detect is 138 copies/mL. A negative result does not preclude SARS-Cov-2 infection and should not be used as the sole basis for treatment or other patient management decisions. A negative result may occur with  improper specimen collection/handling, submission of specimen other than nasopharyngeal swab, presence of viral mutation(s) within the areas targeted by this assay, and inadequate number of viral copies(<138 copies/mL). A negative result must be combined with clinical observations, patient history, and epidemiological information. The expected result is Negative.  Fact Sheet for Patients:  EntrepreneurPulse.com.au  Fact Sheet for Healthcare Providers:  IncredibleEmployment.be  This test is no t yet approved or cleared by the [place] FDA and  has been authorized for detection and/or diagnosis of SARS-CoV-2 by FDA under an Emergency Use Authorization (EUA). This EUA will remain  in effect (meaning this  test can be used) for the duration of the COVID-19 declaration under Section 564(b)(1) of the Act, 21 U.S.C.section 360bbb-3(b)(1), unless the authorization is terminated  or revoked sooner.       Influenza  A by PCR NEGATIVE NEGATIVE Final   Influenza B by PCR NEGATIVE NEGATIVE Final    Comment: (NOTE) The Xpert Xpress SARS-CoV-2/FLU/RSV plus assay is intended as an aid in the diagnosis of influenza from Nasopharyngeal swab specimens and should not be used as a sole basis for treatment. Nasal washings and aspirates are unacceptable for Xpert Xpress SARS-CoV-2/FLU/RSV testing.  Fact Sheet for Patients: EntrepreneurPulse.com.au  Fact Sheet for Healthcare Providers: IncredibleEmployment.be  This test is not yet approved or cleared by the [place] FDA and has been authorized for detection and/or diagnosis of SARS-CoV-2 by FDA under an Emergency Use Authorization (EUA). This EUA will remain in effect (meaning this test can be used) for the duration of the COVID-19 declaration under Section 564(b)(1) of the Act, 21 U.S.C. section 360bbb-3(b)(1), unless the authorization is terminated or revoked.  Performed at [facility], [address] [place], [place] [address]   Body fluid culture w Gram Stain     Status: None   Collection Time: 08/19/21 12:20 PM   Specimen: PATH Cytology Pleural fluid  Result Value Ref Range Status   Specimen Description   Final    PLEURAL Performed at [facility], [address], [place], [place] [address]    Special Requests   Final    NONE Performed at [facility], [address] [place], [place] [address]    Gram Stain NO WBC SEEN NO ORGANISMS SEEN   Final   Culture   Final    NO GROWTH 3 DAYS Performed at [facility], [place] [address] [place], [place] [address]    Report Status 08/23/2021 FINAL  Final  Fungus Culture With Stain     Status: None (Preliminary result)    Collection Time: 08/19/21 12:20 PM   Specimen: Pleural, Left  Result Value Ref Range Status   Fungus Stain Final report  Final    Comment: (NOTE) Performed At: [facility] [address] [place], [place] [id number] Rush Farmer MD [id number]    Fungus (Mycology) Culture PENDING  Incomplete   Fungal Source PLEURAL  Final    Comment: Performed at [facility], [address] [place], [place] [address]  Fungus Culture Result     Status: None   Collection Time: 08/19/21 12:20 PM  Result Value Ref Range Status   Result 1 Comment  Final    Comment: (NOTE) KOH/Calcofluor preparation:  no fungus observed. Performed At: [facility] [place], [place] [id number] Rush Farmer MD [id number]   CULTURE, BLOOD (ROUTINE X 2) w Reflex to ID Panel     Status: None   Collection Time: 08/19/21  3:29 PM   Specimen: BLOOD  Result Value Ref Range Status   Specimen Description BLOOD BLOOD LEFT FOREARM  Final   Special Requests   Final    BOTTLES DRAWN AEROBIC AND ANAEROBIC Blood Culture adequate volume   Culture   Final    NO GROWTH 5 DAYS Performed at [facility], [address] [place], [place] [address]    Report Status 08/24/2021 FINAL  Final  CULTURE, BLOOD (ROUTINE X 2) w Reflex to ID Panel     Status: None   Collection Time: 08/19/21  3:42 PM   Specimen: BLOOD  Result Value Ref Range Status   Specimen Description BLOOD BLOOD RIGHT FOREARM  Final   Special Requests   Final    BOTTLES DRAWN AEROBIC AND ANAEROBIC Blood Culture results may not be optimal  due to an inadequate volume of blood received in culture bottles   Culture   Final    NO GROWTH 5 DAYS Performed at [facility], [address] [place], [place] [address]    Report Status 08/24/2021 FINAL  Final  MRSA Next Gen by PCR, Nasal     Status: None   Collection Time: 08/20/21 12:45 AM  Result Value Ref Range Status   MRSA by PCR Next Gen NOT DETECTED  NOT DETECTED Final    Comment: (NOTE) The GeneXpert MRSA Assay (FDA approved for NASAL specimens only), is one component of a comprehensive MRSA colonization surveillance program. It is not intended to diagnose MRSA infection nor to guide or monitor treatment for MRSA infections. Test performance is not FDA approved in patients less than 85 years old. Performed at [facility], [place] [address] [place], [place] [address]      RN Pressure Injury Documentation:     Estimated body mass index is 18.21 kg/m as calculated from the following:   Height as of this encounter: 5' 9" (1.753 m).   Weight as of this encounter: 55.9 kg.  Malnutrition Type:    Malnutrition Characteristics:   Nutrition Interventions:     Radiology Studies: DG CHEST PORT 1 VIEW  Result Date: 08/26/2021 CLINICAL DATA:  Shortness of breath.  History of left pneumonectomy. EXAM: PORTABLE CHEST 1 VIEW COMPARISON:  08/22/2021 FINDINGS: Complete opacification of the left hemithorax with pleural based calcifications. These findings are unchanged. Right lung is clear. Heart and mediastinum are obscured by the opacification in the left hemithorax. IMPRESSION: 1. No acute disease. 2. Stable changes from left pneumonectomy. Electronically Signed   By: Markus Daft M.D.   On: 08/26/2021 08:52   US Abdomen Limited RUQ (LIVER/GB)  Result Date: 08/27/2021 CLINICAL DATA:  70 year old male with a history epigastric pain EXAM: ULTRASOUND ABDOMEN LIMITED RIGHT UPPER QUADRANT COMPARISON:  None. FINDINGS: Gallbladder: No gallstones or wall thickening visualized. No sonographic Murphy sign noted by sonographer. Common bile duct: Diameter: 2 mm-3 mm Liver: Anechoic rounded lesion within the anterior right liver, 1 cm x 1 cm x 1.2 cm, with through transmission and no internal complexity. Within normal limits in parenchymal echogenicity. Portal vein is patent on color Doppler imaging with normal direction of blood flow towards the liver.  Other: None. IMPRESSION: Negative four cholelithiasis. Simple cyst of the right liver. Electronically Signed   By: Corrie Mckusick D.O.   On: 08/27/2021 10:51    Scheduled Meds:  aspirin  81 mg Oral Daily   enoxaparin (LOVENOX) injection  40 mg Subcutaneous Q24H   influenza vaccine adjuvanted  0.5 mL Intramuscular Tomorrow-1000   mouth rinse  15 mL Mouth Rinse BID   midodrine  10 mg Oral TID WC   pantoprazole  40 mg Oral Daily   simvastatin  20 mg Oral QHS   sodium chloride flush  3 mL Intravenous Q12H   umeclidinium bromide  1 puff Inhalation Daily   Continuous Infusions:  sodium chloride      LOS: 7 days   Kerney Elbe, DO [facility] PAGER is on AMION  If 7PM-7AM, please contact night-coverage [email or web address]

## 2021-08-28 ENCOUNTER — Inpatient Hospital Stay (HOSPITAL_COMMUNITY): Payer: Medicare Other

## 2021-08-28 LAB — GLUCOSE, CAPILLARY
Glucose-Capillary: 78 mg/dL (ref 70–99)
Glucose-Capillary: 149 mg/dL — ABNORMAL HIGH (ref 70–99)
Glucose-Capillary: 121 mg/dL — ABNORMAL HIGH (ref 70–99)
Glucose-Capillary: 73 mg/dL (ref 70–99)

## 2021-08-28 LAB — CBC WITH DIFFERENTIAL/PLATELET
Abs Immature Granulocytes: 0.02 10*3/uL (ref 0.00–0.07)
Basophils Absolute: 0 10*3/uL (ref 0.0–0.1)
Basophils Relative: 1 %
Eosinophils Absolute: 0.1 10*3/uL (ref 0.0–0.5)
Eosinophils Relative: 3 %
HCT: 31.1 % — ABNORMAL LOW (ref 39.0–52.0)
Hemoglobin: 9.4 g/dL — ABNORMAL LOW (ref 13.0–17.0)
Immature Granulocytes: 0 %
Lymphocytes Relative: 19 %
Lymphs Abs: 0.9 10*3/uL (ref 0.7–4.0)
MCH: 28.1 pg (ref 26.0–34.0)
MCHC: 30.2 g/dL (ref 30.0–36.0)
MCV: 93.1 fL (ref 80.0–100.0)
Monocytes Absolute: 0.5 10*3/uL (ref 0.1–1.0)
Monocytes Relative: 10 %
Neutro Abs: 3.2 10*3/uL (ref 1.7–7.7)
Neutrophils Relative %: 67 %
Platelets: 156 10*3/uL (ref 150–400)
RBC: 3.34 MIL/uL — ABNORMAL LOW (ref 4.22–5.81)
RDW: 12.7 % (ref 11.5–15.5)
WBC: 4.8 10*3/uL (ref 4.0–10.5)
nRBC: 0 % (ref 0.0–0.2)

## 2021-08-28 LAB — COMPREHENSIVE METABOLIC PANEL
ALT: 16 U/L (ref 0–44)
AST: 19 U/L (ref 15–41)
Albumin: 2.9 g/dL — ABNORMAL LOW (ref 3.5–5.0)
Alkaline Phosphatase: 40 U/L (ref 38–126)
Anion gap: 3 — ABNORMAL LOW (ref 5–15)
BUN: 14 mg/dL (ref 8–23)
CO2: 39 mmol/L — ABNORMAL HIGH (ref 22–32)
Calcium: 8.7 mg/dL — ABNORMAL LOW (ref 8.9–10.3)
Chloride: 95 mmol/L — ABNORMAL LOW (ref 98–111)
Creatinine, Ser: 0.94 mg/dL (ref 0.61–1.24)
GFR, Estimated: >60 mL/min (ref >60)
Glucose, Bld: 89 mg/dL (ref 70–99)
Potassium: 4.2 mmol/L (ref 3.5–5.1)
Sodium: 137 mmol/L (ref 135–145)
Total Bilirubin: 0.5 mg/dL (ref 0.3–1.2)
Total Protein: 5.3 g/dL — ABNORMAL LOW (ref 6.5–8.1)

## 2021-08-28 LAB — MAGNESIUM: Magnesium: 2 mg/dL (ref 1.7–2.4)

## 2021-08-28 LAB — PHOSPHORUS: Phosphorus: 3.1 mg/dL (ref 2.5–4.6)

## 2021-08-28 MED ORDER — SALINE SPRAY 0.65 % NA SOLN
1.0000 | NASAL | Status: DC | PRN
  Filled 2021-08-28: qty 44

## 2021-08-28 NOTE — Progress Notes (Signed)
NAMEAmaury [name], MRN:  [id number], DOB:  [birth year], LOS: 8 ADMISSION DATE:  08/20/2021, CONSULTATION DATE:   9/2 REFERRING MD:  [name], REASON FOR CONSULT:  Left sided pleural effusion/empyema  History of Present Illness:  [name] [name] is  70 y.o. M  who is seen in consultation at the request of Dr. [name] for recommendations on further evaluation and management of left sided pleural effusion/empyema. The patient presented to [facility] ED  on 9/1 with complaints of shortness of breath, lower extremity edema, and abdominal bloating. The patient was admitted to Hospitalist service.   He has a pertinent past medical history of squamous cell lung cancer (s/p pneumonectomy (2005) and chemo with adjuvant cisplatin/vinorelbine), HLD, HTN   On 9/1 he was seen by cardiology and sent to ED for diuresis after an ECHO was obtained that was concerning for pulmonary HTN and RV failure. He He endorsed several months of progressive dyspnea on exertion with increasing LLE edema over the past two weeks. He presented to the ED. Ed workup was notable for SPO2 of 83%, NA 126, BNP 189. CT chest demonstrated a large left sided pleural effusion.    On 9/2 Pulmonology was consulted and recommended CT surgery consult. IR completed a thoracentesis with 95m of thick consistency pleural fluid removed with a thick calcified rind encasing fluid. [facility] having [facility] new [facility]hed requirement and for a CTTS consult.    Pertinent  Medical History  squamous cell lung cancer (s/p pneumonectomy (2005) and chemo with adjuvant cisplatin/vinorelbine), HLD, HTN  Significant Hospital Events: Including procedures, antibiotic start and stop dates in addition to other pertinent events   [facility]    Interim History / Subjective:  Tolerated BiPAP  nightly   Objective   Blood pressure 103/68, pulse 70, temperature 98.4 F (36.9 C), temperature source Oral, resp. rate 19, height _0  (1.753 m), weight 55.8 kg, SpO2 98 %.    FiO2 (%):  [28 %] 28 %   Intake/Output Summary (Last 24 hours) at 08/28/2021 1018 Last data filed at 08/28/2021 0435 Gross per 24 hour  Intake 335 ml  Output 1250 ml  Net -915 ml   Filed Weights   08/26/21 0600 08/27/21 0507 08/28/21 0434  Weight: 55.1 kg 55.9 kg 55.8 kg    Physical Exam: General: Elderly-appearing, no acute distress HENT: [place], AT, OP clear, MMM Eyes: EOMI, no scleral icterus Respiratory: Clear to auscultation bilaterally.  No crackles, wheezing or rales Cardiovascular: RRR, -M/R/G, no JVD Extremities:-Edema,-tenderness Neuro: AAO x4, CNII-XII grossly intact Psych: Normal mood, normal affect  Resolved Hospital Problem list     Assessment & Plan:   Acute on chronic hypoxemic and hypercapneic respiratory failure secondary to ?pneumonectomy sequelae - will need arrange Trilogy vent prior to discharge. Plan for outpatient sleep study Acute right sided heart failure - improved Hx squamous cell lung cancer s/p left pneumonectomy in 2005 with chronic pleural effusion - s/p thoracentesis 9/2 (900cc thick pleural fluid), 9/6 for symptomatic relief and r/o infection. Severe emphysema - start Incruse. Plan for outpatient PFTs Mild-moderate pulmonary hypertension - not a candidate for selective pulmonary artery vasodilators Shock - resolved Unintentional weight loss, dysphagia - GI work-up per primary team. So far unrevealing with CT, RUQ [place] esophagram, swallow. 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 Family has inquired if he is a candidate for transplant. I would like to confirm his diagnosis and severity of COPD with outpatient PFTs and to rule in/out  OSA with sleep study first. He will also need to be discharged with NIV for his chronic hypercarbic respiratory failure to prevent future hospitalizations as he is high risk for respiratory failure post-hospitalization. He will also need to be referred to Pulmonary Rehab.  - Continue BiPAP as needed and QHS - Communicated with primary team to assist in arranging NIV prior to discharge - Follow-up with Pulmonary for outpatient PFTs, sleep study and Pulmonary rehab (referral placed to [facility])  Updated patient and daughter on plan  Pulmonary will sign off. Please call team for any concerns or questions  Rodman Pickle, M.D. [facility] Pulmonary/Critical Care Medicine 08/28/2021 10:57 AM   See Amion for personal pager For hours between 7 PM to 7 AM, please call Elink for urgent questions

## 2021-08-28 NOTE — Progress Notes (Signed)
PROGRESS [name] [name]  BMW:[id number] DOB: [birth year] DOA: 08/20/2021 PCP: Patient, No Pcp Per (Inactive)   Brief Narrative:  The patient is a 70 year old AAM who was admitted by the hospitalist service and subsequently transferred to the critical care service for evaluation management of left-sided pleural effusion and empyema.  He initially presented to [facility] ED on 08/18/2021 with complaints of shortness of breath, lower extremity edema and abdominal bloating.  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  He endorsed several months of progressive dyspnea on exertion with increasing left lower extremity edema over the past 2 weeks.  He presented ED and ED work-up was notable for being hypoxic on room air with a sodium of 126 and a BNP of 189.  CT of the chest demonstrated a large left-sided pleural effusion.  Pulmonary was consulted and 08/19/2021 and CT recommended CT surgery consult.  IR completed a thoracentesis with 900 mL of thick consistency pleural fluid removed with a thick calcified rind encasing fluid.  He was transferred to the ICU at [facility] given his shock and also for evaluation by cardiothoracic surgery.  His shock is now resolved and he is transferred back to the [facility] service on 08/25/2021.  Currently is being admitted and treated for acute on chronic hypercapnic respiratory failure in the setting of severe emphysema and history of squamous cell carcinoma with status post left pneumonectomy. Because of his Dysphagia GI was consulted and he underwent an EGD yesterday and had an abdominal ultrasound today.  On ambulation patient desaturated to 81% still require supplemental oxygen at home.  Pulmonary is qualify  him for trilogy vent at home and unfortunately paperwork was not submitted by TOC. Upper quadrant ultrasound showed no cholelithiasis but did show a simple cyst of the right liver that was 1 cm x 1 cm x 1.2 cm.  Assessment & Plan:   Active Problems:   Pleural effusion   Shock ([place])   Acute respiratory failure with hypoxia and hypercapnia (HCC)   Hyponatremia  Acute on chronic respiratory failure with hypoxia and hypercapnia in the setting of pleural effusion and COPD -Admitted and then transferred to the ICU and now is out of ICU -Patient has severe emphysema and has a history of squamous cell carcinoma status post left pneumonectomy in 2005 -Pulmonary has been consulted recommending continue BiPAP as needed and nightly -He is status postthoracentesis x2 -ABG    Component Value Date/Time   PHART 7.378 08/23/2021 0920   PCO2ART 76.1 (HH) 08/23/2021 0920   PO2ART 156 (H) 08/23/2021 0920   HCO3 40.9 (H) 08/24/2021 1234   HCO3 42.4 (H) 08/24/2021 1234   TCO2 43 (H) 08/24/2021 1234   TCO2 45 (H) 08/24/2021 1234   O2SAT 68.0 08/24/2021 1234   O2SAT 66.0 08/24/2021 1234    -They are considering obtaining a morning ABG to improve nocturnal hypercapnia and the need for NIV at home -They are recommending continue supportive care -Currently not on any more breathing treatments but will add Xopenex and Atrovent as needed -Continue with pulm toileting -Pulmonary is arranging Trilogy Vent prior to D/C and planning of outpatient Sleep Study and outpatient PFTs; Trilogy was for unclear reasons not set up by [facility] so will submit paperwork in the AM for  Ship broker  -Ambulatory home O2 screen done showed the patient will require supplemental oxygen via nasal cannula; DME O2 2 Liters ordered  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  -In the setting of COPD -Echocardiogram was consistent with moderate pulmonary hypertension with very mild right ventricular  strain -Underwent a right heart cath to quantify pulm hypertension and evaluate his volume status -Cardiology recommending appropriate oxygenation optimizing volume status -Patient is -3.215 Liters  -Currently not volume overloaded on exam still  Hypotension in the setting of shock, improved -Initially required Levophed but now resolved after midodrine we will continue midodrine -There is also initial concern for septic shock and received IV Zosyn x2 but fluid cultures been negative in antibiotics have been discontinued -Continuing low-dose midodrine 5 mg 3 times daily -Continue monitor blood pressures per protocol as last blood pressure reading was 126/76  Hyperlipidemia -Continue statin  Lower Extremity Edema -Improved and he was started on Lasix as an outpatient -Cardiology recommending holding Lasix given recent hypotension and that because he does not appear clinically volume overloaded -Continue monitor for lower extremity edema and he had very mild  COPD -Continue supportive care as above and currently does not appear to be exacerbated  Hypertension but currently was hypotensive so we will hold his medications -He has been on Coreg, lisinopril/HCTZ and Lasix in the outpatient setting but has been held -Continue to Monitor BP per Protocol -Last BP reading was 126/76  Hyponatremia -Mild.  -Na+ went from 135 -> 137 -> 134 -> 136 -> 137 x2 -Continue to Monitor and Trend -Repeat CMP in the AM   Normocytic Anemia  -Hemoglobin/hematocrit dropped from admission and trended all the way down and has slowly trended down and went from 10.8/34.9 -> 10.7/34.3 -> 9.5/30.9 and trended up to 9.8/31.6 yesterday and today is now 9.4/31.1 -Anemia Panel showed an iron level of 94, U IBC 389, TIBC 43, saturation ratios of 19%, ferritin of 72, folate of 15.4, vitamin B12 482 -Continue to Monitor for S/Sx of Bleeding; No overt Bleeding noted -Repeat CBC in the AM  Unintentional weight loss and  dysphagia -He is about the same weight as 18 months ago at his oncology visit -Had a prior work-up back in 2022 with a CT abdomen pelvis was negative for structural issues -Had SLP on 9 4 and an esophagram on 9 8 which showed no specific stricture but barium pill did not pass of the stomach raising question of GE junction narrowing -SLP following and they are recommending dysphagia diet -GI will evaluate the patient for further evaluation for possible EGD and dilation -Patient underwent EGD today and will be getting a Abodminal U/S in the AM -EGD showed "The lower third of the esophagus was mildly tortuous. Z-line was well-defined at 38 cm, examined by NBI. The lower esophagus did distend well. No hiatal hernias. Since patient had abn barium swallow, A TTS dilator was passed through the scope. Dilation with a 15-16.5-18 mm balloon dilator was performed to 16.5 mm. The scope was withdrawn. Dilation was performed with a Maloney dilator with mild resistance at 50 Fr. The entire examined stomach was normal. The retroflexed examination of the cardia revealed GE junction flap to be Hill's Gd 1 The examined duodenum was normal." Overall impression was "Mild presbyesophagus s/p esophageal dilatation. Otherwise normal EGD." -GI recommending Soft Diet and continuing PPI 40 mg po Daily and obtaining U/S of Abdomen which was negative for cholelithiasis but did show a small liver simple cyst L is  1 x 1 x 1.2 cm  DVT prophylaxis: Enoxaparin 40 mg subcu q. 24 Code Status: FULL CODE  Family Communication: No family present at bedside Disposition Plan: Pending further clinical improvement and evaluation by PT OT; patient will need oxygen for discharge and Trilogy Vent for D/C  Status is: Inpatient  Remains inpatient appropriate because:Unsafe d/c plan, IV treatments appropriate due to intensity of illness or inability to take PO, and Inpatient level of care appropriate due to severity of illness  Dispo: The  patient is from: Home              Anticipated d/c is to:  TBD              Patient currently is not medically stable to d/c.   Difficult to place patient No  Consultants:  Cardiovascular surgery PCCM Cardiology  Procedures:  9/2 Thora with 900cc removed, new levophed requirement, transfer to [facility] 9/6 Thora with 500cc removed  R[facility] 9/7 with mild to mod PAH, normal left sided pressures.    EGD Findings:      The lower third of the esophagus was mildly tortuous. Z-line was       well-defined at 38 cm, examined by NBI. The lower esophagus did distend       well. No hiatal hernias. Since patient had abn barium swallow, A TTS       dilator was passed through the scope. Dilation with a 15-16.5-18 mm       balloon dilator was performed to 16.5 mm. The scope was withdrawn.       Dilation was performed with a Maloney dilator with mild resistance at 50       Fr.      The entire examined stomach was normal. The retroflexed examination of       the cardia revealed GE junction flap to be Hill's Gd 1      The examined duodenum was normal. Impression:               - Mild presbyesophagus s/p esophageal dilatation.                           - Otherwise normal EGD.                           - No specimens collected. Recommendation:           - Return patient to hospital ward for ongoing care.                           - Soft diet today. Then advance as tolerated.                           - Continue present medications including PPIs.                           - Obtained US abdo in AM for epi pain.                           - The findings and recommendations were discussed                            with the patient's  family.  Antimicrobials:  Anti-infectives (From admission, onward)    Start     Dose/Rate Route Frequency Ordered Stop   08/20/21 0600  piperacillin-tazobactam (ZOSYN) IVPB 3.375 g  Status:  Discontinued        3.375 g 12.5 mL/hr over 240 Minutes Intravenous Every 8 hours  08/20/21 0133 08/21/21 1050        Subjective: Seen and examined at bedside and he had a nosebleed earlier this morning.  No chest pain or shortness of breath.  Feels okay and denies any lightheadedness or dizziness.  Continues to have some intermittent abdominal pain.  Unfortunately his paperwork for his trilogy vent was not submitted so will need to be submitted by TOC and needs to be delivered prior to discharge.  Objective: Vitals:   08/28/21 0305 08/28/21 0434 08/28/21 0918 08/28/21 1143  BP:  103/68  126/76  Pulse: 70 70  87  Resp: _0 Temp:  98.4 F (36.9 C)  98.5 F (36.9 C)  TempSrc:  Oral  Oral  SpO2: 100% 100% 98%   Weight:  55.8 kg    Height:        Intake/Output Summary (Last 24 hours) at 08/28/2021 1504 Last data filed at 08/28/2021 0435 Gross per 24 hour  Intake 240 ml  Output 650 ml  Net -410 ml    Filed Weights   08/26/21 0600 08/27/21 0507 08/28/21 0434  Weight: 55.1 kg 55.9 kg 55.8 kg   Examination: Physical Exam:  Constitutional: The patient is a thin chronically ill-appearing African-American male currently in no acute distress appears calm Eyes: Lids and conjunctivae normal, sclerae anicteric  ENMT: External Ears, Nose appear normal. Grossly normal hearing.  Neck: Appears normal, supple, no cervical masses, normal ROM, no appreciable thyromegaly; no appreciable JVD Respiratory: Diminished to auscultation bilaterally, no wheezing, rales, rhonchi or crackles. Normal respiratory effort and patient is not tachypenic. No accessory muscle use.  Wearing supplemental oxygen via nasal cannula and has unlabored breathing Cardiovascular: RRR, no murmurs / rubs / gallops. S1 and S2 auscultated.  Trace extremity edema Abdomen: Soft, non-tender, non-distended. Bowel sounds positive.  GU: Deferred. Musculoskeletal: No clubbing / cyanosis of digits/nails. No joint deformity upper and lower extremities.  Skin: No rashes, lesions, ulcers. No induration; Warm  and dry.  Neurologic: CN 2-12 grossly intact with no focal deficits. Romberg sign and cerebellar reflexes not assessed.  Psychiatric: Normal judgment and insight. Alert and oriented x 3. Normal mood and appropriate affect.   Data Reviewed: I have personally reviewed following labs and imaging studies  CBC: Recent Labs  Lab 08/24/21 0409 08/24/21 1234 08/25/21 0124 08/26/21 0221 08/27/21 0256 08/28/21 0115  WBC 4.5  --  4.6 4.3 4.4 4.8  NEUTROABS  --   --   --  2.8 3.1 3.2  HGB 10.8* 12.9*  12.6* 10.7* 9.5* 9.8* 9.4*  HCT 34.9* 38.0*  37.0* 34.3* 30.9* 31.6* 31.1*  MCV 92.1  --  91.2 92.0 92.1 93.1  PLT 160  --  174 165 159 149    Basic Metabolic Panel: Recent Labs  Lab 08/24/21 0409 08/24/21 1234 08/25/21 0124 08/26/21 0221 08/27/21 0256 08/28/21 0115  NA 135 136  137 134* 136 137 137  K 4.4 4.5  4.3 4.1 4.2 4.3 4.2  CL 90*  --  92* 92* 94* 95*  CO2 39*  --  37* 37* 40* 39*  GLUCOSE 87  --  87 86 82 89  BUN 12  --  _0 CREATININE 0.84  --  1.17 0.88 0.98 0.94  CALCIUM 9.1  --  8.7* 9.1 8.7* 8.7*  MG 2.1  --  1.9 1.8 1.7 2.0  PHOS  --   --  3.7 3.7 3.7 3.1    GFR: Estimated Creatinine Clearance: 60.2 mL/min (by C-G formula based on SCr of 0.94 mg/dL). Liver Function Tests: Recent Labs  Lab 08/26/21 0221 08/27/21 0256 08/28/21 0115  AST _1 ALT _2 ALKPHOS 37* 39 40  BILITOT 0.6 0.4 0.5  PROT 5.3* 5.5* 5.3*  ALBUMIN 2.9* 3.0* 2.9*    No results for input(s): LIPASE, AMYLASE in the last 168 hours. No results for input(s): AMMONIA in the last 168 hours. Coagulation Profile: No results for input(s): INR, PROTIME in the last 168 hours. Cardiac Enzymes: No results for input(s): CKTOTAL, CKMB, CKMBINDEX, TROPONINI in the last 168 hours. BNP (last 3 results) No results for input(s): PROBNP in the last 8760 hours. HbA1C: No results for input(s): HGBA1C in the last 72 hours.  CBG: Recent Labs  Lab 08/27/21 0801 08/27/21 1155  08/27/21 1633 08/28/21 0751 08/28/21 1142  GLUCAP 80 88 100* 78 149*    Lipid Profile: No results for input(s): CHOL, HDL, LDLCALC, TRIG, CHOLHDL, LDLDIRECT in the last 72 hours. Thyroid Function Tests: No results for input(s): TSH, T4TOTAL, FREET4, T3FREE, THYROIDAB in the last 72 hours. Anemia Panel: Recent Labs    08/26/21 1243  VITAMINB12 482  FOLATE 15.4  FERRITIN 72  TIBC 483*  IRON 94  RETICCTPCT 1.0    Sepsis Labs: No results for input(s): PROCALCITON, LATICACIDVEN in the last 168 hours.  Recent Results (from the past 240 hour(s))  Resp Panel by RT-PCR (Flu A&B, Covid) Nasopharyngeal Swab     Status: None   Collection Time: 08/18/21  7:41 PM   Specimen: Nasopharyngeal Swab; Nasopharyngeal(NP) swabs in vial transport medium  Result Value Ref Range Status   SARS Coronavirus 2 by RT PCR NEGATIVE NEGATIVE Final    Comment: (NOTE) SARS-CoV-2 target nucleic acids are NOT DETECTED.  The SARS-CoV-2 RNA is generally detectable in upper respiratory specimens during the acute phase of infection. The lowest concentration of SARS-CoV-2 viral copies this assay can detect is 138 copies/mL. A negative result does not preclude SARS-Cov-2 infection and should not be used as the sole basis for treatment or other patient management decisions. A negative result may occur with  improper specimen collection/handling, submission of specimen other than nasopharyngeal swab, presence of viral mutation(s) within the areas targeted by this assay, and inadequate number of viral copies(<138 copies/mL). A negative result must be combined with clinical observations, patient history, and epidemiological information. The expected result is Negative.  Fact Sheet for Patients:  EntrepreneurPulse.com.au  Fact Sheet for Healthcare Providers:  IncredibleEmployment.be  This test is no t yet approved or cleared by the [place] FDA and  has been authorized  for detection and/or diagnosis of SARS-CoV-2 by FDA under an Emergency Use Authorization (EUA). This EUA will remain  in effect (meaning this test can be used) for the duration of the COVID-19 declaration under Section 564(b)(1) of the Act, 21 U.S.C.section 360bbb-3(b)(1), unless the authorization is terminated  or revoked sooner.       Influenza A by PCR NEGATIVE NEGATIVE Final   Influenza B by PCR NEGATIVE NEGATIVE Final    Comment: (NOTE) The Xpert Xpress SARS-CoV-2/FLU/RSV plus assay is intended as an aid in the diagnosis of influenza  from Nasopharyngeal swab specimens and should not be used as a sole basis for treatment. Nasal washings and aspirates are unacceptable for Xpert Xpress SARS-CoV-2/FLU/RSV testing.  Fact Sheet for Patients: EntrepreneurPulse.com.au  Fact Sheet for Healthcare Providers: IncredibleEmployment.be  This test is not yet approved or cleared by the [place] FDA and has been authorized for detection and/or diagnosis of SARS-CoV-2 by FDA under an Emergency Use Authorization (EUA). This EUA will remain in effect (meaning this test can be used) for the duration of the COVID-19 declaration under Section 564(b)(1) of the Act, 21 U.S.C. section 360bbb-3(b)(1), unless the authorization is terminated or revoked.  Performed at [facility], [address] [place], [place] [address]   Body fluid culture w Gram Stain     Status: None   Collection Time: 08/19/21 12:20 PM   Specimen: PATH Cytology Pleural fluid  Result Value Ref Range Status   Specimen Description   Final    PLEURAL Performed at [facility], [address], [place], [place] [address]    Special Requests   Final    NONE Performed at [facility], [address] [place], [place] [address]    Gram Stain NO WBC SEEN NO ORGANISMS SEEN   Final   Culture   Final    NO GROWTH 3 DAYS Performed at [facility],  [facility] [address], [place], [place] [address]    Report Status 08/23/2021 FINAL  Final  Fungus Culture With Stain     Status: None (Preliminary result)   Collection Time: 08/19/21 12:20 PM   Specimen: Pleural, Left  Result Value Ref Range Status   Fungus Stain Final report  Final    Comment: (NOTE) Performed At: [facility] [address] [place], [place] [id number] Rush Farmer MD PQ:[id number]    Fungus (Mycology) Culture PENDING  Incomplete   Fungal Source PLEURAL  Final    Comment: Performed at [facility], [address] [place], [place] [address]  Fungus Culture Result     Status: None   Collection Time: 08/19/21 12:20 PM  Result Value Ref Range Status   Result 1 Comment  Final    Comment: (NOTE) KOH/Calcofluor preparation:  no fungus observed. Performed At: [facility] [place], [place] [id number] Rush Farmer MD DS:[id number]   CULTURE, BLOOD (ROUTINE X 2) w Reflex to ID Panel     Status: None   Collection Time: 08/19/21  3:29 PM   Specimen: BLOOD  Result Value Ref Range Status   Specimen Description BLOOD BLOOD LEFT FOREARM  Final   Special Requests   Final    BOTTLES DRAWN AEROBIC AND ANAEROBIC Blood Culture adequate volume   Culture   Final    NO GROWTH 5 DAYS Performed at [facility], [address] [place], [place] [address]    Report Status 08/24/2021 FINAL  Final  CULTURE, BLOOD (ROUTINE X 2) w Reflex to ID Panel     Status: None   Collection Time: 08/19/21  3:42 PM   Specimen: BLOOD  Result Value Ref Range Status   Specimen Description BLOOD BLOOD RIGHT FOREARM  Final   Special Requests   Final    BOTTLES DRAWN AEROBIC AND ANAEROBIC Blood Culture results may not be optimal due to an inadequate volume of blood received in culture bottles   Culture   Final    NO GROWTH 5 DAYS Performed at [facility], [address], [place], [place] [address]  Report Status 08/24/2021 FINAL  Final  MRSA  Next Gen by PCR, Nasal     Status: None   Collection Time: 08/20/21 12:45 AM  Result Value Ref Range Status   MRSA by PCR Next Gen NOT DETECTED NOT DETECTED Final    Comment: (NOTE) The GeneXpert MRSA Assay (FDA approved for NASAL specimens only), is one component of a comprehensive MRSA colonization surveillance program. It is not intended to diagnose MRSA infection nor to guide or monitor treatment for MRSA infections. Test performance is not FDA approved in patients less than 59 years old. Performed at [facility], [place] [address], [place], [place] [address]      RN Pressure Injury Documentation:     Estimated body mass index is 18.17 kg/m as calculated from the following:   Height as of this encounter: _0  (1.753 m).   Weight as of this encounter: 55.8 kg.  Malnutrition Type:    Malnutrition Characteristics:   Nutrition Interventions:     Radiology Studies: DG CHEST PORT 1 VIEW  Result Date: 08/28/2021 CLINICAL DATA:  Shortness of breath. EXAM: PORTABLE CHEST 1 VIEW COMPARISON:  08/26/2021 FINDINGS: Mild tracheal deviation left. Normal heart size. White out of the left hemithorax, secondary to prior pneumonectomy with fluid in the operative bed. Peripheral calcifications throughout the left hemithorax again identified. The right costophrenic angle is partially excluded. No pleural fluid or pneumothorax. Mild hyperinflation, without right-sided pulmonary opacity. IMPRESSION: Left-sided pneumonectomy, as before. No acute superimposed process. Electronically Signed   By: Abigail Miyamoto M.D.   On: 08/28/2021 10:39   US Abdomen Limited RUQ (LIVER/GB)  Result Date: 08/27/2021 CLINICAL DATA:  70 year old male with a history epigastric pain EXAM: ULTRASOUND ABDOMEN LIMITED RIGHT UPPER QUADRANT COMPARISON:  None. FINDINGS: Gallbladder: No gallstones or wall thickening visualized. No sonographic Murphy sign noted by sonographer. Common bile duct: Diameter: 2 mm-3 mm Liver:  Anechoic rounded lesion within the anterior right liver, 1 cm x 1 cm x 1.2 cm, with through transmission and no internal complexity. Within normal limits in parenchymal echogenicity. Portal vein is patent on color Doppler imaging with normal direction of blood flow towards the liver. Other: None. IMPRESSION: Negative four cholelithiasis. Simple cyst of the right liver. Electronically Signed   By: Corrie Mckusick D.O.   On: 08/27/2021 10:51    Scheduled Meds:  aspirin  81 mg Oral Daily   enoxaparin (LOVENOX) injection  40 mg Subcutaneous Q24H   influenza vaccine adjuvanted  0.5 mL Intramuscular Tomorrow-1000   mouth rinse  15 mL Mouth Rinse BID   midodrine  10 mg Oral TID WC   pantoprazole  40 mg Oral Daily   simvastatin  20 mg Oral QHS   sodium chloride flush  3 mL Intravenous Q12H   umeclidinium bromide  1 puff Inhalation Daily   Continuous Infusions:  sodium chloride      LOS: 8 days   Kerney Elbe, DO [facility] PAGER is on AMION  If 7PM-7AM, please contact night-coverage [email or web address]

## 2021-08-28 NOTE — TOC Progression Note (Signed)
Transition of Care (TOC) - Progression Note  Marvetta Gibbons RN, BSN Transitions of Care Unit 4E- RN Case Manager See Treatment Team for direct phone #  Weekend coverage  Patient Details  Name: [name] [name] MRN: [id number] Date of Birth: [birth year]  Transition of Care [facility]) CM/SW Contact  Dahlia Client Romeo Rabon, RN Phone Number: 08/28/2021, 3:01 PM  Clinical Narrative:    Notified by MD that pt would need home 02 as well as asking about home NIV. No previous notes found regarding NIV order.  Call made to [facility] with [employer] to see if NIV order had been placed. Per Thedore Mins he has not received any order for NIV on this pt.  Pt will need insurance approval for NIV - Thedore Mins will f/u in the AM and submit order to insurance. [employer] to f/u on NIV/home 02 needs with pt.   Will have weekday TOC team f/u on NIV.      Barriers to Discharge: Ship broker, Equipment Delay  Expected Discharge Plan and Services       Post Acute Care Choice: Durable Medical Equipment                   DME Arranged: NIV DME Agency: [employer] Date DME Agency Contacted: 08/28/21 Time DME Agency Contacted: 1501 Representative spoke with at DME Agency: [place] Determinants of Health ([place]) Interventions    Readmission Risk Interventions No flowsheet data found.

## 2021-08-28 NOTE — Progress Notes (Signed)
Occupational Therapy Evaluation Patient Details Name: [name] [name] MRN: [id number] DOB: [birth year] Today's Date: 08/28/2021    History of Present Illness 70 yo male presents to [facility] 9/1 with SOB, LE edema, bloating. CT chest shows L large pleural effusion, s/p thoracentesis x2 on 9/2 and 9/6, first yielding 900 cc and second yileding 500 cc. Required transfer to ICU for shock, now resolved. Current work up for acute on chronic hypercapnic respiratory failure in setting of emphysema and history of SCC, pulmonary hypertension/R HF. s/p RHC 9/7. 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   Clinical Impression   [name] [name] was evaluated s/p the above admission list. PTA he was indep in all ADL/IADLs and mobility. He lives alone in a 1 level home with 3 STE. Upon evaluation he completed ADLs and functional mobility with supervision- min guard for safety only. Pt stated that he has been having difficulty with tub/shower transfers prior to admission; discussed tub bench use and compensatory techniques for shower transfers. Pt and daughter verbalized understanding. Pt would benefit from continued OT acutely to progress towards his very indep baseline.     Follow Up Recommendations  No OT follow up;Supervision - Intermittent    Equipment Recommendations  None recommended by OT (Home O2)    Recommendations for Other Services       Precautions / Restrictions Precautions Precautions: Fall Precaution Comments: monitor O2 Restrictions Weight Bearing Restrictions: No      Mobility Bed Mobility Overal bed mobility: Needs Assistance             General bed mobility comments: pt OOB upon arrival    Transfers Overall transfer level: Needs assistance Equipment used: None Transfers: Sit to/from Stand Sit to Stand: Supervision              Balance Overall balance assessment: No  apparent balance deficits (not formally assessed) Sitting-balance support: Feet supported Sitting balance-[name] [name] Scale: Good       Standing balance-[name] [name] Scale: Good                             ADL either performed or assessed with clinical judgement   ADL Overall ADL's : Needs assistance/impaired Eating/Feeding: Independent;Sitting   Grooming: Supervision/safety;Standing   Upper Body Bathing: Supervision/ safety;Sitting   Lower Body Bathing: Min guard;Sit to/from stand   Upper Body Dressing : Supervision/safety;Sitting   Lower Body Dressing: Min guard;Sit to/from stand   Toilet Transfer: Supervision/safety;Ambulation   Toileting- Clothing Manipulation and Hygiene: Supervision/safety;Sitting/lateral lean       Functional mobility during ADLs: Supervision/safety General ADL Comments: assist for safety and verbal cues only, pt with no LOB this session and demonstrated good ability to complete ADLs safely     Vision Baseline Vision/History: 1 Wears glasses Vision Assessment?: No apparent visual deficits     Perception     Praxis      Pertinent Vitals/Pain Pain Assessment: No/denies pain Pain Score: 0-No pain Pain Intervention(s): Limited activity within patient's tolerance;Monitored during session     Hand Dominance Right   Extremity/Trunk Assessment Upper Extremity Assessment Upper Extremity Assessment: Overall WFL for tasks assessed   Lower Extremity Assessment Lower Extremity Assessment: Defer to PT evaluation   Cervical / Trunk Assessment Cervical / Trunk Assessment: Normal   Communication Communication Communication: No difficulties   Cognition Arousal/Alertness: Awake/alert Behavior During Therapy: WFL for tasks assessed/performed Overall Cognitive Status:  Within Functional Limits for tasks assessed                                 General Comments: A &O x4. Pt very pleasant and cooperative.   General Comments  VSS on  2LO2    Exercises     Shoulder Instructions      Home Living Family/patient expects to be discharged to:: Private residence Living Arrangements: Other (Comment);Alone Available Help at Discharge: Available PRN/intermittently;Family Type of Home: House Home Access: Stairs to enter [employer] of Steps: 2-3 Entrance Stairs-Rails: Right Home Layout: One level     Bathroom Shower/Tub: Tub/shower unit         Home Equipment: None          Prior Functioning/Environment Level of Independence: Independent        Comments: Pt reports living alone and is Ind in all aspects of care and mobility. He has a daughter nearby that works but checks in on him.        OT Problem List: Decreased activity tolerance;Pain;Decreased safety awareness      OT Treatment/Interventions: Self-care/ADL training;Therapeutic exercise;Therapeutic activities;Patient/family education;Balance training    OT Goals(Current goals can be found in the care plan section) Acute Rehab OT Goals Patient Stated Goal: to go home OT Goal Formulation: With patient Time For Goal Achievement: 09/11/21 Potential to Achieve Goals: Good ADL Goals Pt Will Transfer to Toilet: Independently;regular height toilet Pt Will Perform Tub/Shower Transfer: with supervision  OT Frequency: Min 2X/week   Barriers to D/C: Decreased caregiver support  pt lives alone       Co-evaluation              AM-PAC OT "6 Clicks" Daily Activity     Outcome Measure Help from another [name] [name] eating meals?: None Help from another [name] [name] taking care of personal grooming?: A Little Help from another [name] [name] toileting, which includes using toliet, bedpan, or urinal?: A Little Help from another [name] [name] bathing (including washing, rinsing, drying)?: A Little Help from another [name] [name] to put on and taking off regular upper body clothing?: None Help from another [name] [name] to put on and taking off regular lower body clothing?: A  Little 6 Click Score: 20   End of Session Equipment Utilized During Treatment: Oxygen Nurse Communication: Mobility status;Precautions  Activity Tolerance: Patient tolerated treatment well Patient left: in chair;with family/visitor present  OT Visit Diagnosis: Muscle weakness (generalized) (M62.81);Pain                Time: 9326-7124 OT Time Calculation (min): 10 min Charges:  OT General Charges $OT Visit: 1 Visit OT Evaluation $OT Eval Low Complexity: 1 Low    Maalik Pinn A Brendaliz Kuk 08/28/2021, 4:10 PM

## 2021-08-29 ENCOUNTER — Inpatient Hospital Stay (HOSPITAL_COMMUNITY): Payer: Medicare Other

## 2021-08-29 LAB — CBC WITH DIFFERENTIAL/PLATELET
Abs Immature Granulocytes: 0.01 10*3/uL (ref 0.00–0.07)
Basophils Absolute: 0 10*3/uL (ref 0.0–0.1)
Basophils Relative: 1 %
Eosinophils Absolute: 0.1 10*3/uL (ref 0.0–0.5)
Eosinophils Relative: 4 %
HCT: 30.3 % — ABNORMAL LOW (ref 39.0–52.0)
Hemoglobin: 9.5 g/dL — ABNORMAL LOW (ref 13.0–17.0)
Immature Granulocytes: 0 %
Lymphocytes Relative: 25 %
Lymphs Abs: 1 10*3/uL (ref 0.7–4.0)
MCH: 28.9 pg (ref 26.0–34.0)
MCHC: 31.4 g/dL (ref 30.0–36.0)
MCV: 92.1 fL (ref 80.0–100.0)
Monocytes Absolute: 0.4 10*3/uL (ref 0.1–1.0)
Monocytes Relative: 10 %
Neutro Abs: 2.4 10*3/uL (ref 1.7–7.7)
Neutrophils Relative %: 60 %
Platelets: 172 10*3/uL (ref 150–400)
RBC: 3.29 MIL/uL — ABNORMAL LOW (ref 4.22–5.81)
RDW: 12.8 % (ref 11.5–15.5)
WBC: 4 10*3/uL (ref 4.0–10.5)
nRBC: 0 % (ref 0.0–0.2)

## 2021-08-29 LAB — MAGNESIUM: Magnesium: 1.9 mg/dL (ref 1.7–2.4)

## 2021-08-29 LAB — COMPREHENSIVE METABOLIC PANEL
ALT: 17 U/L (ref 0–44)
AST: 19 U/L (ref 15–41)
Albumin: 2.8 g/dL — ABNORMAL LOW (ref 3.5–5.0)
Alkaline Phosphatase: 40 U/L (ref 38–126)
Anion gap: 9 (ref 5–15)
BUN: 12 mg/dL (ref 8–23)
CO2: 35 mmol/L — ABNORMAL HIGH (ref 22–32)
Calcium: 9 mg/dL (ref 8.9–10.3)
Chloride: 94 mmol/L — ABNORMAL LOW (ref 98–111)
Creatinine, Ser: 0.79 mg/dL (ref 0.61–1.24)
GFR, Estimated: >60 mL/min (ref >60)
Glucose, Bld: 67 mg/dL — ABNORMAL LOW (ref 70–99)
Potassium: 3.9 mmol/L (ref 3.5–5.1)
Sodium: 138 mmol/L (ref 135–145)
Total Bilirubin: 0.7 mg/dL (ref 0.3–1.2)
Total Protein: 5.3 g/dL — ABNORMAL LOW (ref 6.5–8.1)

## 2021-08-29 LAB — PHOSPHORUS: Phosphorus: 3.6 mg/dL (ref 2.5–4.6)

## 2021-08-29 LAB — GLUCOSE, CAPILLARY
Glucose-Capillary: 86 mg/dL (ref 70–99)
Glucose-Capillary: 85 mg/dL (ref 70–99)
Glucose-Capillary: 117 mg/dL — ABNORMAL HIGH (ref 70–99)
Glucose-Capillary: 107 mg/dL — ABNORMAL HIGH (ref 70–99)

## 2021-08-29 NOTE — TOC CM/SW Note (Signed)
Documentation recorded by Shellia Cleverly PT on 08/27/2021 at 1:25 pm. Oxygen orders are in Epic.   SATURATION QUALIFICATIONS: (This note is used to comply with regulatory documentation for home oxygen)   Patient Saturations on Room Air at Rest = 98%   Patient Saturations on Room Air while Ambulating = 81%   Patient Saturations on 2 Liters of oxygen while Ambulating = 91%   Please briefly explain why patient needs home oxygen: to maintain SPO2 >88%   Jonnie Finner RN3 CCM, Heart Failure TOC CM [phone]

## 2021-08-29 NOTE — Progress Notes (Signed)
PROGRESS [name] [name] Puglia  [name] DOB: March 13, 1951 DOA: 08/20/2021 PCP: Patient, No Pcp Per (Inactive)   Brief Narrative:  The patient is a 70 year old AAM who was admitted by the hospitalist service and subsequently transferred to the critical care service for evaluation management of left-sided pleural effusion and empyema.  He initially presented to [facility] ED on 08/18/2021 with complaints of shortness of breath, lower extremity edema and abdominal bloating.  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  He endorsed several months of progressive dyspnea on exertion with increasing left lower extremity edema over the past 2 weeks.  He presented ED and ED work-up was notable for being hypoxic on room air with a sodium of 126 and a BNP of 189.  CT of the chest demonstrated a large left-sided pleural effusion.  Pulmonary was consulted and 08/19/2021 and CT recommended CT surgery consult.  IR completed a thoracentesis with 900 mL of thick consistency pleural fluid removed with a thick calcified rind encasing fluid.  He was transferred to the ICU at [facility] given his shock and also for evaluation by cardiothoracic surgery.  His shock is now resolved and he is transferred back to the [facility] service on 08/25/2021.  Currently is being admitted and treated for acute on chronic hypercapnic respiratory failure in the setting of severe emphysema and history of squamous cell carcinoma with status post left pneumonectomy. Because of his Dysphagia GI was consulted and he underwent an EGD yesterday and had an abdominal ultrasound today.  On ambulation patient desaturated to 81% still require supplemental oxygen at home.  Pulmonary is qualify  him for trilogy vent at home and unfortunately paperwork was not submitted by TOC. Upper quadrant ultrasound showed no cholelithiasis but did show a simple cyst of the right liver that was 1 cm x 1 cm x 1.2 cm.  Trilogy paperwork has been submitted however still awaiting device delivered and patient cannot be safely discharged on this the device is delivered to his house given that he is at very high risk of readmission if he does not have the trilogy vent.  Assessment & Plan:   Active Problems:   Pleural effusion   Shock ([place])   Acute respiratory failure with hypoxia and hypercapnia (HCC)   Hyponatremia  Acute on chronic respiratory failure with hypoxia and hypercapnia in the setting of pleural effusion and COPD -Admitted and then transferred to the ICU and now is out of ICU -Patient has severe emphysema and has a history of squamous cell carcinoma status post left pneumonectomy in 2005 -Pulmonary has been consulted recommending continue BiPAP as needed and nightly -He is status postthoracentesis x2 -ABG    Component Value Date/Time   PHART 7.378 08/23/2021 0920   PCO2ART 76.1 (HH) 08/23/2021 0920   PO2ART 156 (H) 08/23/2021 0920   HCO3 40.9 (H) 08/24/2021 1234   HCO3 42.4 (H) 08/24/2021 1234   TCO2 43 (H) 08/24/2021 1234   TCO2 45 (H) 08/24/2021 1234   O2SAT 68.0 08/24/2021 1234   O2SAT 66.0 08/24/2021 1234    -They are considering obtaining a morning ABG to improve nocturnal hypercapnia and the need for NIV at home -They are recommending continue supportive care -Currently not on any more breathing treatments but will add  Xopenex and Atrovent as needed -Continue with pulm toileting -Pulmonary is arranging Trilogy Vent prior to D/C and planning of outpatient Sleep Study and outpatient PFTs; Trilogy was for unclear reasons not set up by TOC so will submit paperwork in the AM for Insurance Authorization  -Ambulatory home O2 screen done showed the patient will require supplemental  oxygen via nasal cannula; DME O2 2 Liters ordered -Awaiting trilogy insurance authorization and delivered to the equipment  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  -In the setting of COPD -Echocardiogram was consistent with moderate pulmonary hypertension with very mild right ventricular strain -Underwent a right heart cath to quantify pulm hypertension and evaluate his volume status -Cardiology recommending appropriate oxygenation optimizing volume status -Patient is -3. 942 liters  -Currently not volume overloaded on exam still  Hypotension in the setting of shock, improved -Initially required Levophed but now resolved after midodrine we will continue midodrine -There is also initial concern for septic shock and received IV Zosyn x2 but fluid cultures been negative in antibiotics have been discontinued -Continuing low-dose midodrine 5 mg 3 times daily -Continue monitor blood pressures per protocol as last blood pressure reading was 132/69  Hyperlipidemia -Continue statin  Lower Extremity Edema -Improved and he was started on Lasix as an outpatient -Cardiology recommending holding Lasix given recent hypotension and that because he does not appear clinically volume overloaded -Continue monitor for lower extremity edema and he had very mild  COPD -Continue supportive care as above and currently does not appear to be exacerbated -Attempting to get the trilogy vent as above  Hypertension but currently was hypotensive so we will hold his medications -He has been on Coreg, lisinopril/HCTZ and Lasix in the outpatient setting but has been held -Continue to Monitor BP per Protocol -Last BP reading was 132/69  Hyponatremia -Mild.  -Na+ went from 135 -> 137 -> 134 -> 136 -> 137 x2 and today is 138 -Continue to Monitor and Trend -Repeat CMP in the AM   Normocytic Anemia  -Hemoglobin/hematocrit dropped from admission and trended all the way down and has slowly trended  down and went from 10.8/34.9 -> 10.7/34.3 -> 9.5/30.9 and trended up to 9.8/31.6 the day before yesterday and yesterday is now 9.4/31.1 and today it is 9.5/30.3 -Anemia Panel showed an iron level of 94, U IBC 389, TIBC 43, saturation ratios of 19%, ferritin of 72, folate of 15.4, vitamin B12 482 -Continue to Monitor for S/Sx of Bleeding; No overt Bleeding noted -Repeat CBC in the AM  Unintentional weight loss and dysphagia -He is about the same weight as 18 months ago at his oncology visit -Had a prior work-up back in 2022 with a CT abdomen pelvis was negative for structural issues -Had SLP on 9 4 and an esophagram on 9 8 which showed no specific stricture but barium pill did not pass of the stomach raising question of GE junction narrowing -SLP following and they are recommending dysphagia diet -GI will evaluate the patient for further evaluation for possible EGD and dilation -Patient underwent EGD today and will be getting a Abodminal U/S in the AM -EGD showed "The lower third of the esophagus was mildly tortuous. Z-line was well-defined at 38 cm, examined by NBI. The lower esophagus did distend well. No hiatal hernias. Since patient had abn barium swallow, A TTS dilator was passed through the scope. Dilation with a 15-16.5-18 mm balloon dilator was performed to 16.5 mm. The scope was withdrawn. Dilation was performed with a [facility] dilator  with mild resistance at 50 Fr. The entire examined stomach was normal. The retroflexed examination of the cardia revealed GE junction flap to be Hill's Gd 1 The examined duodenum was normal." Overall impression was "Mild presbyesophagus s/p esophageal dilatation. Otherwise normal EGD." -GI recommending Soft Diet and continuing PPI 40 mg po Daily and obtaining U/S of Abdomen which was negative for cholelithiasis but did show a small liver simple cyst L is 1 x 1 x 1.2 cm -Patient continues to have some intermittent abdominal pain  DVT prophylaxis: Enoxaparin 40 mg  subcu q. 24 Code Status: FULL CODE  Family Communication: No family present at bedside Disposition Plan: Pending further clinical improvement and evaluation by PT OT; patient will need oxygen for discharge and Trilogy Vent for D/C and currently trilogy vent is still pending to be delivered  Status is: Inpatient  Remains inpatient appropriate because:Unsafe d/c plan, IV treatments appropriate due to intensity of illness or inability to take PO, and Inpatient level of care appropriate due to severity of illness  Dispo: The patient is from: Home              Anticipated d/c is to:  TBD              Patient currently is not medically stable to d/c.   Difficult to place patient No  Consultants:  Cardiovascular surgery PCCM Cardiology  Procedures:  9/2 Thora with 900cc removed, new levophed requirement, transfer to [facility] 9/6 Thora with 500cc removed  RHC 9/7 with mild to mod PAH, normal left sided pressures.    EGD Findings:      The lower third of the esophagus was mildly tortuous. Z-line was       well-defined at 38 cm, examined by NBI. The lower esophagus did distend       well. No hiatal hernias. Since patient had abn barium swallow, A TTS       dilator was passed through the scope. Dilation with a 15-16.5-18 mm       balloon dilator was performed to 16.5 mm. The scope was withdrawn.       Dilation was performed with a Maloney dilator with mild resistance at 50       Fr.      The entire examined stomach was normal. The retroflexed examination of       the cardia revealed GE junction flap to be Hill's Gd 1      The examined duodenum was normal. Impression:               - Mild presbyesophagus s/p esophageal dilatation.                           - Otherwise normal EGD.                           - No specimens collected. Recommendation:           - Return patient to hospital ward for ongoing care.                           - Soft diet today. Then advance as tolerated.                            - Continue present medications including PPIs.                           -  Obtained US abdo in AM for epi pain.                           - The findings and recommendations were discussed                            with the patient's family.  Antimicrobials:  Anti-infectives (From admission, onward)    Start     Dose/Rate Route Frequency Ordered Stop   08/20/21 0600  piperacillin-tazobactam (ZOSYN) IVPB 3.375 g  Status:  Discontinued        3.375 g 12.5 mL/hr over 240 Minutes Intravenous Every 8 hours 08/20/21 0133 08/21/21 1050        Subjective: Seen and examined at bedside and he had no complaints.  Breathing is stable.  No chest pain or shortness of breath.  Continues to have some intermittent abdominal pain.  No other concerns or complaints at this time.  Objective: Vitals:   08/29/21 0349 08/29/21 0640 08/29/21 0848 08/29/21 1545  BP:  117/73  132/69  Pulse: 71 74 74 82  Resp: _0 Temp:  98.1 F (36.7 C)  97.8 F (36.6 C)  TempSrc:  Oral  Oral  SpO2: 100% 100% 100%   Weight:      Height:        Intake/Output Summary (Last 24 hours) at 08/29/2021 1842 Last data filed at 08/29/2021 5784 Gross per 24 hour  Intake 123 ml  Output 850 ml  Net -727 ml    Filed Weights   08/26/21 0600 08/27/21 0507 08/28/21 0434  Weight: 55.1 kg 55.9 kg 55.8 kg   Examination: Physical Exam:  Constitutional: Patient is a thin chronically ill-appearing African-American male currently in no acute distress appears mildly agitated Eyes: Lids and conjunctivae normal, sclerae anicteric  ENMT: External Ears, Nose appear normal. Grossly normal hearing.  Neck: Appears normal, supple, no cervical masses, normal ROM, no appreciable thyromegaly; no appreciable JVD Respiratory: Diminished to auscultation bilaterally with coarse breath sounds, no wheezing, rales, rhonchi or crackles. Normal respiratory effort and patient is not tachypenic. No accessory muscle use.  Unlabored  breathing wearing supplemental oxygen via nasal cannula Cardiovascular: RRR, no murmurs / rubs / gallops. S1 and S2 auscultated.  Abdomen: Soft, slightly-tender, non-distended. Bowel sounds positive.  GU: Deferred. Musculoskeletal: No clubbing / cyanosis of digits/nails. No joint deformity upper and lower extremities.  Skin: No rashes, lesions, ulcers on limited skin evaluation. No induration; Warm and dry.  Neurologic: CN 2-12 grossly intact with no focal deficits. Romberg sign and cerebellar reflexes not assessed.  Psychiatric: Normal judgment and insight. Alert and oriented x 3.  Slightly agitated mood and flat affect.   Data Reviewed: I have personally reviewed following labs and imaging studies  CBC: Recent Labs  Lab 08/25/21 0124 08/26/21 0221 08/27/21 0256 08/28/21 0115 08/29/21 0434  WBC 4.6 4.3 4.4 4.8 4.0  NEUTROABS  --  2.8 3.1 3.2 2.4  HGB 10.7* 9.5* 9.8* 9.4* 9.5*  HCT 34.3* 30.9* 31.6* 31.1* 30.3*  MCV 91.2 92.0 92.1 93.1 92.1  PLT 174 165 159 156 696    Basic Metabolic Panel: Recent Labs  Lab 08/25/21 0124 08/26/21 0221 08/27/21 0256 08/28/21 0115 08/29/21 0434  NA 134* 136 137 137 138  K 4.1 4.2 4.3 4.2 3.9  CL 92* 92* 94* 95* 94*  CO2 37* 37* 40* 39* 35*  GLUCOSE 87  86 82 89 67*  BUN _0 CREATININE 1.17 0.88 0.98 0.94 0.79  CALCIUM 8.7* 9.1 8.7* 8.7* 9.0  MG 1.9 1.8 1.7 2.0 1.9  PHOS 3.7 3.7 3.7 3.1 3.6    GFR: Estimated Creatinine Clearance: 70.7 mL/min (by C-G formula based on SCr of 0.79 mg/dL). Liver Function Tests: Recent Labs  Lab 08/26/21 0221 08/27/21 0256 08/28/21 0115 08/29/21 0434  AST _1 ALT _2 ALKPHOS 37* 39 40 40  BILITOT 0.6 0.4 0.5 0.7  PROT 5.3* 5.5* 5.3* 5.3*  ALBUMIN 2.9* 3.0* 2.9* 2.8*    No results for input(s): LIPASE, AMYLASE in the last 168 hours. No results for input(s): AMMONIA in the last 168 hours. Coagulation Profile: No results for input(s): INR, PROTIME in the last 168  hours. Cardiac Enzymes: No results for input(s): CKTOTAL, CKMB, CKMBINDEX, TROPONINI in the last 168 hours. BNP (last 3 results) No results for input(s): PROBNP in the last 8760 hours. HbA1C: No results for input(s): HGBA1C in the last 72 hours.  CBG: Recent Labs  Lab 08/28/21 1549 08/28/21 2058 08/29/21 0816 08/29/21 1243 08/29/21 1721  GLUCAP 121* 73 86 85 117*    Lipid Profile: No results for input(s): CHOL, HDL, LDLCALC, TRIG, CHOLHDL, LDLDIRECT in the last 72 hours. Thyroid Function Tests: No results for input(s): TSH, T4TOTAL, FREET4, T3FREE, THYROIDAB in the last 72 hours. Anemia Panel: No results for input(s): VITAMINB12, FOLATE, FERRITIN, TIBC, IRON, RETICCTPCT in the last 72 hours.  Sepsis Labs: No results for input(s): PROCALCITON, LATICACIDVEN in the last 168 hours.  Recent Results (from the past 240 hour(s))  MRSA Next Gen by PCR, Nasal     Status: None   Collection Time: 08/20/21 12:45 AM  Result Value Ref Range Status   MRSA by PCR Next Gen NOT DETECTED NOT DETECTED Final    Comment: (NOTE) The GeneXpert MRSA Assay (FDA approved for NASAL specimens only), is one component of a comprehensive MRSA colonization surveillance program. It is not intended to diagnose MRSA infection nor to guide or monitor treatment for MRSA infections. Test performance is not FDA approved in patients less than 77 years old. Performed at [facility], [place] [address] [place], [place] [address]      RN Pressure Injury Documentation:     Estimated body mass index is 18.17 kg/m as calculated from the following:   Height as of this encounter: _3  (1.753 m).   Weight as of this encounter: 55.8 kg.  Malnutrition Type:    Malnutrition Characteristics:   Nutrition Interventions:     Radiology Studies: DG CHEST PORT 1 VIEW  Result Date: 08/29/2021 CLINICAL DATA:  Shortness of breath EXAM: PORTABLE CHEST 1 VIEW COMPARISON:  08/28/2021 FINDINGS: There again noted  changes consistent with prior pneumonectomy on the left. The left hemithorax demonstrates diffuse pleural calcifications and opacification stable from the prior study. Right lung is hyperinflated but clear. No bony abnormality is seen. IMPRESSION: Chronic changes consistent with prior left pneumonectomy. No acute abnormality seen. Electronically Signed   By: Inez Catalina M.D.   On: 08/29/2021 09:05   DG CHEST PORT 1 VIEW  Result Date: 08/28/2021 CLINICAL DATA:  Shortness of breath. EXAM: PORTABLE CHEST 1 VIEW COMPARISON:  08/26/2021 FINDINGS: Mild tracheal deviation left. Normal heart size. White out of the left hemithorax, secondary to prior pneumonectomy with fluid in the operative bed. Peripheral calcifications throughout the left hemithorax again identified. The right  costophrenic angle is partially excluded. No pleural fluid or pneumothorax. Mild hyperinflation, without right-sided pulmonary opacity. IMPRESSION: Left-sided pneumonectomy, as before. No acute superimposed process. Electronically Signed   By: Abigail Miyamoto M.D.   On: 08/28/2021 10:39    Scheduled Meds:  aspirin  81 mg Oral Daily   enoxaparin (LOVENOX) injection  40 mg Subcutaneous Q24H   influenza vaccine adjuvanted  0.5 mL Intramuscular Tomorrow-1000   mouth rinse  15 mL Mouth Rinse BID   midodrine  10 mg Oral TID WC   pantoprazole  40 mg Oral Daily   simvastatin  20 mg Oral QHS   sodium chloride flush  3 mL Intravenous Q12H   umeclidinium bromide  1 puff Inhalation Daily   Continuous Infusions:  sodium chloride      LOS: 9 days   Kerney Elbe, DO [facility] PAGER is on AMION  If 7PM-7AM, please contact night-coverage [email or web address]

## 2021-08-29 NOTE — Care Management (Signed)
1047 08-29-21  Narrative for the provider to co-sign:  Mr. Giammarco presents with acute on chronic respiratory failure with hypoxia and hypercapnia in the setting of COPD. The use of the NIV will treat patients high PC02 levels (76.1 on 08/23/21 with elevated bicarb of 45) and can reduce risk of exacerbations and future hospitalizations when used at night and during the day.  All alternate devices [phone] and [id number]) have been proven ineffective to provide essential volume control necessary to maintain acceptable CO2 levels. An NIV with AVAPS AE is necessary to prevent patient harm.  Interruption or failure to provide NIV would quickly lead to exacerbation of the patients condition, hospital admission, and likely harm to the patient. Continued use is preferred.  Patient is able to protect their airways and clear secretions on their own.

## 2021-08-29 NOTE — Progress Notes (Signed)
Patient placed back on [place] at this time due to a coughing spell. Will give patient a little time to settle down and will place BiPAP back on then. Vitals stable.

## 2021-08-29 NOTE — TOC Initial Note (Signed)
Transition of Care [facility]) - Initial/Assessment Note    Patient Details  Name: [name] [name] MRN: [id number] Date of Birth: 11-Mar-1951  [facility]) CM/SW Contact:    Erenest Rasher, RN Phone Number: [phone]  08/29/2021, 4:11 PM  Clinical Narrative:                 HF TOC CM spoke to pt at bedside with [place] Rep, Thedore Mins. Trilogy order has been submitted for auth and explained to pt that he cannot dc without device. Provided him with the contact info for his [facility] Medicare policy to request payor review. DME provider has requested [facility] Medicare expedite request but Auth usually take 2-3 days. Discussed HH RN at time of dc. Pt states he will discuss with his dtr, and let CM know tomorrow if he desires HH. Explained to pt the benefits of having a HH RN post dc.  Attending made updated. Pt will dc once trilogy has been received. Will need qualifying sats for oxygen prior to dc.   Expected Discharge Plan: [place] Barriers to Discharge: Continued Medical Work up   Patient Goals and CMS Choice Patient states their goals for this hospitalization and ongoing recovery are:: wants to remain independent CMS Medicare.gov Compare Post Acute Care list provided to:: Patient Choice offered to / list presented to : Patient  Expected Discharge Plan and Services Expected Discharge Plan: [place] In-house Referral: Clinical Social Work Discharge Planning Services: CM Consult Post Acute Care Choice: Durable Medical Equipment, Home Health Living arrangements for the past 2 months: Single Family Home                 DME Arranged: Oxygen DME Agency: Tr[employer] Date DME Agency Contacted: 08/29/21 Time DME Agency Contacted: [phone] Representative spoke with at DME Agency: Andree Coss            Prior Living Arrangements/Services Living arrangements for the past 2 months: [place] Lives with:: Self Patient language and need for  interpreter reviewed:: Yes        Need for Family Participation in Patient Care: No (Comment) Care giver support system in place?: No (comment)   Criminal Activity/Legal Involvement Pertinent to Current Situation/Hospitalization: No - Comment as needed  Activities of Daily Living Home Assistive Devices/Equipment: None ADL Screening (condition at time of admission) Patient's cognitive ability adequate to safely complete daily activities?: Yes Is the patient deaf or have difficulty hearing?: No Does the patient have difficulty seeing, even when wearing glasses/contacts?: No Does the patient have difficulty concentrating, remembering, or making decisions?: No Patient able to express need for assistance with ADLs?: No Does the patient have difficulty dressing or bathing?: No Independently performs ADLs?: Yes (appropriate for developmental age) Does the patient have difficulty walking or climbing stairs?: No Weakness of Legs: None Weakness of Arms/Hands: None  Permission Sought/Granted Permission sought to share information with : Case Manager, Family Supports, PCP Permission granted to share information with : Yes, Verbal Permission Granted  Share Information with NAME: [place] granted to share info w AGENCY: Home Health, DME  Permission granted to share info w Relationship: daughter  Permission granted to share info w Contact Information: [phone]  Emotional Assessment Appearance:: Appears stated age Attitude/Demeanor/Rapport: Gracious, Engaged Affect (typically observed): Accepting Orientation: : Oriented to Self, Oriented to Place, Oriented to  Time, Oriented to Situation   Psych Involvement: No (comment)  Admission diagnosis:  Pleural  effusion [J90] Patient Active Problem List   Diagnosis Date Noted   Pleural effusion 08/20/2021   Shock ([place])    Acute respiratory failure with hypoxia and hypercapnia (HCC)    Hyponatremia    Right-sided heart failure  ([place]) 08/19/2021   Empyema ([place]) 08/19/2021   PCP:  Patient, No Pcp Per (Inactive) Pharmacy:   [id number] [address], [address] [place] [address] Phone: [phone] Fax: [phone]     Social Determinants of Health (SDOH) Interventions    Readmission Risk Interventions No flowsheet data found.

## 2021-08-30 ENCOUNTER — Telehealth: Payer: Self-pay

## 2021-08-30 LAB — CBC WITH DIFFERENTIAL/PLATELET
Abs Immature Granulocytes: 0.01 10*3/uL (ref 0.00–0.07)
Basophils Absolute: 0 10*3/uL (ref 0.0–0.1)
Basophils Relative: 1 %
Eosinophils Absolute: 0.1 10*3/uL (ref 0.0–0.5)
Eosinophils Relative: 3 %
HCT: 29.4 % — ABNORMAL LOW (ref 39.0–52.0)
Hemoglobin: 9.1 g/dL — ABNORMAL LOW (ref 13.0–17.0)
Immature Granulocytes: 0 %
Lymphocytes Relative: 23 %
Lymphs Abs: 1 10*3/uL (ref 0.7–4.0)
MCH: 28.8 pg (ref 26.0–34.0)
MCHC: 31 g/dL (ref 30.0–36.0)
MCV: 93 fL (ref 80.0–100.0)
Monocytes Absolute: 0.4 10*3/uL (ref 0.1–1.0)
Monocytes Relative: 10 %
Neutro Abs: 2.6 10*3/uL (ref 1.7–7.7)
Neutrophils Relative %: 63 %
Platelets: 183 10*3/uL (ref 150–400)
RBC: 3.16 MIL/uL — ABNORMAL LOW (ref 4.22–5.81)
RDW: 12.9 % (ref 11.5–15.5)
WBC: 4.2 10*3/uL (ref 4.0–10.5)
nRBC: 0 % (ref 0.0–0.2)

## 2021-08-30 LAB — GLUCOSE, CAPILLARY
Glucose-Capillary: 82 mg/dL (ref 70–99)
Glucose-Capillary: 126 mg/dL — ABNORMAL HIGH (ref 70–99)

## 2021-08-30 LAB — COMPREHENSIVE METABOLIC PANEL
ALT: 17 U/L (ref 0–44)
AST: 21 U/L (ref 15–41)
Albumin: 2.9 g/dL — ABNORMAL LOW (ref 3.5–5.0)
Alkaline Phosphatase: 39 U/L (ref 38–126)
Anion gap: 6 (ref 5–15)
BUN: 12 mg/dL (ref 8–23)
CO2: 36 mmol/L — ABNORMAL HIGH (ref 22–32)
Calcium: 8.8 mg/dL — ABNORMAL LOW (ref 8.9–10.3)
Chloride: 94 mmol/L — ABNORMAL LOW (ref 98–111)
Creatinine, Ser: 0.86 mg/dL (ref 0.61–1.24)
GFR, Estimated: >60 mL/min (ref >60)
Glucose, Bld: 82 mg/dL (ref 70–99)
Potassium: 4 mmol/L (ref 3.5–5.1)
Sodium: 136 mmol/L (ref 135–145)
Total Bilirubin: 0.4 mg/dL (ref 0.3–1.2)
Total Protein: 5.4 g/dL — ABNORMAL LOW (ref 6.5–8.1)

## 2021-08-30 LAB — PHOSPHORUS: Phosphorus: 3.6 mg/dL (ref 2.5–4.6)

## 2021-08-30 LAB — MAGNESIUM: Magnesium: 1.9 mg/dL (ref 1.7–2.4)

## 2021-08-30 NOTE — Telephone Encounter (Signed)
Got patient scheduled for hospital f/u and PFT with Dr. Loanne Drilling on 10/14/2021. Called and spoke with patient to ask him about his covid vaccines as there is none listed in his chart. He states that he has had them but does not have his card with him in the hospital it is at his house. Advised patient to please call office when he is discharged from hospital to provided that information. He expressed understanding and stated that Dr. Loanne Drilling gave him the clinic information. Will wait to hear from patient with vaccine dates

## 2021-08-30 NOTE — TOC CM/SW Note (Signed)
HF TOC CM spoke to pt and states he did contact [facility] Medicare to follow up on expediting Trilogy. Stated they did receive order and is processing. [employer] will follow up with auth dept to see if device was approved. [place], Heart Failure TOC CM [phone]

## 2021-08-30 NOTE — Telephone Encounter (Signed)
-----  Message from [place], MD sent at 08/26/2021  2:01 PM EDT ----- Regarding: Schedule outpatient follow-up with me Schedule 30 min follow-up with me for hospital follow-up. Schedule when next available with PFTs prior to visit. [place] for late September/October visit

## 2021-08-30 NOTE — Progress Notes (Signed)
Physical Therapy Treatment Patient Details Name: [name] [name] MRN: [id number] DOB: Aug 29, 1951 Today's Date: 08/30/2021   History of Present Illness 70 yo male presents to [facility] 9[facility]1 with SOB, LE edema, bloating. CT chest shows L large pleural effusion, s/p thoracentesis x2 on 9/2 and 9/6, first yielding 900 cc and second yileding 500 cc. Required transfer to ICU for shock, now resolved. Current work up for acute on chronic hypercapnic respiratory failure in setting of emphysema and history of SCC, pulmonary hypertension/R HF. s/p RHC 9/7. 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    PT Comments    Pt was seen for mobility on hallway with O2 used 2L, requires some pacing as he tends to rush his pace with mask, and feels somewhat restricted with the covering.  Talked with him about controlling speed with one standing rest, and about breathing techniques.  Follow along with him and recommend at least consideration of pulm rehab as pt is concerned that he will be on O2 for the rest of his life.  He is focused on maintaining his independence with all mobility, and so should get guidance on how to use O2 as his activity requires.    Recommendations for follow up therapy are one component of a multi-disciplinary discharge planning process, led by the attending physician.  Recommendations may be updated based on patient status, additional functional criteria and insurance authorization.  Follow Up Recommendations  No PT follow up;Supervision for mobility/OOB;Other (comment) (consider pulm rehab)     Equipment Recommendations  None recommended by PT    Recommendations for Other Services       Precautions / Restrictions Precautions Precautions: Fall Precaution Comments: monitor O2 Restrictions Weight Bearing Restrictions: No     Mobility  Bed Mobility               General bed  mobility comments: out of bed when PT arrived    Transfers Overall transfer level: Needs assistance Equipment used: None Transfers: Sit to/from Stand Sit to Stand: Supervision         General transfer comment: PT was Freight forwarder for lines  Ambulation/Gait Ambulation/Gait assistance: Supervision Gait Distance (Feet): 340 Feet Assistive device: None Gait Pattern/deviations: Step-through pattern;Narrow base of support;Decreased stride length Gait velocity: controlled, WFL Gait velocity interpretation: <1.31 ft/sec, indicative of household ambulator General Gait Details: walked with O2 given recent room air trial, maintained on 2L up to 100% after gait but 95% in room   Stairs             Wheelchair Mobility    Modified Rankin (Stroke Patients Only)       Balance Overall balance assessment: No apparent balance deficits (not formally assessed) Sitting-balance support: Feet supported Sitting balance-[name] Scale: Good     Standing balance support: No upper extremity supported Standing balance-[name] Scale: Good                              Cognition Arousal/Alertness: Awake/alert Behavior During Therapy: WFL for tasks assessed/performed Overall Cognitive Status: Within Functional Limits for tasks assessed                                 General Comments: pleasant and cooperative      Exercises      General Comments General comments (skin  integrity, edema, etc.): pt was able to maintain sats on O2, did not try to walk with no O2 given his experience on room air on last PT visit.  Pt is asking how to get O2 weaned down      Pertinent Vitals/Pain Pain Assessment: No/denies pain    Home Living                      Prior Function            PT Goals (current goals can now be found in the care plan section) Acute Rehab PT Goals Patient Stated Goal: to go home Progress towards PT goals: Progressing toward goals     Frequency    Min 3X/week      PT Plan Discharge plan needs to be updated    Co-evaluation              AM-PAC PT "6 Clicks" Mobility   Outcome Measure  Help needed turning from your back to your side while in a flat bed without using bedrails?: None Help needed moving from lying on your back to sitting on the side of a flat bed without using bedrails?: None Help needed moving to and from a bed to a chair (including a wheelchair)?: None Help needed standing up from a chair using your arms (e.g., wheelchair or bedside chair)?: A Little Help needed to walk in hospital room?: A Little Help needed climbing 3-5 steps with a railing? : A Little 6 Click Score: 21    End of Session Equipment Utilized During Treatment: Oxygen Activity Tolerance: Patient tolerated treatment well Patient left: in chair;with call bell/phone within reach Nurse Communication: Mobility status PT Visit Diagnosis: Other abnormalities of gait and mobility (R26.89);Muscle weakness (generalized) (M62.81)     Time: 2099-0689 PT Time Calculation (min) (ACUTE ONLY): 22 min  Charges:  $Gait Training: 8-22 mins               Ramond Dial 08/30/2021, 3:56 PM  Mee Hives, PT MS Acute Rehab Dept. Number: [place]

## 2021-08-30 NOTE — Progress Notes (Signed)
[name] [name] Shutes  HQI:[id number] DOB: May 23, 1951 DOA: 08/20/2021 PCP: Patient, No Pcp Per (Inactive)   Brief Narrative:  The patient is a 70 year old AAM who was admitted by the hospitalist service and subsequently transferred to the critical care service for evaluation management of left-sided pleural effusion and empyema.  He initially presented to [facility] ED on 08/18/2021 with complaints of shortness of breath, lower extremity edema and abdominal bloating.  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  He endorsed several months of progressive dyspnea on exertion with increasing left lower extremity edema over the past 2 weeks.  He presented ED and ED work-up was notable for being hypoxic on room air with a sodium of 126 and a BNP of 189.  CT of the chest demonstrated a large left-sided pleural effusion.  Pulmonary was consulted and 08/19/2021 and CT recommended CT surgery consult.  IR completed a thoracentesis with 900 mL of thick consistency pleural fluid removed with a thick calcified rind encasing fluid.  He was transferred to the ICU at [facility] given his shock and also for evaluation by cardiothoracic surgery.  His shock is now resolved and he is transferred back to the [facility] service on 08/25/2021.  Currently is being admitted and treated for acute on chronic hypercapnic respiratory failure in the setting of severe emphysema and history of squamous cell carcinoma with status post left pneumonectomy. Because of his Dysphagia GI was consulted and he underwent an EGD yesterday and had an abdominal ultrasound today.  On ambulation patient desaturated to 81% still require supplemental oxygen at home.  Pulmonary is qualify  him for trilogy vent at home and unfortunately paperwork was not submitted by TOC. Upper quadrant ultrasound showed no cholelithiasis but did show a simple cyst of the right liver that was 1 cm x 1 cm x 1.2 cm.  Trilogy paperwork has been submitted however still awaiting device delivered and patient cannot be safely discharged on this the device is delivered to his house given that he is at very high risk of readmission if he does not have the Trilogy vent.  Assessment & Plan:   Active Problems:   Pleural effusion   Shock ([place])   Acute respiratory failure with hypoxia and hypercapnia (HCC)   Hyponatremia  Acute on chronic respiratory failure with hypoxia and hypercapnia in the setting of pleural effusion and COPD -Admitted and then transferred to the ICU and now is out of ICU -Patient has severe emphysema and has a history of squamous cell carcinoma status post left pneumonectomy in 2005 -Pulmonary has been consulted recommending continue BiPAP as needed and nightly -He is status Postthoracentesis x2 -ABG    Component Value Date/Time   PHART 7.378 08/23/2021 0920   PCO2ART 76.1 (HH) 08/23/2021 0920   PO2ART 156 (H) 08/23/2021 0920   HCO3 40.9 (H) 08/24/2021 1234   HCO3 42.4 (H) 08/24/2021 1234   TCO2 43 (H) 08/24/2021 1234   TCO2 45 (H) 08/24/2021 1234   O2SAT 68.0 08/24/2021 1234   O2SAT 66.0 08/24/2021 1234    -They are considering obtaining a morning ABG to improve nocturnal hypercapnia and the need for NIV at home -They are recommending continue supportive care -Currently not on any more breathing treatments but will add  Xopenex and Atrovent as needed -Continue with pulm toileting -Pulmonary is arranging Trilogy Vent prior to D/C and planning of outpatient Sleep Study and outpatient PFTs; Trilogy was for unclear reasons not set up by TOC so will submit paperwork in the AM for Insurance Authorization  -Ambulatory home O2 screen done showed the patient will require supplemental  oxygen via nasal cannula; DME O2 2 Liters ordered -Awaiting Trilogy insurance authorization and delivered to the equipment -Patient is Ambulatory and Ambulated with Therapy this AM   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  -In the setting of COPD -Echocardiogram was consistent with moderate pulmonary hypertension with very mild right ventricular strain -Underwent a right heart cath to quantify pulm hypertension and evaluate his volume status -Cardiology recommending appropriate oxygenation optimizing volume status -Patient is -4.817 liters  -Currently not volume overloaded on exam still  Hypotension in the setting of shock, improved -Initially required Levophed but now resolved after midodrine we will continue midodrine -There is also initial concern for septic shock and received IV Zosyn x2 but fluid cultures been negative in antibiotics have been discontinued -Continuing low-dose midodrine 5 mg 3 times daily currently  -Continue monitor blood pressures per protocol as last blood pressure reading was 159/88  Hyperlipidemia -Continue Statin  Lower Extremity Edema -Improved and he was started on Lasix as an outpatient -Cardiology recommending holding Lasix given recent hypotension and that because he does not appear clinically volume overloaded -Continue monitor for lower extremity edema and he had very mild  COPD -Continue supportive care as above and currently does not appear to be exacerbated -Attempting to get the Trilogy vent as above  Hypertension but currently was hypotensive so we will hold his medications -He has been on Coreg, lisinopril/HCTZ and Lasix in the outpatient setting but has been held -Continue to Monitor BP per Protocol -Last BP reading was 159/88  Hyponatremia -Mild.  -Na+ went from 135 -> 137 -> 134 -> 136 -> 137 x2 and today is 138 -> 136 -Continue to Monitor and Trend -Repeat CMP in the AM   Normocytic Anemia   -Hemoglobin/hematocrit is now 9.1/29.4 -Anemia Panel showed an iron level of 94, U IBC 389, TIBC 43, saturation ratios of 19%, ferritin of 72, folate of 15.4, vitamin B12 482 -Continue to Monitor for S/Sx of Bleeding; No overt Bleeding noted -Repeat CBC in the AM  Unintentional weight loss and dysphagia -He is about the same weight as 18 months ago at his oncology visit -Had a prior work-up back in 2022 with a CT abdomen pelvis was negative for structural issues -Had SLP on 9 4 and an esophagram on 9 8 which showed no specific stricture but barium pill did not pass of the stomach raising question of GE junction narrowing -SLP following and they are recommending dysphagia diet -GI will evaluate the patient for further evaluation for possible EGD and dilation -Patient underwent EGD today and will be getting a Abodminal U/S in the AM -EGD showed "The lower third of the esophagus was mildly tortuous. Z-line was well-defined at 38 cm, examined by NBI. The lower esophagus did distend well. No hiatal hernias. Since patient had abn barium swallow, A TTS dilator was passed through the scope. Dilation with a 15-16.5-18 mm balloon dilator was performed to 16.5 mm. The scope was withdrawn. Dilation was performed with a Maloney dilator with mild resistance at 50 Fr. The entire examined stomach was normal. The retroflexed examination of the cardia revealed GE junction flap to be Hill's  Gd 1 The examined duodenum was normal." Overall impression was "Mild presbyesophagus s/p esophageal dilatation. Otherwise normal EGD." -GI recommending Soft Diet and continuing PPI 40 mg po Daily and obtaining U/S of Abdomen which was negative for cholelithiasis but did show a small liver simple cyst L is 1 x 1 x 1.2 cm -Patient continues to have some intermittent abdominal pain -Will need a Diagnostic Colonoscopy in the outpatient setting; Will consider obtaining PSA   DVT prophylaxis: Enoxaparin 40 mg subcu q. 24 Code Status:  FULL CODE  Family Communication: No family present at bedside Disposition Plan: Pending further clinical improvement and evaluation by PT OT; patient will need oxygen for discharge and Trilogy Vent for D/C and currently trilogy vent is still pending to be delivered  Status is: Inpatient  Remains inpatient appropriate because:Unsafe d/c plan, IV treatments appropriate due to intensity of illness or inability to take PO, and Inpatient level of care appropriate due to severity of illness  Dispo: The patient is from: Home              Anticipated d/c is to:  TBD              Patient currently is not medically stable to d/c.   Difficult to place patient No  Consultants:  Cardiovascular surgery PCCM Cardiology  Procedures:  9/2 Thora with 900cc removed, new levophed requirement, transfer to [facility] 9/6 Thora with 500cc removed  RHC 9/7 with mild to mod PAH, normal left sided pressures.    EGD Findings:      The lower third of the esophagus was mildly tortuous. Z-line was       well-defined at 38 cm, examined by NBI. The lower esophagus did distend       well. No hiatal hernias. Since patient had abn barium swallow, A TTS       dilator was passed through the scope. Dilation with a 15-16.5-18 mm       balloon dilator was performed to 16.5 mm. The scope was withdrawn.       Dilation was performed with a Maloney dilator with mild resistance at 50       Fr.      The entire examined stomach was normal. The retroflexed examination of       the cardia revealed GE junction flap to be Hill's Gd 1      The examined duodenum was normal. Impression:               - Mild presbyesophagus s/p esophageal dilatation.                           - Otherwise normal EGD.                           - No specimens collected. Recommendation:           - Return patient to hospital ward for ongoing care.                           - Soft diet today. Then advance as tolerated.                           - Continue  present medications including PPIs.                           -  Obtained US abdo in AM for epi pain.                           - The findings and recommendations were discussed                            with the patient's family.  Antimicrobials:  Anti-infectives (From admission, onward)    Start     Dose/Rate Route Frequency Ordered Stop   08/20/21 0600  piperacillin-tazobactam (ZOSYN) IVPB 3.375 g  Status:  Discontinued        3.375 g 12.5 mL/hr over 240 Minutes Intravenous Every 8 hours 08/20/21 0133 08/21/21 1050        Subjective: Seen and examined at bedside and he feels okay and had no complaints.  Breathing is stable.  No chest pain or shortness of breath.  Still awaiting trilogy vent to be delivered  Objective: Vitals:   08/30/21 0637 08/30/21 0820 08/30/21 0822 08/30/21 1321  BP: 106/68 114/73 114/73 (!) 159/88  Pulse: 75 78 76   Resp: _0 Temp: 98.4 F (36.9 C)  98 F (36.7 C)   TempSrc: Oral  Oral   SpO2: 100% 100% 100%   Weight: 53.5 kg     Height:        Intake/Output Summary (Last 24 hours) at 08/30/2021 1402 Last data filed at 08/30/2021 5009 Gross per 24 hour  Intake --  Output 875 ml  Net -875 ml    Filed Weights   08/27/21 0507 08/28/21 0434 08/30/21 0637  Weight: 55.9 kg 55.8 kg 53.5 kg   Examination: Physical Exam:  Constitutional: The patient is a thin chronically ill-appearing African-American male currently in no acute distress Eyes: Lids and conjunctivae normal, sclerae anicteric  ENMT: External Ears, Nose appear normal. Grossly normal hearing. Neck: Appears normal, supple, no cervical masses, normal ROM, no appreciable thyromegaly; no JVD Respiratory: Diminshed to auscultation bilaterally, no wheezing, rales, rhonchi or crackles. Normal respiratory effort and patient is not tachypenic. No accessory muscle use. Wearing Supplemental O2 via [place] Cardiovascular: RRR, no murmurs / rubs / gallops. S1 and S2 auscultated. No extremity edema.  2+ pedal pulses. No carotid bruits.  GU: Deferred. Musculoskeletal: No clubbing / cyanosis of digits/nails. No joint deformity upper and lower extremities.  Skin: No rashes, lesions, ulcers on a limited skin evaluation. No induration; Warm and dry.  Neurologic: CN 2-12 grossly intact with no focal deficits. Romberg sign and cerebellar reflexes not assessed.  Psychiatric: Normal judgment and insight. Alert and oriented x 3. Normal mood and appropriate affect.   Data Reviewed: I have personally reviewed following labs and imaging studies  CBC: Recent Labs  Lab 08/26/21 0221 08/27/21 0256 08/28/21 0115 08/29/21 0434 08/30/21 0201  WBC 4.3 4.4 4.8 4.0 4.2  NEUTROABS 2.8 3.1 3.2 2.4 2.6  HGB 9.5* 9.8* 9.4* 9.5* 9.1*  HCT 30.9* 31.6* 31.1* 30.3* 29.4*  MCV 92.0 92.1 93.1 92.1 93.0  PLT 165 159 156 172 381    Basic Metabolic Panel: Recent Labs  Lab 08/26/21 0221 08/27/21 0256 08/28/21 0115 08/29/21 0434 08/30/21 0201  NA 136 137 137 138 136  K 4.2 4.3 4.2 3.9 4.0  CL 92* 94* 95* 94* 94*  CO2 37* 40* 39* 35* 36*  GLUCOSE 86 82 89 67* 82  BUN _1 CREATININE 0.88 0.98 0.94 0.79 0.86  CALCIUM 9.1 8.7* 8.7* 9.0 8.8*  MG 1.8 1.7 2.0 1.9 1.9  PHOS 3.7 3.7 3.1 3.6 3.6    GFR: Estimated Creatinine Clearance: 63.1 mL/min (by C-G formula based on SCr of 0.86 mg/dL). Liver Function Tests: Recent Labs  Lab 08/26/21 0221 08/27/21 0256 08/28/21 0115 08/29/21 0434 08/30/21 0201  AST _0 ALT _1 ALKPHOS 37* 39 40 40 39  BILITOT 0.6 0.4 0.5 0.7 0.4  PROT 5.3* 5.5* 5.3* 5.3* 5.4*  ALBUMIN 2.9* 3.0* 2.9* 2.8* 2.9*    No results for input(s): LIPASE, AMYLASE in the last 168 hours. No results for input(s): AMMONIA in the last 168 hours. Coagulation Profile: No results for input(s): INR, PROTIME in the last 168 hours. Cardiac Enzymes: No results for input(s): CKTOTAL, CKMB, CKMBINDEX, TROPONINI in the last 168 hours. BNP (last 3 results) No  results for input(s): PROBNP in the last 8760 hours. HbA1C: No results for input(s): HGBA1C in the last 72 hours.  CBG: Recent Labs  Lab 08/29/21 0816 08/29/21 1243 08/29/21 1721 08/29/21 2225 08/30/21 0817  GLUCAP 86 85 117* 107* 82    Lipid Profile: No results for input(s): CHOL, HDL, LDLCALC, TRIG, CHOLHDL, LDLDIRECT in the last 72 hours. Thyroid Function Tests: No results for input(s): TSH, T4TOTAL, FREET4, T3FREE, THYROIDAB in the last 72 hours. Anemia Panel: No results for input(s): VITAMINB12, FOLATE, FERRITIN, TIBC, IRON, RETICCTPCT in the last 72 hours.  Sepsis Labs: No results for input(s): PROCALCITON, LATICACIDVEN in the last 168 hours.  No results found for this or any previous visit (from the past 240 hour(s)).  RN Pressure Injury Documentation:     Estimated body mass index is 17.42 kg/m as calculated from the following:   Height as of this encounter: _2  (1.753 m).   Weight as of this encounter: 53.5 kg.  Malnutrition Type:    Malnutrition Characteristics:   Nutrition Interventions:     Radiology Studies: DG CHEST PORT 1 VIEW  Result Date: 08/29/2021 CLINICAL DATA:  Shortness of breath EXAM: PORTABLE CHEST 1 VIEW COMPARISON:  08/28/2021 FINDINGS: There again noted changes consistent with prior pneumonectomy on the left. The left hemithorax demonstrates diffuse pleural calcifications and opacification stable from the prior study. Right lung is hyperinflated but clear. No bony abnormality is seen. IMPRESSION: Chronic changes consistent with prior left pneumonectomy. No acute abnormality seen. Electronically Signed   By: Inez Catalina M.D.   On: 08/29/2021 09:05    Scheduled Meds:  aspirin  81 mg Oral Daily   enoxaparin (LOVENOX) injection  40 mg Subcutaneous Q24H   influenza vaccine adjuvanted  0.5 mL Intramuscular Tomorrow-1000   mouth rinse  15 mL Mouth Rinse BID   midodrine  10 mg Oral TID WC   pantoprazole  40 mg Oral Daily   simvastatin  20  mg Oral QHS   sodium chloride flush  3 mL Intravenous Q12H   umeclidinium bromide  1 puff Inhalation Daily   Continuous Infusions:  sodium chloride      LOS: 10 days   Kerney Elbe, DO [facility] PAGER is on AMION  If 7PM-7AM, please contact night-coverage [email or web address]

## 2021-08-31 ENCOUNTER — Inpatient Hospital Stay (HOSPITAL_COMMUNITY): Payer: Medicare Other

## 2021-08-31 LAB — CREATININE, SERUM
Creatinine, Ser: 0.82 mg/dL (ref 0.61–1.24)
GFR, Estimated: >60 mL/min (ref >60)

## 2021-08-31 LAB — GLUCOSE, CAPILLARY: Glucose-Capillary: 81 mg/dL (ref 70–99)

## 2021-08-31 MED ORDER — MIDODRINE HCL 5 MG PO TABS: 5.0000 mg | ORAL_TABLET | Freq: Three times a day (TID) | ORAL | Status: DC

## 2021-08-31 NOTE — Progress Notes (Signed)
PROGRESS NOTE    [name] [name]  WUJ:[id number] DOB: 04-09-1951 DOA: 08/20/2021 PCP: Patient, No Pcp Per (Inactive)   Brief Narrative:  70 year old male who was admitted by the hospitalist service and subsequently transferred to the critical care service for evaluation management of left-sided pleural effusion and empyema.  He initially presented to [facility] ED on 08/18/2021 with complaints of shortness of breath, lower extremity edema and abdominal bloating.  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 CT of the chest demonstrated a large left-sided pleural effusion. IR completed a thoracentesis with 900 mL of thick consistency pleural fluid removed with a thick calcified rind encasing fluid.  He was transferred to the ICU at [facility] given his shock and also for evaluation by cardiothoracic surgery. Pt transferred back to the [facility] service on 08/25/2021. Noted to have dysphagia, GI was consulted and he underwent an EGD. On ambulation patient desaturated to 81% still require supplemental oxygen at home.  Pulmonary recommended trilogy vent at home, still awaiting device.   Assessment & Plan:   Active Problems:   Pleural effusion   Shock ([place])   Acute respiratory failure with hypoxia and hypercapnia (HCC)   Hyponatremia  Acute on chronic respiratory failure with hypoxia and hypercapnia in the setting of pleural effusion and COPD Improving, currently requiring about 3L of O2, bipap nightly and prn Patient has severe emphysema and has a history of squamous cell carcinoma status post left pneumonectomy in 2005 Pulmonary has been consulted recommending continue BiPAP as needed and nightly He is status Post thoracentesis x2 Awaiting Trilogy insurance authorization and delivery of equipment  Pulmonary Hypertension group  3/Right Sided Heart Failure  In the setting of COPD Echocardiogram was consistent with moderate pulmonary hypertension with very mild right ventricular strain Underwent a right heart cath to quantify pulm hypertension and evaluate his volume status Cardiology recommending appropriate oxygenation optimizing volume status  Hypotension in the setting of shock Resolved Initially required Levophed but now resolved after midodrine we will continue midodrine Continuing low-dose midodrine 5 mg 3 times daily currently   Hyperlipidemia Continue Statin  Hypertension He has been on Coreg, lisinopril/HCTZ and Lasix in the outpatient setting but has been held, continue to hold  Normocytic Anemia  Anemia Panel showed an iron level of 94, U IBC 389, TIBC 43, saturation ratios of 19%, ferritin of 72, folate of 15.4, vitamin B12 482 Continue to Monitor for S/Sx of Bleeding; No overt Bleeding noted  Unintentional weight loss and dysphagia Esophageal stricture Had a prior work-up back in 2022 with a CT abdomen pelvis was negative for structural issues Had SLP on 9/4 and an esophagram on 9/8 which showed no specific stricture but barium pill did not pass of the stomach raising question of GE junction narrowing Patient underwent EGD with dilatation of the esophagus, GI recommending PPI and US abdomen which was negative for cholelithiasis but did show a small liver simple cyst L is 1 x 1 x 1.2 cm Plan for Colonoscopy in the outpatient setting     DVT prophylaxis: Enoxaparin 40 mg subcu q. 24 Code Status: FULL CODE  Family Communication: No family present at bedside Disposition Plan: patient will need oxygen for discharge and Trilogy Vent for D/C and currently trilogy vent is still pending to be delivered  Status is: Inpatient  Remains inpatient appropriate  because:Unsafe d/c plan, IV treatments appropriate due to intensity of illness or inability to take PO, and Inpatient level of care appropriate due  to severity of illness  Dispo: The patient is from: Home              Anticipated d/c is to: Home              Patient currently is not medically stable to d/c.   Difficult to place patient No  Consultants:  Cardiovascular surgery PCCM Cardiology  Procedures:  9/2 [place] with 900cc removed, new levophed requirement, transfer to [facility] 9/6 [place]ora with 500cc removed  RHC 9/7 with mild to mod PAH, normal left sided pressures.   EGD    Antimicrobials:  None    Subjective: Reports some subjective SOB, otherwise denies any chest pain, abdominal pain, N/V  Objective: Vitals:   08/31/21 0859 08/31/21 1200 08/31/21 1308 08/31/21 1638  BP:  (!) 147/93 (!) 142/78 127/83  Pulse:   93 94  Resp:   20   Temp:   98.2 F (36.8 C)   TempSrc:   Oral   SpO2: 100%  100% 100%  Weight:      Height:        Intake/Output Summary (Last 24 hours) at 08/31/2021 1900 Last data filed at 08/31/2021 0855 Gross per 24 hour  Intake --  Output 1100 ml  Net -1100 ml   Filed Weights   08/27/21 0507 08/28/21 0434 08/30/21 0637  Weight: 55.9 kg 55.8 kg 53.5 kg   Examination: Physical Exam: General: NAD, chronically ill appearing  Cardiovascular: S1, S2 present Respiratory: Diminished BS b/l Abdomen: Soft, nontender, nondistended, bowel sounds present Musculoskeletal: No bilateral pedal edema noted Skin: Normal Psychiatry: Normal mood     Data Reviewed: I have personally reviewed following labs and imaging studies  CBC: Recent Labs  Lab 08/26/21 0221 08/27/21 0256 08/28/21 0115 08/29/21 0434 08/30/21 0201  WBC 4.3 4.4 4.8 4.0 4.2  NEUTROABS 2.8 3.1 3.2 2.4 2.6  HGB 9.5* 9.8* 9.4* 9.5* 9.1*  HCT 30.9* 31.6* 31.1* 30.3* 29.4*  MCV 92.0 92.1 93.1 92.1 93.0  PLT 165 159 156 172 226   Basic Metabolic Panel: Recent Labs  Lab 08/26/21 0221 08/27/21 0256 08/28/21 0115 08/29/21 0434 08/30/21 0201 08/31/21 0431  NA 136 137 137 138 136  --   K 4.2 4.3 4.2 3.9 4.0  --   CL 92* 94*  95* 94* 94*  --   CO2 37* 40* 39* 35* 36*  --   GLUCOSE 86 82 89 67* 82  --   BUN _0 --   CREATININE 0.88 0.98 0.94 0.79 0.86 0.82  CALCIUM 9.1 8.7* 8.7* 9.0 8.8*  --   MG 1.8 1.7 2.0 1.9 1.9  --   PHOS 3.7 3.7 3.1 3.6 3.6  --    GFR: Estimated Creatinine Clearance: 66.2 mL/min (by C-G formula based on SCr of 0.82 mg/dL). Liver Function Tests: Recent Labs  Lab 08/26/21 0221 08/27/21 0256 08/28/21 0115 08/29/21 0434 08/30/21 0201  AST _1 ALT _2 ALKPHOS 37* 39 40 40 39  BILITOT 0.6 0.4 0.5 0.7 0.4  PROT 5.3* 5.5* 5.3* 5.3* 5.4*  ALBUMIN 2.9* 3.0* 2.9* 2.8* 2.9*   No results for input(s): LIPASE, AMYLASE in the last 168 hours. No results for input(s): AMMONIA in the last 168 hours. Coagulation Profile: No results for input(s): INR, PROTIME in  the last 168 hours. Cardiac Enzymes: No results for input(s): CKTOTAL, CKMB, CKMBINDEX, TROPONINI in the last 168 hours. BNP (last 3 results) No results for input(s): PROBNP in the last 8760 hours. HbA1C: No results for input(s): HGBA1C in the last 72 hours.  CBG: Recent Labs  Lab 08/29/21 1721 08/29/21 2225 08/30/21 0817 08/30/21 2052 08/31/21 0847  GLUCAP 117* 107* 82 126* 81   Lipid Profile: No results for input(s): CHOL, HDL, LDLCALC, TRIG, CHOLHDL, LDLDIRECT in the last 72 hours. Thyroid Function Tests: No results for input(s): TSH, T4TOTAL, FREET4, T3FREE, THYROIDAB in the last 72 hours. Anemia Panel: No results for input(s): VITAMINB12, FOLATE, FERRITIN, TIBC, IRON, RETICCTPCT in the last 72 hours.  Sepsis Labs: No results for input(s): PROCALCITON, LATICACIDVEN in the last 168 hours.  No results found for this or any previous visit (from the past 240 hour(s)).  RN Pressure Injury Documentation:     Estimated body mass index is 17.42 kg/m as calculated from the following:   Height as of this encounter: _0  (1.753 m).   Weight as of this encounter: 53.5  kg.  Malnutrition Type:    Malnutrition Characteristics:   Nutrition Interventions:     Radiology Studies: DG Chest Port 1 View  Result Date: 08/31/2021 CLINICAL DATA:  Shortness of breath EXAM: PORTABLE CHEST 1 VIEW COMPARISON:  08/29/2021 FINDINGS: Unchanged AP portable chest radiograph with total opacification of the left hemithorax and pleural calcification. The right lung is normally aerated. The heart and mediastinal contours are largely obscured. IMPRESSION: Unchanged AP portable chest radiograph with total opacification of the left hemithorax and pleural calcification. The right lung is normally aerated. Electronically Signed   By: Eddie Candle M.D.   On: 08/31/2021 12:39    Scheduled Meds:  aspirin  81 mg Oral Daily   enoxaparin (LOVENOX) injection  40 mg Subcutaneous Q24H   influenza vaccine adjuvanted  0.5 mL Intramuscular Tomorrow-1000   mouth rinse  15 mL Mouth Rinse BID   midodrine  10 mg Oral TID WC   pantoprazole  40 mg Oral Daily   simvastatin  20 mg Oral QHS   sodium chloride flush  3 mL Intravenous Q12H   umeclidinium bromide  1 puff Inhalation Daily   Continuous Infusions:  sodium chloride      LOS: 11 days   Alma Friendly, MD [facility] PAGER is on AMION  If 7PM-7AM, please contact night-coverage [email or web address]

## 2021-08-31 NOTE — TOC CM/SW Note (Addendum)
HF TOC CM contacted [employer] to follow up on Trilogy/NIV authorization. States they have not received auth for device. TOC CM contacted [facility] Medicare ref [id number], to request auth be expedited. Spoke to [facility] Medicare Auth rep, they have expedited the insurance auth for Trilogy and will have answer in 24 hours. Attending updated.    [place], Heart Failure TOC CM [phone]

## 2021-08-31 NOTE — Progress Notes (Signed)
SATURATION QUALIFICATIONS: (This note is used to comply with regulatory documentation for home oxygen)  Patient Saturations on Room Air at Rest = 94%  Patient Saturations on Room Air while Ambulating = 94%, but decreased to as low as 72% rapidly once stopped to rest in standing, needing 2L of O2 and several minutes to rebound to 90%  Patient Saturations on 2 Liters of oxygen while Ambulating = 95%, but would decrease to 86% once stopped to rest in standing  Please briefly explain why patient needs home oxygen: Pt needs home oxygen due to his sats decreasing to as low as 72% without it when mobilizing.     Moishe Spice, PT, DPT Acute Rehabilitation Services  Pager: [phone] Office: [phone]

## 2021-08-31 NOTE — Progress Notes (Signed)
Occupational Therapy Treatment Patient Details Name: [name] [name] MRN: [id number] DOB: 1951-04-09 Today's Date: 08/31/2021   History of present illness 70 yo male presents to [facility] 9/1 with SOB, LE edema, bloating. CT chest shows L large pleural effusion, s/p thoracentesis x2 on 9/2 and 9/6, first yielding 900 cc and second yileding 500 cc. Required transfer to ICU for shock, now resolved. Current work up for acute on chronic hypercapnic respiratory failure in setting of emphysema and history of SCC, pulmonary hypertension/R HF. s/p RHC 9/7. 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   OT comments  Pt. Ed on energy conservation with adls and handout given. Pt. Was ed on use of AE for LE ADLS and was able to return demo. Pt. Was ed on handout for using good energy conservation for iadls. Acute OT to follow.    Recommendations for follow up therapy are one component of a multi-disciplinary discharge planning process, led by the attending physician.  Recommendations may be updated based on patient status, additional functional criteria and insurance authorization.    Follow Up Recommendations  No OT follow up    Equipment Recommendations  None recommended by OT    Recommendations for Other Services      Precautions / Restrictions Precautions Precautions: Fall Precaution Comments: monitor O2 Restrictions Weight Bearing Restrictions: No       Mobility Bed Mobility                    Transfers Overall transfer level: Needs assistance Equipment used: None Transfers: Sit to/from Stand Sit to Stand: Supervision Stand pivot transfers: Supervision            Balance     Sitting balance-[name] Scale: Good       Standing balance-[name] Scale: Good                             ADL either performed or assessed with clinical judgement   ADL Overall  ADL's : Needs assistance/impaired Eating/Feeding: Independent;Sitting   Grooming: Supervision/safety;Standing   Upper Body Bathing: Supervision/ safety;Sitting   Lower Body Bathing: Sit to/from stand;Supervison/ safety   Upper Body Dressing : Supervision/safety;Sitting   Lower Body Dressing: Sit to/from stand;Supervision/safety Lower Body Dressing Details (indicate cue type and reason): Pt. ed on energy conservation with use of ae Toilet Transfer: Supervision/safety;Ambulation   Toileting- Clothing Manipulation and Hygiene: Supervision/safety;Sitting/lateral lean       Functional mobility during ADLs: Supervision/safety General ADL Comments: Pt. given handout for energy conservation     Vision       Perception     Praxis      Cognition Arousal/Alertness: Awake/alert Behavior During Therapy: WFL for tasks assessed/performed Overall Cognitive Status: Within Functional Limits for tasks assessed                                 General Comments: pleasant and cooperative        Exercises     Shoulder Instructions       General Comments      Pertinent Vitals/ Pain       Pain Assessment: 0-10 Pain Score: 4  Pain Location: l shld Pain Descriptors / Indicators: Aching Pain Intervention(s): Patient requesting pain meds-RN notified  Home Living  Prior Functioning/Environment              Frequency  Min 2X/week        Progress Toward Goals  OT Goals(current goals can now be found in the care plan section)  Progress towards OT goals: Progressing toward goals  Acute Rehab OT Goals Patient Stated Goal: to breath better OT Goal Formulation: With patient Time For Goal Achievement: 09/11/21 Potential to Achieve Goals: Good ADL Goals Pt Will Perform Grooming: with modified independence Pt Will Perform Lower Body Dressing: with modified independence Pt Will Transfer to Toilet:  Independently;regular height toilet Pt Will Perform Toileting - Clothing Manipulation and hygiene: with modified independence Pt Will Perform Tub/Shower Transfer: with supervision  Plan Discharge plan remains appropriate    Co-evaluation                 AM-PAC OT "6 Clicks" Daily Activity     Outcome Measure   Help from another M[place]sen eating meals?: None Help from another Kw[place]inski taking care of personal grooming?: A Little Help from another Nin toileting, which includes using toliet, bedpan, or urinal?: A Little Help from another [place] bathing (including washing, rinsing, drying)?: A Little Help from another Arnaud to put on and taking off regular upper body clothing?: None Help from another Agard to put on and taking off regular lower body clothing?: A Little 6 Click Score: 20    End of Session Equipment Utilized During Treatment: Oxygen  OT Visit Diagnosis: Muscle weakness (generalized) (M62.81);Pain   Activity Tolerance Patient tolerated treatment well   Patient Left in chair;with call bell/phone within reach;with chair alarm set   Nurse Communication Patient requests pain meds        Time: 1232-1306 OT Time Calculation (min): 34 min  Charges: OT General Charges $OT Visit: 1 Visit OT Treatments $Self Care/Home Management : 23-37 mins  .ste  Jedi Catalfamo 08/31/2021, 1:34 PM

## 2021-08-31 NOTE — Progress Notes (Signed)
Physical Therapy Treatment Patient Details Name: [name] [name] MRN: [id number] DOB: 03/30/1951 Today's Date: 08/31/2021   History of Present Illness 70 yo male presents to [facility] 9/1 with SOB, LE edema, bloating. CT chest shows L large pleural effusion, s/p thoracentesis x2 on 9/2 and 9/6, first yielding 900 cc and second yileding 500 cc. Required transfer to ICU for shock, now resolved. Current work up for acute on chronic hypercapnic respiratory failure in setting of emphysema and history of SCC, pulmonary hypertension/R HF. s/p RHC 9/7. 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    PT Comments    Pt continues to be limited in exertion/activity by deficits in aerobic endurance. At about the half-way mark during his gait bout the pt took a standing rest break, in which his sats decreased from >/= 94% on RA while ambulating to as low as 72% when standing to rest on RA, provided 2 L for pt to rebound to >/= 90% after several minutes of pursed lip breathing. Pt remained on 1-2L O2 rest of session with SpO2 ranging from 80-90s%. Extensive education provided on energy conservation, increasing frequency of activity, progressing self safely, and monitoring signs/symptoms. Will continue to follow acutely. Current recommendations remain appropriate.     Recommendations for follow up therapy are one component of a multi-disciplinary discharge planning process, led by the attending physician.  Recommendations may be updated based on patient status, additional functional criteria and insurance authorization.  Follow Up Recommendations  No PT follow up;Supervision for mobility/OOB;Other (comment) (consider pulm rehab)     Equipment Recommendations  None recommended by PT    Recommendations for Other Services       Precautions / Restrictions Precautions Precautions: Fall Precaution Comments:  monitor O2 Restrictions Weight Bearing Restrictions: No     Mobility  Bed Mobility               General bed mobility comments: Pt sitting in recliner upon arrival.    Transfers Overall transfer level: Independent Equipment used: None Transfers: Sit to/from Stand Sit to Stand: Independent         General transfer comment: Independent with transfer to stand from recliner, no LOB.  Ambulation/Gait Ambulation/Gait assistance: Supervision Gait Distance (Feet): 400 Feet (x1 standing rest break) Assistive device: None Gait Pattern/deviations: Step-through pattern;Narrow base of support;Decreased stride length Gait velocity: controlled, WFL Gait velocity interpretation: 1.31 - 2.62 ft/sec, indicative of limited community ambulator General Gait Details: Cues provided for pt to pace self and stand to rest as needed. At about the half-way mark pt took a standing rest break, in which his sats decreased from >/= 94% on RA while ambulating to as low as 72% when standing to rest on RA, provided 2 L for pt to rebound to >/= 90% after several minutes of pursed lip breathing, remained on 1-2L O2 rest of session with SpO2 ranging from 80-90s%   Stairs             Wheelchair Mobility    Modified Rankin (Stroke Patients Only)       Balance Overall balance assessment: No apparent balance deficits (not formally assessed) Sitting-balance support: Feet supported Sitting balance-[name] Scale: Good     Standing balance support: No upper extremity supported Standing balance-[name] Scale: Good  Cognition Arousal/Alertness: Awake/alert Behavior During Therapy: WFL for tasks assessed/performed Overall Cognitive Status: Within Functional Limits for tasks assessed                                 General Comments: pleasant and cooperative      Exercises Other Exercises Other Exercises: Squats with hands on rail at end of  bed, x25 reps with x3 standing rest breaks    General Comments General comments (skin integrity, edema, etc.): At about the half-way mark of gait bout pt took a standing rest break, in which his sats decreased from >/= 94% on RA while ambulating to as low as 72% when standing to rest on RA, provided 2 L for pt to rebound to >/= 90% after several minutes of pursed lip breathing, remained on 1-2L O2 rest of session with SpO2 ranging from 80-90s%; extensive education on increasing frequency of activity, progressing self, energy conservation, and monitoring symptoms/signs      Pertinent Vitals/Pain Pain Assessment: Faces Faces Pain Scale: Hurts a little bit Pain Location: shoulder Pain Descriptors / Indicators: Aching Pain Intervention(s): Limited activity within patient's tolerance;Monitored during session;Repositioned    Home Living                      Prior Function            PT Goals (current goals can now be found in the care plan section) Acute Rehab PT Goals Patient Stated Goal: to go home and return to baseline PT Goal Formulation: With patient Time For Goal Achievement: 09/10/21 Potential to Achieve Goals: Good Progress towards PT goals: Progressing toward goals    Frequency    Min 3X/week      PT Plan Current plan remains appropriate    Co-evaluation              AM-PAC PT "6 Clicks" Mobility   Outcome Measure  Help needed turning from your back to your side while in a flat bed without using bedrails?: None Help needed moving from lying on your back to sitting on the side of a flat bed without using bedrails?: None Help needed moving to and from a bed to a chair (including a wheelchair)?: None Help needed standing up from a chair using your arms (e.g., wheelchair or bedside chair)?: None Help needed to walk in hospital room?: A Little Help needed climbing 3-5 steps with a railing? : A Little 6 Click Score: 22    End of Session Equipment  Utilized During Treatment: Oxygen Activity Tolerance: Patient tolerated treatment well Patient left: with call bell/phone within reach (standing near recliner) Nurse Communication: Mobility status;Other (comment) (sats) PT Visit Diagnosis: Other abnormalities of gait and mobility (R26.89);Muscle weakness (generalized) (M62.81)     Time: 8346-2194 PT Time Calculation (min) (ACUTE ONLY): 32 min  Charges:  $Gait Training: 8-22 mins $Therapeutic Exercise: 8-22 mins                     Moishe Spice, PT, DPT Acute Rehabilitation Services  Pager: [phone] Office: [place] 08/31/2021, 6:24 PM

## 2021-09-01 ENCOUNTER — Inpatient Hospital Stay (HOSPITAL_COMMUNITY): Payer: Medicare Other

## 2021-09-01 LAB — GLUCOSE, CAPILLARY
Glucose-Capillary: 74 mg/dL (ref 70–99)
Glucose-Capillary: 190 mg/dL — ABNORMAL HIGH (ref 70–99)
Glucose-Capillary: 66 mg/dL — ABNORMAL LOW (ref 70–99)
Glucose-Capillary: 131 mg/dL — ABNORMAL HIGH (ref 70–99)

## 2021-09-01 LAB — MISC LABCORP TEST (SEND OUT): Labcorp test code: 5367

## 2021-09-01 MED ORDER — CARVEDILOL 3.125 MG PO TABS
3.1250 mg | ORAL_TABLET | Freq: Two times a day (BID) | ORAL | Status: DC
  Administered 2021-09-01 – 2021-09-16 (×31): 3.125 mg via ORAL
  Filled 2021-09-01 (×31): qty 1

## 2021-09-01 MED ORDER — POLYETHYLENE GLYCOL 3350 17 G PO PACK
17.0000 g | PACK | Freq: Every day | ORAL | Status: DC
  Administered 2021-09-01 – 2021-09-11 (×5): 17 g via ORAL
  Filled 2021-09-01 (×16): qty 1

## 2021-09-01 MED ORDER — FUROSEMIDE 10 MG/ML IJ SOLN
40.0000 mg | Freq: Once | INTRAMUSCULAR | Status: AC
  Administered 2021-09-01: 40 mg via INTRAVENOUS
  Filled 2021-09-01: qty 4

## 2021-09-01 MED ORDER — SENNOSIDES-DOCUSATE SODIUM 8.6-50 MG PO TABS
1.0000 | ORAL_TABLET | Freq: Two times a day (BID) | ORAL | Status: DC
  Administered 2021-09-01 – 2021-09-16 (×9): 1 via ORAL
  Filled 2021-09-01 (×24): qty 1

## 2021-09-01 NOTE — Progress Notes (Signed)
SLP Note  Patient Details Name: [name] [name] MRN: [id number] DOB: October 10, 1951  Pts needs have been appropriately addressed by GI. No further SLP interventions needed at this time. Will sign off.   Yaniah Thiemann, Katherene Ponto 09/01/2021, 2:02 PM

## 2021-09-01 NOTE — Care Management (Signed)
1032 09-01-21 Case Manager received notification that [employer] is still awaiting insurance authorization from [facility] for the Trilogy. Authorization is still pending. Once approved, Trilogy will be delivered to the room before transition home. Case Manager will continue to follow.

## 2021-09-01 NOTE — Progress Notes (Signed)
PROGRESS NOTE    [name] [name]  DGU:[id number] DOB: 1951/10/29 DOA: 08/20/2021 PCP: Patient, No Pcp Per (Inactive)   Brief Narrative:  70 year old male who was admitted by the hospitalist service and subsequently transferred to the critical care service for evaluation management of left-sided pleural effusion and empyema.  He initially presented to [facility] ED on 08/18/2021 with complaints of shortness of breath, lower extremity edema and abdominal bloating.  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 CT of the chest demonstrated a large left-sided pleural effusion. IR completed a thoracentesis with 900 mL of thick consistency pleural fluid removed with a thick calcified rind encasing fluid.  He was transferred to the ICU at [facility] given his shock and also for evaluation by cardiothoracic surgery. Pt transferred back to the [facility] service on 08/25/2021. Noted to have dysphagia, GI was consulted and he underwent an EGD. On ambulation patient desaturated to 81% still require supplemental oxygen at home.  Pulmonary recommended trilogy vent at home, still awaiting device.   Assessment & Plan:   Active Problems:   Pleural effusion   Shock ([place])   Acute respiratory failure with hypoxia and hypercapnia (HCC)   Hyponatremia  Acute on chronic respiratory failure with hypoxia and hypercapnia in the setting of pleural effusion and COPD Currently requiring about 3L of O2, bipap nightly and prn Patient has severe emphysema and has a history of squamous cell carcinoma status post left pneumonectomy in 2005 Pulmonary has been consulted recommending continue BiPAP as needed and nightly He is status Post thoracentesis x2 Repeat CXR done on 08/31/21 unchanged from previous as pt continues to c/o subjective SOB (discussed with Dr  Tamala Julian PCCM on 09/01/21 about latest CXR findings, no further recommendation except for trilogy Awaiting Trilogy insurance authorization and delivery of equipment  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  In the setting of COPD Echocardiogram was consistent with moderate pulmonary hypertension with very mild right ventricular strain Underwent a right heart cath to quantify pulm hypertension and evaluate his volume status Cardiology recommending appropriate oxygenation optimizing volume status  Hypotension in the setting of shock Resolved Initially required Levophed but now resolved D/c midodrine as BP now elevated  Hyperlipidemia Continue Statin  Hypertension He has been on Coreg, lisinopril/HCTZ and Lasix in the outpatient setting but has been held Restart coreg for now  Normocytic Anemia  Anemia Panel showed an iron level of 94, U IBC 389, TIBC 43, saturation ratios of 19%, ferritin of 72, folate of 15.4, vitamin B12 482 Continue to Monitor for S/Sx of Bleeding; No overt Bleeding noted  Unintentional weight loss and dysphagia Esophageal stricture Had a prior work-up back in 2022 with a CT abdomen pelvis was negative for structural issues Had SLP on 9/4 and an esophagram on 9/8 which showed no specific stricture but barium pill did not pass of the stomach raising question of GE junction narrowing Patient underwent EGD with dilatation of the esophagus, GI recommending PPI and US abdomen which was negative for cholelithiasis but did show a small liver simple cyst L is 1 x 1 x 1.2 cm Plan for Colonoscopy in the outpatient setting     DVT prophylaxis: Enoxaparin 40 mg subcu q. 24 Code Status: FULL CODE  Family Communication: No family present at bedside Disposition Plan: patient will need oxygen for discharge and  Trilogy Vent for D/C and currently trilogy vent is still pending to be delivered  Status is: Inpatient  Remains inpatient appropriate because:Unsafe d/c  plan, IV treatments appropriate due to intensity of illness or inability to take PO, and Inpatient level of care appropriate due to severity of illness  Dispo: The patient is from: Home              Anticipated d/c is to: Home              Patient currently is not medically stable to d/c.   Difficult to place patient No  Consultants:  Cardiovascular surgery PCCM Cardiology  Procedures:  9/2 [place]ora with 900cc removed, new levophed requirement, transfer to [facility] 9/6 [place]ora with 500cc removed  RHC 9/7 with mild to mod PAH, normal left sided pressures.   EGD    Antimicrobials:  None    Subjective: Still reporting subjective SOB, some abdominal cramp. Denies any chest pain, N/V.   Objective: Vitals:   09/01/21 0700 09/01/21 0800 09/01/21 0826 09/01/21 1422  BP:  (!) 151/98  (!) 166/85  Pulse: 86 88  85  Resp: 18   18  Temp:   98.1 F (36.7 C) 98.6 F (37 C)  TempSrc:    Oral  SpO2: 99% 100% 99%   Weight: 58.6 kg     Height: _0  (1.753 m)       Intake/Output Summary (Last 24 hours) at 09/01/2021 1508 Last data filed at 09/01/2021 1427 Gross per 24 hour  Intake 120 ml  Output 2350 ml  Net -2230 ml   Filed Weights   08/30/21 0637 09/01/21 0600 09/01/21 0700  Weight: 53.5 kg 58.6 kg 58.6 kg   Examination: Physical Exam: General: NAD, chronically ill appearing  Cardiovascular: S1, S2 present Respiratory: Diminished BS b/l Abdomen: Soft, nontender, nondistended, bowel sounds present Musculoskeletal: No bilateral pedal edema noted Skin: Normal Psychiatry: Normal mood     Data Reviewed: I have personally reviewed following labs and imaging studies  CBC: Recent Labs  Lab 08/26/21 0221 08/27/21 0256 08/28/21 0115 08/29/21 0434 08/30/21 0201  WBC 4.3 4.4 4.8 4.0 4.2  NEUTROABS 2.8 3.1 3.2 2.4 2.6  HGB 9.5* 9.8* 9.4* 9.5* 9.1*  HCT 30.9* 31.6* 31.1* 30.3* 29.4*  MCV 92.0 92.1 93.1 92.1 93.0  PLT 165 159 156 172 287   Basic Metabolic Panel: Recent Labs   Lab 08/26/21 0221 08/27/21 0256 08/28/21 0115 08/29/21 0434 08/30/21 0201 08/31/21 0431  NA 136 137 137 138 136  --   K 4.2 4.3 4.2 3.9 4.0  --   CL 92* 94* 95* 94* 94*  --   CO2 37* 40* 39* 35* 36*  --   GLUCOSE 86 82 89 67* 82  --   BUN _1 --   CREATININE 0.88 0.98 0.94 0.79 0.86 0.82  CALCIUM 9.1 8.7* 8.7* 9.0 8.8*  --   MG 1.8 1.7 2.0 1.9 1.9  --   PHOS 3.7 3.7 3.1 3.6 3.6  --    GFR: Estimated Creatinine Clearance: 72.5 mL/min (by C-G formula based on SCr of 0.82 mg/dL). Liver Function Tests: Recent Labs  Lab 08/26/21 0221 08/27/21 0256 08/28/21 0115 08/29/21 0434 08/30/21 0201  AST _2 ALT _3 ALKPHOS 37* 39 40 40 39  BILITOT 0.6 0.4 0.5 0.7 0.4  PROT 5.3* 5.5* 5.3* 5.3* 5.4*  ALBUMIN 2.9* 3.0* 2.9* 2.8* 2.9*  No results for input(s): LIPASE, AMYLASE in the last 168 hours. No results for input(s): AMMONIA in the last 168 hours. Coagulation Profile: No results for input(s): INR, PROTIME in the last 168 hours. Cardiac Enzymes: No results for input(s): CKTOTAL, CKMB, CKMBINDEX, TROPONINI in the last 168 hours. BNP (last 3 results) No results for input(s): PROBNP in the last 8760 hours. HbA1C: No results for input(s): HGBA1C in the last 72 hours.  CBG: Recent Labs  Lab 08/30/21 0817 08/30/21 2052 08/31/21 0847 09/01/21 0745 09/01/21 1205  GLUCAP 82 126* 81 74 190*   Lipid Profile: No results for input(s): CHOL, HDL, LDLCALC, TRIG, CHOLHDL, LDLDIRECT in the last 72 hours. Thyroid Function Tests: No results for input(s): TSH, T4TOTAL, FREET4, T3FREE, THYROIDAB in the last 72 hours. Anemia Panel: No results for input(s): VITAMINB12, FOLATE, FERRITIN, TIBC, IRON, RETICCTPCT in the last 72 hours.  Sepsis Labs: No results for input(s): PROCALCITON, LATICACIDVEN in the last 168 hours.  No results found for this or any previous visit (from the past 240 hour(s)).  RN Pressure Injury Documentation:     Estimated  body mass index is 19.08 kg/m as calculated from the following:   Height as of this encounter: _0  (1.753 m).   Weight as of this encounter: 58.6 kg.  Malnutrition Type:    Malnutrition Characteristics:   Nutrition Interventions:     Radiology Studies: DG Chest Port 1 View  Result Date: 08/31/2021 CLINICAL DATA:  Shortness of breath EXAM: PORTABLE CHEST 1 VIEW COMPARISON:  08/29/2021 FINDINGS: Unchanged AP portable chest radiograph with total opacification of the left hemithorax and pleural calcification. The right lung is normally aerated. The heart and mediastinal contours are largely obscured. IMPRESSION: Unchanged AP portable chest radiograph with total opacification of the left hemithorax and pleural calcification. The right lung is normally aerated. Electronically Signed   By: Eddie Candle M.D.   On: 08/31/2021 12:39    Scheduled Meds:  aspirin  81 mg Oral Daily   carvedilol  3.125 mg Oral BID WC   enoxaparin (LOVENOX) injection  40 mg Subcutaneous Q24H   influenza vaccine adjuvanted  0.5 mL Intramuscular Tomorrow-1000   mouth rinse  15 mL Mouth Rinse BID   pantoprazole  40 mg Oral Daily   simvastatin  20 mg Oral QHS   sodium chloride flush  3 mL Intravenous Q12H   umeclidinium bromide  1 puff Inhalation Daily   Continuous Infusions:  sodium chloride      LOS: 12 days   Alma Friendly, MD [facility] PAGER is on AMION  If 7PM-7AM, please contact night-coverage [email or web address]

## 2021-09-02 ENCOUNTER — Telehealth: Payer: Self-pay | Admitting: Pulmonary Disease

## 2021-09-02 LAB — BASIC METABOLIC PANEL
Anion gap: 10 (ref 5–15)
BUN: 11 mg/dL (ref 8–23)
CO2: 34 mmol/L — ABNORMAL HIGH (ref 22–32)
Calcium: 9.4 mg/dL (ref 8.9–10.3)
Chloride: 93 mmol/L — ABNORMAL LOW (ref 98–111)
Creatinine, Ser: 0.86 mg/dL (ref 0.61–1.24)
GFR, Estimated: >60 mL/min (ref >60)
Glucose, Bld: 79 mg/dL (ref 70–99)
Potassium: 3.9 mmol/L (ref 3.5–5.1)
Sodium: 137 mmol/L (ref 135–145)

## 2021-09-02 LAB — CBC WITH DIFFERENTIAL/PLATELET
Abs Immature Granulocytes: 0.01 10*3/uL (ref 0.00–0.07)
Basophils Absolute: 0 10*3/uL (ref 0.0–0.1)
Basophils Relative: 0 %
Eosinophils Absolute: 0.1 10*3/uL (ref 0.0–0.5)
Eosinophils Relative: 3 %
HCT: 29.8 % — ABNORMAL LOW (ref 39.0–52.0)
Hemoglobin: 9.3 g/dL — ABNORMAL LOW (ref 13.0–17.0)
Immature Granulocytes: 0 %
Lymphocytes Relative: 19 %
Lymphs Abs: 0.9 10*3/uL (ref 0.7–4.0)
MCH: 28.7 pg (ref 26.0–34.0)
MCHC: 31.2 g/dL (ref 30.0–36.0)
MCV: 92 fL (ref 80.0–100.0)
Monocytes Absolute: 0.6 10*3/uL (ref 0.1–1.0)
Monocytes Relative: 13 %
Neutro Abs: 3 10*3/uL (ref 1.7–7.7)
Neutrophils Relative %: 65 %
Platelets: 240 10*3/uL (ref 150–400)
RBC: 3.24 MIL/uL — ABNORMAL LOW (ref 4.22–5.81)
RDW: 13.2 % (ref 11.5–15.5)
WBC: 4.6 10*3/uL (ref 4.0–10.5)
nRBC: 0 % (ref 0.0–0.2)

## 2021-09-02 LAB — GLUCOSE, CAPILLARY
Glucose-Capillary: 79 mg/dL (ref 70–99)
Glucose-Capillary: 122 mg/dL — ABNORMAL HIGH (ref 70–99)

## 2021-09-02 MED ORDER — LISINOPRIL 10 MG PO TABS
10.0000 mg | ORAL_TABLET | Freq: Every day | ORAL | Status: DC
  Administered 2021-09-02 – 2021-09-04 (×3): 10 mg via ORAL
  Filled 2021-09-02 (×3): qty 1

## 2021-09-02 MED ORDER — FUROSEMIDE 10 MG/ML IJ SOLN
40.0000 mg | Freq: Every day | INTRAMUSCULAR | Status: DC
  Administered 2021-09-02: 40 mg via INTRAVENOUS
  Filled 2021-09-02: qty 4

## 2021-09-02 NOTE — Progress Notes (Signed)
Physical Therapy Treatment Patient Details Name: [name] [name] MRN: [id number] DOB: 12-16-1951 Today's Date: 09/02/2021   History of Present Illness 70 yo male presents to [facility] 9/1 with SOB, LE edema, bloating. CT chest shows L large pleural effusion, s/p thoracentesis x2 on 9/2 and 9/6, first yielding 900 cc and second yileding 500 cc. Required transfer to ICU for shock, now resolved. Current work up for acute on chronic hypercapnic respiratory failure in setting of emphysema and history of SCC, pulmonary hypertension/R HF. s/p RHC 9/7. 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    PT Comments    Educated pt and provided handout on [employer]. Attempted to progress pt's aerobic endurance through bouts of activity, including gait bouts and sets of standing exercises, see Exercises below. Pt continues to desat to 80%, even on 1-2 L of O2 when ambulating, needing several minutes of pursed lip breathing during a standing rest break to rebound. Will continue to follow acutely. Current recommendations remain appropriate.    Recommendations for follow up therapy are one component of a multi-disciplinary discharge planning process, led by the attending physician.  Recommendations may be updated based on patient status, additional functional criteria and insurance authorization.  Follow Up Recommendations  No PT follow up;Supervision for mobility/OOB;Other (comment) (consider pulm rehab)     Equipment Recommendations  None recommended by PT    Recommendations for Other Services       Precautions / Restrictions Precautions Precautions: Fall Precaution Comments: monitor O2 Restrictions Weight Bearing Restrictions: No     Mobility  Bed Mobility               General bed mobility comments: Pt sitting in recliner upon arrival.    Transfers Overall transfer level:  Independent Equipment used: None Transfers: Sit to/from Stand Sit to Stand: Independent         General transfer comment: Independent with transfer to stand from recliner, no LOB.  Ambulation/Gait Ambulation/Gait assistance: Supervision Gait Distance (Feet): 450 Feet Assistive device: None Gait Pattern/deviations: Step-through pattern;Narrow base of support Gait velocity: controlled, WFL Gait velocity interpretation: >2.62 ft/sec, indicative of community ambulatory General Gait Details: Cues provided for pt to pace self and stand to rest as needed. No LOB, even with changes in head position and speeds. Pt needing x2 standing rest breaks with SpO2 decreasing to as low as 80% on 1-2L of O2, needing several minutes of pursed lip breathing to recover.   Stairs             Wheelchair Mobility    Modified Rankin (Stroke Patients Only)       Balance Overall balance assessment: No apparent balance deficits (not formally assessed) Sitting-balance support: Feet supported Sitting balance-[name] Scale: Good     Standing balance support: No upper extremity supported Standing balance-[name] Scale: Good                              Cognition Arousal/Alertness: Awake/alert Behavior During Therapy: WFL for tasks assessed/performed Overall Cognitive Status: Within Functional Limits for tasks assessed                                 General Comments: pleasant and cooperative      Exercises General Exercises - Lower Extremity Heel Raises: Strengthening;Both;10 reps;Standing (x2 sets, without  UE support) Other Exercises Other Exercises: Bil UE wall push-ups in standing, 3x10 Other Exercises: L UE single arm wall push-ups, 10x    General Comments General comments (skin integrity, edema, etc.): SpO2 decreased to as low as 80% with ambulating on 1-2L of O2, needing several minutes of pursed lip breathing to rebound to 90s%; provided handout and education  on [employer] and progressing his endurance      Pertinent Vitals/Pain Pain Assessment: Faces Faces Pain Scale: Hurts a little bit Pain Location: headache Pain Descriptors / Indicators: Headache Pain Intervention(s): Limited activity within patient's tolerance;Monitored during session;Repositioned    Home Living                      Prior Function            PT Goals (current goals can now be found in the care plan section) Acute Rehab PT Goals Patient Stated Goal: to go home and return to baseline PT Goal Formulation: With patient Time For Goal Achievement: 09/10/21 Potential to Achieve Goals: Good Progress towards PT goals: Progressing toward goals    Frequency    Min 3X/week      PT Plan Current plan remains appropriate    Co-evaluation              AM-PAC PT "6 Clicks" Mobility   Outcome Measure  Help needed turning from your back to your side while in a flat bed without using bedrails?: None Help needed moving from lying on your back to sitting on the side of a flat bed without using bedrails?: None Help needed moving to and from a bed to a chair (including a wheelchair)?: None Help needed standing up from a chair using your arms (e.g., wheelchair or bedside chair)?: None Help needed to walk in hospital room?: A Little Help needed climbing 3-5 steps with a railing? : A Little 6 Click Score: 22    End of Session Equipment Utilized During Treatment: Oxygen Activity Tolerance: Patient tolerated treatment well Patient left: with call bell/phone within reach (standing near recliner)   PT Visit Diagnosis: Other abnormalities of gait and mobility (R26.89);Muscle weakness (generalized) (M62.81)     Time: 7711-6579 PT Time Calculation (min) (ACUTE ONLY): 25 min  Charges:  $Therapeutic Exercise: 8-22 mins $Therapeutic Activity: 8-22 mins                     Moishe Spice, PT, DPT Acute Rehabilitation Services  Pager:  [phone] Office: [phone]    Orvan Falconer 09/02/2021, 3:40 PM

## 2021-09-02 NOTE — Telephone Encounter (Signed)
Call made patient daughter (dpr), confirmed patient DOB. Patient daughter states this patient is in the hospital. She states she had a plan with Dr Loanne Drilling as far as this patient going to the hospital and the plan is not being followed. She would like a call from Dr Loanne Drilling directly. I made her aware JE is not in clinic today but I would send her the message and we would get back with her next week once we spoke with JE. Voiced understanding.   JE please advise patient daughter requesting a call.

## 2021-09-02 NOTE — Care Management (Addendum)
1034 09-02-21 Case Manager called [employer] for updates on the Trilogy Authorization/Estimated delivery and oxygen status. Awaiting updates from the company. Case Manager will continue to follow.    5271 09-02-21 Received Notice from [employer] that the Authorization is still pending for the Trilogy. Oxygen has been approved. Weekend Case manager to follow up. Patient may be in house until Monday.

## 2021-09-02 NOTE — Progress Notes (Signed)
PROGRESS NOTE    [name] [name]  WKM:[id number] DOB: September 07, 1951 DOA: 08/20/2021 PCP: Patient, No Pcp Per (Inactive)   Brief Narrative:  70 year old male who was admitted by the hospitalist service and subsequently transferred to the critical care service for evaluation management of left-sided pleural effusion and empyema.  He initially presented to [facility] ED on 08/18/2021 with complaints of shortness of breath, lower extremity edema and abdominal bloating.  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 CT of the chest demonstrated a large left-sided pleural effusion. IR completed a thoracentesis with 900 mL of thick consistency pleural fluid removed with a thick calcified rind encasing fluid.  He was transferred to the ICU at [facility] given his shock and also for evaluation by cardiothoracic surgery. Pt transferred back to the [facility] service on 08/25/2021. Noted to have dysphagia, GI was consulted and he underwent an EGD. On ambulation patient desaturated to 81% still require supplemental oxygen at home.  Pulmonary recommended trilogy vent at home, still awaiting device.   Assessment & Plan:   Active Problems:   Pleural effusion   Shock ([place])   Acute respiratory failure with hypoxia and hypercapnia (HCC)   Hyponatremia  Acute on chronic respiratory failure with hypoxia and hypercapnia in the setting of pleural effusion and COPD Currently requiring about 3L of O2, bipap nightly and prn Patient has severe emphysema and has a history of squamous cell carcinoma status post left pneumonectomy in 2005 Pulmonary has been consulted recommending continue BiPAP as needed and nightly Status Post thoracentesis x2 Repeat CXR done on 08/31/21 unchanged from previous as pt continues to c/o subjective SOB (discussed with Dr Tamala Julian  PCCM on 09/01/21 about latest CXR findings, no further recommendation except for trilogy Awaiting Trilogy insurance authorization and delivery of equipment  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  In the setting of COPD Echocardiogram was consistent with moderate pulmonary hypertension with very mild right ventricular strain Underwent a right heart cath to quantify pulm hypertension and evaluate his volume status Cardiology recommending appropriate oxygenation optimizing volume status Restart lasix  Hypotension in the setting of shock Resolved Initially required Levophed but now resolved D/c midodrine as BP now elevated  Hyperlipidemia Continue Statin  Hypertension Now uncontrolled Restart coreg, lisinopril (at a lower dose and titrate to home dose) Hold HCTZ  Normocytic Anemia  Anemia Panel showed an iron level of 94, U IBC 389, TIBC 43, saturation ratios of 19%, ferritin of 72, folate of 15.4, vitamin B12 482 Continue to Monitor for S/Sx of Bleeding; No overt Bleeding noted  Unintentional weight loss and dysphagia Esophageal stricture Had a prior work-up back in 2022 with a CT abdomen pelvis was negative for structural issues Had SLP on 9/4 and an esophagram on 9/8 which showed no specific stricture but barium pill did not pass of the stomach raising question of GE junction narrowing Patient underwent EGD with dilatation of the esophagus, GI recommending PPI and US abdomen which was negative for cholelithiasis but did show a small liver simple cyst L is 1 x 1 x 1.2 cm Plan for Colonoscopy in the outpatient setting    DVT prophylaxis: Enoxaparin 40 mg subcu q. 24 Code Status: FULL CODE  Family Communication: No family present at bedside Disposition Plan: patient will need oxygen for discharge and Trilogy Vent for D/C and  currently trilogy vent is still pending to be delivered  Status is: Inpatient  Remains inpatient appropriate because:Unsafe d/c plan, IV  treatments appropriate due to intensity of illness or inability to take PO, and Inpatient level of care appropriate due to severity of illness  Dispo: The patient is from: Home              Anticipated d/c is to: Home              Patient currently is not medically stable to d/c.   Difficult to place patient No  Consultants:  Cardiovascular surgery PCCM Cardiology  Procedures:  9/2 Thora with 900cc removed, new levophed requirement, transfer to [facility] 9/6 Thora with 500cc removed  RHC 9/7 with mild to mod PAH, normal left sided pressures.   EGD    Antimicrobials:  None    Subjective: Still with abdominal pain, noted to be constipated on abd xray. Denies any N/V, chest pain. Still subjectively SOB. Started bowel regimen.   Objective: Vitals:   09/02/21 0729 09/02/21 0847 09/02/21 1305 09/02/21 1528  BP:  (!) 169/101 136/81 (!) 167/102  Pulse: 83 85 88   Resp: 16  18   Temp:   98 F (36.7 C)   TempSrc:   Oral   SpO2: 100%     Weight:      Height:        Intake/Output Summary (Last 24 hours) at 09/02/2021 1639 Last data filed at 09/02/2021 1425 Gross per 24 hour  Intake 420 ml  Output 1850 ml  Net -1430 ml   Filed Weights   09/01/21 0600 09/01/21 0700 09/02/21 0500  Weight: 58.6 kg 58.6 kg 58 kg   Examination: Physical Exam: General: NAD, chronically ill appearing  Cardiovascular: S1, S2 present Respiratory: Diminished BS b/l Abdomen: Soft, nontender, nondistended, bowel sounds present Musculoskeletal: +bilateral pedal edema noted Skin: Normal Psychiatry: Normal mood     Data Reviewed: I have personally reviewed following labs and imaging studies  CBC: Recent Labs  Lab 08/27/21 0256 08/28/21 0115 08/29/21 0434 08/30/21 0201 09/02/21 0212  WBC 4.4 4.8 4.0 4.2 4.6  NEUTROABS 3.1 3.2 2.4 2.6 3.0  HGB 9.8* 9.4* 9.5* 9.1* 9.3*  HCT 31.6* 31.1* 30.3* 29.4* 29.8*  MCV 92.1 93.1 92.1 93.0 92.0  PLT 159 156 172 183 638   Basic Metabolic Panel: Recent  Labs  Lab 08/27/21 0256 08/28/21 0115 08/29/21 0434 08/30/21 0201 08/31/21 0431 09/02/21 0212  NA 137 137 138 136  --  137  K 4.3 4.2 3.9 4.0  --  3.9  CL 94* 95* 94* 94*  --  93*  CO2 40* 39* 35* 36*  --  34*  GLUCOSE 82 89 67* 82  --  79  BUN _0 --  11  CREATININE 0.98 0.94 0.79 0.86 0.82 0.86  CALCIUM 8.7* 8.7* 9.0 8.8*  --  9.4  MG 1.7 2.0 1.9 1.9  --   --   PHOS 3.7 3.1 3.6 3.6  --   --    GFR: Estimated Creatinine Clearance: 68.4 mL/min (by C-G formula based on SCr of 0.86 mg/dL). Liver Function Tests: Recent Labs  Lab 08/27/21 0256 08/28/21 0115 08/29/21 0434 08/30/21 0201  AST _1 ALT _2 ALKPHOS 39 40 40 39  BILITOT 0.4 0.5 0.7 0.4  PROT 5.5* 5.3* 5.3* 5.4*  ALBUMIN 3.0* 2.9* 2.8* 2.9*   No results for input(s): LIPASE, AMYLASE  in the last 168 hours. No results for input(s): AMMONIA in the last 168 hours. Coagulation Profile: No results for input(s): INR, PROTIME in the last 168 hours. Cardiac Enzymes: No results for input(s): CKTOTAL, CKMB, CKMBINDEX, TROPONINI in the last 168 hours. BNP (last 3 results) No results for input(s): PROBNP in the last 8760 hours. HbA1C: No results for input(s): HGBA1C in the last 72 hours.  CBG: Recent Labs  Lab 09/01/21 1205 09/01/21 1614 09/01/21 2204 09/02/21 0752 09/02/21 1103  GLUCAP 190* 66* 131* 79 122*   Lipid Profile: No results for input(s): CHOL, HDL, LDLCALC, TRIG, CHOLHDL, LDLDIRECT in the last 72 hours. Thyroid Function Tests: No results for input(s): TSH, T4TOTAL, FREET4, T3FREE, THYROIDAB in the last 72 hours. Anemia Panel: No results for input(s): VITAMINB12, FOLATE, FERRITIN, TIBC, IRON, RETICCTPCT in the last 72 hours.  Sepsis Labs: No results for input(s): PROCALCITON, LATICACIDVEN in the last 168 hours.  No results found for this or any previous visit (from the past 240 hour(s)).  RN Pressure Injury Documentation:     Estimated body mass index is 18.88 kg/m  as calculated from the following:   Height as of this encounter: _0  (1.753 m).   Weight as of this encounter: 58 kg.  Malnutrition Type:    Malnutrition Characteristics:   Nutrition Interventions:     Radiology Studies: DG Abd Portable 1V  Result Date: 09/01/2021 CLINICAL DATA:  Abdominal pain and constipation EXAM: PORTABLE ABDOMEN - 1 VIEW COMPARISON:  None. FINDINGS: No sign of bowel obstruction. There is a moderate amount of fecal matter and gas within the colon. The small bowel pattern is normal. No abnormal calcifications or bone findings. IMPRESSION: Moderate amount of fecal matter and gas within the colon which could go along with the clinical history of constipation. Electronically Signed   By: Nelson Chimes M.D.   On: 09/01/2021 18:56    Scheduled Meds:  aspirin  81 mg Oral Daily   carvedilol  3.125 mg Oral BID WC   enoxaparin (LOVENOX) injection  40 mg Subcutaneous Q24H   furosemide  40 mg Intravenous Daily   influenza vaccine adjuvanted  0.5 mL Intramuscular Tomorrow-1000   lisinopril  10 mg Oral Daily   mouth rinse  15 mL Mouth Rinse BID   pantoprazole  40 mg Oral Daily   polyethylene glycol  17 g Oral Daily   senna-docusate  1 tablet Oral BID   simvastatin  20 mg Oral QHS   sodium chloride flush  3 mL Intravenous Q12H   umeclidinium bromide  1 puff Inhalation Daily   Continuous Infusions:  sodium chloride      LOS: 13 days   Alma Friendly, MD [facility] PAGER is on AMION  If 7PM-7AM, please contact night-coverage [email or web address]

## 2021-09-03 LAB — BASIC METABOLIC PANEL
Anion gap: 8 (ref 5–15)
BUN: 13 mg/dL (ref 8–23)
CO2: 37 mmol/L — ABNORMAL HIGH (ref 22–32)
Calcium: 9 mg/dL (ref 8.9–10.3)
Chloride: 92 mmol/L — ABNORMAL LOW (ref 98–111)
Creatinine, Ser: 1.03 mg/dL (ref 0.61–1.24)
GFR, Estimated: >60 mL/min (ref >60)
Glucose, Bld: 78 mg/dL (ref 70–99)
Potassium: 3.7 mmol/L (ref 3.5–5.1)
Sodium: 137 mmol/L (ref 135–145)

## 2021-09-03 LAB — GLUCOSE, CAPILLARY
Glucose-Capillary: 145 mg/dL — ABNORMAL HIGH (ref 70–99)
Glucose-Capillary: 77 mg/dL (ref 70–99)

## 2021-09-03 MED ORDER — FUROSEMIDE 40 MG PO TABS
40.0000 mg | ORAL_TABLET | Freq: Every day | ORAL | Status: DC
  Administered 2021-09-03 – 2021-09-16 (×14): 40 mg via ORAL
  Filled 2021-09-03 (×14): qty 1

## 2021-09-03 NOTE — Progress Notes (Signed)
PROGRESS NOTE    [name] [name]  NTI:[id number] DOB: 10/17/1951 DOA: 08/20/2021 PCP: Patient, No Pcp Per (Inactive)   Brief Narrative:  70 year old male who was admitted by the hospitalist service and subsequently transferred to the critical care service for evaluation management of left-sided pleural effusion and empyema.  He initially presented to [facility] ED on 08/18/2021 with complaints of shortness of breath, lower extremity edema and abdominal bloating.  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 CT of the chest demonstrated a large left-sided pleural effusion. IR completed a thoracentesis with 900 mL of thick consistency pleural fluid removed with a thick calcified rind encasing fluid.  He was transferred to the ICU at [facility] given his shock and also for evaluation by cardiothoracic surgery. Pt transferred back to the [facility] service on 08/25/2021. Noted to have dysphagia, GI was consulted and he underwent an EGD. On ambulation patient desaturated to 81% still require supplemental oxygen at home.  Pulmonary recommended trilogy vent at home, still awaiting device.   Assessment & Plan:   Active Problems:   Pleural effusion   Shock ([place])   Acute respiratory failure with hypoxia and hypercapnia (HCC)   Hyponatremia  Acute on chronic respiratory failure with hypoxia and hypercapnia in the setting of pleural effusion and COPD Currently requiring about 3L of O2, bipap nightly and prn Patient has severe emphysema and has a history of squamous cell carcinoma status post left pneumonectomy in 2005 Pulmonary has been consulted recommending continue BiPAP as needed and nightly Status Post thoracentesis x2 Repeat CXR done on 08/31/21 unchanged from previous as pt continues to c/o subjective SOB (discussed with Dr Tamala Julian  PCCM on 09/01/21 about latest CXR findings, no further recommendation except for trilogy Awaiting Trilogy insurance authorization and delivery of equipment  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  In the setting of COPD Echocardiogram was consistent with moderate pulmonary hypertension with very mild right ventricular strain Underwent a right heart cath to quantify pulm hypertension and evaluate his volume status Cardiology recommending appropriate oxygenation optimizing volume status Restart lasix  Hypotension in the setting of shock Resolved Initially required Levophed but now resolved D/c midodrine as BP now elevated  Hyperlipidemia Continue Statin  Hypertension Now uncontrolled Restart coreg, lisinopril (at a lower dose and titrate to home dose) Hold HCTZ  Normocytic Anemia  Anemia Panel showed an iron level of 94, U IBC 389, TIBC 43, saturation ratios of 19%, ferritin of 72, folate of 15.4, vitamin B12 482 Continue to Monitor for S/Sx of Bleeding; No overt Bleeding noted  Unintentional weight loss and dysphagia Esophageal stricture Had a prior work-up back in 2022 with a CT abdomen pelvis was negative for structural issues Had SLP on 9/4 and an esophagram on 9/8 which showed no specific stricture but barium pill did not pass of the stomach raising question of GE junction narrowing Patient underwent EGD with dilatation of the esophagus, GI recommending PPI and US abdomen which was negative for cholelithiasis but did show a small liver simple cyst L is 1 x 1 x 1.2 cm Plan for Colonoscopy in the outpatient setting    DVT prophylaxis: Enoxaparin 40 mg subcu q. 24 Code Status: FULL CODE  Family Communication: Discussed with daughter on 09/02/21 Disposition Plan: patient will need oxygen for discharge and Trilogy Vent for D/C and  currently trilogy vent is still pending to be delivered  Status is: Inpatient  Remains inpatient appropriate because:Unsafe d/c  plan, IV treatments appropriate due to intensity of illness or inability to take PO, and Inpatient level of care appropriate due to severity of illness  Dispo: The patient is from: Home              Anticipated d/c is to: Home              Patient currently is not medically stable to d/c.   Difficult to place patient No  Consultants:  Cardiovascular surgery PCCM Cardiology  Procedures:  9/2 Thora with 900cc removed, new levophed requirement, transfer to [facility] 9/6 Thora with 500cc removed  RHC 9/7 with mild to mod PAH, normal left sided pressures.   EGD    Antimicrobials:  None    Subjective: Today, still with some abdominal discomfort, otherwise stable.   Objective: Vitals:   09/03/21 0005 09/03/21 0420 09/03/21 0655 09/03/21 0847  BP:   98/65 130/82  Pulse: 88 72 73 84  Resp: _0 Temp:   97.8 F (36.6 C)   TempSrc:   Oral   SpO2: 100% 100% 100% 100%  Weight:   54 kg   Height:        Intake/Output Summary (Last 24 hours) at 09/03/2021 1423 Last data filed at 09/03/2021 1000 Gross per 24 hour  Intake 240 ml  Output 1250 ml  Net -1010 ml   Filed Weights   09/01/21 0700 09/02/21 0500 09/03/21 0655  Weight: 58.6 kg 58 kg 54 kg   Examination: Physical Exam: General: NAD, chronically ill appearing  Cardiovascular: S1, S2 present Respiratory: Diminished BS b/l Abdomen: Soft, nontender, nondistended, bowel sounds present Musculoskeletal: No bilateral pedal edema noted Skin: Normal Psychiatry: Normal mood     Data Reviewed: I have personally reviewed following labs and imaging studies  CBC: Recent Labs  Lab 08/28/21 0115 08/29/21 0434 08/30/21 0201 09/02/21 0212  WBC 4.8 4.0 4.2 4.6  NEUTROABS 3.2 2.4 2.6 3.0  HGB 9.4* 9.5* 9.1* 9.3*  HCT 31.1* 30.3* 29.4* 29.8*  MCV 93.1 92.1 93.0 92.0  PLT 156 172 183 778   Basic Metabolic Panel: Recent Labs  Lab 08/28/21 0115 08/29/21 0434 08/30/21 0201 08/31/21 0431 09/02/21 0212 09/03/21 0357   NA 137 138 136  --  137 137  K 4.2 3.9 4.0  --  3.9 3.7  CL 95* 94* 94*  --  93* 92*  CO2 39* 35* 36*  --  34* 37*  GLUCOSE 89 67* 82  --  79 78  BUN _1 --  11 13  CREATININE 0.94 0.79 0.86 0.82 0.86 1.03  CALCIUM 8.7* 9.0 8.8*  --  9.4 9.0  MG 2.0 1.9 1.9  --   --   --   PHOS 3.1 3.6 3.6  --   --   --    GFR: Estimated Creatinine Clearance: 53.2 mL/min (by C-G formula based on SCr of 1.03 mg/dL). Liver Function Tests: Recent Labs  Lab 08/28/21 0115 08/29/21 0434 08/30/21 0201  AST _2 ALT _3 ALKPHOS 40 40 39  BILITOT 0.5 0.7 0.4  PROT 5.3* 5.3* 5.4*  ALBUMIN 2.9* 2.8* 2.9*   No results for input(s): LIPASE, AMYLASE in the last 168 hours. No results for input(s): AMMONIA in the last 168 hours. Coagulation Profile: No results for input(s): INR, PROTIME in the last  168 hours. Cardiac Enzymes: No results for input(s): CKTOTAL, CKMB, CKMBINDEX, TROPONINI in the last 168 hours. BNP (last 3 results) No results for input(s): PROBNP in the last 8760 hours. HbA1C: No results for input(s): HGBA1C in the last 72 hours.  CBG: Recent Labs  Lab 09/01/21 2204 09/02/21 0752 09/02/21 1103 09/03/21 0147 09/03/21 1123  GLUCAP 131* 79 122* 145* 77   Lipid Profile: No results for input(s): CHOL, HDL, LDLCALC, TRIG, CHOLHDL, LDLDIRECT in the last 72 hours. Thyroid Function Tests: No results for input(s): TSH, T4TOTAL, FREET4, T3FREE, THYROIDAB in the last 72 hours. Anemia Panel: No results for input(s): VITAMINB12, FOLATE, FERRITIN, TIBC, IRON, RETICCTPCT in the last 72 hours.  Sepsis Labs: No results for input(s): PROCALCITON, LATICACIDVEN in the last 168 hours.  No results found for this or any previous visit (from the past 240 hour(s)).  RN Pressure Injury Documentation:     Estimated body mass index is 17.59 kg/m as calculated from the following:   Height as of this encounter: _0  (1.753 m).   Weight as of this encounter: 54 kg.  Malnutrition  Type:    Malnutrition Characteristics:   Nutrition Interventions:     Radiology Studies: DG Abd Portable 1V  Result Date: 09/01/2021 CLINICAL DATA:  Abdominal pain and constipation EXAM: PORTABLE ABDOMEN - 1 VIEW COMPARISON:  None. FINDINGS: No sign of bowel obstruction. There is a moderate amount of fecal matter and gas within the colon. The small bowel pattern is normal. No abnormal calcifications or bone findings. IMPRESSION: Moderate amount of fecal matter and gas within the colon which could go along with the clinical history of constipation. Electronically Signed   By: Nelson Chimes M.D.   On: 09/01/2021 18:56    Scheduled Meds:  aspirin  81 mg Oral Daily   carvedilol  3.125 mg Oral BID WC   enoxaparin (LOVENOX) injection  40 mg Subcutaneous Q24H   furosemide  40 mg Oral Daily   influenza vaccine adjuvanted  0.5 mL Intramuscular Tomorrow-1000   lisinopril  10 mg Oral Daily   mouth rinse  15 mL Mouth Rinse BID   pantoprazole  40 mg Oral Daily   polyethylene glycol  17 g Oral Daily   senna-docusate  1 tablet Oral BID   simvastatin  20 mg Oral QHS   sodium chloride flush  3 mL Intravenous Q12H   umeclidinium bromide  1 puff Inhalation Daily   Continuous Infusions:  sodium chloride      LOS: 14 days   Alma Friendly, MD [facility] PAGER is on AMION  If 7PM-7AM, please contact night-coverage [email or web address]

## 2021-09-04 ENCOUNTER — Inpatient Hospital Stay (HOSPITAL_COMMUNITY): Payer: Medicare Other

## 2021-09-04 LAB — BASIC METABOLIC PANEL
Anion gap: 6 (ref 5–15)
BUN: 11 mg/dL (ref 8–23)
CO2: 38 mmol/L — ABNORMAL HIGH (ref 22–32)
Calcium: 9 mg/dL (ref 8.9–10.3)
Chloride: 94 mmol/L — ABNORMAL LOW (ref 98–111)
Creatinine, Ser: 1 mg/dL (ref 0.61–1.24)
GFR, Estimated: >60 mL/min (ref >60)
Glucose, Bld: 77 mg/dL (ref 70–99)
Potassium: 3.6 mmol/L (ref 3.5–5.1)
Sodium: 138 mmol/L (ref 135–145)

## 2021-09-04 LAB — GLUCOSE, CAPILLARY: Glucose-Capillary: 88 mg/dL (ref 70–99)

## 2021-09-04 MED ORDER — IOHEXOL 9 MG/ML PO SOLN
ORAL | Status: AC
  Administered 2021-09-04: 500 mL
  Filled 2021-09-04: qty 1000

## 2021-09-04 MED ORDER — LISINOPRIL 5 MG PO TABS
5.0000 mg | ORAL_TABLET | Freq: Every day | ORAL | Status: DC
  Administered 2021-09-05 – 2021-09-16 (×12): 5 mg via ORAL
  Filled 2021-09-04 (×12): qty 1

## 2021-09-04 MED ORDER — SIMETHICONE 80 MG PO CHEW
80.0000 mg | CHEWABLE_TABLET | Freq: Four times a day (QID) | ORAL | Status: DC | PRN
  Administered 2021-09-04 – 2021-09-15 (×17): 80 mg via ORAL
  Filled 2021-09-04 (×18): qty 1

## 2021-09-04 MED ORDER — IOHEXOL 300 MG/ML  SOLN
100.0000 mL | Freq: Once | INTRAMUSCULAR | Status: AC | PRN
  Administered 2021-09-04: 100 mL via INTRAVENOUS

## 2021-09-04 NOTE — Progress Notes (Signed)
PROGRESS NOTE    [name] [name]  NTI:[id number] DOB: 10/17/1951 DOA: 08/20/2021 PCP: Patient, No Pcp Per (Inactive)   Brief Narrative:  70 year old male who was admitted by the hospitalist service and subsequently transferred to the critical care service for evaluation management of left-sided pleural effusion and empyema.  He initially presented to [facility] ED on 08/18/2021 with complaints of shortness of breath, lower extremity edema and abdominal bloating.  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 CT of the chest demonstrated a large left-sided pleural effusion. IR completed a thoracentesis with 900 mL of thick consistency pleural fluid removed with a thick calcified rind encasing fluid.  He was transferred to the ICU at [facility] given his shock and also for evaluation by cardiothoracic surgery. Pt transferred back to the [facility] service on 08/25/2021. Noted to have dysphagia, GI was consulted and he underwent an EGD. On ambulation patient desaturated to 81% still require supplemental oxygen at home.  Pulmonary recommended trilogy vent at home, still awaiting device.   Assessment & Plan:   Active Problems:   Pleural effusion   Shock ([place])   Acute respiratory failure with hypoxia and hypercapnia (HCC)   Hyponatremia  Acute on chronic respiratory failure with hypoxia and hypercapnia in the setting of pleural effusion and COPD Currently requiring about 3L of O2, bipap nightly and prn Patient has severe emphysema and has a history of squamous cell carcinoma status post left pneumonectomy in 2005 Pulmonary has been consulted recommending continue BiPAP as needed and nightly Status Post thoracentesis x2 Repeat CXR done on 08/31/21 unchanged from previous as pt continues to c/o subjective SOB (discussed with Dr Tamala Julian  PCCM on 09/01/21 about latest CXR findings, no further recommendation except for trilogy Awaiting Trilogy insurance authorization and delivery of equipment  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  In the setting of COPD Echocardiogram was consistent with moderate pulmonary hypertension with very mild right ventricular strain Underwent a right heart cath to quantify pulm hypertension and evaluate his volume status Cardiology recommending appropriate oxygenation optimizing volume status Restart lasix  Hypotension in the setting of shock Resolved Initially required Levophed but now resolved D/c midodrine as BP now elevated  Hyperlipidemia Continue Statin  Hypertension Now uncontrolled Restart coreg, lisinopril (at a lower dose and titrate to home dose) Hold HCTZ  Normocytic Anemia  Anemia Panel showed an iron level of 94, U IBC 389, TIBC 43, saturation ratios of 19%, ferritin of 72, folate of 15.4, vitamin B12 482 Continue to Monitor for S/Sx of Bleeding; No overt Bleeding noted  Unintentional weight loss and dysphagia Esophageal stricture Had a prior work-up back in 2022 with a CT abdomen pelvis was negative for structural issues Had SLP on 9/4 and an esophagram on 9/8 which showed no specific stricture but barium pill did not pass of the stomach raising question of GE junction narrowing Patient underwent EGD with dilatation of the esophagus, GI recommending PPI and US abdomen which was negative for cholelithiasis but did show a small liver simple cyst L is 1 x 1 x 1.2 cm Plan for Colonoscopy in the outpatient setting    DVT prophylaxis: Enoxaparin 40 mg subcu q. 24 Code Status: FULL CODE  Family Communication: Discussed with daughter on 09/02/21 Disposition Plan: patient will need oxygen for discharge and Trilogy Vent for D/C and  currently trilogy vent is still pending to be delivered  Status is: Inpatient  Remains inpatient appropriate because:Unsafe d/c  plan, IV treatments appropriate due to intensity of illness or inability to take PO, and Inpatient level of care appropriate due to severity of illness  Dispo: The patient is from: Home              Anticipated d/c is to: Home              Patient currently is not medically stable to d/c.   Difficult to place patient No  Consultants:  Cardiovascular surgery PCCM Cardiology  Procedures:  9/2 Thora with 900cc removed, new levophed requirement, transfer to [facility] 9/6 Thora with 500cc removed  RHC 9/7 with mild to mod PAH, normal left sided pressures.   EGD    Antimicrobials:  None    Subjective: Today, pt reports feeling gassy. Has been having BM (noted to be constipated on xray)   Objective: Vitals:   09/04/21 0838 09/04/21 0903 09/04/21 0926 09/04/21 1336  BP: 114/80  117/77 96/62  Pulse: 80  78 88  Resp:    16  Temp:   98.5 F (36.9 C) 97.8 F (36.6 C)  TempSrc:   Oral Oral  SpO2:  100% 100% 99%  Weight:      Height:        Intake/Output Summary (Last 24 hours) at 09/04/2021 1346 Last data filed at 09/04/2021 1337 Gross per 24 hour  Intake 240 ml  Output 1101 ml  Net -861 ml   Filed Weights   09/02/21 0500 09/03/21 0655 09/04/21 0508  Weight: 58 kg 54 kg 54 kg   Examination: Physical Exam: General: NAD, chronically ill appearing  Cardiovascular: S1, S2 present Respiratory: Diminished BS b/l Abdomen: Soft, nontender, nondistended, bowel sounds present Musculoskeletal: No bilateral pedal edema noted Skin: Normal Psychiatry: Normal mood     Data Reviewed: I have personally reviewed following labs and imaging studies  CBC: Recent Labs  Lab 08/29/21 0434 08/30/21 0201 09/02/21 0212  WBC 4.0 4.2 4.6  NEUTROABS 2.4 2.6 3.0  HGB 9.5* 9.1* 9.3*  HCT 30.3* 29.4* 29.8*  MCV 92.1 93.0 92.0  PLT 172 183 002   Basic Metabolic Panel: Recent Labs  Lab 08/29/21 0434 08/30/21 0201 08/31/21 0431 09/02/21 0212 09/03/21 0357 09/04/21 0339  NA 138 136   --  137 137 138  K 3.9 4.0  --  3.9 3.7 3.6  CL 94* 94*  --  93* 92* 94*  CO2 35* 36*  --  34* 37* 38*  GLUCOSE 67* 82  --  79 78 77  BUN 12 12  --  _0 CREATININE 0.79 0.86 0.82 0.86 1.03 1.00  CALCIUM 9.0 8.8*  --  9.4 9.0 9.0  MG 1.9 1.9  --   --   --   --   PHOS 3.6 3.6  --   --   --   --    GFR: Estimated Creatinine Clearance: 54.8 mL/min (by C-G formula based on SCr of 1 mg/dL). Liver Function Tests: Recent Labs  Lab 08/29/21 0434 08/30/21 0201  AST 19 21  ALT 17 17  ALKPHOS 40 39  BILITOT 0.7 0.4  PROT 5.3* 5.4*  ALBUMIN 2.8* 2.9*   No results for input(s): LIPASE, AMYLASE in the last 168 hours. No results for input(s): AMMONIA in the last 168 hours. Coagulation Profile: No results for input(s): INR, PROTIME in the last 168 hours. Cardiac  Enzymes: No results for input(s): CKTOTAL, CKMB, CKMBINDEX, TROPONINI in the last 168 hours. BNP (last 3 results) No results for input(s): PROBNP in the last 8760 hours. HbA1C: No results for input(s): HGBA1C in the last 72 hours.  CBG: Recent Labs  Lab 09/02/21 0752 09/02/21 1103 09/03/21 0147 09/03/21 1123 09/04/21 0819  GLUCAP 79 122* 145* 77 88   Lipid Profile: No results for input(s): CHOL, HDL, LDLCALC, TRIG, CHOLHDL, LDLDIRECT in the last 72 hours. Thyroid Function Tests: No results for input(s): TSH, T4TOTAL, FREET4, T3FREE, THYROIDAB in the last 72 hours. Anemia Panel: No results for input(s): VITAMINB12, FOLATE, FERRITIN, TIBC, IRON, RETICCTPCT in the last 72 hours.  Sepsis Labs: No results for input(s): PROCALCITON, LATICACIDVEN in the last 168 hours.  No results found for this or any previous visit (from the past 240 hour(s)).  RN Pressure Injury Documentation:     Estimated body mass index is 17.57 kg/m as calculated from the following:   Height as of this encounter: 5' 9" (1.753 m).   Weight as of this encounter: 54 kg.  Malnutrition Type:    Malnutrition Characteristics:   Nutrition  Interventions:     Radiology Studies: No results found.  Scheduled Meds:  aspirin  81 mg Oral Daily   carvedilol  3.125 mg Oral BID WC   enoxaparin (LOVENOX) injection  40 mg Subcutaneous Q24H   furosemide  40 mg Oral Daily   influenza vaccine adjuvanted  0.5 mL Intramuscular Tomorrow-1000   lisinopril  10 mg Oral Daily   mouth rinse  15 mL Mouth Rinse BID   pantoprazole  40 mg Oral Daily   polyethylene glycol  17 g Oral Daily   senna-docusate  1 tablet Oral BID   simvastatin  20 mg Oral QHS   sodium chloride flush  3 mL Intravenous Q12H   umeclidinium bromide  1 puff Inhalation Daily   Continuous Infusions:  sodium chloride      LOS: 15 days   Alma Friendly, MD [facility] PAGER is on AMION  If 7PM-7AM, please contact night-coverage [email or web address]

## 2021-09-05 LAB — BASIC METABOLIC PANEL
Anion gap: 5 (ref 5–15)
BUN: 8 mg/dL (ref 8–23)
CO2: 36 mmol/L — ABNORMAL HIGH (ref 22–32)
Calcium: 8.7 mg/dL — ABNORMAL LOW (ref 8.9–10.3)
Chloride: 97 mmol/L — ABNORMAL LOW (ref 98–111)
Creatinine, Ser: 0.87 mg/dL (ref 0.61–1.24)
GFR, Estimated: >60 mL/min (ref >60)
Glucose, Bld: 81 mg/dL (ref 70–99)
Potassium: 3.9 mmol/L (ref 3.5–5.1)
Sodium: 138 mmol/L (ref 135–145)

## 2021-09-05 LAB — CBC WITH DIFFERENTIAL/PLATELET
Abs Immature Granulocytes: 0.01 10*3/uL (ref 0.00–0.07)
Basophils Absolute: 0 10*3/uL (ref 0.0–0.1)
Basophils Relative: 1 %
Eosinophils Absolute: 0.1 10*3/uL (ref 0.0–0.5)
Eosinophils Relative: 2 %
HCT: 31.3 % — ABNORMAL LOW (ref 39.0–52.0)
Hemoglobin: 9.6 g/dL — ABNORMAL LOW (ref 13.0–17.0)
Immature Granulocytes: 0 %
Lymphocytes Relative: 20 %
Lymphs Abs: 0.9 10*3/uL (ref 0.7–4.0)
MCH: 28.4 pg (ref 26.0–34.0)
MCHC: 30.7 g/dL (ref 30.0–36.0)
MCV: 92.6 fL (ref 80.0–100.0)
Monocytes Absolute: 0.4 10*3/uL (ref 0.1–1.0)
Monocytes Relative: 9 %
Neutro Abs: 3.1 10*3/uL (ref 1.7–7.7)
Neutrophils Relative %: 68 %
Platelets: 263 10*3/uL (ref 150–400)
RBC: 3.38 MIL/uL — ABNORMAL LOW (ref 4.22–5.81)
RDW: 13.2 % (ref 11.5–15.5)
WBC: 4.6 10*3/uL (ref 4.0–10.5)
nRBC: 0 % (ref 0.0–0.2)

## 2021-09-05 LAB — TROPONIN I (HIGH SENSITIVITY)
Troponin I (High Sensitivity): 7 ng/L (ref <18)
Troponin I (High Sensitivity): 7 ng/L (ref <18)

## 2021-09-05 NOTE — TOC CM/SW Note (Addendum)
1202 HF TOC CM received copy of original order submitted to [facility] Medicare by [place] and request to expedite was on original form on 08/29/2021. [place] Medicare with information and stated she would expedite, ref #[id number]. Jonnie Finner RN3 CCM, Heart Failure TOC CM [phone]   962 am HF TOC CM contacted [facility] Medicare to follow up on auth for Trilogy, ref [id number]. Expedited request was done on 08/31/2021. States Josem Kaufmann is pending and expedited request has to be completed at time of original order. Will send information to clinical team that patient is in hospital waiting Trilogy/NIV delivery prior to dc. [place], Heart Failure TOC CM [phone]

## 2021-09-05 NOTE — Progress Notes (Signed)
PROGRESS NOTE    [name] [name]  YIR:[id number] DOB: 09/05/1951 DOA: 08/20/2021 PCP: Patient, No Pcp Per (Inactive)   Brief Narrative:  70 year old male who was admitted by the hospitalist service and subsequently transferred to the critical care service for evaluation management of left-sided pleural effusion and empyema.  He initially presented to [facility] ED on 08/18/2021 with complaints of shortness of breath, lower extremity edema and abdominal bloating.  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 CT of the chest demonstrated a large left-sided pleural effusion. IR completed a thoracentesis with 900 mL of thick consistency pleural fluid removed with a thick calcified rind encasing fluid.  He was transferred to the ICU at [facility] given his shock and also for evaluation by cardiothoracic surgery. Pt transferred back to the [facility] service on 08/25/2021. Noted to have dysphagia, GI was consulted and he underwent an EGD. On ambulation patient desaturated to 81% still require supplemental oxygen at home.  Pulmonary recommended trilogy vent at home, still awaiting device.   Assessment & Plan:   Active Problems:   Pleural effusion   Shock ([place])   Acute respiratory failure with hypoxia and hypercapnia (HCC)   Hyponatremia  Acute on chronic respiratory failure with hypoxia and hypercapnia in the setting of pleural effusion and COPD Currently requiring about 3L of O2, bipap nightly and prn Patient has severe emphysema and has a history of squamous cell carcinoma status post left pneumonectomy in 2005 Pulmonary has been consulted recommending continue BiPAP as needed and nightly Status Post thoracentesis x2 Repeat CXR done on 08/31/21 unchanged from previous as pt continues to c/o subjective SOB (discussed with Dr Tamala Julian  PCCM on 09/01/21 about latest CXR findings, no further recommendation except for trilogy Awaiting Trilogy insurance authorization and delivery of equipment  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  In the setting of COPD Echocardiogram was consistent with moderate pulmonary hypertension with very mild right ventricular strain Underwent a right heart cath to quantify pulm hypertension and evaluate his volume status Cardiology recommending appropriate oxygenation optimizing volume status Restart lasix  Hypotension in the setting of shock Resolved Initially required Levophed but now resolved D/c midodrine as BP now elevated  Hyperlipidemia Continue Statin  Hypertension Now uncontrolled Restart coreg, lisinopril (at a lower dose and titrate to home dose) Hold HCTZ  Normocytic Anemia  Anemia Panel showed an iron level of 94, U IBC 389, TIBC 43, saturation ratios of 19%, ferritin of 72, folate of 15.4, vitamin B12 482 Continue to Monitor for S/Sx of Bleeding; No overt Bleeding noted  Generalized abdominal pain Constipation Non-specific Abd xray with mod amount of stool with gas CT abdomen/pelvis with no acute intra-abdominal pathology Bowel regimen  Unintentional weight loss and dysphagia Esophageal stricture Had a prior work-up back in 2022 with a CT abdomen pelvis was negative for structural issues Had SLP on 9/4 and an esophagram on 9/8 which showed no specific stricture but barium pill did not pass of the stomach raising question of GE junction narrowing Patient underwent EGD with dilatation of the esophagus, GI recommending PPI and US abdomen which was negative for cholelithiasis but did show a small liver simple cyst L is 1 x 1 x 1.2 cm Plan for Colonoscopy in the outpatient setting    DVT prophylaxis: Enoxaparin 40 mg subcu q. 24 Code Status:  FULL CODE  Family Communication: Discussed with daughter on 09/02/21 Disposition Plan: patient will need oxygen  for discharge and Trilogy Vent for D/C and currently trilogy vent is still pending to be delivered  Status is: Inpatient  Remains inpatient appropriate because:Unsafe d/c plan, IV treatments appropriate due to intensity of illness or inability to take PO, and Inpatient level of care appropriate due to severity of illness  Dispo: The patient is from: Home              Anticipated d/c is to: Home              Patient currently is not medically stable to d/c.   Difficult to place patient No  Consultants:  Cardiovascular surgery PCCM Cardiology  Procedures:  9/2 Thora with 900cc removed, new levophed requirement, transfer to [facility] 9/6 Thora with 500cc removed  RHC 9/7 with mild to mod PAH, normal left sided pressures.   EGD    Antimicrobials:  None    Subjective: Today, pt denies any new complaints   Objective: Vitals:   09/04/21 1951 09/05/21 0304 09/05/21 0542 09/05/21 0735  BP: 136/78  114/77   Pulse: 94 95 76   Resp: 18  18   Temp: 98.6 F (37 C)  (!) 97.5 F (36.4 C)   TempSrc: Oral  Oral   SpO2: 100% 96% 100% 93%  Weight:   55.3 kg   Height:        Intake/Output Summary (Last 24 hours) at 09/05/2021 1221 Last data filed at 09/04/2021 2141 Gross per 24 hour  Intake 240 ml  Output 600 ml  Net -360 ml   Filed Weights   09/03/21 0655 09/04/21 0508 09/05/21 0542  Weight: 54 kg 54 kg 55.3 kg   Examination:  Physical Exam: General: NAD, chronically ill appearing  Cardiovascular: S1, S2 present Respiratory: Diminished BS b/l Abdomen: Soft, nontender, nondistended, bowel sounds present Musculoskeletal: No bilateral pedal edema noted Skin: Normal Psychiatry: Normal mood     Data Reviewed: I have personally reviewed following labs and imaging studies  CBC: Recent Labs  Lab 08/30/21 0201 09/02/21 0212 09/05/21 0926  WBC 4.2 4.6 4.6  NEUTROABS 2.6 3.0 3.1  HGB 9.1* 9.3* 9.6*  HCT 29.4* 29.8* 31.3*  MCV 93.0 92.0 92.6  PLT 183 240 762   Basic  Metabolic Panel: Recent Labs  Lab 08/30/21 0201 08/31/21 0431 09/02/21 0212 09/03/21 0357 09/04/21 0339 09/05/21 0926  NA 136  --  137 137 138 138  K 4.0  --  3.9 3.7 3.6 3.9  CL 94*  --  93* 92* 94* 97*  CO2 36*  --  34* 37* 38* 36*  GLUCOSE 82  --  79 78 77 81  BUN 12  --  _0 CREATININE 0.86 0.82 0.86 1.03 1.00 0.87  CALCIUM 8.8*  --  9.4 9.0 9.0 8.7*  MG 1.9  --   --   --   --   --   PHOS 3.6  --   --   --   --   --    GFR: Estimated Creatinine Clearance: 64.4 mL/min (by C-G formula based on SCr of 0.87 mg/dL). Liver Function Tests: Recent Labs  Lab 08/30/21 0201  AST 21  ALT 17  ALKPHOS 39  BILITOT 0.4  PROT 5.4*  ALBUMIN 2.9*   No results for input(s): LIPASE, AMYLASE in the last 168 hours. No results for input(s): AMMONIA in the last 168 hours.  Coagulation Profile: No results for input(s): INR, PROTIME in the last 168 hours. Cardiac Enzymes: No results for input(s): CKTOTAL, CKMB, CKMBINDEX, TROPONINI in the last 168 hours. BNP (last 3 results) No results for input(s): PROBNP in the last 8760 hours. HbA1C: No results for input(s): HGBA1C in the last 72 hours.  CBG: Recent Labs  Lab 09/02/21 0752 09/02/21 1103 09/03/21 0147 09/03/21 1123 09/04/21 0819  GLUCAP 79 122* 145* 77 88   Lipid Profile: No results for input(s): CHOL, HDL, LDLCALC, TRIG, CHOLHDL, LDLDIRECT in the last 72 hours. Thyroid Function Tests: No results for input(s): TSH, T4TOTAL, FREET4, T3FREE, THYROIDAB in the last 72 hours. Anemia Panel: No results for input(s): VITAMINB12, FOLATE, FERRITIN, TIBC, IRON, RETICCTPCT in the last 72 hours.  Sepsis Labs: No results for input(s): PROCALCITON, LATICACIDVEN in the last 168 hours.  No results found for this or any previous visit (from the past 240 hour(s)).  RN Pressure Injury Documentation:     Estimated body mass index is 18 kg/m as calculated from the following:   Height as of this encounter: _0  (1.753 m).   Weight  as of this encounter: 55.3 kg.  Malnutrition Type:    Malnutrition Characteristics:   Nutrition Interventions:     Radiology Studies: CT ABDOMEN PELVIS W CONTRAST  Result Date: 09/04/2021 CLINICAL DATA:  Acute abdominal pain. History of lung cancer in 2005 with left lung removal. EXAM: CT ABDOMEN AND PELVIS WITH CONTRAST TECHNIQUE: Multidetector CT imaging of the abdomen and pelvis was performed using the standard protocol following bolus administration of intravenous contrast. CONTRAST:  185m OMNIPAQUE IOHEXOL 300 MG/ML  SOLN COMPARISON:  Ultrasound abdomen 08/27/2021. CT chest 08/19/2021. FINDINGS: Lower chest: There are pleural calcifications in the left hemithorax. There is fluid throughout the visualized lower left hemithorax. Fluid size and shape appear similar to the prior examination, but there is new air within the fluid. The visualized right lung bases clear. Hepatobiliary: There is an 11 mm cyst in the right lobe of the liver similar to prior ultrasound. The liver, gallbladder and bile ducts are otherwise within normal limits. Pancreas: Unremarkable. No pancreatic ductal dilatation or surrounding inflammatory changes. Spleen: Normal in size without focal abnormality. Adrenals/Urinary Tract: The bilateral adrenal glands are within normal limits. There are cortical hypodensities in both kidneys which are too small to characterize. There is no hydronephrosis or perinephric fluid. The bladder is within normal limits. Stomach/Bowel: Stomach is within normal limits. Appendix is not visualized. No evidence of bowel wall thickening, distention, or inflammatory changes. Vascular/Lymphatic: Aortic atherosclerosis. No enlarged abdominal or pelvic lymph nodes. Reproductive: Prostate gland is enlarged. Other: No abdominal wall hernia or abnormality. No abdominopelvic ascites. Musculoskeletal: No acute or significant osseous findings. IMPRESSION: 1. No acute localizing process in the abdomen or pelvis. 2.  Fluid-filled left hemithorax is again noted. There is some new air within the fluid which may be related to new infection or may be due to recent instrumentation. Please correlate clinically. 3. Prostatomegaly. Electronically Signed   By: ARonney AstersM.D.   On: 09/04/2021 22:46    Scheduled Meds:  aspirin  81 mg Oral Daily   carvedilol  3.125 mg Oral BID WC   enoxaparin (LOVENOX) injection  40 mg Subcutaneous Q24H   furosemide  40 mg Oral Daily   influenza vaccine adjuvanted  0.5 mL Intramuscular Tomorrow-1000   lisinopril  5 mg Oral Daily   mouth rinse  15 mL Mouth Rinse BID   pantoprazole  40 mg Oral  Daily   polyethylene glycol  17 g Oral Daily   senna-docusate  1 tablet Oral BID   simvastatin  20 mg Oral QHS   sodium chloride flush  3 mL Intravenous Q12H   umeclidinium bromide  1 puff Inhalation Daily   Continuous Infusions:  sodium chloride      LOS: 16 days   Alma Friendly, MD [facility] PAGER is on AMION  If 7PM-7AM, please contact night-coverage [email or web address]

## 2021-09-05 NOTE — Progress Notes (Signed)
Occupational Therapy Treatment Patient Details Name: [name] [name] MRN: [id number] DOB: Feb 21, 1951 Today's Date: 09/05/2021   History of present illness 70 yo male presents to [name] 9/1 with SOB, LE edema, bloating. CT chest shows L large pleural effusion, s/p thoracentesis x2 on 9/2 and 9/6, first yielding 900 cc and second yileding 500 cc. Required transfer to ICU for shock, now resolved. Current work up for acute on chronic hypercapnic respiratory failure in setting of emphysema and history of SCC, pulmonary hypertension/R HF. s/p RHC 9/7. 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   OT comments  Pt with progress towards goals this session. Focused session on challenging activity tolerance, balance, and pursed-lip breathing during ADLs. Pt performing functional mobility to sink for grooming with supervision. Pt reporting minimal SOB during grooming on room air. Continuing to recommend home with no OT follow-up, updated to intermittent supervision due to safety. Will continue to follow in the acute setting.    SpO2 93% sitting EOB on room air with consistent pleth line. Pt with inconsistent pleth line while grooming at sink on room air with SpO2 fluctuating from 86-92. Pt O2 returned to sitting EOB reporting no SOB and and declining need for O2. SpO2 98 with consistent pleth line upon departure from pts room.     Recommendations for follow up therapy are one component of a multi-disciplinary discharge planning process, led by the attending physician.  Recommendations may be updated based on patient status, additional functional criteria and insurance authorization.    Follow Up Recommendations  No OT follow up; Supervision-intermittent.   Equipment Recommendations  None recommended by OT    Recommendations for Other Services      Precautions / Restrictions Precautions Precautions:  Fall Precaution Comments: monitor O2 Restrictions Weight Bearing Restrictions: No       Mobility Bed Mobility Overal bed mobility: Needs Assistance Bed Mobility: Supine to Sit     Supine to sit: Supervision;HOB elevated     General bed mobility comments: Pt required supervision for safety and line management    Transfers Overall transfer level: Independent Equipment used: None Transfers: Sit to/from Stand Sit to Stand: Supervision         General transfer comment: No LOB, supervision for safety and line management.    Balance Overall balance assessment: No apparent balance deficits (not formally assessed) Sitting-balance support: Feet supported Sitting balance-[name] Scale: Good     Standing balance support: No upper extremity supported Standing balance-[name] Scale: Good Standing balance comment: Pt with good static standing and no LOBs during grooming at sink.                           ADL either performed or assessed with clinical judgement   ADL Overall ADL's : Needs assistance/impaired     Grooming: Wash/dry face;Supervision/safety;Standing Grooming Details (indicate cue type and reason): Pt required supervision for safety, line managemnet, and monitoring O2 while washing face at sink.             Lower Body Dressing: Supervision/safety Lower Body Dressing Details (indicate cue type and reason): pt donned socks sitting EOB with supervision for safety. Toilet Transfer: Supervision/safety;Ambulation Toilet Transfer Details (indicate cue type and reason): Simulated EOB, pt required supervision for sit to stand safety and line management, pt slightly impulsive and required min verbal cues to wait for therapist to manage lines and check SpO2  before standing.         Functional mobility during ADLs: Supervision/safety General ADL Comments: Pt with decreased activity tolerance and SOB standing at sink to wash face.     Vision   Vision  Assessment?: No apparent visual deficits   Perception     Praxis      Cognition Arousal/Alertness: Awake/alert Behavior During Therapy: WFL for tasks assessed/performed Overall Cognitive Status: Within Functional Limits for tasks assessed                                 General Comments: Pt reports he was feeling tired, but cooperative and willing to participate        Exercises     Shoulder Instructions       General Comments SpO2 93% sitting EOB on room air with consistent pleth line. Pt with inconsistent pleth line while grooming at sink on room air with SpO2 fluctuating from 86-92. Pt O2 returned to sitting EOB reporting no SOB and and declining need for O2. SpO2 98 with consistent pleth line upon departure from pts room.    Pertinent Vitals/ Pain       Pain Assessment: 0-10 Faces Pain Scale: No hurt  Home Living                                          Prior Functioning/Environment              Frequency  Min 2X/week        Progress Toward Goals  OT Goals(current goals can now be found in the care plan section)  Progress towards OT goals: Progressing toward goals  Acute Rehab OT Goals Patient Stated Goal: to go home and return to baseline OT Goal Formulation: With patient Time For Goal Achievement: 09/11/21 Potential to Achieve Goals: Good ADL Goals Pt Will Perform Grooming: with modified independence Pt Will Perform Lower Body Dressing: with modified independence Pt Will Transfer to Toilet: Independently;regular height toilet Pt Will Perform Toileting - Clothing Manipulation and hygiene: with modified independence Pt Will Perform Tub/Shower Transfer: with supervision  Plan Discharge plan remains appropriate    Co-evaluation                 AM-PAC OT "6 Clicks" Daily Activity     Outcome Measure   Help from another Mcdanel eating meals?: None Help from another [place] taking care of personal grooming?: A  Little Help from another [place] toileting, which includes using toliet, bedpan, or urinal?: A Little Help from another Pund bathing (including washing, rinsing, drying)?: A Little Help from another Desir to put on and taking off regular upper body clothing?: None Help from another [place] to put on and taking off regular lower body clothing?: A Little 6 Click Score: 20    End of Session Equipment Utilized During Treatment: Oxygen  OT Visit Diagnosis: Muscle weakness (generalized) (M62.81);Pain   Activity Tolerance Patient tolerated treatment well   Patient Left in bed;with call bell/phone within reach   Nurse Communication Mobility status;Other (comment) (SpO2 readings and bed alarm off due to pt wanting to sit upright EOB.)        Time: 0177-9390 OT Time Calculation (min): 15 min  Charges: OT General Charges $OT Visit: 1 Visit OT Treatments $Self Care/Home Management : 8-22 mins  Jackquline Denmark,  OTS Acute Rehab Office: [phone]   Eunice Oldaker 09/05/2021, 10:52 AM

## 2021-09-05 NOTE — Progress Notes (Addendum)
Patient complaining of a sharp pain, right chest, radiating into axilla area and inferior, then radiates around to scapular area and up to shoulder and neck. States pain is 10/10 and then eases off down to no pain. Pain consistent upper right abdomen just below the lateral right chest and does not change. Notifying provider. No pain medication ordered. Tylenol relieves rihgt shoulder pain. Will give simethicone and tylenol now.  1355 Sharp pain remains in the right lateral abdomen and chest. Pain resolved in the shoulder, scapula and under right axilla.

## 2021-09-05 NOTE — Progress Notes (Signed)
PT Cancellation Note  Patient Details Name: [name] [name] MRN: [id number] DOB: 28-Oct-1951   Cancelled Treatment:    Reason Eval/Treat Not Completed: Pain limiting ability to participate; attempted several times to see pt.  Initially bathing then getting ready to eat, finally reported abdominal pain affecting his breathing and unable to walk this pm.  Will attempt another day.   Reginia Naas 09/05/2021, 4:26 PM Magda Kiel, PT Acute Rehabilitation Services Pager:[phone] [phone] 09/05/2021

## 2021-09-05 NOTE — Telephone Encounter (Signed)
I called daughter. We discussed outpatient plans for [facility] and pulmonary rehab. Remains inpatient until NIV is available. We have previously discussed transplant but he would not be considered as a candidate until he can be discharged and we can successfully complete pulm rehab first. We spent time discussing his overall prognosis with his multiple co-morbidities including chronic hypercarbic respiratory failure related to s/p pneumonectomy and right heart failure/PH. I explained that he will likely need oxygen and NIV for the remainder of his life but we can work to optimize his quality of life and respiratory status. She expressed understanding and appreciation for this phone call.

## 2021-09-06 ENCOUNTER — Inpatient Hospital Stay (HOSPITAL_COMMUNITY): Payer: Medicare Other

## 2021-09-06 NOTE — Progress Notes (Signed)
Physical Therapy Treatment Patient Details Name: [name] [name] MRN: [id number] DOB: 1951-01-08 Today's Date: 09/06/2021   History of Present Illness 70 yo male presents to [facility] 9/1 with SOB, LE edema, bloating. CT chest shows L large pleural effusion, s/p thoracentesis x2 on 9/2 and 9/6, first yielding 900 cc and second yileding 500 cc. Required transfer to ICU for shock, now resolved. Current work up for acute on chronic hypercapnic respiratory failure in setting of emphysema and history of SCC, pulmonary hypertension/R HF. s/p RHC 9/7. 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    PT Comments    Pt glad PT present prior to him eating his lunch as he does not like to exercise on a full stomach. Focus of session education about and practice with purse lip breathing to recover SaO2 drop with exercise. Pt continues to move well, independent with transfers and supervision for ambulation without AD. Pt very appreciative of information. D/c plans remain appropriate. PT will continue to follow acutely.    Recommendations for follow up therapy are one component of a multi-disciplinary discharge planning process, led by the attending physician.  Recommendations may be updated based on patient status, additional functional criteria and insurance authorization.  Follow Up Recommendations  No PT follow up;Supervision for mobility/OOB;Other (comment) (consider pulm rehab)     Equipment Recommendations  None recommended by PT       Precautions / Restrictions Precautions Precautions: Fall Precaution Comments: monitor O2 Restrictions Weight Bearing Restrictions: No     Mobility  Bed Mobility               General bed mobility comments: Pt sitting in recliner upon arrival.    Transfers Overall transfer level: Independent Equipment used: None Transfers: Sit to/from Stand Sit to  Stand: Independent         General transfer comment: Independent with transfer to stand from recliner, no LOB.  Ambulation/Gait Ambulation/Gait assistance: Supervision Gait Distance (Feet): 450 Feet Assistive device: None Gait Pattern/deviations: Step-through pattern;Narrow base of support Gait velocity: controlled, WFL Gait velocity interpretation: >2.62 ft/sec, indicative of community ambulatory General Gait Details: overall strong steady gait, cues for purse lip breathing, with ambulation continuing into standing rest break          Balance Overall balance assessment: No apparent balance deficits (not formally assessed) Sitting-balance support: Feet supported Sitting balance-[name] Scale: Good     Standing balance support: No upper extremity supported Standing balance-[name] Scale: Good                              Cognition Arousal/Alertness: Awake/alert Behavior During Therapy: WFL for tasks assessed/performed Overall Cognitive Status: Within Functional Limits for tasks assessed                                 General Comments: pleasant and cooperative, very receptive to education about purse lip breathing      Exercises  Purse lip breathing     General Comments General comments (skin integrity, edema, etc.): Pt on 2L O2 via [place] SaO2 100%O2, removed and pt able to maintain 100%O2 on RA, able to ambulate on RA with SaO2 after 200 feet ambulation pt took standing rest break and SaO2 dropped preciptously to 75%O2, instructed pt in purse lip breathing did not show signs of  recovery after 1 min so added 2L O2 via [place], pt able to recover ~ 26mn, ambulated back to room on 2L O2 via , SaO2 in high 90%s, however with standing rest break dropped to 83%O2, pt able to recover with purse lip breathing >1 min. At end of session pt 100%on 2L, supplemental O2 removed and pt able to maintain SaO2 on RA in high 90s provided extensive education on purse lip  breathing and CO2/O2 relationship with COPD. Pt appreciative of information      Pertinent Vitals/Pain Pain Assessment: No/denies pain     PT Goals (current goals can now be found in the care plan section) Acute Rehab PT Goals Patient Stated Goal: to go home and return to baseline PT Goal Formulation: With patient Time For Goal Achievement: 09/10/21 Potential to Achieve Goals: Good Progress towards PT goals: Progressing toward goals    Frequency    Min 3X/week      PT Plan Current plan remains appropriate       AM-PAC PT "6 Clicks" Mobility   Outcome Measure  Help needed turning from your back to your side while in a flat bed without using bedrails?: None Help needed moving from lying on your back to sitting on the side of a flat bed without using bedrails?: None Help needed moving to and from a bed to a chair (including a wheelchair)?: None Help needed standing up from a chair using your arms (e.g., wheelchair or bedside chair)?: None Help needed to walk in hospital room?: A Little Help needed climbing 3-5 steps with a railing? : A Little 6 Click Score: 22    End of Session Equipment Utilized During Treatment: Oxygen Activity Tolerance: Patient tolerated treatment well Patient left: with call bell/phone within reach (standing near recliner) Nurse Communication: Mobility status;Other (comment) (O2 requirments) PT Visit Diagnosis: Other abnormalities of gait and mobility (R26.89);Muscle weakness (generalized) (M62.81)     Time: 11504-1364PT Time Calculation (min) (ACUTE ONLY): 32 min  Charges:  $Therapeutic Exercise: 23-37 mins                     Geovonni Meyerhoff B. VMigdalia DkPT, DPT Acute Rehabilitation Services Pager [phone] [phone]   [place]9/20/2022, 2:48 PM

## 2021-09-06 NOTE — Plan of Care (Signed)
  Problem: Education: Goal: Knowledge of General Education information will improve Description: Including pain rating scale, medication(s)/side effects and non-pharmacologic comfort measures Outcome: Progressing   Problem: Clinical Measurements: Goal: Ability to maintain clinical measurements within normal limits will improve Outcome: Progressing   Problem: Clinical Measurements: Goal: Diagnostic test results will improve Outcome: Progressing   Problem: Clinical Measurements: Goal: Respiratory complications will improve Outcome: Progressing   Problem: Clinical Measurements: Goal: Cardiovascular complication will be avoided Outcome: Progressing   Problem: Elimination: Goal: Will not experience complications related to bowel motility Outcome: Progressing   Problem: Pain Managment: Goal: General experience of comfort will improve Outcome: Progressing   Problem: Safety: Goal: Ability to remain free from injury will improve Outcome: Progressing   Problem: Cardiac: Goal: Will achieve and/or maintain hemodynamic stability Outcome: Progressing   Problem: Education: Goal: Understanding of CV disease, CV risk reduction, and recovery process will improve Outcome: Progressing

## 2021-09-06 NOTE — Progress Notes (Signed)
PROGRESS NOTE    [name] [name]  EAV:[id number] DOB: 09/24/1951 DOA: 08/20/2021 PCP: Patient, No Pcp Per (Inactive)   Brief Narrative:  70 year old male who was admitted by the hospitalist service and subsequently transferred to the critical care service for evaluation management of left-sided pleural effusion and empyema.  He initially presented to [facility] ED on 08/18/2021 with complaints of shortness of breath, lower extremity edema and abdominal bloating.  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 CT of the chest demonstrated a large left-sided pleural effusion. IR completed a thoracentesis with 900 mL of thick consistency pleural fluid removed with a thick calcified rind encasing fluid.  He was transferred to the ICU at [facility] given his shock and also for evaluation by cardiothoracic surgery. Pt transferred back to the [facility] service on 08/25/2021. Noted to have dysphagia, GI was consulted and he underwent an EGD. On ambulation patient desaturated to 81% still require supplemental oxygen at home.  Pulmonary recommended trilogy vent at home, still awaiting device.   Assessment & Plan:   Active Problems:   Pleural effusion   Shock ([place])   Acute respiratory failure with hypoxia and hypercapnia (HCC)   Hyponatremia   Acute on chronic respiratory failure with hypoxia and hypercapnia in the setting of pleural effusion and COPD Currently requiring about 3L of O2, bipap nightly and prn Patient has severe emphysema and has a history of squamous cell carcinoma status post left pneumonectomy in 2005 Pulmonary has been consulted recommending continue BiPAP as needed and nightly Status Post thoracentesis x2 Repeat CXR done on 08/31/21 unchanged from previous as pt continues to c/o subjective SOB (discussed with Dr Tamala Julian  PCCM on 09/01/21 about latest CXR findings, no further recommendation except for trilogy Repeat CXR pending as pt c/o L sided abdominal pain radiating to his back along his pneumonectomy scar (Trop X 2 neg, EKG with NSR) Awaiting Trilogy insurance authorization and delivery of equipment  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  In the setting of COPD Echocardiogram was consistent with moderate pulmonary hypertension with very mild right ventricular strain Underwent a right heart cath to quantify pulm hypertension and evaluate his volume status Cardiology recommending appropriate oxygenation optimizing volume status Restart lasix  Hypotension in the setting of shock Resolved Initially required Levophed but now resolved D/c midodrine as BP now elevated  Hyperlipidemia Continue Statin  Hypertension Stable Restart coreg, lisinopril (at a lower dose and titrate to home dose) Hold HCTZ- may d/c upon discharge  Normocytic Anemia  Anemia Panel showed an iron level of 94, U IBC 389, TIBC 43, saturation ratios of 19%, ferritin of 72, folate of 15.4, vitamin B12 482 Continue to Monitor for S/Sx of Bleeding; No overt Bleeding noted  Generalized abdominal pain Constipation Non-specific Abd xray with mod amount of stool with gas CT abdomen/pelvis with no acute intra-abdominal pathology Bowel regimen  Unintentional weight loss and dysphagia Esophageal stricture Had a prior work-up back in 2022 with a CT abdomen pelvis was negative for structural issues Had SLP on 9/4 and an esophagram on 9/8 which showed no specific stricture but barium pill did not pass of the stomach raising question of GE junction narrowing Patient underwent EGD with dilatation of the esophagus, GI recommending PPI and US abdomen which was negative for cholelithiasis but did show a small liver simple  cyst L is 1 x 1 x 1.2 cm Plan for Colonoscopy in the outpatient setting    DVT prophylaxis: Enoxaparin  40 mg subcu q. 24 Code Status: FULL CODE  Family Communication: Discussed with daughter on 09/02/21 Disposition Plan: patient will need oxygen for discharge and Trilogy Vent for D/C and currently trilogy vent is still pending to be delivered  Status is: Inpatient  Remains inpatient appropriate because:Unsafe d/c plan, IV treatments appropriate due to intensity of illness or inability to take PO, and Inpatient level of care appropriate due to severity of illness  Dispo: The patient is from: Home              Anticipated d/c is to: Home              Patient currently is not medically stable to d/c.   Difficult to place patient No  Consultants:  Cardiovascular surgery PCCM Cardiology  Procedures:  9/2 Thora with 900cc removed, new levophed requirement, transfer to [facility] 9/6 Thora with 500cc removed  RHC 9/7 with mild to mod PAH, normal left sided pressures.   EGD    Antimicrobials:  None    Subjective: c/o L sided abdominal pain radiating to his back along his pneumonectomy scar. CXR pending, otherwise no c/o   Objective: Vitals:   09/06/21 0327 09/06/21 0528 09/06/21 0906 09/06/21 1351  BP:  (!) 102/48 132/76 127/80  Pulse: 75 89 89 90  Resp: (!) _0 Temp:   98 F (36.7 C) 98 F (36.7 C)  TempSrc:  Axillary Oral Oral  SpO2: 100% 100% 99% 99%  Weight:  54.9 kg    Height:        Intake/Output Summary (Last 24 hours) at 09/06/2021 1918 Last data filed at 09/06/2021 0902 Gross per 24 hour  Intake 240 ml  Output 1650 ml  Net -1410 ml   Filed Weights   09/04/21 0508 09/05/21 0542 09/06/21 0528  Weight: 54 kg 55.3 kg 54.9 kg   Examination:  Physical Exam: General: NAD, chronically ill appearing  Cardiovascular: S1, S2 present Respiratory: Diminished BS b/l Abdomen: Soft, nontender, nondistended, bowel sounds present Musculoskeletal: No bilateral pedal edema noted Skin: Normal Psychiatry: Normal mood     Data Reviewed: I have personally reviewed  following labs and imaging studies  CBC: Recent Labs  Lab 09/02/21 0212 09/05/21 0926  WBC 4.6 4.6  NEUTROABS 3.0 3.1  HGB 9.3* 9.6*  HCT 29.8* 31.3*  MCV 92.0 92.6  PLT 240 370   Basic Metabolic Panel: Recent Labs  Lab 08/31/21 0431 09/02/21 0212 09/03/21 0357 09/04/21 0339 09/05/21 0926  NA  --  137 137 138 138  K  --  3.9 3.7 3.6 3.9  CL  --  93* 92* 94* 97*  CO2  --  34* 37* 38* 36*  GLUCOSE  --  79 78 77 81  BUN  --  _1 CREATININE 0.82 0.86 1.03 1.00 0.87  CALCIUM  --  9.4 9.0 9.0 8.7*   GFR: Estimated Creatinine Clearance: 64 mL/min (by C-G formula based on SCr of 0.87 mg/dL). Liver Function Tests: No results for input(s): AST, ALT, ALKPHOS, BILITOT, PROT, ALBUMIN in the last 168 hours.  No results for input(s): LIPASE, AMYLASE in the last 168 hours. No results for input(s): AMMONIA in the last 168 hours. Coagulation Profile: No results for input(s): INR, PROTIME in the last 168 hours. Cardiac Enzymes: No results for input(s): CKTOTAL, CKMB, CKMBINDEX,  TROPONINI in the last 168 hours. BNP (last 3 results) No results for input(s): PROBNP in the last 8760 hours. HbA1C: No results for input(s): HGBA1C in the last 72 hours.  CBG: Recent Labs  Lab 09/02/21 0752 09/02/21 1103 09/03/21 0147 09/03/21 1123 09/04/21 0819  GLUCAP 79 122* 145* 77 88   Lipid Profile: No results for input(s): CHOL, HDL, LDLCALC, TRIG, CHOLHDL, LDLDIRECT in the last 72 hours. Thyroid Function Tests: No results for input(s): TSH, T4TOTAL, FREET4, T3FREE, THYROIDAB in the last 72 hours. Anemia Panel: No results for input(s): VITAMINB12, FOLATE, FERRITIN, TIBC, IRON, RETICCTPCT in the last 72 hours.  Sepsis Labs: No results for input(s): PROCALCITON, LATICACIDVEN in the last 168 hours.  No results found for this or any previous visit (from the past 240 hour(s)).  RN Pressure Injury Documentation:     Estimated body mass index is 17.87 kg/m as calculated from the  following:   Height as of this encounter: _0  (1.753 m).   Weight as of this encounter: 54.9 kg.  Malnutrition Type:    Malnutrition Characteristics:   Nutrition Interventions:     Radiology Studies: CT ABDOMEN PELVIS W CONTRAST  Result Date: 09/04/2021 CLINICAL DATA:  Acute abdominal pain. History of lung cancer in 2005 with left lung removal. EXAM: CT ABDOMEN AND PELVIS WITH CONTRAST TECHNIQUE: Multidetector CT imaging of the abdomen and pelvis was performed using the standard protocol following bolus administration of intravenous contrast. CONTRAST:  17m OMNIPAQUE IOHEXOL 300 MG/ML  SOLN COMPARISON:  Ultrasound abdomen 08/27/2021. CT chest 08/19/2021. FINDINGS: Lower chest: There are pleural calcifications in the left hemithorax. There is fluid throughout the visualized lower left hemithorax. Fluid size and shape appear similar to the prior examination, but there is new air within the fluid. The visualized right lung bases clear. Hepatobiliary: There is an 11 mm cyst in the right lobe of the liver similar to prior ultrasound. The liver, gallbladder and bile ducts are otherwise within normal limits. Pancreas: Unremarkable. No pancreatic ductal dilatation or surrounding inflammatory changes. Spleen: Normal in size without focal abnormality. Adrenals/Urinary Tract: The bilateral adrenal glands are within normal limits. There are cortical hypodensities in both kidneys which are too small to characterize. There is no hydronephrosis or perinephric fluid. The bladder is within normal limits. Stomach/Bowel: Stomach is within normal limits. Appendix is not visualized. No evidence of bowel wall thickening, distention, or inflammatory changes. Vascular/Lymphatic: Aortic atherosclerosis. No enlarged abdominal or pelvic lymph nodes. Reproductive: Prostate gland is enlarged. Other: No abdominal wall hernia or abnormality. No abdominopelvic ascites. Musculoskeletal: No acute or significant osseous findings.  IMPRESSION: 1. No acute localizing process in the abdomen or pelvis. 2. Fluid-filled left hemithorax is again noted. There is some new air within the fluid which may be related to new infection or may be due to recent instrumentation. Please correlate clinically. 3. Prostatomegaly. Electronically Signed   By: ARonney AstersM.D.   On: 09/04/2021 22:46    Scheduled Meds:  aspirin  81 mg Oral Daily   carvedilol  3.125 mg Oral BID WC   enoxaparin (LOVENOX) injection  40 mg Subcutaneous Q24H   furosemide  40 mg Oral Daily   influenza vaccine adjuvanted  0.5 mL Intramuscular Tomorrow-1000   lisinopril  5 mg Oral Daily   mouth rinse  15 mL Mouth Rinse BID   pantoprazole  40 mg Oral Daily   polyethylene glycol  17 g Oral Daily   senna-docusate  1 tablet Oral BID   simvastatin  20 mg Oral QHS   sodium chloride flush  3 mL Intravenous Q12H   umeclidinium bromide  1 puff Inhalation Daily   Continuous Infusions:  sodium chloride      LOS: 17 days   Alma Friendly, MD [facility] PAGER is on AMION  If 7PM-7AM, please contact night-coverage [email or web address]

## 2021-09-07 DIAGNOSIS — R1013 Epigastric pain: Secondary | ICD-10-CM

## 2021-09-07 NOTE — Progress Notes (Signed)
PT Cancellation Note  Patient Details Name: [name] [name] MRN: [id number] DOB: 1951-03-12   Cancelled Treatment:    Reason Eval/Treat Not Completed: Pain limiting ability to participate.  Made two attempts to see pt, first in which he was eating a meal, and then was too tight and painful in abdomen to walk.  Follow along with him to see as time and pt allow.   Ramond Dial 09/07/2021, 3:14 PM  Mee Hives, PT MS Acute Rehab Dept. Number: [place] and [place]

## 2021-09-07 NOTE — Plan of Care (Signed)
  Problem: Health Behavior/Discharge Planning: Goal: Ability to manage health-related needs will improve Outcome: Progressing   Problem: Clinical Measurements: Goal: Ability to maintain clinical measurements within normal limits will improve Outcome: Progressing   Problem: Clinical Measurements: Goal: Respiratory complications will improve Outcome: Progressing   Problem: Clinical Measurements: Goal: Cardiovascular complication will be avoided Outcome: Progressing   Problem: Elimination: Goal: Will not experience complications related to bowel motility Outcome: Progressing   Problem: Cardiac: Goal: Will achieve and/or maintain hemodynamic stability Outcome: Progressing   Problem: Cardiovascular: Goal: Ability to achieve and maintain adequate cardiovascular perfusion will improve Outcome: Progressing

## 2021-09-07 NOTE — Progress Notes (Signed)
Occupational Therapy Treatment Patient Details Name: [name] [name] MRN: [id number] DOB: 07/16/1951 Today's Date: 09/07/2021   History of present illness 70 yo male presents to [facility] 9/1 with SOB, LE edema, bloating. CT chest shows L large pleural effusion, s/p thoracentesis x2 on 9/2 and 9/6, first yielding 900 cc and second yileding 500 cc. Required transfer to ICU for shock, now resolved. Current work up for acute on chronic hypercapnic respiratory failure in setting of emphysema and history of SCC, pulmonary hypertension/R HF. s/p RHC 9/7. 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   OT comments  Pt. Was ed on energy conservation strategies. Pt. Was given handout and it was explained to pt. Pt. Was cooperative during session and asked questions appropriately. Acute OT to follow.    Recommendations for follow up therapy are one component of a multi-disciplinary discharge planning process, led by the attending physician.  Recommendations may be updated based on patient status, additional functional criteria and insurance authorization.    Follow Up Recommendations  No OT follow up    Equipment Recommendations  None recommended by OT    Recommendations for Other Services      Precautions / Restrictions Precautions Precautions: Fall Precaution Comments: monitor O2 Restrictions Weight Bearing Restrictions: No       Mobility Bed Mobility                    Transfers                      Balance     Sitting balance-[name] Scale: Good       Standing balance-[name] Scale: Good                             ADL either performed or assessed with clinical judgement   ADL Overall ADL's : Needs assistance/impaired Eating/Feeding: Independent;Sitting   Grooming: Wash/dry face;Supervision/safety;Standing   Upper Body Bathing: Supervision/  safety;Sitting   Lower Body Bathing: Sit to/from stand;Supervison/ safety   Upper Body Dressing : Supervision/safety;Sitting   Lower Body Dressing: Supervision/safety   Toilet Transfer: Supervision/safety;Ambulation   Toileting- Clothing Manipulation and Hygiene: Supervision/safety;Sitting/lateral lean       Functional mobility during ADLs: Supervision/safety General ADL Comments: Pt. ed on energy conservation and handout given.     Vision   Vision Assessment?: No apparent visual deficits   Perception     Praxis      Cognition Arousal/Alertness: Awake/alert Behavior During Therapy: WFL for tasks assessed/performed Overall Cognitive Status: Within Functional Limits for tasks assessed                                          Exercises     Shoulder Instructions       General Comments      Pertinent Vitals/ Pain       Pain Assessment: No/denies pain  Home Living                                          Prior Functioning/Environment              Frequency  Min 2X/week  Progress Toward Goals  OT Goals(current goals can now be found in the care plan section)  Progress towards OT goals: Progressing toward goals  Acute Rehab OT Goals Patient Stated Goal: go home OT Goal Formulation: With patient Time For Goal Achievement: 09/11/21 Potential to Achieve Goals: Good ADL Goals Pt Will Perform Grooming: with modified independence Pt Will Perform Lower Body Dressing: with modified independence Pt Will Transfer to Toilet: Independently;regular height toilet Pt Will Perform Toileting - Clothing Manipulation and hygiene: with modified independence Pt Will Perform Tub/Shower Transfer: with supervision  Plan Discharge plan remains appropriate    Co-evaluation                 AM-PAC OT "6 Clicks" Daily Activity     Outcome Measure   Help from another [place] eating meals?: None Help from another [place]  taking care of personal grooming?: A Little Help from another [place] toileting, which includes using toliet, bedpan, or urinal?: A Little Help from another Housey bathing (including washing, rinsing, drying)?: A Little Help from another Frommer to put on and taking off regular upper body clothing?: None Help from another [place] to put on and taking off regular lower body clothing?: A Little 6 Click Score: 20    End of Session Equipment Utilized During Treatment: Oxygen  OT Visit Diagnosis: Muscle weakness (generalized) (M62.81);Pain   Activity Tolerance Patient tolerated treatment well   Patient Left in chair;with call bell/phone within reach   Nurse Communication  (ok therapy)        Time: 1250-1315 OT Time Calculation (min): 25 min  Charges: OT General Charges $OT Visit: 1 Visit OT Treatments $Self Care/Home Management : 23-37 mins  Reece Packer OT/L   Tyresha Fede 09/07/2021, 1:34 PM

## 2021-09-07 NOTE — Progress Notes (Signed)
[facility] PROGRESS NOTE    Progress Note  Kamdyn Feria  PTW:[id number] DOB: 27-Aug-1951 DOA: 08/20/2021 PCP: Patient, No Pcp Per (Inactive)     Brief Narrative:   Lyell Clugston is an 70 y.o. 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  Transferred to [facility] on 08/25/2021, was noted to have dysphagia GI was consulted underwent EGD  Pulmonary recommended trilogy vent at home still awaiting device.   Assessment/Plan:   Acute on chronic respiratory failure with hypoxia and hypercarbia in the setting of pleural effusion and COPD: He is currently requiring 3 L oxygen and BiPAP at night as needed. Patient has severe emphysema with a history of squamous cell carcinoma. Pulmonary was consulted recommended BiPAP trilogy at night. Status post 2 thoracocentesis repeated chest x-ray done on 08/31/2021 was unchanged from previous, this was does not patient Complaining of shortness of breath.  It was discussed with PCCM they recommended no further recommendation except for trilogy. Awaiting trilogy insurance authorization and delivery of equipment.  Pulmonary hypertension group 3/right-sided heart failure: In the setting of severe COPD, 2D echo showed mild right ventricular strain underwent right heart cath, cardiology recommended oxygen and to restart his Lasix.  Hypotension in the setting of shock: Resolved initially required Levophed but now resolved. Midodrine  has been discontinued.  Hyperlipidemia: Excellent continue statins.  Essential hypertension: Restarted on Coreg and lisinopril low-dose. HCTZ discontinued.  Continue current dose of Lasix as an outpatient.  Normocytic anemia: No signs of overt bleeding follow-up with PCP as an outpatient.  Generalized abdominal pain/constipation: He was on a bowel regimen now resolved.  Unintentional weight loss and dysphagia: Patient had a barium swallow esophagus that showed no strictures but pills did not pass into the stomach. GI was consulted who found dilation of esophagus and they recommended a PPI. Abdominal ultrasound showed cholelithiasis but no acute findings. The plan to do a colonoscopy as an outpatient.      DVT prophylaxis: lovenox Family Communication:neon Status is: Inpatient  Remains inpatient appropriate because:Hemodynamically unstable  Dispo: The patient is from: Home              Anticipated d/c is to: Home              Patient currently is not medically stable to d/c.   Difficult to place patient No        Code Status:     Code Status Orders  (From admission, onward)           Start     Ordered   08/20/21 0126  Full code  Continuous        08/20/21 0128           Code Status History     Date Active Date Inactive Code Status Order ID Comments User Context   08/19/2021 0045 08/20/2021 0029 Full Code [id number]  Clance Boll, MD ED         IV Access:   Peripheral IV   Procedures and diagnostic studies:  DG Chest Port 1 View  Result Date: 09/06/2021 CLINICAL DATA:  Chest pain. EXAM: PORTABLE CHEST 1 VIEW COMPARISON:  Multiple previous imaging studies. FINDINGS: Stable opacification of the left hemithorax with rim-like calcifications. History of pneumonectomy. The right lung is unremarkable. No pulmonary infiltrates or effusion. Moderate emphysematous changes. IMPRESSION: 1. Stable opacification of the left hemithorax. 2. No acute  pulmonary findings. Electronically Signed   By: Marijo Sanes M.D.   On: 09/06/2021 19:57     Medical Consultants:   None.   Subjective:    Monterrius Fredericks tolerating his diet he relates his pain around the rib cage.  Objective:    Vitals:   09/07/21 0500 09/07/21 0827 09/07/21 0830 09/07/21 0837  BP:  128/81    Pulse:   78   Resp:      Temp:      TempSrc:      SpO2:    100%  Weight: 54.9 kg     Height:       SpO2: 100 % O2 Flow Rate (L/min): 1 L/min FiO2 (%): 24 %   Intake/Output Summary (Last 24 hours) at 09/07/2021 1032 Last data filed at 09/07/2021 0400 Gross per 24 hour  Intake 240 ml  Output 1100 ml  Net -860 ml   Filed Weights   09/05/21 0542 09/06/21 0528 09/07/21 0500  Weight: 55.3 kg 54.9 kg 54.9 kg    Exam: General exam: In no acute distress. Respiratory system: Good air movement and clear to auscultation.  Pain is reproducible by palpation Cardiovascular system: S1 & S2 heard, RRR. No JVD. Gastrointestinal system: Abdomen is nondistended, soft and nontender.  Extremities: No pedal edema. Skin: No rashes, lesions or ulcers Psychiatry: Judgement and insight appear normal. Mood & affect appropriate.    Data Reviewed:    Labs: Basic Metabolic Panel: Recent Labs  Lab 09/02/21 0212 09/03/21 0357 09/04/21 0339 09/05/21 0926  NA 137 137 138 138  K 3.9 3.7 3.6 3.9  CL 93* 92* 94* 97*  CO2 34* 37* 38* 36*  GLUCOSE 79 78 77 81  BUN _0 CREATININE 0.86 1.03 1.00 0.87  CALCIUM 9.4 9.0 9.0 8.7*   GFR Estimated Creatinine Clearance: 64 mL/min (by C-G formula based on SCr of 0.87 mg/dL). Liver Function Tests: No results for input(s): AST, ALT, ALKPHOS, BILITOT, PROT, ALBUMIN in the last 168 hours. No results for input(s): LIPASE, AMYLASE in the last 168 hours. No results for input(s): AMMONIA in the last 168 hours. Coagulation profile No results for input(s): INR, PROTIME in the last 168 hours. COVID-19 Labs  No results for input(s):  DDIMER, FERRITIN, LDH, CRP in the last 72 hours.  Lab Results  Component Value Date   SARSCOV2NAA NEGATIVE 08/18/2021   [place] NEGATIVE 12/30/2020    CBC: Recent Labs  Lab 09/02/21 0212 09/05/21 0926  WBC 4.6 4.6  NEUTROABS 3.0 3.1  HGB 9.3* 9.6*  HCT 29.8* 31.3*  MCV 92.0 92.6  PLT 240 263   Cardiac Enzymes: No results for input(s): CKTOTAL, CKMB, CKMBINDEX, TROPONINI in the last 168 hours. BNP (last 3 results) No results for input(s): PROBNP in the last 8760 hours. CBG: Recent Labs  Lab 09/02/21 0752 09/02/21 1103 09/03/21 0147 09/03/21 1123 09/04/21 0819  GLUCAP 79 122* 145* 77 88   D-Dimer: No results for input(s): DDIMER in the last 72 hours. Hgb A1c: No results for input(s): HGBA1C in the last 72 hours. Lipid Profile: No results for input(s): CHOL, HDL, LDLCALC,  TRIG, CHOLHDL, LDLDIRECT in the last 72 hours. Thyroid function studies: No results for input(s): TSH, T4TOTAL, T3FREE, THYROIDAB in the last 72 hours.  Invalid input(s): FREET3 Anemia work up: No results for input(s): VITAMINB12, FOLATE, FERRITIN, TIBC, IRON, RETICCTPCT in the last 72 hours. Sepsis Labs: Recent Labs  Lab 09/02/21 0212 09/05/21 0926  WBC 4.6 4.6   Microbiology No results found for this or any previous visit (from the past 240 hour(s)).   Medications:    aspirin  81 mg Oral Daily   carvedilol  3.125 mg Oral BID WC   enoxaparin (LOVENOX) injection  40 mg Subcutaneous Q24H   furosemide  40 mg Oral Daily   influenza vaccine adjuvanted  0.5 mL Intramuscular Tomorrow-1000   lisinopril  5 mg Oral Daily   mouth rinse  15 mL Mouth Rinse BID   pantoprazole  40 mg Oral Daily   polyethylene glycol  17 g Oral Daily   senna-docusate  1 tablet Oral BID   simvastatin  20 mg Oral QHS   sodium chloride flush  3 mL Intravenous Q12H   umeclidinium bromide  1 puff Inhalation Daily   Continuous Infusions:  sodium chloride        LOS: 18 days   Charlynne Cousins  [facility]  09/07/2021, 10:32 AM

## 2021-09-07 NOTE — Progress Notes (Signed)
Pt requesting to be taken off bipap at this time, pt placed back on 1 L [place].

## 2021-09-08 DIAGNOSIS — J432 Centrilobular emphysema: Secondary | ICD-10-CM

## 2021-09-08 NOTE — Progress Notes (Signed)
Physical Therapy Treatment Patient Details Name: [name] [name] MRN: [id number] DOB: 12/13/1951 Today's Date: 09/08/2021   History of Present Illness 70 yo male presents [facility] 9[facility]1 with SOB, LE edema, bloating. CT chest shows L large pleural effusion, s/p thoracentesis x2 on 9/2 and 9/6, first yielding 900 cc and second yileding 500 cc. Required transfer to ICU for shock, now resolved. Current work up for acute on chronic hypercapnic respiratory failure in setting of emphysema and history of SCC, pulmonary hypertension/R HF. s/p RHC 9/7. 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    PT Comments    Pt agreeable to ambulation in hallway, and is making good progress towards his goals. Pt is independent in mobility, however continues to require 2L O2 via [place] with ambulation to maintain SaO2 greater than 90%O2. With 900 ft of ambulation pt required 2x standing rest breaks with cues for pursed lip breathing when SaO2 dropped to mid 80%O2. D/c plans remain appropriate at this time. PT will continue to follow acutely.      Recommendations for follow up therapy are one component of a multi-disciplinary discharge planning process, led by the attending physician.  Recommendations may be updated based on patient status, additional functional criteria and insurance authorization.  Follow Up Recommendations  No PT follow up;Supervision for mobility/OOB;Other (comment) (consider pulm rehab)     Equipment Recommendations  None recommended by PT       Precautions / Restrictions Precautions Precautions: Fall Precaution Comments: monitor O2     Mobility  Bed Mobility               General bed mobility comments: Pt sitting in recliner upon arrival.    Transfers                 General transfer comment: standing at sink on entry  Ambulation/Gait Ambulation/Gait assistance:  Supervision Gait Distance (Feet): 900 Feet Assistive device: None Gait Pattern/deviations: Step-through pattern;Narrow base of support Gait velocity: controlled, WFL Gait velocity interpretation: >2.62 ft/sec, indicative of community ambulatory General Gait Details: overall strong steady gait, expericenced DoE related to aerobic exercise absent of drop in SaO2, discussed decreased endurance typical with hospital stay. With increased distance pt also has drop in SaO2 to mid 80%O2, educated on use of pursed lip breathing with any shortness of breath to improve oxygenation          Balance Overall balance assessment: No apparent balance deficits (not formally assessed) Sitting-balance support: Feet supported Sitting balance-[name] Scale: Good     Standing balance support: No upper extremity supported Standing balance-[name] Scale: Good                              Cognition Arousal/Alertness: Awake/alert Behavior During Therapy: WFL for tasks assessed/performed Overall Cognitive Status: Within Functional Limits for tasks assessed                                 General Comments: pleasant and cooperative, very receptive to education about purse lip breathing         General Comments General comments (skin integrity, edema, etc.): Pt ambulated on 2L O2 via [place] with SaO2 >90%O2 through most of ambulation 2x standing restbreaks required when SaO2 dropped to mid 80s.      Pertinent Vitals/Pain Pain Assessment:  No/denies pain     PT Goals (current goals can now be found in the care plan section) Acute Rehab PT Goals Patient Stated Goal: to go home and return to baseline PT Goal Formulation: With patient Time For Goal Achievement: 09/10/21 Potential to Achieve Goals: Good Progress towards PT goals: Progressing toward goals    Frequency    Min 3X/week      PT Plan Current plan remains appropriate       AM-PAC PT "6 Clicks" Mobility   Outcome  Measure  Help needed turning from your back to your side while in a flat bed without using bedrails?: None Help needed moving from lying on your back to sitting on the side of a flat bed without using bedrails?: None Help needed moving to and from a bed to a chair (including a wheelchair)?: None Help needed standing up from a chair using your arms (e.g., wheelchair or bedside chair)?: None Help needed to walk in hospital room?: A Little Help needed climbing 3-5 steps with a railing? : A Little 6 Click Score: 22    End of Session Equipment Utilized During Treatment: Oxygen Activity Tolerance: Patient tolerated treatment well Patient left: with call bell/phone within reach (standing near recliner) Nurse Communication: Mobility status;Other (comment) (O2 requirments) PT Visit Diagnosis: Other abnormalities of gait and mobility (R26.89);Muscle weakness (generalized) (M62.81)     Time: 0321-2248 PT Time Calculation (min) (ACUTE ONLY): 27 min  Charges:  $Therapeutic Exercise: 23-37 mins                     Gerasimos Plotts B. Migdalia Dk PT, DPT Acute Rehabilitation Services Pager [phone] Office [phone]    [place] 09/08/2021, 3:57 PM

## 2021-09-08 NOTE — Progress Notes (Signed)
PT Cancellation Note  Patient Details Name: Dajour Pierpoint MRN: [id number] DOB: [birth year]   Cancelled Treatment:    Reason Eval/Treat Not Completed: (P) Other (comment) Pt just finished in bathroom and washing up and would like time to recover. Requests PT come back later this afternoon. PT will return as able.  Eros Montour B. Migdalia Dk PT, DPT Acute Rehabilitation Services Pager [phone] Office [phone]    [place] 09/08/2021, 11:09 AM

## 2021-09-08 NOTE — Progress Notes (Signed)
[facility] PROGRESS NOTE    Progress Note  [name] [name]  PTW:[id number] DOB: 27-Aug-1951 DOA: 08/20/2021 PCP: Patient, No Pcp Per (Inactive)     Brief Narrative:   [name] [name] is an 71 y.o. 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  Transferred to [facility] services on 08/25/2021, was noted to have dysphagia GI was consulted underwent EGD  Pulmonary recommended trilogy vent at home still awaiting device.   Assessment/Plan:   Acute on chronic respiratory failure with hypoxia and hypercarbia in the setting of pleural effusion and COPD: He is currently requiring 3 L oxygen and BiPAP at night as needed. Patient has severe emphysema with a history of squamous cell carcinoma. Pulmonary was consulted recommended BiPAP trilogy at night. Status post 2 thoracocentesis repeated chest x-ray done on 08/31/2021 was unchanged from previous, this was does not patient Complaining of shortness of breath.  It was discussed with PCCM they recommended no further recommendation except for trilogy. Awaiting trilogy insurance authorization and delivery of equipment.  Pulmonary hypertension group 3/right-sided heart failure: In the setting of severe COPD, 2D echo showed mild right ventricular strain underwent right heart cath, cardiology recommended oxygen and to restart his Lasix.  Hypotension in the setting of shock: Resolved initially required Levophed but now resolved. Midodrine  has been discontinued.  Hyperlipidemia: Excellent continue statins.  Essential hypertension: Restarted on Coreg and lisinopril low-dose. HCTZ discontinued.  Continue current dose of Lasix as an outpatient.  Normocytic anemia: No signs of overt bleeding follow-up with PCP as an outpatient.  Generalized abdominal pain/constipation: He was on a bowel regimen now resolved.  Unintentional weight loss and dysphagia: Patient had a barium swallow esophagus that showed no strictures but pills did not pass into the stomach. GI was consulted who found dilation of esophagus and they recommended a PPI. Abdominal ultrasound showed cholelithiasis but no acute findings. The plan to do a colonoscopy as an outpatient.      DVT prophylaxis: lovenox Family Communication:neon Status is: Inpatient  Remains inpatient appropriate because:Hemodynamically unstable  Dispo: The patient is from: Home              Anticipated d/c is to: Home              Patient currently is not medically stable to d/c.   Difficult to place patient No        Code Status:     Code Status Orders  (From admission, onward)           Start     Ordered   08/20/21 0126  Full code  Continuous        08/20/21 0128           Code Status History     Date Active Date Inactive Code Status Order ID Comments User Context   08/19/2021 0045 08/20/2021 0029 Full Code [id number]  Clance Boll, MD ED         IV Access:   Peripheral IV   Procedures and diagnostic studies:  DG Chest Port 1 View  Result Date: 09/06/2021 CLINICAL DATA:  Chest pain. EXAM: PORTABLE CHEST 1 VIEW COMPARISON:  Multiple previous imaging studies. FINDINGS: Stable opacification of the left hemithorax with rim-like calcifications. History of pneumonectomy. The right lung is unremarkable. No pulmonary infiltrates or effusion. Moderate emphysematous changes. IMPRESSION: 1. Stable opacification of the left hemithorax. 2. No acute  pulmonary findings. Electronically Signed   By: Marijo Sanes M.D.   On: 09/06/2021 19:57     Medical Consultants:   None.   Subjective:    [name] [name] tolerating his diet he relates his pain around the rib cage.  Objective:    Vitals:   09/08/21 0453 09/08/21 0500 09/08/21 0809 09/08/21 0946  BP: 95/62   134/78  Pulse: 80   85  Resp: 17     Temp: (!) 97.2 F (36.2 C)     TempSrc: Oral     SpO2: 99%  100% 100%  Weight:  55.3 kg    Height:       SpO2: 100 % O2 Flow Rate (L/min): 1 L/min FiO2 (%): 24 %   Intake/Output Summary (Last 24 hours) at 09/08/2021 1043 Last data filed at 09/08/2021 0949 Gross per 24 hour  Intake 373 ml  Output 550 ml  Net -177 ml    Filed Weights   09/06/21 0528 09/07/21 0500 09/08/21 0500  Weight: 54.9 kg 54.9 kg 55.3 kg    Exam: General exam: In no acute distress. Respiratory system: Good air movement and clear to auscultation.  Pain is reproducible by palpation Cardiovascular system: S1 & S2 heard, RRR. No JVD. Gastrointestinal system: Abdomen is nondistended, soft and nontender.  Extremities: No pedal edema. Skin: No rashes, lesions or ulcers Psychiatry: Judgement and insight appear normal. Mood & affect appropriate.    Data Reviewed:    Labs: Basic Metabolic Panel: Recent Labs  Lab 09/02/21 0212 09/03/21 0357 09/04/21 0339 09/05/21 0926  NA 137 137 138 138  K 3.9 3.7 3.6 3.9  CL 93* 92* 94* 97*  CO2 34* 37* 38* 36*  GLUCOSE 79 78 77 81  BUN _0 CREATININE 0.86 1.03 1.00 0.87  CALCIUM 9.4 9.0 9.0 8.7*    GFR Estimated Creatinine Clearance: 64.4 mL/min (by C-G formula based on SCr of 0.87 mg/dL). Liver Function Tests: No results for input(s): AST, ALT, ALKPHOS, BILITOT, PROT, ALBUMIN in the last 168 hours. No results for input(s): LIPASE, AMYLASE in the last 168 hours. No results for input(s): AMMONIA in the last 168 hours. Coagulation profile No results for input(s): INR, PROTIME in the last 168  hours. COVID-19 Labs  No results for input(s): DDIMER, FERRITIN, LDH, CRP in the last 72 hours.  Lab Results  Component Value Date   SARSCOV2NAA NEGATIVE 08/18/2021   [place] NEGATIVE 12/30/2020    CBC: Recent Labs  Lab 09/02/21 0212 09/05/21 0926  WBC 4.6 4.6  NEUTROABS 3.0 3.1  HGB 9.3* 9.6*  HCT 29.8* 31.3*  MCV 92.0 92.6  PLT 240 263    Cardiac Enzymes: No results for input(s): CKTOTAL, CKMB, CKMBINDEX, TROPONINI in the last 168 hours. BNP (last 3 results) No results for input(s): PROBNP in the last 8760 hours. CBG: Recent Labs  Lab 09/02/21 0752 09/02/21 1103 09/03/21 0147 09/03/21 1123 09/04/21 0819  GLUCAP 79 122* 145* 77 88    D-Dimer: No results for input(s): DDIMER in the last 72 hours. Hgb A1c: No results for input(s): HGBA1C in the last 72 hours. Lipid  Profile: No results for input(s): CHOL, HDL, LDLCALC, TRIG, CHOLHDL, LDLDIRECT in the last 72 hours. Thyroid function studies: No results for input(s): TSH, T4TOTAL, T3FREE, THYROIDAB in the last 72 hours.  Invalid input(s): FREET3 Anemia work up: No results for input(s): VITAMINB12, FOLATE, FERRITIN, TIBC, IRON, RETICCTPCT in the last 72 hours. Sepsis Labs: Recent Labs  Lab 09/02/21 0212 09/05/21 0926  WBC 4.6 4.6    Microbiology No results found for this or any previous visit (from the past 240 hour(s)).   Medications:    aspirin  81 mg Oral Daily   carvedilol  3.125 mg Oral BID WC   enoxaparin (LOVENOX) injection  40 mg Subcutaneous Q24H   furosemide  40 mg Oral Daily   influenza vaccine adjuvanted  0.5 mL Intramuscular Tomorrow-1000   lisinopril  5 mg Oral Daily   mouth rinse  15 mL Mouth Rinse BID   pantoprazole  40 mg Oral Daily   polyethylene glycol  17 g Oral Daily   senna-docusate  1 tablet Oral BID   simvastatin  20 mg Oral QHS   sodium chloride flush  3 mL Intravenous Q12H   umeclidinium bromide  1 puff Inhalation Daily   Continuous Infusions:  sodium chloride         LOS: 19 days   Charlynne Cousins  [facility]  09/08/2021, 10:43 AM

## 2021-09-08 NOTE — Care Management (Addendum)
1133 09-08-21 Case Manager received notification that the attending will need to call [facility] for a P2P review by 3:00 pm for the Trilogy approval. Case Manager sent the message via secure chat. Case Manager will continue to follow for additional transition of care needs.   1457 09-08-21 Case Manager and MD were unable to get an answer from [phone] [phone] for the P2P. Case Manager called [facility] and received the P2P line for scheduling at [phone]. E-mail was sent from the representative to the physician advisor and registered nurse for this case and the representative asked if the provider could call ASAP. Information submitted for the MD to call the P2P by 3:00 pm. Case Manager will continue to follow.   [phone] 09-08-21 Physician was able to speak with physician advisor at [facility] was not approved according to physician advisor that the settings that were sent via order to them was for bilevel or can be used in bilevel. Case Manager contacted [employer] to make them aware and the order is clearly stated for Trilogy vent with the settings for Trilogy.

## 2021-09-09 NOTE — Progress Notes (Signed)
[facility] PROGRESS NOTE    Progress Note  Kamdyn Edmonds  PTW:[id number] DOB: 27-Aug-1951 DOA: 08/20/2021 PCP: Patient, No Pcp Per (Inactive)     Brief Narrative:   Lyell Clugston is an 70 y.o. 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  Transferred to triad services on 08/25/2021, was noted to have dysphagia GI was consulted underwent EGD  Pulmonary recommended trilogy vent at home still awaiting device.   Assessment/Plan:   Acute on chronic respiratory failure with hypoxia and hypercarbia in the setting of pleural effusion and COPD: He is currently requiring 3 L oxygen and BiPAP at night as needed. Patient has severe emphysema with a history of squamous cell carcinoma. Pulmonary was consulted recommended BiPAP trilogy at night. Status post 2 thoracocentesis repeated chest x-ray done on 08/31/2021 was unchanged from previous, this was does not patient Complaining of shortness of breath.  It was discussed with PCCM they recommended no further recommendation except for trilogy. Awaiting trilogy insurance authorization and delivery of equipment.  Pulmonary hypertension group 3/right-sided heart failure: In the setting of severe COPD, 2D echo showed mild right ventricular strain underwent right heart cath, cardiology recommended oxygen and to restart his Lasix.  Hypotension in the setting of shock: Resolved initially required Levophed but now resolved. Midodrine  has been discontinued.  Hyperlipidemia: Excellent continue statins.  Essential hypertension: Restarted on Coreg and lisinopril low-dose. HCTZ discontinued.  Continue current dose of Lasix as an outpatient.  Normocytic anemia: No signs of overt bleeding follow-up with PCP as an outpatient.  Generalized abdominal pain/constipation: He was on a bowel regimen now resolved.  Unintentional weight loss and dysphagia: Patient had a barium swallow esophagus that showed no strictures but pills did not pass into the stomach. GI was consulted who found dilation of esophagus and they recommended a PPI. Abdominal ultrasound showed cholelithiasis but no acute findings. The plan to do a colonoscopy as an outpatient.      DVT prophylaxis: lovenox Family Communication:neon Status is: Inpatient  Remains inpatient appropriate because:Hemodynamically unstable  Dispo: The patient is from: Home              Anticipated d/c is to: Home              Patient currently is not medically stable to d/c.   Difficult to place patient No        Code Status:     Code Status Orders  (From admission, onward)           Start     Ordered   08/20/21 0126  Full code  Continuous        08/20/21 0128           Code Status History     Date Active Date Inactive Code Status Order ID Comments User Context   08/19/2021 0045 08/20/2021 0029 Full Code [id number]  Clance Boll, MD ED         IV Access:   Peripheral IV   Procedures and diagnostic studies:  No results found.   Medical Consultants:   None.   Subjective:    Daisean Eardley no complaints today.  Objective:    Vitals:   09/09/21 0011 09/09/21 0457 09/09/21 0831 09/09/21 0838  BP:  102/65  116/81  Pulse: 82 80  81  Resp:  18    Temp:  97.7 F (36.5 C)    TempSrc:  Oral    SpO2: 100% 100% 100%   Weight:  55.5 kg    Height:       SpO2: 100 % O2 Flow Rate (L/min): 1 L/min FiO2 (%): 35 %   Intake/Output  Summary (Last 24 hours) at 09/09/2021 0945 Last data filed at 09/09/2021 0942 Gross per 24 hour  Intake 6 ml  Output 1400 ml  Net -1394 ml    Filed Weights   09/07/21 0500 09/08/21 0500 09/09/21 0457  Weight: 54.9 kg 55.3 kg 55.5 kg    Exam: General exam: In no acute distress. Respiratory system: Good air movement and clear to auscultation.  Pain is reproducible by palpation Cardiovascular system: S1 & S2 heard, RRR. No JVD. Gastrointestinal system: Abdomen is nondistended, soft and nontender.  Extremities: No pedal edema. Skin: No rashes, lesions or ulcers Psychiatry: Judgement and insight appear normal. Mood & affect appropriate.    Data Reviewed:    Labs: Basic Metabolic Panel: Recent Labs  Lab 09/03/21 0357 09/04/21 0339 09/05/21 0926  NA 137 138 138  K 3.7 3.6 3.9  CL 92* 94* 97*  CO2 37* 38* 36*  GLUCOSE 78 77 81  BUN _0 CREATININE 1.03 1.00 0.87  CALCIUM 9.0 9.0 8.7*    GFR Estimated Creatinine Clearance: 64.7 mL/min (by C-G formula based on SCr of 0.87 mg/dL). Liver Function Tests: No results for input(s): AST, ALT, ALKPHOS, BILITOT, PROT, ALBUMIN in the last 168 hours. No results for input(s): LIPASE, AMYLASE in the last 168 hours. No results for input(s): AMMONIA in the last 168 hours. Coagulation profile No results for input(s): INR, PROTIME in the last 168 hours. COVID-19 Labs  No results for input(s): DDIMER, FERRITIN, LDH, CRP in the last 72 hours.  Lab Results  Component Value Date   SARSCOV2NAA NEGATIVE 08/18/2021   [place] NEGATIVE 12/30/2020    CBC: Recent Labs  Lab 09/05/21 0926  WBC 4.6  NEUTROABS 3.1  HGB 9.6*  HCT 31.3*  MCV 92.6  PLT 263    Cardiac Enzymes: No results for input(s): CKTOTAL, CKMB, CKMBINDEX, TROPONINI in the last 168 hours. BNP (last 3 results) No results for input(s): PROBNP in the last 8760 hours. CBG: Recent Labs  Lab 09/02/21 1103 09/03/21 0147 09/03/21 1123 09/04/21 0819  GLUCAP  122* 145* 77 88    D-Dimer: No results for input(s): DDIMER in the last 72 hours. Hgb A1c: No results for input(s): HGBA1C in the last 72 hours. Lipid Profile: No results for input(s): CHOL, HDL, LDLCALC, TRIG, CHOLHDL, LDLDIRECT in the last 72 hours. Thyroid function studies: No results for input(s): TSH, T4TOTAL, T3FREE, THYROIDAB in the last 72 hours.  Invalid input(s): FREET3 Anemia work up: No results for input(s): VITAMINB12, FOLATE, FERRITIN, TIBC, IRON, RETICCTPCT in the last 72 hours. Sepsis Labs: Recent Labs  Lab 09/05/21 0926  WBC 4.6    Microbiology No results found for this or any previous visit (from the past 240 hour(s)).   Medications:    aspirin  81 mg Oral Daily   carvedilol  3.125 mg Oral BID WC   enoxaparin (  LOVENOX) injection  40 mg Subcutaneous Q24H   furosemide  40 mg Oral Daily   influenza vaccine adjuvanted  0.5 mL Intramuscular Tomorrow-1000   lisinopril  5 mg Oral Daily   mouth rinse  15 mL Mouth Rinse BID   pantoprazole  40 mg Oral Daily   polyethylene glycol  17 g Oral Daily   senna-docusate  1 tablet Oral BID   simvastatin  20 mg Oral QHS   sodium chloride flush  3 mL Intravenous Q12H   umeclidinium bromide  1 puff Inhalation Daily   Continuous Infusions:  sodium chloride        LOS: 20 days   Charlynne Cousins  [facility]  09/09/2021, 9:45 AM

## 2021-09-09 NOTE — TOC CM/SW Note (Signed)
HF TOC CM contacted [place], Thedore Mins to work on BIPAP order for dc. Will work on order. Pt was denied by insurance for Trilogy. [place], Heart Failure TOC CM [phone]

## 2021-09-10 NOTE — Progress Notes (Signed)
[facility] PROGRESS NOTE    Progress Note  [name] [name]  TLX:[id number] DOB: 05/14/1951 DOA: 08/20/2021 PCP: Patient, No Pcp Per (Inactive)     Brief Narrative:   [name] [name] is an 70 y.o. 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  Transferred to triad services on 08/25/2021, was noted to have dysphagia GI was consulted underwent EGD  Pulmonary recommended trilogy vent awaiting insurance approval for trilogy vent.   Assessment/Plan:   Acute on chronic respiratory failure with hypoxia and hypercarbia in the setting of pleural effusion and COPD: He is currently requiring 3 L oxygen and BiPAP at night as needed. Patient has severe emphysema with a history of squamous cell carcinoma. Pulmonary was consulted recommended BiPAP trilogy at night. Status post 2 thoracocentesis repeated chest x-ray done on 08/31/2021 was unchanged from previous, this was does not patient Complaining of shortness of breath.  It was discussed with PCCM they recommended no further recommendation except for trilogy. Awaiting trilogy insurance authorization and delivery of equipment.  Pulmonary hypertension group 3/right-sided heart failure: In the setting of severe COPD, 2D echo showed mild right ventricular strain underwent right heart cath, cardiology recommended oxygen and to restart his Lasix.  Hypotension in the setting of shock: Resolved initially required Levophed but now  resolved. Midodrine has been discontinued.  Hyperlipidemia: Excellent continue statins.  Essential hypertension: Restarted on Coreg and lisinopril low-dose. HCTZ discontinued.  Continue current dose of Lasix as an outpatient.  Normocytic anemia: No signs of overt bleeding follow-up with PCP as an outpatient.  Generalized abdominal pain/constipation: He was on a bowel regimen now resolved.  Unintentional weight loss and dysphagia: Patient had a barium swallow esophagus that showed no strictures but pills did not pass into the stomach. GI was consulted who found dilation of esophagus and they recommended a PPI. Abdominal ultrasound showed cholelithiasis but no acute findings. The plan to do a colonoscopy as an outpatient.      DVT prophylaxis: lovenox Family Communication:neon Status is: Inpatient  Remains inpatient appropriate because:Hemodynamically unstable  Dispo: The patient is from: Home              Anticipated d/c is to: Home              Patient currently is not medically stable to d/c.   Difficult to place patient No        Code Status:     Code Status Orders  (From admission, onward)           Start     Ordered   08/20/21 0126  Full code  Continuous        08/20/21 0128           Code Status History     Date Active Date Inactive Code Status Order ID Comments User Context   08/19/2021 0045 08/20/2021 0029 Full Code [id number]  Clance Boll, MD ED         IV Access:   Peripheral IV   Procedures and diagnostic  studies:   No results found.   Medical Consultants:   None.   Subjective:    [name] [name] no complaints today.  Objective:    Vitals:   09/10/21 0024 09/10/21 0445 09/10/21 0845 09/10/21 0903  BP:  112/70 139/86 139/86  Pulse:  74 90 83  Resp: (!) _0 Temp:  98.2 F (36.8 C)    TempSrc:  Oral    SpO2:  100%  98%  Weight:  56 kg    Height:       SpO2: 98 % O2 Flow Rate (L/min): 1 L/min FiO2  (%): 35 %   Intake/Output Summary (Last 24 hours) at 09/10/2021 1011 Last data filed at 09/10/2021 0800 Gross per 24 hour  Intake 600 ml  Output 850 ml  Net -250 ml    Filed Weights   09/08/21 0500 09/09/21 0457 09/10/21 0445  Weight: 55.3 kg 55.5 kg 56 kg    Exam: General exam: In no acute distress. Respiratory system: Good air movement and clear to auscultation.  Pain is reproducible by palpation Cardiovascular system: S1 & S2 heard, RRR. No JVD. Gastrointestinal system: Abdomen is nondistended, soft and nontender.  Extremities: No pedal edema. Skin: No rashes, lesions or ulcers Psychiatry: Judgement and insight appear normal. Mood & affect appropriate.    Data Reviewed:    Labs: Basic Metabolic Panel: Recent Labs  Lab 09/04/21 0339 09/05/21 0926  NA 138 138  K 3.6 3.9  CL 94* 97*  CO2 38* 36*  GLUCOSE 77 81  BUN 11 8  CREATININE 1.00 0.87  CALCIUM 9.0 8.7*    GFR Estimated Creatinine Clearance: 65.3 mL/min (by C-G formula based on SCr of 0.87 mg/dL). Liver Function Tests: No results for input(s): AST, ALT, ALKPHOS, BILITOT, PROT, ALBUMIN in the last 168 hours. No results for input(s): LIPASE, AMYLASE in the last 168 hours. No results for input(s): AMMONIA in the last 168 hours. Coagulation profile No results for input(s): INR, PROTIME in the last 168 hours. COVID-19 Labs  No results for input(s): DDIMER, FERRITIN, LDH, CRP in the last 72 hours.  Lab Results  Component Value Date   SARSCOV2NAA NEGATIVE 08/18/2021   [place] NEGATIVE 12/30/2020    CBC: Recent Labs  Lab 09/05/21 0926  WBC 4.6  NEUTROABS 3.1  HGB 9.6*  HCT 31.3*  MCV 92.6  PLT 263    Cardiac Enzymes: No results for input(s): CKTOTAL, CKMB, CKMBINDEX, TROPONINI in the last 168 hours. BNP (last 3 results) No results for input(s): PROBNP in the last 8760 hours. CBG: Recent Labs  Lab 09/03/21 1123 09/04/21 0819  GLUCAP 77 88    D-Dimer: No results for input(s):  DDIMER in the last 72 hours. Hgb A1c: No results for input(s): HGBA1C in the last 72 hours. Lipid Profile: No results for input(s): CHOL, HDL, LDLCALC, TRIG, CHOLHDL, LDLDIRECT in the last 72 hours. Thyroid function studies: No results for input(s): TSH, T4TOTAL, T3FREE, THYROIDAB in the last 72 hours.  Invalid input(s): FREET3 Anemia work up: No results for input(s): VITAMINB12, FOLATE, FERRITIN, TIBC, IRON, RETICCTPCT in the last 72 hours. Sepsis Labs: Recent Labs  Lab 09/05/21 0926  WBC 4.6    Microbiology No results found for this or any previous visit (from the past 240 hour(s)).   Medications:    aspirin  81 mg Oral Daily   carvedilol  3.125 mg Oral BID WC   enoxaparin (LOVENOX) injection  40 mg Subcutaneous Q24H   furosemide  40  mg Oral Daily   lisinopril  5 mg Oral Daily   mouth rinse  15 mL Mouth Rinse BID   pantoprazole  40 mg Oral Daily   polyethylene glycol  17 g Oral Daily   senna-docusate  1 tablet Oral BID   simvastatin  20 mg Oral QHS   sodium chloride flush  3 mL Intravenous Q12H   umeclidinium bromide  1 puff Inhalation Daily   Continuous Infusions:  sodium chloride        LOS: 21 days   Charlynne Cousins  [facility]  09/10/2021, 10:11 AM

## 2021-09-11 NOTE — Progress Notes (Signed)
[facility] PROGRESS NOTE    Progress Note  [name] [name]  TLX:[id number] DOB: 05/14/1951 DOA: 08/20/2021 PCP: Patient, No Pcp Per (Inactive)     Brief Narrative:   [name] [name] is an 70 y.o. 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  Transferred to triad services on 08/25/2021, was noted to have dysphagia GI was consulted underwent EGD  Pulmonary recommended trilogy vent awaiting insurance approval for trilogy vent.   Assessment/Plan:   Acute on chronic respiratory failure with hypoxia and hypercarbia in the setting of pleural effusion and COPD: He is currently requiring 3 L oxygen and BiPAP at night as needed. Patient has severe emphysema with a history of squamous cell carcinoma. Pulmonary was consulted recommended BiPAP trilogy at night. Status post 2 thoracocentesis repeated chest x-ray done on 08/31/2021 was unchanged from previous, this was does not patient Complaining of shortness of breath.  It was discussed with PCCM they recommended no further recommendation except for trilogy. Awaiting trilogy insurance authorization and delivery of equipment.  Pulmonary hypertension group 3/right-sided heart failure: In the setting of severe COPD, 2D echo showed mild right ventricular strain underwent right heart cath, cardiology recommended oxygen and to restart his Lasix.  Hypotension in the setting of shock: Resolved initially required Levophed but now  resolved. Midodrine has been discontinued.  Hyperlipidemia: Excellent continue statins.  Essential hypertension: Restarted on Coreg and lisinopril low-dose. HCTZ discontinued.  Continue current dose of Lasix as an outpatient.  Normocytic anemia: No signs of overt bleeding follow-up with PCP as an outpatient.  Generalized abdominal pain/constipation: He was on a bowel regimen now resolved.  Unintentional weight loss and dysphagia: Patient had a barium swallow esophagus that showed no strictures but pills did not pass into the stomach. GI was consulted who found dilation of esophagus and they recommended a PPI. Abdominal ultrasound showed cholelithiasis but no acute findings. The plan to do a colonoscopy as an outpatient.      DVT prophylaxis: lovenox Family Communication:neon Status is: Inpatient  Remains inpatient appropriate because:Hemodynamically unstable  Dispo: The patient is from: Home              Anticipated d/c is to: Home              Patient currently is not medically stable to d/c.   Difficult to place patient No        Code Status:     Code Status Orders  (From admission, onward)           Start     Ordered   08/20/21 0126  Full code  Continuous        08/20/21 0128           Code Status History     Date Active Date Inactive Code Status Order ID Comments User Context   08/19/2021 0045 08/20/2021 0029 Full Code [id number]  Clance Boll, MD ED         IV Access:   Peripheral IV   Procedures and diagnostic  studies:   No results found.   Medical Consultants:   None.   Subjective:    [name] [name] no complaints today.  Objective:    Vitals:   09/10/21 2139 09/11/21 0009 09/11/21 0632 09/11/21 0800  BP: 130/74  91/61   Pulse: 90 81 73   Resp: _0 Temp: 98.1 F (36.7 C)  (!) 97.3 F (36.3 C)   TempSrc: Oral  Oral   SpO2: 98% 100% 100%   Weight:   55.3 kg   Height:       SpO2: 100 % O2 Flow Rate  (L/min): 1 L/min FiO2 (%): 35 %   Intake/Output Summary (Last 24 hours) at 09/11/2021 0921 Last data filed at 09/11/2021 0700 Gross per 24 hour  Intake --  Output 1450 ml  Net -1450 ml    Filed Weights   09/09/21 0457 09/10/21 0445 09/11/21 8177  Weight: 55.5 kg 56 kg 55.3 kg    Exam: General exam: In no acute distress. Respiratory system: Good air movement and clear to auscultation.  Pain is reproducible by palpation Cardiovascular system: S1 & S2 heard, RRR. No JVD. Gastrointestinal system: Abdomen is nondistended, soft and nontender.  Extremities: No pedal edema. Skin: No rashes, lesions or ulcers Psychiatry: Judgement and insight appear normal. Mood & affect appropriate.    Data Reviewed:    Labs: Basic Metabolic Panel: Recent Labs  Lab 09/05/21 0926  NA 138  K 3.9  CL 97*  CO2 36*  GLUCOSE 81  BUN 8  CREATININE 0.87  CALCIUM 8.7*    GFR Estimated Creatinine Clearance: 64.4 mL/min (by C-G formula based on SCr of 0.87 mg/dL). Liver Function Tests: No results for input(s): AST, ALT, ALKPHOS, BILITOT, PROT, ALBUMIN in the last 168 hours. No results for input(s): LIPASE, AMYLASE in the last 168 hours. No results for input(s): AMMONIA in the last 168 hours. Coagulation profile No results for input(s): INR, PROTIME in the last 168 hours. COVID-19 Labs  No results for input(s): DDIMER, FERRITIN, LDH, CRP in the last 72 hours.  Lab Results  Component Value Date   SARSCOV2NAA NEGATIVE 08/18/2021   [place] NEGATIVE 12/30/2020    CBC: Recent Labs  Lab 09/05/21 0926  WBC 4.6  NEUTROABS 3.1  HGB 9.6*  HCT 31.3*  MCV 92.6  PLT 263    Cardiac Enzymes: No results for input(s): CKTOTAL, CKMB, CKMBINDEX, TROPONINI in the last 168 hours. BNP (last 3 results) No results for input(s): PROBNP in the last 8760 hours. CBG: No results for input(s): GLUCAP in the last 168 hours.  D-Dimer: No results for input(s): DDIMER in the last 72 hours. Hgb  A1c: No results for input(s): HGBA1C in the last 72 hours. Lipid Profile: No results for input(s): CHOL, HDL, LDLCALC, TRIG, CHOLHDL, LDLDIRECT in the last 72 hours. Thyroid function studies: No results for input(s): TSH, T4TOTAL, T3FREE, THYROIDAB in the last 72 hours.  Invalid input(s): FREET3 Anemia work up: No results for input(s): VITAMINB12, FOLATE, FERRITIN, TIBC, IRON, RETICCTPCT in the last 72 hours. Sepsis Labs: Recent Labs  Lab 09/05/21 0926  WBC 4.6    Microbiology No results found for this or any previous visit (from the past 240 hour(s)).   Medications:    aspirin  81 mg Oral Daily   carvedilol  3.125 mg Oral BID WC   enoxaparin (LOVENOX) injection  40 mg Subcutaneous Q24H   furosemide  40 mg Oral Daily   lisinopril  5 mg Oral Daily  mouth rinse  15 mL Mouth Rinse BID   pantoprazole  40 mg Oral Daily   polyethylene glycol  17 g Oral Daily   senna-docusate  1 tablet Oral BID   simvastatin  20 mg Oral QHS   sodium chloride flush  3 mL Intravenous Q12H   umeclidinium bromide  1 puff Inhalation Daily   Continuous Infusions:  sodium chloride        LOS: 22 days   Charlynne Cousins  [facility]  09/11/2021, 9:21 AM

## 2021-09-12 NOTE — Progress Notes (Signed)
PT Cancellation Note  Patient Details Name: [name] [name] MRN: [id number] DOB: Jul 10, 1951   Cancelled Treatment:    Reason Eval/Treat Not Completed: (P) Other (comment) Pt reports not wanting to walk after dealing with insurance all day.  Nashaly Dorantes B. Migdalia Dk PT, DPT Acute Rehabilitation Services Pager [phone] Office [phone]    [place] 09/12/2021, 3:40 PM

## 2021-09-12 NOTE — Progress Notes (Signed)
OT Cancellation Note  Patient Details Name: [name] [name] MRN: [id number] DOB: [birth year]   Cancelled Treatment:    Reason Eval/Treat Not Completed: Other (comment) (Attempted to see pt, pt on phone with insurance company and refusing OT this date. Will return as schedule allows.)   Jackquline Denmark, OTS Acute Rehab Office: [phone]  [name] 09/12/2021, 3:11 PM

## 2021-09-12 NOTE — TOC CM/SW Note (Signed)
HF TOC CM spoke to pt and he is agreeable to Bipap for home. Provided pt with copy of denial letter for Trilogy. Pt requested TOC CM send to his Pulm, Dr Lissa Merlin. States he wants to appeal and wants his Pulmonologist to help.Pt was on the phone with his insurance provided [facility] and provided them with Dr Lissa Merlin contact information. TOC CM faxed denial letter to Dr. Beecher Mcardle, pt has an appt with Pulm on 09/18/2021. [employer] will submit order for Bipap to [facility].    Attending updated. [place], Heart Failure TOC CM [phone]

## 2021-09-12 NOTE — Progress Notes (Signed)
[facility] PROGRESS NOTE    Progress Note  [name] [name]  TLX:[id number] DOB: 05/14/1951 DOA: 08/20/2021 PCP: Patient, No Pcp Per (Inactive)     Brief Narrative:   [name] [name] is an 70 y.o. 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  Transferred to [facility] services on 08/25/2021, was noted to have dysphagia GI was consulted underwent EGD  Pulmonary recommended trilogy vent awaiting insurance approval for trilogy vent.   Assessment/Plan:   Acute on chronic respiratory failure with hypoxia and hypercarbia in the setting of pleural effusion and COPD: He is currently requiring 3 L oxygen and BiPAP at night as needed. Patient has severe emphysema with a history of squamous cell carcinoma. Pulmonary was consulted recommended BiPAP trilogy at night. Status post 2 thoracocentesis repeated chest x-ray done on 08/31/2021 was unchanged from previous, this was does not patient Complaining of shortness of breath.  It was discussed with PCCM they recommended no further recommendation except for trilogy. Awaiting trilogy insurance authorization and delivery of equipment.  Pulmonary hypertension group 3/right-sided heart failure: In the setting of severe COPD, 2D echo showed mild right ventricular strain underwent right heart cath, cardiology recommended oxygen and to restart his Lasix.  Hypotension in the setting of shock: Resolved initially required Levophed but now  resolved. Midodrine has been discontinued.  Hyperlipidemia: Excellent continue statins.  Essential hypertension: Restarted on Coreg and lisinopril low-dose. HCTZ discontinued.  Continue current dose of Lasix as an outpatient.  Normocytic anemia: No signs of overt bleeding follow-up with PCP as an outpatient.  Generalized abdominal pain/constipation: He was on a bowel regimen now resolved.  Unintentional weight loss and dysphagia: Patient had a barium swallow esophagus that showed no strictures but pills did not pass into the stomach. GI was consulted who found dilation of esophagus and they recommended a PPI. Abdominal ultrasound showed cholelithiasis but no acute findings. The plan to do a colonoscopy as an outpatient.      DVT prophylaxis: lovenox Family Communication:neon Status is: Inpatient  Remains inpatient appropriate because:Hemodynamically unstable  Dispo: The patient is from: Home              Anticipated d/c is to: Home              Patient currently is not medically stable to d/c.   Difficult to place patient No        Code Status:     Code Status Orders  (From admission, onward)           Start     Ordered   08/20/21 0126  Full code  Continuous        08/20/21 0128           Code Status History     Date Active Date Inactive Code Status Order ID Comments User Context   08/19/2021 0045 08/20/2021 0029 Full Code [id number]  Clance Boll, MD ED         IV Access:   Peripheral IV   Procedures and diagnostic  studies:   No results found.   Medical Consultants:   None.   Subjective:    [name] [name] no complaints today.  Objective:    Vitals:   09/11/21 2104 09/12/21 0300 09/12/21 0535 09/12/21 0901  BP: (!) 155/87  113/68   Pulse: 96 73 79   Resp: 18  18   Temp: 97.8 F (36.6 C)  (!) 97.1 F (36.2 C)   TempSrc: Oral  Oral   SpO2: 98% 100% 100% 100%  Weight:   55.2 kg   Height:       SpO2: 100 % O2 Flow  Rate (L/min): 1 L/min FiO2 (%): 35 %   Intake/Output Summary (Last 24 hours) at 09/12/2021 0940 Last data filed at 09/12/2021 0114 Gross per 24 hour  Intake 120 ml  Output 1050 ml  Net -930 ml    Filed Weights   09/10/21 0445 09/11/21 0632 09/12/21 0535  Weight: 56 kg 55.3 kg 55.2 kg    Exam: General exam: In no acute distress. Respiratory system: Good air movement and clear to auscultation.  Pain is reproducible by palpation Cardiovascular system: S1 & S2 heard, RRR. No JVD. Gastrointestinal system: Abdomen is nondistended, soft and nontender.  Extremities: No pedal edema. Skin: No rashes, lesions or ulcers Psychiatry: Judgement and insight appear normal. Mood & affect appropriate.    Data Reviewed:    Labs: Basic Metabolic Panel: No results for input(s): NA, K, CL, CO2, GLUCOSE, BUN, CREATININE, CALCIUM, MG, PHOS in the last 168 hours.  GFR Estimated Creatinine Clearance: 64.3 mL/min (by C-G formula based on SCr of 0.87 mg/dL). Liver Function Tests: No results for input(s): AST, ALT, ALKPHOS, BILITOT, PROT, ALBUMIN in the last 168 hours. No results for input(s): LIPASE, AMYLASE in the last 168 hours. No results for input(s): AMMONIA in the last 168 hours. Coagulation profile No results for input(s): INR, PROTIME in the last 168 hours. COVID-19 Labs  No results for input(s): DDIMER, FERRITIN, LDH, CRP in the last 72 hours.  Lab Results  Component Value Date   SARSCOV2NAA NEGATIVE 08/18/2021   [place] NEGATIVE 12/30/2020    CBC: No results for input(s): WBC, NEUTROABS, HGB, HCT, MCV, PLT in the last 168 hours.  Cardiac Enzymes: No results for input(s): CKTOTAL, CKMB, CKMBINDEX, TROPONINI in the last 168 hours. BNP (last 3 results) No results for input(s): PROBNP in the last 8760 hours. CBG: No results for input(s): GLUCAP in the last 168 hours.  D-Dimer: No results for input(s): DDIMER in the last 72 hours. Hgb A1c: No results for input(s): HGBA1C in  the last 72 hours. Lipid Profile: No results for input(s): CHOL, HDL, LDLCALC, TRIG, CHOLHDL, LDLDIRECT in the last 72 hours. Thyroid function studies: No results for input(s): TSH, T4TOTAL, T3FREE, THYROIDAB in the last 72 hours.  Invalid input(s): FREET3 Anemia work up: No results for input(s): VITAMINB12, FOLATE, FERRITIN, TIBC, IRON, RETICCTPCT in the last 72 hours. Sepsis Labs: No results for input(s): PROCALCITON, WBC, LATICACIDVEN in the last 168 hours.  Microbiology No results found for this or any previous visit (from the past 240 hour(s)).   Medications:    aspirin  81 mg Oral Daily   carvedilol  3.125 mg Oral BID WC   enoxaparin (LOVENOX) injection  40 mg Subcutaneous Q24H   furosemide  40 mg Oral Daily   lisinopril  5 mg Oral Daily   mouth rinse  15 mL Mouth Rinse BID   pantoprazole  40 mg Oral Daily   polyethylene glycol  17 g Oral Daily   senna-docusate  1 tablet Oral BID   simvastatin  20 mg Oral QHS   sodium chloride flush  3 mL Intravenous Q12H   umeclidinium bromide  1 puff Inhalation Daily   Continuous Infusions:  sodium chloride        LOS: 23 days   Charlynne Cousins  [facility]  09/12/2021, 9:40 AM

## 2021-09-12 NOTE — TOC CM/SW Note (Signed)
Important: This notice explains your right to appeal our decision. Read this notice carefully. If you need help, you can call one of the numbers listed on the last page under "Get help & more information."  Notice of Denial of Medical Coverage    Date: September 09, 2021 Member number: [id number] Name: [name] [name]  Your request was denied We've denied the medical services/items listed below requested by you or your doctor: Home ventilator Why did we deny your request? We denied the medical services/items listed above because:  Your provider asked for a home ventilator. You have chronic respiratory failure. This is a condition that causes trouble breathing. Our physician reviewer looked at your records (office visit notes, test results, treatment). The request can be approved if your records show you meet Medicare criteria for lung devices. Your records must show you meet these criteria:  Your records must show that your breathing care cannot be given using a different kind of breathing machine called a positive airway pressure (CPAP or BiPAP) machine Your records must show that a BiPAP (bi-level positive airway pressure) breathing machine that comes with certain functions (called back up rate or volume assured pressure support) will not work for you Your provider must give the exact reason a ventilator is needed for your care instead of a bi-level positive airway pressure (BiPAP) breathing machine with a special function (called volume assured pressure support or VAPS)  Unfortunately, the request cannot be approved at this time by Medicare or your health plan. You may meet criteria for a different type of breathing machine.  This decision is based on: National Coverage Determination (NCD) for Durable Medical Equipment Reference List (280.1) [place] [employer] [place] Correct Coding and Coverage of Ventilators - Revised July 2020  You should share a copy of this decision with your doctor so you and  your doctor can discuss next steps. If your doctor requested coverage on your behalf, we have sent a copy of this decision to your doctor.  You have the right to appeal our decision You have the right to ask [place] 2 (HMO-POS) to review our decision by asking [place] for an appeal. Plan Appeal: Ask AARP Medicare Advantage Plan 2 (HMO-POS) for an appeal within 60 days of the date of this notice. We can give you more time if you have a good reason for missing the deadline. See section titled "How to ask for an appeal with [place] 2 (HMO-POS)" for information on how to ask for a plan level appeal.  If you want someone else to act for you You can name a relative, friend, attorney, doctor, or someone else to act as your representative. If you want someone else to act for you, call us at: [phone] to learn how to name your representative. TTY users call 711. Both you and the [place] you want to act for you must sign and date a statement confirming this is what you want. You'll need to mail or fax this statement to [place]. Keep a copy for your records.  Important Information About Your Appeal Rights There are 2 kinds of appeals with [place] 2 (HMO- POS)  Standard Appeal - We'll give you a written decision on a standard appeal within 30 days after we get your appeal. Our decision might take longer if you ask for an extension, or if we need more information about your case. We'll tell you if we're taking extra time and will explain why  more time is needed. If your appeal is for payment of a medical service/item you've already received, we'll give you a written decision within 60 days.  Fast Appeal - We'll give you a decision on a fast appeal within 72 hours after we get your appeal. You can ask for a fast appeal if you or your doctor believe your health could be seriously harmed by waiting up to 30 days for a decision. You cannot request an expedited  appeal if you are asking [place] to pay you back for a medical service/item you've already received.  We'll automatically give you a fast appeal if a doctor asks for you or if your doctor supports your request. If you ask for a fast appeal without support from a doctor, we'll decide if your request requires a fast appeal. If we don't give you a fast appeal, we'll give you a decision within 30 days.  How to ask for an appeal with [place] 2 (HMO-POS)  Step 1: You, your representative, or your doctor must ask [place] for an appeal. Your written request must include:  Your name Address Member number Reasons for appealing Whether you want a Standard or Fast Appeal (for a Fast Appeal, explain why you need one). Any evidence you want [place] to review, such as medical records, doctors' letters (such as a doctor's supporting statement if you request a fast appeal), or other information that explains why you need the medical service/item. Call your doctor if you need this information. If you're asking for an appeal and missed the deadline, you may ask for an extension and should include your reason for being late.  We recommend keeping a copy of everything you send [place] for your records. You can ask to see the medical records and other documents we used to make our decision before or during the appeal. At no cost to you, you can also ask for a copy of the guidelines we used to make our decision.  Step 2: Mail, fax, or deliver your appeal. You can also call us.  For a Standard Appeal: Mailing Address:  [place] 2 (HMO-POS) Appeals and Grievance Department Mail Stop: [phone] [place] [address] In [place] Delivery Address: [facility] and Grievance Department [address] [place] [address]  Fax: [phone] For a Fast Appeal: Phone: [phone] TTY Users Call: [phone] Fax: [phone]  What happens next? If you ask for an  appeal and we continue to deny your request for payment of a medical service/item, we'll automatically send your case to an independent reviewer. If the independent reviewer denies your request, the written decision will explain if you have additional appeal rights.  Get help & more information AARP Medicare Advantage Plan 2 (HMO-POS) Toll Free: [phone] TTY users call: 711, 24 hours a day, 7 days a week. www.myAARPMedicare.com 1-800-MEDICARE (1-207-016-1026), 24 hours, 7 days a week. TTY users call: (782) [phone] [place]: 1-888-HMO-9050 Elder Care Locator: [phone] or SecuredTickets.se to find help in your community  PRA Disclosure Statement According to the Paperwork Reduction Act of 1995, no persons are required to respond to a collection of information unless it displays a valid OMB control number. The valid OMB control number for this collection is 978-181-1077. The time required to complete this information collection is estimated to average 10 minutes per response, including the time to review instructions, search existing data resources, and gather the data needed, and complete and review the information collection. If  you have any comments concerning the accuracy of the time estimate(s) or suggestions for improving this form, please write to CMS, [place], Attn: PRA [address] [place], [place] [address].  CMS does not discriminate in its programs and activities. To request this publication in an alternative format, please call 1-800-MEDICARE or email: [email or web address].

## 2021-09-13 ENCOUNTER — Telehealth: Payer: Self-pay | Admitting: Pulmonary Disease

## 2021-09-13 NOTE — Telephone Encounter (Signed)
Spoke with the pt  He states Trilogy was recommended for him, but he was told that this has been denied and can use BIPAP only  Pt still currently admitted but wanted to make Dr Loanne Drilling aware  I called Melissa with [employer] to see if we can get some insight into what we can do to get him approved   Per Melissa- pt has [facility]  insurance and has to try and fail BIPAP first before he can get Trilogy

## 2021-09-13 NOTE — Progress Notes (Signed)
[facility] PROGRESS NOTE    Progress Note  [name] [name]  DTH:[id number] DOB: 1951/12/18 DOA: 08/20/2021 PCP: Patient, No Pcp Per (Inactive)     Brief Narrative:   [name] [name] is an 70 y.o. 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  Transferred to [facility] services on 08/25/2021, was noted to have dysphagia GI was consulted underwent EGD  Pulmonary recommended trilogy vent awaiting insurance approval BiPAP vent.   Assessment/Plan:   Acute on chronic respiratory failure with hypoxia and hypercarbia in the setting of pleural effusion and COPD: He is currently requiring 3 L oxygen and BiPAP at night as needed. Patient has severe emphysema with a history of squamous cell carcinoma. Pulmonary was consulted recommended BiPAP trilogy at night.  Which her insurance did not approve. Status post 2 thoracocentesis repeated chest x-ray done on 08/31/2021 was unchanged from previous, this was does not patient Complaining of shortness of breath.  It was discussed with PCCM they recommended no further recommendation except for trilogy. Awaiting a BiPAP machine delivery to the room in order to discharge him home.  Pulmonary hypertension group 3/right-sided heart failure: In the setting of severe COPD, 2D echo showed mild right ventricular strain underwent right heart cath, cardiology recommended oxygen and to restart his Lasix.  Hypotension in the setting of shock: Resolved  initially required Levophed but now resolved. Midodrine has been discontinued.  Hyperlipidemia: Excellent continue statins.  Essential hypertension: Restarted on Coreg and lisinopril low-dose. HCTZ discontinued.  Continue current dose of Lasix as an outpatient.  Normocytic anemia: No signs of overt bleeding follow-up with PCP as an outpatient.  Generalized abdominal pain/constipation: He was on a bowel regimen now resolved.  Unintentional weight loss and dysphagia: Patient had a barium swallow esophagus that showed no strictures but pills did not pass into the stomach. GI was consulted who found dilation of esophagus and they recommended a PPI. Abdominal ultrasound showed cholelithiasis but no acute findings. The plan to do a colonoscopy as an outpatient.      DVT prophylaxis: lovenox Family Communication:neon Status is: Inpatient  Remains inpatient appropriate because:Hemodynamically unstable  Dispo: The patient is from: Home              Anticipated d/c is to: Home              Patient currently is not medically stable to d/c.   Difficult to place patient No        Code Status:     Code Status Orders  (From admission, onward)           Start     Ordered   08/20/21 0126  Full code  Continuous        08/20/21 0128           Code Status History     Date Active Date Inactive Code Status Order ID Comments User Context   08/19/2021 0045 08/20/2021 0029 Full Code [id number]  Clance Boll, MD ED  IV Access:   Peripheral IV   Procedures and diagnostic studies:   No results found.   Medical Consultants:   None.   Subjective:    [name] no complaints  Objective:    Vitals:   09/13/21 0230 09/13/21 0508 09/13/21 0710 09/13/21 0808  BP:  118/82    Pulse: 83 78    Resp:  16    Temp:  98.3 F (36.8 C)    TempSrc:  Oral    SpO2: 100% 98% 100% 100%  Weight:  55.5 kg    Height:       SpO2: 100 % O2 Flow Rate  (L/min): 1 L/min FiO2 (%): 35 %   Intake/Output Summary (Last 24 hours) at 09/13/2021 0902 Last data filed at 09/13/2021 0509 Gross per 24 hour  Intake --  Output 1000 ml  Net -1000 ml    Filed Weights   09/11/21 0632 09/12/21 0535 09/13/21 0508  Weight: 55.3 kg 55.2 kg 55.5 kg    Exam: General exam: In no acute distress. Respiratory system: Good air movement and clear to auscultation. Cardiovascular system: S1 & S2 heard, RRR. No JVD. Gastrointestinal system: Abdomen is nondistended, soft and nontender.  Extremities: No pedal edema. Skin: No rashes, lesions or ulcers Psychiatry: Judgement and insight appear normal. Mood & affect appropriate.   Data Reviewed:    Labs: Basic Metabolic Panel: No results for input(s): NA, K, CL, CO2, GLUCOSE, BUN, CREATININE, CALCIUM, MG, PHOS in the last 168 hours.  GFR Estimated Creatinine Clearance: 64.7 mL/min (by C-G formula based on SCr of 0.87 mg/dL). Liver Function Tests: No results for input(s): AST, ALT, ALKPHOS, BILITOT, PROT, ALBUMIN in the last 168 hours. No results for input(s): LIPASE, AMYLASE in the last 168 hours. No results for input(s): AMMONIA in the last 168 hours. Coagulation profile No results for input(s): INR, PROTIME in the last 168 hours. COVID-19 Labs  No results for input(s): DDIMER, FERRITIN, LDH, CRP in the last 72 hours.  Lab Results  Component Value Date   SARSCOV2NAA NEGATIVE 08/18/2021   [place] NEGATIVE 12/30/2020    CBC: No results for input(s): WBC, NEUTROABS, HGB, HCT, MCV, PLT in the last 168 hours.  Cardiac Enzymes: No results for input(s): CKTOTAL, CKMB, CKMBINDEX, TROPONINI in the last 168 hours. BNP (last 3 results) No results for input(s): PROBNP in the last 8760 hours. CBG: No results for input(s): GLUCAP in the last 168 hours.  D-Dimer: No results for input(s): DDIMER in the last 72 hours. Hgb A1c: No results for input(s): HGBA1C in the last 72 hours. Lipid Profile: No  results for input(s): CHOL, HDL, LDLCALC, TRIG, CHOLHDL, LDLDIRECT in the last 72 hours. Thyroid function studies: No results for input(s): TSH, T4TOTAL, T3FREE, THYROIDAB in the last 72 hours.  Invalid input(s): FREET3 Anemia work up: No results for input(s): VITAMINB12, FOLATE, FERRITIN, TIBC, IRON, RETICCTPCT in the last 72 hours. Sepsis Labs: No results for input(s): PROCALCITON, WBC, LATICACIDVEN in the last 168 hours.  Microbiology No results found for this or any previous visit (from the past 240 hour(s)).   Medications:    aspirin  81 mg Oral Daily   carvedilol  3.125 mg Oral BID WC   enoxaparin (LOVENOX) injection  40 mg Subcutaneous Q24H   furosemide  40 mg Oral Daily   lisinopril  5 mg Oral Daily   mouth rinse  15 mL Mouth Rinse BID   pantoprazole  40 mg Oral Daily   polyethylene glycol  17 g  Oral Daily   senna-docusate  1 tablet Oral BID   simvastatin  20 mg Oral QHS   sodium chloride flush  3 mL Intravenous Q12H   umeclidinium bromide  1 puff Inhalation Daily   Continuous Infusions:  sodium chloride        LOS: 24 days   Charlynne Cousins  [facility]  09/13/2021, 9:02 AM

## 2021-09-13 NOTE — Progress Notes (Signed)
PT Cancellation Note  Patient Details Name: [name] [name] MRN: [id number] DOB: 04/11/1951   Cancelled Treatment:    Reason Eval/Treat Not Completed: (P) Other (comment) Attempted x2 however pt on hold with insurance both times attempting to get approval for Trilogy vent system. PT will follow back tomorrow for treatment.   Waylynn Benefiel B. Migdalia Dk PT, DPT Acute Rehabilitation Services Pager [phone] Office [phone]     [place] 09/13/2021, 2:59 PM

## 2021-09-14 DIAGNOSIS — E43 Unspecified severe protein-calorie malnutrition: Secondary | ICD-10-CM | POA: Insufficient documentation

## 2021-09-14 MED ORDER — BOOST PLUS PO LIQD
237.0000 mL | Freq: Two times a day (BID) | ORAL | Status: DC
  Administered 2021-09-14 – 2021-09-16 (×4): 237 mL via ORAL
  Filled 2021-09-14 (×6): qty 237

## 2021-09-14 NOTE — Telephone Encounter (Signed)
Pt is asking to speak w/ Dr. Loanne Drilling about this & can be reached at [phone].

## 2021-09-14 NOTE — Progress Notes (Signed)
PT Cancellation Note  Patient Details Name: Eathan Groman MRN: [id number] DOB: [birth year]   Cancelled Treatment:    Reason Eval/Treat Not Completed: (P) Patient declined, no reason specified Pt waiting on breakfast, request PT follow back later this morning.  Kalaysia Demonbreun B. Migdalia Dk PT, DPT Acute Rehabilitation Services Pager [phone] Office [phone]   [place] 09/14/2021, 9:57 AM

## 2021-09-14 NOTE — TOC CM/SW Note (Signed)
HF TOC CM received notification from [place] that pt was approved for Bipap and they can arrange to have delivered today. CM notified attending. TOC CM contacted Dr Loanne Drilling office and left message with MD RN team to send message that pt wanted to discuss with her the denial of Trilogy. Pt does not want to discharge with Bipap. [place], Heart Failure TOC CM [phone]

## 2021-09-14 NOTE — Progress Notes (Signed)
Initial Nutrition Assessment  DOCUMENTATION CODES:   Severe malnutrition in context of chronic illness, Underweight  INTERVENTION:   Boost Plus PO TID, each supplement provides 360 kcal and 14 gm protein  NUTRITION DIAGNOSIS:   Severe Malnutrition related to chronic illness (COPD, new CHF) as evidenced by severe muscle depletion, severe fat depletion.  GOAL:   Patient will meet greater than or equal to 90% of their needs  MONITOR:   PO intake, Supplement acceptance, Labs  REASON FOR ASSESSMENT:   LOS    ASSESSMENT:   70 yo male admitted with pulmonary hypertension. PMH includes HLD, HTN, lung CA, COPD, CHF, DM.  9/7 - S/P right heart cath. 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  Patient is awaiting BiPAP machine delivery before being discharged. Patient is hopeful for discharge home soon.   Patient reports good intake of meals. He eats everything he is sent on his trays. He says he lost some weight 8-9 months ago and but has been stable since then. He drinks Boost Plus at home when he can afford it. Will add supplement BID between meals. Discussed the importance of adequate protein and calorie intake to prevent further depletion of LBM.  Weight history reviewed. Patient weighed 58.1 kg on 04/20/20, 57.2 kg on 08/18/21, currently 54.3 kg. 5% weight loss within the past month is severe.  Labs reviewed.  Medications reviewed and include Lasix, Miralax, Protonix, Senokot-S.  NUTRITION - FOCUSED PHYSICAL EXAM:  Flowsheet Row Most Recent Value  Orbital Region Severe depletion  Upper Arm Region Severe depletion  Thoracic and Lumbar Region Severe depletion  Buccal Region Severe depletion  Temple Region Moderate depletion  Clavicle Bone Region Severe depletion  Clavicle and Acromion Bone Region Severe depletion  Scapular Bone Region Severe depletion  Dorsal Hand Severe depletion  Patellar Region Unable to assess   Anterior Thigh Region Unable to assess  Posterior Calf Region Unable to assess  Edema (RD Assessment) Unable to assess  Hair Reviewed  Eyes Reviewed  Mouth Reviewed  Skin Reviewed  Nails Reviewed       Diet Order:   Diet Order             DIET SOFT Room service appropriate? Yes; Fluid consistency: Thin  Diet effective now                   EDUCATION NEEDS:   Education needs have been addressed  Skin:  Skin Assessment: Reviewed RN Assessment  Last BM:  9/27  Height:   Ht Readings from Last 1 Encounters:  09/01/21 _0  (1.753 m)    Weight:   Wt Readings from Last 1 Encounters:  09/14/21 54.3 kg    BMI:  Body mass index is 17.68 kg/m.  Estimated Nutritional Needs:   Kcal:  1900-2100  Protein:  80-100 gm  Fluid:  1.8-2 L    Lucas Mallow, RD, LDN, CNSC Please refer to Amion for contact information.

## 2021-09-14 NOTE — Progress Notes (Signed)
Occupational Therapy Treatment Patient Details Name: [name] [name] MRN: [id number] DOB: 1951/06/27 Today's Date: 09/14/2021   History of present illness 70 yo male presents to [facility] 9/1 with SOB, LE edema, bloating. CT chest shows L large pleural effusion, s/p thoracentesis x2 on 9/2 and 9/6, first yielding 900 cc and second yileding 500 cc. Required transfer to ICU for shock, now resolved. Current work up for acute on chronic hypercapnic respiratory failure in setting of emphysema and history of SCC, pulmonary hypertension/R HF. s/p RHC 9/7. 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   OT comments  Treatment focused on reiterating energy conservation principles as well as education in regards disease process. Patient has EC handout from prior treatment.Patient had many questions that therapist answered in regards to exercise, activity and breathing. Therapist instructed on pursed lipped breathing technique. Patient is independent in room with ADLs and ambulation. Therapist recommended shower chair. Patient has no further acute OT needs.   Recommendations for follow up therapy are one component of a multi-disciplinary discharge planning process, led by the attending physician.  Recommendations may be updated based on patient status, additional functional criteria and insurance authorization.    Follow Up Recommendations  No OT follow up (Pulmonary Rehab)    Equipment Recommendations  None recommended by OT    Recommendations for Other Services      Precautions / Restrictions Precautions Precautions: Other (comment) Precaution Comments: monitor O2 Restrictions Weight Bearing Restrictions: No       Mobility Bed Mobility               General bed mobility comments: Pt sitting in recliner upon arrival.    Transfers Overall transfer level: Independent                General transfer comment: therapist observed patient ambulate in hall with PT.    Balance Overall balance assessment: No apparent balance deficits (not formally assessed) Sitting-balance support: Feet supported Sitting balance-[name] Scale: Good     Standing balance support: No upper extremity supported Standing balance-[name] Scale: Good                             ADL either performed or assessed with clinical judgement   ADL                                         General ADL Comments: Patient demonstrates ability to perform Lb dressing without difficulty. Reports he is independent with ADLs - just needing assistance for line/lead management.     Vision Baseline Vision/History: 1 Wears glasses Patient Visual Report: No change from baseline     Perception     Praxis      Cognition Arousal/Alertness: Awake/alert Behavior During Therapy: WFL for tasks assessed/performed Overall Cognitive Status: Within Functional Limits for tasks assessed                                 General Comments: pleasant and cooperative, very receptive to education about purse lip breathing        Exercises     Shoulder Instructions       General Comments Ambulated on 2L O2 via [place] and able to maintain SaO2  96%O2 with purse lip breathing,    Pertinent Vitals/ Pain       Pain Assessment: No/denies pain  Home Living                                          Prior Functioning/Environment              Frequency           Progress Toward Goals  OT Goals(current goals can now be found in the care plan section)  Progress towards OT goals: Goals met and updated - see care plan  Acute Rehab OT Goals Patient Stated Goal: to go home and return to baseline  Plan All goals met and education completed, patient discharged from OT services    Co-evaluation                 AM-PAC OT "6 Clicks" Daily Activity      Outcome Measure   Help from another Amparo eating meals?: None Help from another [place] taking care of personal grooming?: None Help from another Reen toileting, which includes using toliet, bedpan, or urinal?: None Help from another [place] bathing (including washing, rinsing, drying)?: None Help from another Kerth to put on and taking off regular upper body clothing?: None Help from another Else to put on and taking off regular lower body clothing?: None 6 Click Score: 24    End of Session Equipment Utilized During Treatment: Oxygen  OT Visit Diagnosis: Muscle weakness (generalized) (M62.81);Pain   Activity Tolerance Patient tolerated treatment well   Patient Left in chair;with call bell/phone within reach   Nurse Communication  (okay to see)        Time: 0174-9449 OT Time Calculation (min): 16 min  Charges: OT General Charges $OT Visit: 1 Visit OT Treatments $Self Care/Home Management : 8-22 mins  Derl Barrow, OTR/L [place]  Office [phone] Pager: [place] 09/14/2021, 4:11 PM

## 2021-09-14 NOTE — Progress Notes (Signed)
Physical Therapy Treatment Patient Details Name: [name] [name] MRN: [id number] DOB: [birth year] Today's Date: 09/14/2021   History of Present Illness 70 yo male presents to [facility] 9/1 with SOB, LE edema, bloating. CT chest shows L large pleural effusion, s/p thoracentesis x2 on 9/2 and 9/6, first yielding 900 cc and second yileding 500 cc. Required transfer to ICU for shock, now resolved. Current work up for acute on chronic hypercapnic respiratory failure in setting of emphysema and history of SCC, pulmonary hypertension/R HF. s/p RHC 9/7. 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    PT Comments    Pt on phone trying to get primary care appointment at [facility]. Spent time on hold educating on COPD, need for energy conservation and continuing graded exercise. Pt verbalizes understanding and receptive to having OT speak about energy conservation. Pt on able to maintain SaO2 >96%O2 on 1.5 L O2 at rest and 2 L O2 with ambulation. Pt with good recovery during standing rest break with ambulation after appointment set. D/c plans remain appropriate at this time. PT will continue to follow acutely.     Recommendations for follow up therapy are one component of a multi-disciplinary discharge planning process, led by the attending physician.  Recommendations may be updated based on patient status, additional functional criteria and insurance authorization.  Follow Up Recommendations  No PT follow up;Supervision for mobility/OOB;Other (comment) (consider pulm rehab)     Equipment Recommendations  None recommended by PT       Precautions / Restrictions Precautions Precautions: Fall Precaution Comments: monitor O2 Restrictions Weight Bearing Restrictions: No     Mobility  Bed Mobility               General bed mobility comments: Pt sitting in recliner upon arrival.    Transfers Overall  transfer level: Independent               General transfer comment: standing at sink on entry  Ambulation/Gait Ambulation/Gait assistance: Modified independent (Device/Increase time) Gait Distance (Feet): 450 Feet Assistive device: None Gait Pattern/deviations: Step-through pattern;Narrow base of support Gait velocity: controlled, WFL Gait velocity interpretation: >2.62 ft/sec, indicative of community ambulatory General Gait Details: overall strong steady gait,much better use of purse lip breathing techniques, able to maintain SaO2 96%O2 on 2L O2 via [place] with 1x standing rest break.       Balance Overall balance assessment: No apparent balance deficits (not formally assessed) Sitting-balance support: Feet supported Sitting balance-[name] Scale: Good     Standing balance support: No upper extremity supported Standing balance-[name] Scale: Good                              Cognition Arousal/Alertness: Awake/alert Behavior During Therapy: WFL for tasks assessed/performed Overall Cognitive Status: Within Functional Limits for tasks assessed                                 General Comments: pleasant and cooperative, very receptive to education about purse lip breathing         General Comments General comments (skin integrity, edema, etc.): Ambulated on 2L O2 via [place] and able to maintain SaO2 96%O2 with purse lip breathing,      Pertinent Vitals/Pain Pain Assessment: No/denies pain     PT Goals (current goals can now be  found in the care plan section) Acute Rehab PT Goals Patient Stated Goal: to go home and return to baseline PT Goal Formulation: With patient Time For Goal Achievement: 09/10/21 Potential to Achieve Goals: Good Progress towards PT goals: Progressing toward goals    Frequency    Min 3X/week      PT Plan Current plan remains appropriate       AM-PAC PT "6 Clicks" Mobility   Outcome Measure  Help needed turning  from your back to your side while in a flat bed without using bedrails?: None Help needed moving from lying on your back to sitting on the side of a flat bed without using bedrails?: None Help needed moving to and from a bed to a chair (including a wheelchair)?: None Help needed standing up from a chair using your arms (e.g., wheelchair or bedside chair)?: None Help needed to walk in hospital room?: A Little Help needed climbing 3-5 steps with a railing? : A Little 6 Click Score: 22    End of Session Equipment Utilized During Treatment: Oxygen Activity Tolerance: Patient tolerated treatment well Patient left: with call bell/phone within reach (standing near recliner) Nurse Communication: Mobility status;Other (comment) (O2 requirments) PT Visit Diagnosis: Other abnormalities of gait and mobility (R26.89);Muscle weakness (generalized) (M62.81)     Time: 6222-9798 PT Time Calculation (min) (ACUTE ONLY): 12 min  Charges:  $Self Care/Home Management: 8-22                     Timia Casselman B. Migdalia Dk PT, DPT Acute Rehabilitation Services Pager [phone] Office [phone]    [place] 09/14/2021, 3:31 PM

## 2021-09-14 NOTE — Progress Notes (Signed)
OT Cancellation Note  Patient Details Name: Jahziel Sinn MRN: [id number] DOB: Sep 25, 1951   Cancelled Treatment:    Reason Eval/Treat Not Completed: Patient declined, no reason specified. Patient declined therapy asking therapist to come back this afternoon. Will f/u as able.  Lawrence Mitch L Jaikob Borgwardt 09/14/2021, 11:26 AM

## 2021-09-14 NOTE — Care Management Important Message (Signed)
Important Message  Patient Details  Name: [name] [name] MRN: [id number] Date of Birth: 06/22/1951   Medicare Important Message Given:  Yes     Shelda Altes 09/14/2021, 10:52 AM

## 2021-09-14 NOTE — Progress Notes (Signed)
[facility] PROGRESS NOTE    Progress Note  [name] [name]  KAJ:[id number] DOB: 11/30/1951 DOA: 08/20/2021 PCP: Patient, No Pcp Per (Inactive)     Brief Narrative:   [name] [name] is an 70 y.o. 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 who completed his chemotherapy presented to the cardiology clinic for evaluation of lower extremity edema and shortness of breath 2D echo noted to have severe pulmonary hypertension acute right-sided heart failure referred to the ED, CT of the chest showed a large left-sided pleural effusion IR perform thoracocentesis of 900 cc of thick purulent, thick calcified rind caseating fluid, transferred to the ICU to [facility] for evaluation by CT surgery.  Transferred to [facility] on 08/25/2021, was noted to have dysphagia GI was consulted underwent EGD  Pulmonary recommended trilogy vent awaiting insurance approval BiPAP vent.   Assessment/Plan:   Acute on chronic respiratory failure with hypoxia and hypercarbia  in the setting of pleural effusion and COPD: -Multifactorial, has advanced COPD, left pneumonectomy for squamous cell lung cancer and severe pulmonary hypertension -Admitted with pleural effusion, transudative, underwent thoracentesis x2 -Seen by pulmonary as well this admission, medically optimized -Pulmonary MD recommended Trilogy NIPPV  -Unfortunately Trilogy was not approved and BIPAP set up, patient is not convinced about this and wants to hear from pulm regarding the situation, sent message -Discharge planning, will need home health RN  Pulmonary hypertension group 3/right-sided heart failure: In the setting of severe COPD, 2D echo showed mild right ventricular strain underwent right heart cath, cardiology recommended oxygen -Continue p.o. Lasix  Hypotension in the setting of shock: Resolved initially required Levophed but now resolved. Midodrine has  been discontinued.  Hyperlipidemia: Excellent continue statins.  Essential hypertension: Restarted on Coreg and lisinopril low-dose. HCTZ discontinued.   -Continue current dose of Lasix as an outpatient.  Normocytic anemia: No signs of overt bleeding follow-up with PCP as an outpatient.  Generalized abdominal pain/constipation: He was on a bowel regimen now - resolved.  Unintentional weight loss and dysphagia: Patient had a barium swallow esophagus that showed no strictures but pills did not pass into the stomach. GI was consulted who found dilation of esophagus and they recommended a PPI. Abdominal ultrasound showed cholelithiasis but no acute findings. The plan to do a colonoscopy as an outpatient.    DVT prophylaxis: lovenox CODE STATUS: Full code Family Communication:none Status is: Inpatient  Remains inpatient appropriate because:Hemodynamically unstable  Dispo: The patient is from: Home              Anticipated d/c is to: Home              Patient currently is not medically stable to d/c.   Difficult to place patient No    Medical Consultants:   Pulmonary   Subjective:    [name] [name] feels okay, no new complaints, waiting to hear from pulmonary  Objective:    Vitals:   09/13/21 1953 09/14/21 0618 09/14/21 0840 09/14/21 1331  BP: 118/82 108/71  122/79  Pulse: 83 82  83  Resp: 17   19  Temp: 98 F (36.7 C) 98 F (36.7 C)  98.3 F (36.8 C)  TempSrc: Oral Oral  Oral  SpO2: 96% 100% 100% 97%  Weight:  54.3 kg    Height:       SpO2: 97 % O2 Flow Rate (L/min): 2 L/min FiO2 (%): 35 %   Intake/Output Summary (Last 24  hours) at 09/14/2021 1542 Last data filed at 09/14/2021 6812 Gross per 24 hour  Intake 240 ml  Output 550 ml  Net -310 ml   Filed Weights   09/12/21 0535 09/13/21 0508 09/14/21 0618  Weight: 55.2 kg 55.5 kg 54.3 kg    Exam: Gen: Awake, Alert, Oriented X 3,  HEENT: no JVD Lungs: Poor air movement otherwise clear CVS:  S1S2/RRR Abd: soft, Non tender, non distended, BS present Extremities: No edema Skin: No rashes, lesions or ulcers Psychiatry: Judgement and insight appear normal. Mood & affect appropriate.   Data Reviewed:    Labs: Basic Metabolic Panel: No results for input(s): NA, K, CL, CO2, GLUCOSE, BUN, CREATININE, CALCIUM, MG, PHOS in the last 168 hours.  GFR Estimated Creatinine Clearance: 63.3 mL/min (by C-G formula based on SCr of 0.87 mg/dL). Liver Function Tests: No results for input(s): AST, ALT, ALKPHOS, BILITOT, PROT, ALBUMIN in the last 168 hours. No results for input(s): LIPASE, AMYLASE in the last 168 hours. No results for input(s): AMMONIA in the last 168 hours. Coagulation profile No results for input(s): INR, PROTIME in the last 168 hours. COVID-19 Labs  No results for input(s): DDIMER, FERRITIN, LDH, CRP in the last 72 hours.  Lab Results  Component Value Date   SARSCOV2NAA NEGATIVE 08/18/2021   [place] NEGATIVE 12/30/2020    CBC: No results for input(s): WBC, NEUTROABS, HGB, HCT, MCV, PLT in the last 168 hours.  Cardiac Enzymes: No results for input(s): CKTOTAL, CKMB, CKMBINDEX, TROPONINI in the last 168 hours. BNP (last 3 results) No results for input(s): PROBNP in the last 8760 hours. CBG: No results for input(s): GLUCAP in the last 168 hours.  D-Dimer: No results for input(s): DDIMER in the last 72 hours. Hgb A1c: No results for input(s): HGBA1C in the last 72 hours. Lipid Profile: No results for input(s): CHOL, HDL, LDLCALC, TRIG, CHOLHDL, LDLDIRECT in the last 72 hours. Thyroid function studies: No results for input(s): TSH, T4TOTAL, T3FREE, THYROIDAB in the last 72 hours.  Invalid input(s): FREET3 Anemia work up: No results for input(s): VITAMINB12, FOLATE, FERRITIN, TIBC, IRON, RETICCTPCT in the last 72 hours. Sepsis Labs: No results for input(s): PROCALCITON, WBC, LATICACIDVEN in the last 168 hours.  Microbiology No results found for this  or any previous visit (from the past 240 hour(s)).   Medications:    aspirin  81 mg Oral Daily   carvedilol  3.125 mg Oral BID WC   enoxaparin (LOVENOX) injection  40 mg Subcutaneous Q24H   furosemide  40 mg Oral Daily   lactose free nutrition  237 mL Oral BID BM   lisinopril  5 mg Oral Daily   mouth rinse  15 mL Mouth Rinse BID   pantoprazole  40 mg Oral Daily   polyethylene glycol  17 g Oral Daily   senna-docusate  1 tablet Oral BID   simvastatin  20 mg Oral QHS   sodium chloride flush  3 mL Intravenous Q12H   umeclidinium bromide  1 puff Inhalation Daily   Continuous Infusions:  sodium chloride        LOS: 25 days   Domenic Polite  [facility]  09/14/2021, 3:42 PM

## 2021-09-14 NOTE — Telephone Encounter (Signed)
Called and spoke with patient to let him know that Dr. Loanne Drilling is off today and will be in the office tomorrow and Friday and I would forward message to her. Patient expressed understanding.

## 2021-09-15 ENCOUNTER — Other Ambulatory Visit (HOSPITAL_COMMUNITY): Payer: Self-pay

## 2021-09-15 DIAGNOSIS — J432 Centrilobular emphysema: Secondary | ICD-10-CM

## 2021-09-15 DIAGNOSIS — Z452 Encounter for adjustment and management of vascular access device: Secondary | ICD-10-CM

## 2021-09-15 LAB — BASIC METABOLIC PANEL
Anion gap: 5 (ref 5–15)
BUN: 16 mg/dL (ref 8–23)
CO2: 36 mmol/L — ABNORMAL HIGH (ref 22–32)
Calcium: 9 mg/dL (ref 8.9–10.3)
Chloride: 96 mmol/L — ABNORMAL LOW (ref 98–111)
Creatinine, Ser: 0.95 mg/dL (ref 0.61–1.24)
GFR, Estimated: >60 mL/min (ref >60)
Glucose, Bld: 87 mg/dL (ref 70–99)
Potassium: 3.8 mmol/L (ref 3.5–5.1)
Sodium: 137 mmol/L (ref 135–145)

## 2021-09-15 LAB — CBC
HCT: 31 % — ABNORMAL LOW (ref 39.0–52.0)
Hemoglobin: 9.5 g/dL — ABNORMAL LOW (ref 13.0–17.0)
MCH: 28 pg (ref 26.0–34.0)
MCHC: 30.6 g/dL (ref 30.0–36.0)
MCV: 91.4 fL (ref 80.0–100.0)
Platelets: 218 10*3/uL (ref 150–400)
RBC: 3.39 MIL/uL — ABNORMAL LOW (ref 4.22–5.81)
RDW: 13 % (ref 11.5–15.5)
WBC: 4.3 10*3/uL (ref 4.0–10.5)
nRBC: 0 % (ref 0.0–0.2)

## 2021-09-15 MED ORDER — SENNOSIDES-DOCUSATE SODIUM 8.6-50 MG PO TABS
1.0000 | ORAL_TABLET | Freq: Every evening | ORAL | 0 refills | Status: DC | PRN
  Filled 2021-09-15: qty 20, 20d supply, fill #0

## 2021-09-15 MED ORDER — LISINOPRIL 5 MG PO TABS
5.0000 mg | ORAL_TABLET | Freq: Every day | ORAL | 0 refills | Status: DC
  Filled 2021-09-15: qty 30, 30d supply, fill #0

## 2021-09-15 MED ORDER — FUROSEMIDE 40 MG PO TABS: 40.0000 mg | ORAL_TABLET | Freq: Every day | ORAL | 0 refills | Status: DC

## 2021-09-15 MED ORDER — CARVEDILOL 3.125 MG PO TABS: 3.1250 mg | ORAL_TABLET | Freq: Two times a day (BID) | ORAL | 0 refills | Status: DC

## 2021-09-15 MED ORDER — UMECLIDINIUM BROMIDE 62.5 MCG/INH IN AEPB
INHALATION_SPRAY | Freq: Every day | RESPIRATORY_TRACT | 0 refills | Status: AC
  Filled 2021-09-15: qty 30, 30d supply, fill #0

## 2021-09-15 NOTE — Telephone Encounter (Signed)
I have discussed plan with patient's hospitalist team (Dr. Broadus John) to provide prescription for Trilogy for [facility] per patient request. In the meantime, OK to discharge on BiPAP. I have personally called and updated patient on the plan on 09/14/21.

## 2021-09-15 NOTE — Progress Notes (Signed)
09/15/2021 Copy of trilogy order given to patient to take to his [place] provider.

## 2021-09-15 NOTE — Care Management (Signed)
ED RN Care Manager received call from [place] patient is being discharged with bipap trilogy and patient will also need home oxygen.   CM contacted daughter Lynelle [place] to check if home oxygen was delivered, according to daughter the only delivery was the trilogy to patient's room.  CM checked the  oxygen order which was just placed  at 1836 . ED CM explained that this cannot be processed at this hour it will need to be followed up by [facility] team tomorrow in the am.

## 2021-09-15 NOTE — Discharge Summary (Addendum)
Physician Discharge Summary  Oreste Majeed VHQ:[id number] DOB: 12/27/1950 DOA: 08/20/2021  PCP: Patient, No Pcp Per (Inactive)  Admit date: 08/20/2021 Discharge date: 09/16/2021  Time spent: 3mnutes  Recommendations for Outpatient Follow-up:  Pulmonary Dr. 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 who completed his chemotherapy presented to the cardiology clinic for evaluation of lower extremity edema and shortness of breath 2D echo noted to have severe pulmonary hypertension acute right-sided heart failure referred to the ED, CT of the chest showed a large left-sided pleural effusion  Hospital Course:   Acute on chronic respiratory failure with hypoxia and hypercarbia  in the setting of pleural effusion and COPD: -Multifactorial, has advanced COPD, left pneumonectomy for squamous cell lung cancer and severe pulmonary hypertension and large L effusion -Admitted with pleural effusion, transudative, underwent thoracentesis x2 -Seen by pulmonary as well this admission, medically optimized -Pulmonary MD recommended Trilogy NIPPV, noted  to have mild to moderate pulmonary hypertension, not felt to be a candidate for selective pulmonary artery vasodilators -Now stable on p.o. Lasix -Unfortunately Trilogy was not approved through his insurance provider and BIPAP set up at discharge instead, he was also given a prescription for trilogy at the time of discharge if this can be [facility], he will use BiPAP in the interim -Also set up with home health services -Close follow-up with Dr. ELoanne Drillingin 2 to 3 weeks  Pulmonary hypertension group 3/right-sided heart failure: In the setting of severe COPD, 2D echo showed mild right ventricular strain underwent right heart cath, cardiology recommended oxygen -Continue p.o. Lasix  Hypotension in the setting of shock: Resolved initially required Levophed but now resolved. Midodrine has been discontinued.  Hyperlipidemia: Excellent continue statins.  Essential hypertension: Restarted on Coreg and lisinopril low-dose. HCTZ discontinued.   -Continue current dose of Lasix as an outpatient.  Normocytic anemia: No signs of overt bleeding follow-up with PCP as an outpatient.  Generalized abdominal pain/constipation: He was on a bowel regimen now - resolved.  Unintentional weight loss and dysphagia: Patient had a barium swallow esophagus that showed no strictures but pills did not pass into the stomach. GI was consulted, underwent EGD noted to have dilation of esophagus and they recommended a PPI. Abdominal ultrasound showed cholelithiasis but no acute findings. The plan to do a colonoscopy as an outpatient.     Discharge Exam: Vitals:   09/15/21 0625 09/15/21 1032  BP: 101/68 (!) 174/101  Pulse: 83 85  Resp: 17   Temp: (!) 97.4 F (36.3 C)   SpO2: 100%     General: AAOx3 Cardiovascular: S1S2/RRR Respiratory: CTAB  Discharge Instructions   Discharge Instructions     AMB  referral to pulmonary rehabilitation   Complete by: As directed    Please  select a program: Pulmonary Rehabilitation   Diagnosis: (See process instructions below for COPD requirements): Emphysema   Program Prescription:  O2 Administration by RT, EP, or RN if SpO2<88% Flutter Valve if indicated Spirometry if indicated [facility] only)     After initial evaluation and assessments completed: Virtual Based Care may be provided alone or in conjunction with Pulmonary Rehab/Respiratory Care services based on patient barriers.: Yes   Diet - low sodium heart healthy   Complete by: As directed    Increase activity slowly   Complete by: As directed       Allergies as of 09/15/2021   No Known Allergies      Medication List     STOP taking these medications    lisinopril-hydrochlorothiazide 20-12.5 MG tablet Commonly known as: ZESTORETIC   piperacillin-tazobactam 3.375 GM/50ML IVPB Commonly known as: ZOSYN       TAKE these medications    acetaminophen 325 MG tablet Commonly known as: TYLENOL Take 650 mg by mouth every 6 (six) hours as needed for headache or mild pain.   albuterol 108 (90 Base) MCG/ACT inhaler Commonly known as: VENTOLIN HFA Inhale 1-2 puffs into the lungs every 6 (six) hours as needed.   ascorbic acid 1000 MG tablet Commonly known as: VITAMIN C Take 1,000 mg by mouth daily.   aspirin 81 MG EC tablet Take 81 mg by mouth daily.   carvedilol 3.125 MG tablet Commonly known as: COREG Take 3.125 mg by mouth 2 (two) times daily.   Cholecalciferol 10 MCG (400 UNIT) Caps Take 400 Units by mouth daily.   furosemide 40 MG tablet Commonly known as: LASIX Take 40 mg by mouth daily.   lisinopril 5 MG tablet Commonly known as: ZESTRIL Take 1 tablet (5 mg total) by mouth daily. Start taking on: September 16, 2021   multivitamin capsule Take 1 capsule by mouth daily.   pantoprazole 40 MG tablet Commonly known as: PROTONIX Take 40 mg by mouth daily.   Senexon-S 8.6-50 MG tablet Generic drug: senna-docusate Take 1 tablet by mouth at  bedtime as needed for mild constipation.   simvastatin 20 MG tablet Commonly known as: ZOCOR Take 20 mg by mouth daily. What changed: Another medication with the same name was removed. Continue taking this medication, and follow the directions you see here.   Thera-M Tabs Take 1 tablet by mouth daily.   umeclidinium bromide 62.5 MCG/INH Aepb Commonly known as: INCRUSE ELLIPTA Inhale into the lungs daily. Start taking on: September 16, 2021               Durable Medical Equipment  (From admission, onward)           Start     Ordered   09/15/21 1525  For home use only DME Ventilator  Once       Comments: Settings for Mr. Hiebert's NIV Trilogy:  Trilogy EVO AVAPS-AE VT 300-600 ml EPAP Min 4-8 EPAP Max 10-15 Min PS 6-8 Max PS 16-22 Max Press 37 Rate Auto AVAPS Rate 3-5 Oxygen Bleed-in 0-10 LPM  Question:  Length of Need  Answer:  Lifetime   09/15/21 1524   09/12/21 0923  For home use only DME Bipap  Once       Question Answer Comment  Length of Need Lifetime   Bleed in oxygen (LPM) 2 L of oxygen   Inspiratory pressure 12   Expiratory pressure 8  09/12/21 4944   08/31/21 1555  For home use only DME Ventilator  Once       Comments: Trilogy  Question:  Length of Need  Answer:  Lifetime   08/31/21 1554   08/28/21 1435  For home use only DME oxygen  Once       Question Answer Comment  Length of Need 6 Months   Liters per Minute 2   Frequency Continuous (stationary and portable oxygen unit needed)   Oxygen delivery system Gas      08/28/21 1435   08/25/21 1050  For home use only DME Bipap  Once       Question:  Length of Need  Answer:  Lifetime   08/25/21 1049           No Known Allergies  Follow-up Information     Margaretha Seeds, MD Follow up in 1 month(s).   Specialty: Pulmonary Disease Contact information: [address] [place] [address] [phone]                  The results of significant diagnostics  from this hospitalization (including imaging, microbiology, ancillary and laboratory) are listed below for reference.    Significant Diagnostic Studies: DG Chest 1 View  Result Date: 08/22/2021 CLINICAL DATA:  Status post left thoracentesis. EXAM: CHEST  1 VIEW COMPARISON:  One-view chest x-ray 08/22/2021 at 5:10 a.m. FINDINGS: Left IJ line stable position. Atherosclerotic changes are noted in the aorta. Left hemithorax remains opacified. Right lung is clear. IMPRESSION: 1. Persistent opacification of the left hemithorax. 2. Left IJ line is stable. Electronically Signed   By: San Morelle M.D.   On: 08/22/2021 09:25   DG Chest 1 View  Result Date: 08/20/2021 CLINICAL DATA:  Central line placement. EXAM: CHEST  1 VIEW COMPARISON:  Radiograph earlier today.  CT yesterday. FINDINGS: Left internal jugular central venous catheter tip projects over the mid SVC. Chronic complete opacification of left hemithorax post pneumonectomy. Circumferential pleural calcifications in the left hemithorax again seen. No pneumothorax. Hyperexpanded right lung without focal abnormality. Heart size is obscured but unchanged from prior and normal on CT. IMPRESSION: 1. Left internal jugular central venous catheter tip projects over the mid SVC. No pneumothorax. 2. Left pneumonectomy changes with stable opacification of the left hemithorax. Electronically Signed   By: Keith Rake M.D.   On: 08/20/2021 18:15   CT CHEST W CONTRAST  Addendum Date: 08/19/2021   ADDENDUM REPORT: 08/19/2021 10:38 ADDENDUM: Addendum made to correct presumed voice recognition error in the body of the original report, which reads "pulmonary arterial embolus identified." There is no pulmonary embolism identified on this examination, which is non tailored but with reasonable opacification of the pulmonary arteries. Findings discussed with Dr. Sheppard Coil, 10:35 a.m., 08/19/2021. Electronically Signed   By: Eddie Candle M.D.   On: 08/19/2021 10:38    Result Date: 08/19/2021 CLINICAL DATA:  Shortness of breath. History of cancer and prior left pneumonectomy. EXAM: CT CHEST WITH CONTRAST TECHNIQUE: Multidetector CT imaging of the chest was performed during intravenous contrast administration. CONTRAST:  28m OMNIPAQUE IOHEXOL 350 MG/ML SOLN COMPARISON:  Chest CT dated 09/25/2014. FINDINGS: Cardiovascular: Is no cardiomegaly or pericardial effusion. There is mild atherosclerotic calcification of the thoracic aorta. No aneurysmal dilatation or dissection. The origins of the great vessels of the aortic arch appear patent as visualized. Pulmonary artery embolus identified. Mediastinum/Nodes: No hilar or mediastinal adenopathy. The esophagus is grossly unremarkable. No mediastinal fluid collection.  Lungs/Pleura: Status post prior left pneumonectomy with fluid-filled left hemithorax. Diffuse calcification surrounding the fluid similar to prior CT. The right lung is clear. No pneumothorax. The central airways are patent. Upper Abdomen: No acute abnormality. Musculoskeletal: There is loss of subcutaneous fat and cachexia. No acute osseous pathology. IMPRESSION: 1. No acute intrathoracic pathology. 2. Status post prior left pneumonectomy with fluid-filled left hemithorax. 3. Aortic Atherosclerosis (ICD10-I70.0). Electronically Signed: By: Anner Crete M.D. On: 08/19/2021 02:40   CT ABDOMEN PELVIS W CONTRAST  Result Date: 09/04/2021 CLINICAL DATA:  Acute abdominal pain. History of lung cancer in 2005 with left lung removal. EXAM: CT ABDOMEN AND PELVIS WITH CONTRAST TECHNIQUE: Multidetector CT imaging of the abdomen and pelvis was performed using the standard protocol following bolus administration of intravenous contrast. CONTRAST:  176m OMNIPAQUE IOHEXOL 300 MG/ML  SOLN COMPARISON:  Ultrasound abdomen 08/27/2021. CT chest 08/19/2021. FINDINGS: Lower chest: There are pleural calcifications in the left hemithorax. There is fluid throughout the visualized lower  left hemithorax. Fluid size and shape appear similar to the prior examination, but there is new air within the fluid. The visualized right lung bases clear. Hepatobiliary: There is an 11 mm cyst in the right lobe of the liver similar to prior ultrasound. The liver, gallbladder and bile ducts are otherwise within normal limits. Pancreas: Unremarkable. No pancreatic ductal dilatation or surrounding inflammatory changes. Spleen: Normal in size without focal abnormality. Adrenals/Urinary Tract: The bilateral adrenal glands are within normal limits. There are cortical hypodensities in both kidneys which are too small to characterize. There is no hydronephrosis or perinephric fluid. The bladder is within normal limits. Stomach/Bowel: Stomach is within normal limits. Appendix is not visualized. No evidence of bowel wall thickening, distention, or inflammatory changes. Vascular/Lymphatic: Aortic atherosclerosis. No enlarged abdominal or pelvic lymph nodes. Reproductive: Prostate gland is enlarged. Other: No abdominal wall hernia or abnormality. No abdominopelvic ascites. Musculoskeletal: No acute or significant osseous findings. IMPRESSION: 1. No acute localizing process in the abdomen or pelvis. 2. Fluid-filled left hemithorax is again noted. There is some new air within the fluid which may be related to new infection or may be due to recent instrumentation. Please correlate clinically. 3. Prostatomegaly. Electronically Signed   By: ARonney AstersM.D.   On: 09/04/2021 22:46   CARDIAC CATHETERIZATION  Result Date: 08/24/2021 Findings: RA = 9 RV = 59/11 PA = 61/21 (37) PCW = 11 Fick cardiac output/index = 4.8/2.8 PVR = 5.5 Ao sat = 99% PA sat = 66%, 68% PAPI=  4.4 Assessment: 1. Mild to moderate PAH with no evidence of significant RV strain 2. Normal left-sided pressures Plan/Discussion: Likely WHO Group 3 PAH (mild to moderate). Given degree of lung disease not candidate for selective pulmonary artery dilators. DGlori Bickers MD 2:27 PM  DG Chest Port 1 View  Result Date: 09/06/2021 CLINICAL DATA:  Chest pain. EXAM: PORTABLE CHEST 1 VIEW COMPARISON:  Multiple previous imaging studies. FINDINGS: Stable opacification of the left hemithorax with rim-like calcifications. History of pneumonectomy. The right lung is unremarkable. No pulmonary infiltrates or effusion. Moderate emphysematous changes. IMPRESSION: 1. Stable opacification of the left hemithorax. 2. No acute pulmonary findings. Electronically Signed   By: PMarijo SanesM.D.   On: 09/06/2021 19:57   DG Chest Port 1 View  Result Date: 08/31/2021 CLINICAL DATA:  Shortness of breath EXAM: PORTABLE CHEST 1 VIEW COMPARISON:  08/29/2021 FINDINGS: Unchanged AP portable chest radiograph with total opacification of the left hemithorax and pleural calcification. The right lung is normally  aerated. The heart and mediastinal contours are largely obscured. IMPRESSION: Unchanged AP portable chest radiograph with total opacification of the left hemithorax and pleural calcification. The right lung is normally aerated. Electronically Signed   By: Eddie Candle M.D.   On: 08/31/2021 12:39   DG CHEST PORT 1 VIEW  Result Date: 08/29/2021 CLINICAL DATA:  Shortness of breath EXAM: PORTABLE CHEST 1 VIEW COMPARISON:  08/28/2021 FINDINGS: There again noted changes consistent with prior pneumonectomy on the left. The left hemithorax demonstrates diffuse pleural calcifications and opacification stable from the prior study. Right lung is hyperinflated but clear. No bony abnormality is seen. IMPRESSION: Chronic changes consistent with prior left pneumonectomy. No acute abnormality seen. Electronically Signed   By: Inez Catalina M.D.   On: 08/29/2021 09:05   DG CHEST PORT 1 VIEW  Result Date: 08/28/2021 CLINICAL DATA:  Shortness of breath. EXAM: PORTABLE CHEST 1 VIEW COMPARISON:  08/26/2021 FINDINGS: Mild tracheal deviation left. Normal heart size. White out of the left hemithorax,  secondary to prior pneumonectomy with fluid in the operative bed. Peripheral calcifications throughout the left hemithorax again identified. The right costophrenic angle is partially excluded. No pleural fluid or pneumothorax. Mild hyperinflation, without right-sided pulmonary opacity. IMPRESSION: Left-sided pneumonectomy, as before. No acute superimposed process. Electronically Signed   By: Abigail Miyamoto M.D.   On: 08/28/2021 10:39   DG CHEST PORT 1 VIEW  Result Date: 08/26/2021 CLINICAL DATA:  Shortness of breath.  History of left pneumonectomy. EXAM: PORTABLE CHEST 1 VIEW COMPARISON:  08/22/2021 FINDINGS: Complete opacification of the left hemithorax with pleural based calcifications. These findings are unchanged. Right lung is clear. Heart and mediastinum are obscured by the opacification in the left hemithorax. IMPRESSION: 1. No acute disease. 2. Stable changes from left pneumonectomy. Electronically Signed   By: Markus Daft M.D.   On: 08/26/2021 08:52   DG Chest Port 1 View  Result Date: 08/22/2021 CLINICAL DATA:  Left pneumonectomy, dyspnea EXAM: PORTABLE CHEST 1 VIEW COMPARISON:  08/20/2021 chest radiograph. FINDINGS: Left internal jugular central venous catheter terminates in the lower third of the SVC. Stable postsurgical changes from left pneumonectomy with volume loss in the left hemithorax and complete opacification of the left hemithorax. Stable visualized cardiomediastinal silhouette. No pneumothorax. Stable mild blunting of the right costophrenic angle. No right-sided pulmonary edema. No acute consolidative airspace disease. IMPRESSION: Stable postsurgical changes from left pneumonectomy. No acute cardiopulmonary disease. Stable mild blunting of the right costophrenic angle, cannot exclude small right pleural effusion. Electronically Signed   By: Ilona Sorrel M.D.   On: 08/22/2021 07:24   DG Chest Port 1 View  Result Date: 08/20/2021 CLINICAL DATA:  Pleural effusion EXAM: PORTABLE CHEST 1 VIEW  COMPARISON:  08/19/2021 FINDINGS: Prior left pneumonectomy with complete opacification of the left hemithorax. Right lung is clear. Heart likely normal size. Aortic atherosclerosis. IMPRESSION: Prior left pneumonectomy. No focal opacity on the right. Electronically Signed   By: Rolm Baptise M.D.   On: 08/20/2021 01:58   DG Chest Port 1 View  Result Date: 08/19/2021 CLINICAL DATA:  Post LEFT thoracentesis in a 70 year old male. EXAM: PORTABLE CHEST 1 VIEW COMPARISON:  August 18, 2021. FINDINGS: Image rotated to the LEFT. Shift of mediastinal structures slightly into the LEFT chest may be slightly increased since the previous study and diminished size of fluid based on comparison with the contour of calcified "rind" in the LEFT chest. This measures approximately 7 cm greatest transverse dimension on the current study and approximally 10.5 cm  on the previous exam when measured in a similar fashion. The LEFT hemidiaphragm appears higher in the LEFT chest as well implying diminished volume of fluid in the LEFT thoracic cavity. Signs of LEFT pneumonectomy. No signs of air in the pleural space. RIGHT lung is clear. LEFT heart border is obscured. No acute skeletal process on limited assessment. IMPRESSION: Decreased volume of pleural fluid on the LEFT post thoracentesis. No definite air in the pleural space in the LEFT chest in this patient post LEFT pneumonectomy. RIGHT lung is clear. Electronically Signed   By: Zetta Bills M.D.   On: 08/19/2021 13:41   DG Chest Port 1 View  Result Date: 08/18/2021 CLINICAL DATA:  Shortness of breath. Abdominal bloating. Orthopnea. EXAM: PORTABLE CHEST 1 VIEW COMPARISON:  Chest x-ray 09/25/2014, CT angio chest 09/25/2014 FINDINGS: The heart and mediastinal contours are within normal limits. Surgical changes related to left pneumonectomy with complete opacification of the left hemithorax. No focal consolidation. No pulmonary edema. No pleural effusion. No pneumothorax. No acute  osseous abnormality. IMPRESSION: Acute cardiopulmonary abnormality in a patient status post left pneumonectomy. Electronically Signed   By: Iven Finn M.D.   On: 08/18/2021 19:47   DG Abd Portable 1V  Result Date: 09/01/2021 CLINICAL DATA:  Abdominal pain and constipation EXAM: PORTABLE ABDOMEN - 1 VIEW COMPARISON:  None. FINDINGS: No sign of bowel obstruction. There is a moderate amount of fecal matter and gas within the colon. The small bowel pattern is normal. No abnormal calcifications or bone findings. IMPRESSION: Moderate amount of fecal matter and gas within the colon which could go along with the clinical history of constipation. Electronically Signed   By: Nelson Chimes M.D.   On: 09/01/2021 18:56   ECHOCARDIOGRAM COMPLETE  Result Date: 08/20/2021    ECHOCARDIOGRAM REPORT   Patient Name:   Loman Swatzell Date of Exam: 08/20/2021 Medical Rec #:  [id number]    Height:       69.0 in Accession #:    [id number]   Weight:       123.0 lb Date of Birth:  03-06-1951   BSA:          1.680 m Patient Age:    70 years     BP:           91/55 mmHg Patient Gender: M            HR:           82 bpm. Exam Location:  Inpatient Procedure: 2D Echo, Cardiac Doppler and Color Doppler Indications:    Pulmonary hypertension  History:        Patient has prior history of Echocardiogram examinations, most                 recent 04/06/2013. Signs/Symptoms:Shortness of Breath; Risk                 Factors:Hypertension and Dyslipidemia. DOE.  Sonographer:    Clayton Lefort RDCS (AE) Referring Phys: [id number] Estill Cotta  Sonographer Comments: On BIPAP. IMPRESSIONS  1. Left ventricular ejection fraction, by estimation, is 70 to 75%. The left ventricle has hyperdynamic function. The left ventricle has no regional wall motion abnormalities. Left ventricular diastolic parameters were normal.  2. Right ventricular systolic function is normal. The right ventricular size is mildly enlarged. There is severely elevated pulmonary artery  systolic pressure.  3. Right atrial size was mildly dilated.  4. There is heterogeneous echodense material in the left pleural space. The  pericardial effusion is circumferential.  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 No evidence of mitral valve regurgitation.  6. 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  7. Cannot exclude a bicuspid aortic valve. The aortic valve has an indeterminant number of cusps. There is severe calcification of the aortic valve, particularly the anterior (right) coronary cusp. There is severe thickening of the aortic valve. Aortic valve regurgitation is mild.  8. The inferior vena cava is dilated in size with <50% respiratory variability, suggesting right atrial pressure of 15 mmHg. (this is an uncertain estimation due to noninvasive positive pressure ventilation). Comparison(s): A prior study was performed on 04/06/2013. Prior images unable to be directly viewed, comparison made by report only. PA pressure is now much higher (was 30 mm Hg). FINDINGS  Left Ventricle: Left ventricular ejection fraction, by estimation, is 70 to 75%. The left ventricle has hyperdynamic function. The left ventricle has no regional wall motion abnormalities. The left ventricular internal cavity size was small. There is no  left ventricular hypertrophy. Left ventricular diastolic parameters were normal. Right Ventricle: The right ventricular size is mildly enlarged. No increase in right ventricular wall thickness. Right ventricular systolic function is normal. There is severely elevated pulmonary artery systolic pressure. The tricuspid regurgitant velocity is 3.71 m/s, and with an assumed right atrial pressure of 8 mmHg, the estimated right ventricular systolic pressure is 49.7 mmHg. Left Atrium: Left atrial size was normal in size. Right Atrium: Right atrial size was mildly dilated. Pericardium: There is heterogeneous echodense material in the left pleural space. Trivial  pericardial effusion is present. The pericardial effusion is circumferential. Mitral Valve: The mitral valve is myxomatous. Mild to moderate mitral annular calcification. No evidence of mitral valve regurgitation. MV peak gradient, 5.5 mmHg. The mean mitral valve gradient is 3.0 mmHg. 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 Aortic Valve: Cannot exclude a bicuspid aortic valve. The aortic valve has an indeterminant number of cusps. There is severe calcifcation of the aortic valve. There is severe thickening of the aortic valve. Aortic valve regurgitation is mild. Aortic valve mean gradient measures 8.0 mmHg. Aortic valve peak gradient measures 14.3 mmHg. Aortic valve area, by VTI measures 2.44 cm. Pulmonic Valve: The pulmonic valve was normal in structure. Pulmonic valve regurgitation is not visualized. Aorta: The aortic root is normal in size and structure. Venous: The inferior vena cava is dilated in size with less than 50% respiratory variability, suggesting right atrial pressure of 15 mmHg. IAS/Shunts: No atrial level shunt detected by color flow Doppler.  LEFT VENTRICLE PLAX 2D LVIDd:         3.20 cm  Diastology LVIDs:         1.80 cm  LV e' medial:    7.62 cm/s LV PW:         1.10 cm  LV E/e' medial:  13.6 LV IVS:        0.70 cm  LV e' lateral:   9.36 cm/s LVOT diam:     2.40 cm  LV E/e' lateral: 11.1 LV SV:         81 LV SV Index:   48 LVOT Area:     4.52 cm  RIGHT VENTRICLE             IVC RV Basal diam:  2.90 cm     IVC diam: 2.40 cm RV Mid diam:    2.50 cm RV S prime:  15.80 cm/s TAPSE (M-mode): 2.2 cm LEFT ATRIUM             Index       RIGHT ATRIUM           Index LA diam:        2.70 cm 1.61 cm/m  RA Area:     20.00 cm LA Vol (A2C):   31.0 ml 18.45 ml/m RA Volume:   61.40 ml  36.54 ml/m LA Vol (A4C):   21.0 ml 12.50 ml/m LA Biplane Vol: 26.2 ml 15.59 ml/m  AORTIC VALVE AV Area (Vmax):    2.51 cm AV Area (Vmean):   2.59 cm AV  Area (VTI):     2.44 cm AV Vmax:           189.00 cm/s AV Vmean:          127.000 cm/s AV VTI:            0.330 m AV Peak Grad:      14.3 mmHg AV Mean Grad:      8.0 mmHg LVOT Vmax:         105.00 cm/s LVOT Vmean:        72.700 cm/s LVOT VTI:          0.178 m LVOT/AV VTI ratio: 0.54  AORTA Ao Root diam: 3.30 cm Ao Asc diam:  3.10 cm MITRAL VALVE                TRICUSPID VALVE MV Area (PHT): 3.39 cm     TR Peak grad:   55.1 mmHg MV Area VTI:   2.45 cm     TR Vmax:        371.00 cm/s MV Peak grad:  5.5 mmHg MV Mean grad:  3.0 mmHg     SHUNTS MV Vmax:       1.17 m/s     Systemic VTI:  0.18 m MV Vmean:      75.3 cm/s    Systemic Diam: 2.40 cm MV Decel Time: 224 msec MV E velocity: 104.00 cm/s MV A velocity: 103.00 cm/s MV E/A ratio:  1.01 Mihai Croitoru MD Electronically signed by Sanda Klein MD Signature Date/Time: 08/20/2021/1:04:53 PM    Final    VAS [place] LOWER EXTREMITY VENOUS (DVT)  Result Date: 08/23/2021  Lower Venous DVT Study Patient Name:  JAKOBEE Harter  Date of Exam:   08/23/2021 Medical Rec #: [id number]     Accession #:    [id number] Date of Birth: [birth year]    Patient Gender: M Patient Age:   20 years Exam Location:  [facility] Procedure:      VAS [place] LOWER EXTREMITY VENOUS (DVT) Referring Phys: Ina Homes --------------------------------------------------------------------------------  Indications: Edema. Other Indications: Worsening dyspnea and LE edema over past several months. Comparison Study: No previous exams Performing Technologist: Jody Hill RVT, RDMS  Examination Guidelines: A complete evaluation includes B-mode imaging, spectral Doppler, color Doppler, and power Doppler as needed of all accessible portions of each vessel. Bilateral testing is considered an integral part of a complete examination. Limited examinations for reoccurring indications may be performed as noted. The reflux portion of the exam is performed with the patient in reverse Trendelenburg.   +---------+---------------+---------+-----------+----------+--------------+ RIGHT    CompressibilityPhasicitySpontaneityPropertiesThrombus Aging +---------+---------------+---------+-----------+----------+--------------+ CFV      Full           Yes      Yes                                 +---------+---------------+---------+-----------+----------+--------------+  SFJ      Full                                                        +---------+---------------+---------+-----------+----------+--------------+ FV Prox  Full           Yes      Yes                                 +---------+---------------+---------+-----------+----------+--------------+ FV Mid   Full           Yes      Yes                                 +---------+---------------+---------+-----------+----------+--------------+ FV DistalFull           Yes      Yes                                 +---------+---------------+---------+-----------+----------+--------------+ PFV      Full                                                        +---------+---------------+---------+-----------+----------+--------------+ POP      Full           Yes      Yes                                 +---------+---------------+---------+-----------+----------+--------------+ PTV      Full                                                        +---------+---------------+---------+-----------+----------+--------------+ PERO     Full                                                        +---------+---------------+---------+-----------+----------+--------------+   +---------+---------------+---------+-----------+----------+--------------+ LEFT     CompressibilityPhasicitySpontaneityPropertiesThrombus Aging +---------+---------------+---------+-----------+----------+--------------+ CFV      Full           Yes      Yes                                  +---------+---------------+---------+-----------+----------+--------------+ SFJ      Full                                                        +---------+---------------+---------+-----------+----------+--------------+  FV Prox  Full           Yes      Yes                                 +---------+---------------+---------+-----------+----------+--------------+ FV Mid   Full           Yes      Yes                                 +---------+---------------+---------+-----------+----------+--------------+ FV DistalFull           Yes      Yes                                 +---------+---------------+---------+-----------+----------+--------------+ PFV      Full                                                        +---------+---------------+---------+-----------+----------+--------------+ POP      Full           Yes      Yes                                 +---------+---------------+---------+-----------+----------+--------------+ PTV      Full                                                        +---------+---------------+---------+-----------+----------+--------------+ PERO     Full                                                        +---------+---------------+---------+-----------+----------+--------------+     Summary: BILATERAL: - No evidence of deep vein thrombosis seen in the lower extremities, bilaterally. - No evidence of superficial venous thrombosis in the lower extremities, bilaterally. -No evidence of popliteal cyst, bilaterally.   *See table(s) above for measurements and observations. Electronically signed by Harold Barban MD on 08/23/2021 at 7:21:54 PM.    Final    [place] EKG SITE RITE  Result Date: 08/20/2021 If Site Rite image not attached, placement could not be confirmed due to current cardiac rhythm.  US Abdomen Limited RUQ (LIVER/GB)  Result Date: 08/27/2021 CLINICAL DATA:  70 year old male with a history epigastric pain EXAM:  ULTRASOUND ABDOMEN LIMITED RIGHT UPPER QUADRANT COMPARISON:  None. FINDINGS: Gallbladder: No gallstones or wall thickening visualized. No sonographic Murphy sign noted by sonographer. Common bile duct: Diameter: 2 mm-3 mm Liver: Anechoic rounded lesion within the anterior right liver, 1 cm x 1 cm x 1.2 cm, with through transmission and no internal complexity. Within normal limits in parenchymal echogenicity. Portal vein is patent on color Doppler imaging with normal direction of blood flow towards the liver. Other: None. IMPRESSION: Negative four cholelithiasis.  Simple cyst of the right liver. Electronically Signed   By: Corrie Mckusick D.O.   On: 08/27/2021 10:51   DG ESOPHAGUS W SINGLE CM (SOL OR THIN BA)  Result Date: 08/25/2021 CLINICAL DATA:  Belching. Bloating. Dysphagia. Prior left pneumonectomy. EXAM: ESOPHOGRAM/BARIUM SWALLOW TECHNIQUE: Single contrast examination was performed using  thin barium. FLUOROSCOPY TIME:  Fluoroscopy Time:  2 minutes, 42 seconds Radiation Exposure Index (if provided by the fluoroscopic device): 31.2 mGy Number of Acquired Spot Images: 0 COMPARISON:  CT chest 08/19/2021 FINDINGS: Aortic knob impression on the esophagus accounts for the curvature on image 44 series 2. Lack of aeration of the left hemithorax compatible with known left pneumonectomy. No esophageal stricture observed. Primary peristaltic waves in the esophagus were intact. However, episodes of gastroesophageal reflux were identified. A 13 mm barium tablet transiently impacted at the gastroesophageal junction but subsequently reflux back into the esophagus. This settled back near the gastroesophageal junction but fail to proceed into the stomach over a total five-minute observation time despite the lack of obvious regional stricture. IMPRESSION: 1. Although no specific stricture or narrowing is identified at the gastroesophageal junction, there is impaction of the barium pill in this vicinity with failure to proceed  into the stomach. Significance uncertain, sometimes this is simply incidental but this does raise the possibility of a small occult narrowing at the GE junction. 2. Gastroesophageal reflux was observed. 3. Left pneumonectomy. Electronically Signed   By: Van Clines M.D.   On: 08/25/2021 09:28   US THORACENTESIS ASP PLEURAL SPACE W/IMG GUIDE  Result Date: 08/19/2021 INDICATION: Symptomatic LEFT sided pleural effusion EXAM: US THORACENTESIS ASP PLEURAL SPACE W/IMG GUIDE COMPARISON:  Chest radiograph, 08/18/2021.  CT chest, 08/20/2019. MEDICATIONS: None. COMPLICATIONS: None immediate. TECHNIQUE: Informed written consent was obtained from the patient after a discussion of the risks, benefits and alternatives to treatment. A timeout was performed prior to the initiation of the procedure. Initial ultrasound scanning demonstrates a large volume LEFT pleural effusion. The lower chest was prepped and draped in the usual sterile fashion. 1% lidocaine was used for local anesthesia. Under direct ultrasound guidance, a 19 gauge, 7-cm, Yueh catheter was introduced. An ultrasound image was saved for documentation purposes. The thoracentesis was performed. The catheter was removed and a dressing was applied. The patient tolerated the procedure well without immediate post procedural complication. A postprocedural upright chest radiograph was requested. FINDINGS: A total of approximately 0.9 liters of thick serosanguineous fluid was removed. Requested samples were sent to the laboratory. IMPRESSION: 1. Successful ultrasound-guided LEFT sided thoracentesis yielding 4 liters of pleural fluid. 2. Thick serosanguineous consistency of pleural fluid obtained. The samples were submitted for analysis. Michaelle Birks, MD [facility] Radiology Electronically Signed   By: Michaelle Birks M.D.   On: 08/19/2021 17:39    Microbiology: No results found for this or any previous visit (from the  past 240 hour(s)).   Labs: Basic Metabolic Panel: Recent Labs  Lab 09/15/21 0246  NA 137  K 3.8  CL 96*  CO2 36*  GLUCOSE 87  BUN 16  CREATININE 0.95  CALCIUM 9.0   Liver Function Tests: No results for input(s): AST, ALT, ALKPHOS, BILITOT, PROT, ALBUMIN in the last 168 hours. No results for input(s): LIPASE, AMYLASE in the last 168 hours. No results for input(s): AMMONIA in the last 168 hours. CBC: Recent Labs  Lab 09/15/21 0246  WBC 4.3  HGB 9.5*  HCT 31.0*  MCV 91.4  PLT 218  Cardiac Enzymes: No results for input(s): CKTOTAL, CKMB, CKMBINDEX, TROPONINI in the last 168 hours. BNP: BNP (last 3 results) Recent Labs    08/18/21 1941 08/20/21 0414  BNP 189.9* 116.3*    ProBNP (last 3 results) No results for input(s): PROBNP in the last 8760 hours.  CBG: No results for input(s): GLUCAP in the last 168 hours.     Signed:  Domenic Polite MD.  [facility] 09/15/2021, 3:27 PM

## 2021-09-15 NOTE — Care Management (Addendum)
09-15-21 1218 Patient is agreeable to having [employer] deliver the BIPAP. Case Manager received confirmation from [employer] that the patient will receive the BIPAP and set up in the hospital room between 1800-1900 this evening. Case Manager did make staff RN and physician aware of delivery and set up time. No further needs identified by this Case Manager.     09-15-21 1607 Trilogy order written by the provider. Copy of the order to be given to the patient to take to his [place] provider. No further needs from Case Manager at this time.

## 2021-09-16 ENCOUNTER — Other Ambulatory Visit (HOSPITAL_COMMUNITY): Payer: Self-pay

## 2021-09-16 DIAGNOSIS — E43 Unspecified severe protein-calorie malnutrition: Secondary | ICD-10-CM

## 2021-09-16 MED ORDER — CARVEDILOL 3.125 MG PO TABS
3.1250 mg | ORAL_TABLET | Freq: Two times a day (BID) | ORAL | 0 refills | Status: DC
  Filled 2021-09-16: qty 60, 30d supply, fill #0

## 2021-09-16 MED ORDER — FUROSEMIDE 40 MG PO TABS
40.0000 mg | ORAL_TABLET | Freq: Every day | ORAL | 0 refills | Status: DC
  Filled 2021-09-16 (×2): qty 30, 30d supply, fill #0

## 2021-09-16 NOTE — Progress Notes (Signed)
Discharge instructions (including medications) discussed with and copy provided to patient/caregiver

## 2021-09-16 NOTE — TOC CM/SW Note (Addendum)
SATURATION QUALIFICATIONS: (This note is used to comply with regulatory documentation for home oxygen)  Patient Saturations on Room Air at Rest = 86%  Patient Saturations on Room Air while Ambulating =  Patient Saturations on 1 Liters of oxygen while Ambulating = 98%  Please briefly explain why patient needs home oxygen: CHF, Advanced COPD  Jonnie Finner RN3 CCM, Heart Failure TOC CM [phone]   09/16/2021 107 pm Pt requested Portable oxygen concentrator. [place]. Contacted attending for order update. Rep, Thedore Mins will educate patient on how to flow oxygen into Bipap. [place], Heart Failure TOC CM [phone]

## 2021-09-16 NOTE — TOC CM/SW Note (Signed)
HF TOC CM spoke to pt at bedside and states he has follow up appt at [facility]. Gave permission to contact to follow up on assisting with Trilogy. [place] and spoke to Wiley [place], [place] ext [phone], fax [phone]. States pt has appt on 09/27/2021 in [facility]. States he does not have PCP listed yet. States she will forward progress notes and Trilogy order to correct PCP once assigned. Faxed to [facility] per pt's request. Provided pt with CM contact number. [place], Heart Failure TOC CM [phone]

## 2021-09-16 NOTE — TOC CM/SW Note (Signed)
HF TOC CM spoke to Unit RN will need qualifying sats for oxygen. Spoke to pt for scheduled dc home today. States he has an appt with new PCP at [facility] next week and will discuss with PCP assisting with getting Trilogy/NIV. Will contact [place] for oxygen after qualifying sats received. [place], Heart Failure TOC CM [phone]

## 2021-09-16 NOTE — Plan of Care (Signed)
  Problem: SLP Dysphagia Goals Goal: Patient will utilize recommended strategies Description: Patient will utilize recommended strategies during swallow to increase swallowing safety with Outcome: Adequate for Discharge

## 2021-09-16 NOTE — Progress Notes (Signed)
Patient received discharge papers, oxygen  and concentrator at bedside. Patient demonstrated proper use. No further questions from patient. He has his meds at bedside delivered from [facility].

## 2021-09-16 NOTE — Progress Notes (Signed)
Patient had a 4 second run of SVT while ambulating in the room. Asymptomatic. I sent Dr. Broadus John a secure chat

## 2021-09-16 NOTE — Progress Notes (Signed)
Pt placed on bipap for the night. °

## 2021-09-22 LAB — FUNGUS CULTURE WITH STAIN

## 2021-09-22 LAB — FUNGUS CULTURE RESULT

## 2021-09-22 LAB — FUNGAL ORGANISM REFLEX

## 2021-09-26 ENCOUNTER — Telehealth: Payer: Self-pay | Admitting: Pulmonary Disease

## 2021-09-26 NOTE — Telephone Encounter (Signed)
I have called Melissa and she stated that the pt stated that the BIPAP is not working for him, he doe not like the BIPAP.  She is aware that pt is scheduled for PFT and OV on 10/28 with JE.  Lenna Sciara is going to check if the PFT will be enough or if the pt will need ABG on BIPAP.  She will call back.    Melissa wanted JE to be aware of this for his upcoming appt as if this is documented it can be submitted to [facility] and pt could possibly get approved for the trilogy.  He used this in the hospital.  Will hold for Melissa to call back.

## 2021-09-30 ENCOUNTER — Other Ambulatory Visit: Payer: Self-pay

## 2021-09-30 ENCOUNTER — Ambulatory Visit
Admission: RE | Admit: 2021-09-30 | Discharge: 2021-09-30 | Disposition: A | Discharge status: Home / Self Care | Payer: Medicare Other | Source: Ambulatory Visit | Attending: Emergency Medicine | Admitting: Emergency Medicine

## 2021-09-30 VITALS — BP 121/76 | HR 90 | Temp 98.2°F | Resp 16

## 2021-09-30 DIAGNOSIS — N390 Urinary tract infection, site not specified: Secondary | ICD-10-CM | POA: Diagnosis present

## 2021-09-30 LAB — POCT URINALYSIS DIP (MANUAL ENTRY)
Glucose, UA: NEGATIVE mg/dL
Nitrite, UA: NEGATIVE
Protein Ur, POC: 100 mg/dL — AB
Spec Grav, UA: 1.015 (ref 1.010–1.025)
Urobilinogen, UA: 1 E.U./dL
pH, UA: 6 (ref 5.0–8.0)

## 2021-09-30 MED ORDER — SULFAMETHOXAZOLE-TRIMETHOPRIM 800-160 MG PO TABS: 1.0000 | ORAL_TABLET | Freq: Two times a day (BID) | ORAL | 0 refills | Status: AC

## 2021-09-30 NOTE — Telephone Encounter (Signed)
Call made to [facility] made aware patient had a ABG on 08/19/21. She is going to review and get back with [place].   Will leave note open per [facility] request.

## 2021-09-30 NOTE — Telephone Encounter (Addendum)
Noted. Please see plan for NIV in office note by PCCM on 10/14/21.

## 2021-09-30 NOTE — Telephone Encounter (Signed)
[place] [facility] Note  Please send ABG from 08/23/21 in the EMR and/or this note AND note from 08/26/21 at 9:21 AM by Montey Hora, PA with attestation by me.  Patient has chronic hypercarbic respiratory failure documented during his hospitalization from 08/20/21 to 09/16/21   Results for [name], [name] (MRN [id number]) as of 09/30/2021 12:31  Ref. Range 08/22/2021 14:15 08/22/2021 21:22 08/23/2021 09:20 08/24/2021 12:34 08/24/2021 12:34  Sample type Unknown ARTERIAL ARTERIAL ARTERIAL VENOUS VENOUS  pH, Arterial Latest Ref Range: 7.350 - 7.450  7.392 7.365 7.378    pCO2 arterial Latest Ref Range: 32.0 - 48.0 mmHg 74.5 (HH) 78.1 (HH) 76.1 (HH)    pO2, Arterial Latest Ref Range: 83.0 - 108.0 mmHg 79 (L) 82 (L) 156 (H)    pH, Ven Latest Ref Range: 7.250 - 7.430     7.357 7.351  pCO2, Ven Latest Ref Range: 44.0 - 60.0 mmHg    72.9 (HH) 76.8 (HH)  pO2, Ven Latest Ref Range: 32.0 - 45.0 mmHg    39.0 38.0  TCO2 Latest Ref Range: 22 - 32 mmol/L 47 (H) 47 (H) 47 (H) 43 (H) 45 (H)  Acid-Base Excess Latest Ref Range: 0.0 - 2.0 mmol/L 17.0 (H) 16.0 (H) 16.0 (H) 12.0 (H) 13.0 (H)  Bicarbonate Latest Ref Range: 20.0 - 28.0 mmol/L 45.3 (H) 44.3 (H) 45.0 (H) 40.9 (H) 42.4 (H)  O2 Saturation Latest Units: % 95.0 94.0 99.0 68.0 66.0  Patient temperature Unknown 98.7 F 99.9 F 97.9 F    Collection site Unknown   art line

## 2021-09-30 NOTE — Discharge Instructions (Signed)
You were seen today for an infection in the lower urinary tract. Your urine sample today was sent for a culture. The urine culture will show what type of bacteria grows and if you are on the appropriate antibiotic. If the antibiotic needs to be changed, you will receive a phone call from the follow up nurse who will give you more information. If you do not receive a call, then you are on the correct antibiotic.  Take antibiotics as directed. Finish course even if feeling better sooner. Drink plenty of clear fluids. Over the counter "Uristat" or "Azo Standard" may help your discomfort.  This will turn your urine dark orange or red and may stain contacts (remove before using). You may experience 24 - 48 hours of continuing discomfort until medication controls the infection.  Return to clinic or go to the ER if you develop a fever, one-sided back pain, or vomiting as these are signs of a worsening infection.

## 2021-09-30 NOTE — ED Triage Notes (Signed)
Patient presents to Urgent Care with complaints of hematuria since today.  Dysuria and foul odor urine x 3-4 days ago. Has a hx of UTIs.   Denies fever or abdominal pain.

## 2021-09-30 NOTE — Telephone Encounter (Signed)
JE please advise. Thanks  Melissa calling back from [facility] will require an ABG to qualify for a Trilogy vent. Please advise. [phone]   Incoming call

## 2021-09-30 NOTE — Telephone Encounter (Signed)
Call returned to [facility] with [employer]. Patient was started on bipap after that ABG was drawn. His insurance denied the trilogy stating he has to try and fail bi-pap first. Per Melissa with need to repeat his ABG and will need a note from MD stating the patient is compliant however it is still not effective. The more details the better. The ABG does not need to be on bipap.  This is for insurance to cover the trilogy.   JE please advise if you would like [place] to go ahead and place order for repeat ABG. Thanks :)

## 2021-09-30 NOTE — ED Provider Notes (Signed)
Subjective:    [name] [name] is a very pleasant 70 y.o. male who presents with concerns for UTI due to hematuria along with discomfort with urination and foul-smelling urine which started about 3 days ago.  Patient does report that his hematuria started today.  Patient reports a history of urinary tract infections. No unilateral back pain, vomiting, fever.  Past medical history, past surgical history, current medications reviewed.  Allergies: has No Known Allergies.  Review of Systems See HPI   Objective:     Vitals:   09/30/21 1458  BP: 121/76  Pulse: 90  Resp: 16  Temp: 98.2 F (36.8 C)  SpO2: 98%     General: Appears well-developed and well-nourished. No acute distress.  Cardiovascular: Normal rate Pulm/Chest: No respiratory distress Abdominal: No CVAT.  Neurological: Alert and oriented to [place], place, and time.  Skin: Skin is warm and dry.  Psychiatric: Normal mood, affect, behavior, and thought content.  GU:  Deferred secondary to self collect specimen.  Laboratory:  Orders Placed This Encounter  Procedures   Urine Culture   POCT urinalysis dipstick   Results for orders placed or performed during the hospital encounter of 09/30/21  POCT urinalysis dipstick  Result Value Ref Range   Color, UA brown (A) yellow   Clarity, UA cloudy (A) clear   Glucose, UA negative negative mg/dL   Bilirubin, UA small (A) negative   Ketones, POC UA trace (5) (A) negative mg/dL   Spec Grav, UA 1.015 1.010 - 1.025   Blood, UA large (A) negative   pH, UA 6.0 5.0 - 8.0   Protein Ur, POC =100 (A) negative mg/dL   Urobilinogen, UA 1.0 0.2 or 1.0 E.U./dL   Nitrite, UA Negative Negative   Leukocytes, UA Large (3+) (A) Negative     Assessment:   1. Acute UTI - Urine Culture; Standing - Urine Culture - sulfamethoxazole-trimethoprim (BACTRIM DS) 800-160 MG tablet; Take 1 tablet by mouth 2 (two) times daily for 7 days.  Dispense: 14 tablet; Refill: 0  Plan:   MDM: Patient  presents with hematuria along with discomfort with urination and foul-smelling urine which started about 3 days ago.  Patient does report that his hematuria started today.  Patient reports a history of urinary tract infections. No unilateral back pain, vomiting, fever.  Urinalysis reveals brown cloudy urine with small bilirubin, trace ketones, large blood, large leukocytes of 100 protein.  Urine culture pending.  Given symptoms along with urinalysis, likely acute UTI.  Rx'd Bactrim to the patient's preferred pharmacy and advised about home treatment and care to include pushing fluids and use of Azo or Uristat over-the-counter.  Return to clinic or go to the ER with any fever, one-sided back pain or vomiting.  Patient verbalized understanding and agreed with plan.  Patient stable upon discharge.  Return as needed.    Discharge Instructions      You were seen today for an infection in the lower urinary tract. Your urine sample today was sent for a culture. The urine culture will show what type of bacteria grows and if you are on the appropriate antibiotic. If the antibiotic needs to be changed, you will receive a phone call from the follow up nurse who will give you more information. If you do not receive a call, then you are on the correct antibiotic.  Take antibiotics as directed. Finish course even if feeling better sooner. Drink plenty of clear fluids. Over the counter "Uristat" or "Azo Standard" may help your  discomfort.  This will turn your urine dark orange or red and may stain contacts (remove before using). You may experience 24 - 48 hours of continuing discomfort until medication controls the infection.  Return to clinic or go to the ER if you develop a fever, one-sided back pain, or vomiting as these are signs of a worsening infection.          Serafina Royals, [place] 09/30/21 1530

## 2021-10-02 LAB — URINE CULTURE: Culture: <10000 — AB

## 2021-10-13 ENCOUNTER — Other Ambulatory Visit: Payer: Self-pay | Admitting: Pulmonary Disease

## 2021-10-13 DIAGNOSIS — J869 Pyothorax without fistula: Secondary | ICD-10-CM

## 2021-10-14 ENCOUNTER — Encounter: Payer: Self-pay | Admitting: Pulmonary Disease

## 2021-10-14 ENCOUNTER — Other Ambulatory Visit: Payer: Self-pay

## 2021-10-14 ENCOUNTER — Ambulatory Visit (INDEPENDENT_AMBULATORY_CARE_PROVIDER_SITE_OTHER): Payer: Medicare Other | Admitting: Pulmonary Disease

## 2021-10-14 VITALS — BP 130/80 | HR 66 | Temp 97.9°F | Ht 69.0 in | Wt 125.0 lb

## 2021-10-14 DIAGNOSIS — J432 Centrilobular emphysema: Secondary | ICD-10-CM

## 2021-10-14 NOTE — Progress Notes (Signed)
Subjective:   PATIENT ID: [name] [name] GENDER: male DOB: 07-Jul-1951, MRN: [id number]   HPI  Chief Complaint  Patient presents with   Follow-up    Sob f/u   Reason for Visit: Follow-up  [name] is a 70 year old male prior tobacco abuse (reported 10 pack years) with emphysema, pulmonary hypertension, history of squamous cell lung cancer status postpneumonectomy in 2005 and chemotherapy, hyperlipidemia, hypertension and type 2 diabetes who presents for hospital follow-up. Daughter is present and provides additional history  He was recently hospitalized for acute hypoxemic and hypercarbic respiratory failure secondary to right heart failure.  He initially presented to [facility] and was transferred to [facility] for left pleural effusion/empyema.  He underwent thoracentesis on 9/2 with removal of 900 cc of thick pleural fluid.  Post procedure he briefly required Levophed for hypotension.  Repeat Thora on 9/6 with 500 cc removed.  Right heart cath with mild-moderate PH.  Pleural fluid  chronic due to his pneumonectomy. No evidence of empyema on 9/2 pleural studies. For his chronic hypercarbic failure secondary he was recommended for NIV. He reports at home his BiPAP does not seem to be working and will be off in the middle of the night. It does not seem to be as effective as the device in the hospital.  Social History: Former smoker  I have personally reviewed patient's past medical/family/social history, allergies, current medications.  Past Medical History:  Diagnosis Date   Cancer ([place])    lung   CHF (congestive heart failure) (HCC)    Diabetes mellitus without complication ([place])    Dyspnea    Hyperlipidemia    Hypertension     No Known Allergies   Outpatient Medications Prior to Visit  Medication Sig Dispense Refill   acetaminophen (TYLENOL) 325 MG tablet Take 650 mg by mouth every 6 (six) hours as needed for headache or mild pain.     albuterol (VENTOLIN HFA) 108  (90 Base) MCG/ACT inhaler Inhale 1-2 puffs into the lungs every 6 (six) hours as needed.     ascorbic acid (VITAMIN C) 1000 MG tablet Take 1,000 mg by mouth daily.     aspirin 81 MG EC tablet Take 81 mg by mouth daily.     carvedilol (COREG) 3.125 MG tablet Take 1 tablet (3.125 mg total) by mouth 2 (two) times daily. 60 tablet 0   Cholecalciferol 10 MCG (400 UNIT) CAPS Take 400 Units by mouth daily.     furosemide (LASIX) 40 MG tablet Take 1 tablet (40 mg total) by mouth daily. 30 tablet 0   lisinopril (ZESTRIL) 5 MG tablet Take 1 tablet (5 mg total) by mouth daily. 30 tablet 0   Multiple Vitamin (MULTIVITAMIN) capsule Take 1 capsule by mouth daily.     Multiple Vitamins-Minerals (THERA-M) TABS Take 1 tablet by mouth daily.     pantoprazole (PROTONIX) 40 MG tablet Take 40 mg by mouth daily.     senna-docusate (SENOKOT-S) 8.6-50 MG tablet Take 1 tablet by mouth at bedtime as needed for mild constipation. 20 tablet 0   simvastatin (ZOCOR) 20 MG tablet Take 20 mg by mouth daily.     umeclidinium bromide (INCRUSE ELLIPTA) 62.5 MCG/INH AEPB Inhale into the lungs daily. 30 each 0   No facility-administered medications prior to visit.    Review of Systems  Constitutional:  Negative for chills, diaphoresis, fever, malaise/fatigue and weight loss.  HENT:  Negative for congestion.   Respiratory:  Positive for shortness of breath. Negative  for cough, hemoptysis, sputum production and wheezing.   Cardiovascular:  Negative for chest pain, palpitations and leg swelling.    Objective:   Vitals:   10/14/21 1356  BP: 130/80  Pulse: 66  Temp: 97.9 F (36.6 C)  TempSrc: Oral  SpO2: 100%  Weight: 125 lb (56.7 kg)  Height: _0  (1.753 m)     Physical Exam: General: Well-appearing, no acute distress HENT: [place], AT Eyes: EOMI, no scleral icterus Respiratory: Clear to auscultation bilaterally.  No crackles, wheezing or rales Cardiovascular: RRR, -M/R/G, no  JVD Extremities:-Edema,-tenderness Neuro: AAO x4, CNII-XII grossly intact Psych: Normal mood, normal affect  Data Reviewed:  Imaging: CXR 08/26/21 S/p left pneumonectomy. Complete opacification of left hemithorax. Pleural based calcifications. No infiltrate effusion or edema.  PFT: None on file  Labs: CBC    Component Value Date/Time   WBC 4.3 09/15/2021 0246   RBC 3.39 (L) 09/15/2021 0246   HGB 9.5 (L) 09/15/2021 0246   HGB 12.2 (L) 09/26/2014 0431   HCT 31.0 (L) 09/15/2021 0246   HCT 36.9 (L) 09/26/2014 0431   PLT 218 09/15/2021 0246   PLT 199 09/26/2014 0431   MCV 91.4 09/15/2021 0246   MCV 88 09/26/2014 0431   MCH 28.0 09/15/2021 0246   MCHC 30.6 09/15/2021 0246   RDW 13.0 09/15/2021 0246   RDW 13.1 09/26/2014 0431   LYMPHSABS 0.9 09/05/2021 0926   LYMPHSABS 1.9 09/26/2014 0431   MONOABS 0.4 09/05/2021 0926   MONOABS 0.7 09/26/2014 0431   EOSABS 0.1 09/05/2021 0926   EOSABS 0.2 09/26/2014 0431   BASOSABS 0.0 09/05/2021 0926   BASOSABS 0.1 09/26/2014 0431   Absolute eosinophil  09/26/2014-200 09/05/2021-100     Assessment & Plan:   Discussion: 70 year old male with multiple comorbidities including history of squamous cell lung cancer status post left pneumonectomy with chronic pleural effusion, failure to thrive, emphysema, moderate pulmonary hypertension who class III presents for follow-up.  Shortness of breath is likely multifactorial from hypercarbic respiratory failure, pulmonary hypertension and deconditioning. Family has inquired if he is a candidate for transplant. I would like to confirm his diagnosis and severity of COPD with outpatient PFTs first.  Acute on chronic hypoxemic and hypercapneic respiratory failure secondary to ?pneumonectomy sequelae  --CONTINUE non-invasive ventilation nightly --Our office will contact [place] for BiPAP settings and adjust ventilator settings --CONTINUE 3L oxygen with activity and sleep  Severe emphysema   --CONTINUE Wixela ONE puff TWICE a day. Please call office to update [place] on strength of inhaler --Planning for [facility] outpatient PFTs. Please obtain copy for our records --Referred to  Pulmonary Rehab  Hx squamous cell lung cancer s/p left pneumonectomy in 2005 with chronic pleural effusion S/p thoracentesis 9/2 (900cc thick pleural fluid), 9/6 for symptomatic relief and r/o infection.  Mild-moderate pulmonary hypertension, WHO group III --Not a candidate for selective pulmonary artery vasodilators   Health Maintenance Immunization History  Administered Date(s) Administered   Fluad Quad(high Dose 65+) 09/09/2021   Influenza, Seasonal, Injecte, Preservative Fre 03/15/2010   Influenza-Unspecified 08/23/2016   CT Lung Screen - not qualified  Orders Placed This Encounter  Procedures   AMB referral to pulmonary rehabilitation    Referral Priority:   Routine    Referral Type:   Consultation    Number of Visits Requested:   1  No orders of the defined types were placed in this encounter.   Return in about 3 months (around 01/14/2022).  I have spent a total time of 36-minutes on  the day of the appointment reviewing prior documentation, coordinating care and discussing medical diagnosis and plan with the patient/family. Imaging, labs and tests included in this note have been reviewed and interpreted independently by me.  [place], MD [place] [facility] 10/14/2021 1:25 PM  Office Number [phone]

## 2021-10-14 NOTE — Patient Instructions (Addendum)
Acute on chronic hypoxemic and hypercapneic respiratory failure secondary to ?pneumonectomy sequelae  --CONTINUE non-invasive ventilation nightly --Our office will contact DME for BiPAP settings and adjust ventilator settings --CONTINUE 3L oxygen with activity and sleep  Severe emphysema  --CONTINUE Wixela ONE puff TWICE a day. Please call office to update [place] on strength of inhaler --Planning for [facility] outpatient PFTs. Please obtain copy for our records  Follow-up with me 3 months

## 2021-10-17 ENCOUNTER — Encounter: Payer: Self-pay | Admitting: Pulmonary Disease

## 2021-10-18 ENCOUNTER — Telehealth: Payer: Self-pay | Admitting: Pulmonary Disease

## 2021-10-18 NOTE — Telephone Encounter (Signed)
Call returned to patient, confirmed DOB. Patient wanted to know should he wait to bring his cpap download until his appt in January. I made him aware he needs to bring the SD card as soon as he is able so that we can review his compliance and make any changes as needed. Updated med list with Wixela 250.   Nothing further needed at this time.

## 2021-12-13 ENCOUNTER — Telehealth: Payer: Self-pay | Admitting: Pulmonary Disease

## 2021-12-13 NOTE — Telephone Encounter (Signed)
Spoke with the pt  He states his breathing is no better since the last visit  He has runny nose all day until puts on NIV at night and then when he wakes up his nose is stuffy  OV with Beth for eval on 12/20/21   He is also requesting that Dr Loanne Drilling write a letter for him explaining his condition and the asbestos he could have been exposed to. He wants the letter to discuss the different things a [place] can be exposed to that can cause his type of lung dz. Dr Loanne Drilling, please advise on letter, thanks!

## 2021-12-14 DIAGNOSIS — Z9889 Other specified postprocedural states: Secondary | ICD-10-CM

## 2021-12-14 DIAGNOSIS — R1013 Epigastric pain: Secondary | ICD-10-CM

## 2021-12-20 ENCOUNTER — Ambulatory Visit: Payer: Medicare Other | Admitting: Primary Care

## 2021-12-23 ENCOUNTER — Encounter: Payer: Self-pay | Admitting: Pulmonary Disease

## 2021-12-23 NOTE — Telephone Encounter (Signed)
Letter sent and available in EMR. Please check if patient can view letter.

## 2021-12-23 NOTE — Progress Notes (Addendum)
Letter sent.

## 2021-12-26 ENCOUNTER — Telehealth: Payer: Self-pay

## 2021-12-26 NOTE — Telephone Encounter (Signed)
Called and spoke with patient to see if he received the letter that Dr. Loanne Drilling did for him on his mychart. Patient states that he did receive it and was able to view it. Nothing further needed at this time.

## 2021-12-26 NOTE — Telephone Encounter (Signed)
-----  Message from [place], MD sent at 12/20/2021  3:54 PM EST ----- Regarding: BiPAP report Please obtain 3 month BiPAP compliance report and faxed to our list. - JE

## 2021-12-26 NOTE — Telephone Encounter (Signed)
Yes still need BiPAP report for the last three months. And place in my file for review next week

## 2021-12-26 NOTE — Telephone Encounter (Signed)
Do you still need this report printed? And who does it need to be faxed to?

## 2021-12-28 NOTE — Telephone Encounter (Signed)
Compliance report printed and placed on Pod for review for provider.   Nothing further needed at this time.

## 2022-01-02 ENCOUNTER — Encounter: Payer: Self-pay | Admitting: Pulmonary Disease

## 2022-01-02 ENCOUNTER — Ambulatory Visit (INDEPENDENT_AMBULATORY_CARE_PROVIDER_SITE_OTHER): Payer: Medicare Other | Admitting: Pulmonary Disease

## 2022-01-02 ENCOUNTER — Other Ambulatory Visit: Payer: Self-pay

## 2022-01-02 VITALS — BP 142/78 | HR 93 | Temp 98.3°F | Ht 69.0 in | Wt 125.4 lb

## 2022-01-02 DIAGNOSIS — J9612 Chronic respiratory failure with hypercapnia: Secondary | ICD-10-CM | POA: Diagnosis not present

## 2022-01-02 DIAGNOSIS — J432 Centrilobular emphysema: Secondary | ICD-10-CM | POA: Diagnosis not present

## 2022-01-02 MED ORDER — FLUTICASONE-SALMETEROL 250-50 MCG/ACT IN AEPB: 1.0000 | INHALATION_SPRAY | Freq: Two times a day (BID) | RESPIRATORY_TRACT | 3 refills | Status: DC

## 2022-01-02 MED ORDER — LISINOPRIL 5 MG PO TABS: 5.0000 mg | ORAL_TABLET | Freq: Every day | ORAL | 0 refills | Status: DC

## 2022-01-02 MED ORDER — FUROSEMIDE 40 MG PO TABS: 40.0000 mg | ORAL_TABLET | Freq: Every day | ORAL | 0 refills | Status: DC

## 2022-01-02 MED ORDER — SPIRIVA RESPIMAT 2.5 MCG/ACT IN AERS: 2.0000 | INHALATION_SPRAY | Freq: Every day | RESPIRATORY_TRACT | 3 refills | Status: DC

## 2022-01-02 NOTE — Progress Notes (Signed)
Subjective:   PATIENT ID: [name] [name] GENDER: male DOB: [birth year], MRN: [id number]   HPI  Chief Complaint  Patient presents with   Follow-up    Emphysema    Reason for Visit: Follow-up  Mr. [name] is a 71 year old male prior tobacco abuse (reported 10 pack years) with emphysema, mild/mod pulmonary hypertension, history of squamous cell lung cancer status postpneumonectomy in 2005 and chemotherapy, hyperlipidemia, hypertension and type 2 diabetes who presents for hospital follow-up. Daughter is present and provides additional history  He was recently hospitalized for acute hypoxemic and hypercarbic respiratory failure secondary to right heart failure. Right heart cath with mild-moderate PH.  Left pleural fluid likely chronic due to his pneumonectomy. No evidence of empyema on 9/2 pleural studies. For his chronic hypercarbic failure he was recommended for NIV prior to discharge. His respiratory failure is likely a sequelae of right pneumonectomy for squamous cell lung cancer resulting in right pulmonary hypertension and right heart failure in the setting of chronic lung disease.   01/02/22 He reports he has been struggling to breath despite being on BiPAP. He will awaken at times from not breathing. He reports worsening nasal congestion since using oxygen and NIV. He reports headaches that began five days. Daughter presents for additional history. He seems to be gasping with speech despite compliance with oxygen. Denies coughing or wheezing. He is able to walk briskly but feels fatigued after activity.  Social History: Former smoker  Past Medical History:  Diagnosis Date   Cancer ([place])    lung   CHF (congestive heart failure) ([place])    Diabetes mellitus without complication ([place])    Dyspnea    Hyperlipidemia    Hypertension     No Known Allergies   Outpatient Medications Prior to Visit  Medication Sig Dispense Refill   acetaminophen (TYLENOL) 325 MG tablet Take 650 mg  by mouth every 6 (six) hours as needed for headache or mild pain.     albuterol (VENTOLIN HFA) 108 (90 Base) MCG/ACT inhaler Inhale 1-2 puffs into the lungs every 6 (six) hours as needed.     ascorbic acid (VITAMIN C) 1000 MG tablet Take 1,000 mg by mouth daily.     carvedilol (COREG) 3.125 MG tablet Take 1 tablet (3.125 mg total) by mouth 2 (two) times daily. 60 tablet 0   Cholecalciferol 10 MCG (400 UNIT) CAPS Take 400 Units by mouth daily.     furosemide (LASIX) 40 MG tablet Take 1 tablet (40 mg total) by mouth daily. 30 tablet 0   lisinopril (ZESTRIL) 5 MG tablet Take 1 tablet (5 mg total) by mouth daily. 30 tablet 0   Multiple Vitamin (MULTIVITAMIN) capsule Take 1 capsule by mouth daily.     Multiple Vitamins-Minerals (THERA-M) TABS Take 1 tablet by mouth daily.     pantoprazole (PROTONIX) 40 MG tablet Take 40 mg by mouth daily.     senna-docusate (SENOKOT-S) 8.6-50 MG tablet Take 1 tablet by mouth at bedtime as needed for mild constipation. 20 tablet 0   simvastatin (ZOCOR) 20 MG tablet Take 10 mg by mouth daily.     aspirin 81 MG EC tablet Take 81 mg by mouth daily. (Patient not taking: Reported on 01/02/2022)     fluticasone-salmeterol (ADVAIR) 250-50 MCG/ACT AEPB Inhale 1 puff into the lungs in the morning and at bedtime. (Patient not taking: Reported on 01/02/2022)     No facility-administered medications prior to visit.    Review of Systems  Constitutional:  Positive  for malaise/fatigue. Negative for chills, diaphoresis, fever and weight loss.  HENT:  Negative for congestion.   Respiratory:  Positive for shortness of breath. Negative for cough, hemoptysis, sputum production and wheezing.   Cardiovascular:  Negative for chest pain, palpitations and leg swelling.    Objective:   Vitals:   01/02/22 1512  BP: (!) 142/78  Pulse: 93  Temp: 98.3 F (36.8 C)  TempSrc: Oral  SpO2: 98%  Weight: 125 lb 6.4 oz (56.9 kg)  Height: _0  (1.753 m)  SpO2: 98 % (3L) O2 Device: Nasal  cannula O2 Flow Rate (L/min): 3 L/min O2 Type: Continuous O2  Physical Exam: General: Well-appearing, no acute distress HENT: [place], AT Eyes: EOMI, no scleral icterus Respiratory: Clear to auscultation bilaterally.  No crackles, wheezing or rales Cardiovascular: RRR, -M/R/G, no JVD Extremities:-Edema,-tenderness Neuro: AAO x4, CNII-XII grossly intact Psych: Normal mood, normal affect  Data Reviewed:  Imaging: CXR 08/26/21 S/p left pneumonectomy. Complete opacification of left hemithorax. Pleural based calcifications. No infiltrate effusion or edema.  PFT: [facility] 10/31/21  FVC 1.17 (30%) FEV1 0.42 (14%) Ratio 36  TLC 62% DLCO 7.8% Interpretation: Very severe obstructive and restrictive defect with severely reduced gas exchange  Labs: CBC    Component Value Date/Time   WBC 4.3 09/15/2021 0246   RBC 3.39 (L) 09/15/2021 0246   HGB 9.5 (L) 09/15/2021 0246   HGB 12.2 (L) 09/26/2014 0431   HCT 31.0 (L) 09/15/2021 0246   HCT 36.9 (L) 09/26/2014 0431   PLT 218 09/15/2021 0246   PLT 199 09/26/2014 0431   MCV 91.4 09/15/2021 0246   MCV 88 09/26/2014 0431   MCH 28.0 09/15/2021 0246   MCHC 30.6 09/15/2021 0246   RDW 13.0 09/15/2021 0246   RDW 13.1 09/26/2014 0431   LYMPHSABS 0.9 09/05/2021 0926   LYMPHSABS 1.9 09/26/2014 0431   MONOABS 0.4 09/05/2021 0926   MONOABS 0.7 09/26/2014 0431   EOSABS 0.1 09/05/2021 0926   EOSABS 0.2 09/26/2014 0431   BASOSABS 0.0 09/05/2021 0926   BASOSABS 0.1 09/26/2014 0431   Absolute eosinophil  09/26/2014-200 09/05/2021-100  ABG    Component Value Date/Time   PHART 7.378 08/23/2021 0920   PCO2ART 76.1 (HH) 08/23/2021 0920   PO2ART 156 (H) 08/23/2021 0920   HCO3 40.9 (H) 08/24/2021 1234   HCO3 42.4 (H) 08/24/2021 1234   TCO2 43 (H) 08/24/2021 1234   TCO2 45 (H) 08/24/2021 1234   O2SAT 68.0 08/24/2021 1234   O2SAT 66.0 08/24/2021 1234   SD Card Compliance Usage Days 09/15/21-11/13/21 - 53/59 days (89%) >4 hours - 85% Average Usage - 5  hours 21 min Average AHI 1.4    Assessment & Plan:   Discussion: 71 year old male with multiple co-morbidities including history of squamous cell lung cancer s/p left pneumonectomy with chronic pleural effusion, emphysema, moderate hypertension WHO Group III who presents for follow-up.  Ambulatory O2 demonstrated increased O2 requirement from 3 to 8L with exertion. Euvolemic on exam. Family expresses interest in lung transplant referral.   Referral placed with COPD criteria:  BODE 5.  FEV1 14%.  Chronic hypercapnia PaCO2 >50 mm Hg Mild-mod PH confirmed on RHC 08/24/21 Recent hospitalization for acute on chronic hypercarbic respiratory failure  Chronic hypoxemic and hypercapneic respiratory failure secondary to ?pneumonectomy sequelae, worsening oxygen requirement --CONTINUE non-invasive ventilation nightly (BPAP EPAP 4-8, IPAP 12) --INCREASED to 8L oxygen with activity  --ORDER humidifier --DME: [employer] --ORDER ABG. If CO2 is not controlled, will need AVAPS  --Refer to  Lung Transplant program for evaluation  Severe emphysema  --START Spiriva TWO puffs ONCE a day --CONTINUE Wixela 250-50 ONE puff TWICE a day --Has been contacted [facility]  Mild-moderate pulmonary hypertension, WHO group III --Not a candidate for selective pulmonary artery vasodilators --PRINTED RX for lisinopril and one week of PRN lasix --Please ask primary care doctor to order BMET (to check kidneys)   Health Maintenance Immunization History  Administered Date(s) Administered   Fluad Quad(high Dose 65+) 09/09/2021   Influenza, Seasonal, Injecte, Preservative Fre 03/15/2010   Influenza-Unspecified 08/23/2016, 09/09/2021   Tdap 09/27/2021   Zoster Recombinat (Shingrix) 09/27/2021   CT Lung Screen - not qualified  No orders of the defined types were placed in this encounter. No orders of the defined types were placed in this encounter.   No follow-ups on file.  I have spent a total time  of 36-minutes on the day of the appointment reviewing prior documentation, coordinating care and discussing medical diagnosis and plan with the patient/family. Imaging, labs and tests included in this note have been reviewed and interpreted independently by me.  [place], MD [place] [facility] 01/02/2022 4:00 PM  Office Number [phone]

## 2022-01-02 NOTE — Patient Instructions (Addendum)
Severe emphysema  --START Spiriva TWO puffs ONCE a day --CONTINUE Wixela 250-50 ONE puff TWICE a day --CONTINUE 8L oxygen with activity  --ORDER humidifier --DME: [employer] --Amb referral to [facility] to evaluate for tranplant  Chronic hypoxemic and hypercapneic respiratory failure secondary to ?pneumonectomy sequelae  --CONTINUE non-invasive ventilation nightly --Has been contacted [facility]  Mild-moderate pulmonary hypertension, WHO group III --Not a candidate for selective pulmonary artery vasodilators --PRINTED RX for lisinopril and one week of PRN lasix --Please ask primary care doctor to order BMET (to check kidneys)  Follow-up with me in 3 months

## 2022-01-03 ENCOUNTER — Encounter: Payer: Self-pay | Admitting: Pulmonary Disease

## 2022-01-04 ENCOUNTER — Telehealth: Payer: Self-pay | Admitting: Pulmonary Disease

## 2022-01-04 NOTE — Telephone Encounter (Signed)
Called and spoke with patient about the new prescription of Sprivia inhaler. He wants to know the mechanism of the medication and what is it supposed to do to help him.   Dr Loanne Drilling please advise

## 2022-01-04 NOTE — Telephone Encounter (Signed)
Called and spoke with patient about Spriva. Informed patient that it does help his lung function over time and the Dr Loanne Drilling advises him to continue to use the inhaler as directed. Patient voiced understanding and had no questions or concerns. Nothing else further needed at this time

## 2022-01-04 NOTE — Telephone Encounter (Signed)
Spiriva is a long acting muscarinic antagonist that acts on the smooth muscles in the lung and causes bronchodilator. It helps with shortness of breath and improves lung function. I recommend he continues taking it as directed for his current lung health.

## 2022-01-06 ENCOUNTER — Telehealth: Payer: Self-pay | Admitting: Pulmonary Disease

## 2022-01-06 DIAGNOSIS — J432 Centrilobular emphysema: Secondary | ICD-10-CM

## 2022-01-06 DIAGNOSIS — J9612 Chronic respiratory failure with hypercapnia: Secondary | ICD-10-CM

## 2022-01-06 NOTE — Telephone Encounter (Signed)
Creating a new encounter with message from [place] as this encounter is already closed and pertaining to something else. Please see new encounter for further information.

## 2022-01-06 NOTE — Telephone Encounter (Signed)
Called and spoke with patient to let him know that I have placed order for humidification. He expressed understanding. Nothing further needed at this time.

## 2022-01-23 ENCOUNTER — Ambulatory Visit (HOSPITAL_COMMUNITY)
Admission: RE | Admit: 2022-01-23 | Discharge: 2022-01-23 | Disposition: A | Discharge status: Home / Self Care | Payer: Medicare Other | Source: Ambulatory Visit | Attending: Pulmonary Disease | Admitting: Pulmonary Disease

## 2022-01-23 DIAGNOSIS — J432 Centrilobular emphysema: Secondary | ICD-10-CM | POA: Insufficient documentation

## 2022-01-23 LAB — BLOOD GAS, ARTERIAL
Acid-Base Excess: 11.4 mmol/L — ABNORMAL HIGH (ref 0.0–2.0)
Bicarbonate: 37.4 mmol/L — ABNORMAL HIGH (ref 20.0–28.0)
FIO2: 32
O2 Saturation: 99.4 %
Patient temperature: 37
pCO2 arterial: 71.3 mmHg (ref 32.0–48.0)
pH, Arterial: 7.34 — ABNORMAL LOW (ref 7.350–7.450)
pO2, Arterial: 233 mmHg — ABNORMAL HIGH (ref 83.0–108.0)

## 2022-01-23 NOTE — Progress Notes (Signed)
Patient in today for ABG draw on 3 LPM [place].  Results in computer and MD called with panic results.

## 2022-01-23 NOTE — Progress Notes (Signed)
[facility] Progress Note  Contacted by RT regarding abnormal ABG. 7.34/71/233  Assessment Chronic hypercarbic respiratory failure ABG reviewed and consistent with chronic medical condition --OK to discharge home --Continue NIV nightly  Rodman Pickle, M.D. [facility]/Critical Care Medicine 01/23/2022 12:05 PM   See Amion for personal pager For hours between 7 PM to 7 AM, please call [place] for urgent questions

## 2022-01-26 ENCOUNTER — Telehealth: Payer: Self-pay | Admitting: Pulmonary Disease

## 2022-01-26 NOTE — Telephone Encounter (Signed)
Called and spoke with patient. He stated that he wanted to speak with Dr. Loanne Drilling in regards to a conversation he had with one of his providers at the [facility] in [place]. I tried to get more information but he stated that he only wanted to speak with Dr. Loanne Drilling. I made him aware that she is not available today and would be working in the hospital tomorrow. He verbalized understanding.   He can be reached at [phone]  Dr. Loanne Drilling, can you please advise? Thanks!

## 2022-01-27 NOTE — Telephone Encounter (Signed)
Please schedule patient for a telephone visit when I am in clinic next week.

## 2022-01-27 NOTE — Telephone Encounter (Signed)
Patient would like to speak to Dr. Loanne Drilling. Patient phone number is [phone].

## 2022-01-30 ENCOUNTER — Telehealth: Payer: Self-pay | Admitting: Pulmonary Disease

## 2022-01-30 NOTE — Telephone Encounter (Signed)
Patient requires Trilogy Ventilatory for chronic hypoxemic and hypercarbic respiratory failure with the following settings:  Trilogy Parameters  _0  VAPS VT: 6-8cc/kg IBW, RR 8-15 BPM, IPAP Min 8-12, IPAP Max 12-32, EPAP 4-12, AVAPS Rate 3-5  _1  AVAPS AE VT: 6-8 cc/kg IBW, RR 8-15 BPM, PS Min 4-10, PS Max 8-20, EPAP Min 4-12, EPAP Max 12-20, Max Pressure 32, AVAPS Rate 3-5   _2  Duel Setting for Exertion or Respiratory Distress VT: 10cc/kg IBW, RR 14-16, AVAPS Rate 5  _3  Set Volume, Pressure and Rate Alarms per patient condition _4  Fit and modify mask as needed  Rodman Pickle, M.D. [facility] Medicine 01/30/2022 12:50 PM

## 2022-01-30 NOTE — Telephone Encounter (Signed)
Noted.  Order faxed to correct fax number.  Received successful fax confirmation

## 2022-01-30 NOTE — Telephone Encounter (Signed)
Spoke to patient and offered telephone visit for this week. He declined and requested that I get a message over to Dr. Loanne Drilling about trilogy vent.  He stated that the [facility] need a Rx with settings. Order needs to be faxed to [facility] at [phone]. He would like to be made aware when Rx has been faxed.  Dr. Loanne Drilling, please advise. Thanks

## 2022-01-30 NOTE — Telephone Encounter (Signed)
Order faxed to [facility] at [phone]. Patient is aware and voiced his understanding.  Nothing further needed.

## 2022-01-31 ENCOUNTER — Telehealth: Payer: Self-pay | Admitting: Pulmonary Disease

## 2022-02-01 NOTE — Telephone Encounter (Signed)
I called the [facility] and spoke with [facility] and let her know that I was faxing a face sheet and she was asked to call back with any questions.

## 2022-03-03 ENCOUNTER — Telehealth: Payer: Self-pay | Admitting: Pulmonary Disease

## 2022-03-03 NOTE — Telephone Encounter (Signed)
Called patient and he wanted to make sure it was ok for him to take mucinex or Claritin when his allergies act up. He states he only takes one of the other when his allergies start acting up on him and he just wanted to make sure this was ok.  ? ?Dr Loanne Drilling please advise  ?

## 2022-03-03 NOTE — Telephone Encounter (Signed)
Called and spoke with patient. He verbalized understanding. ? ?Nothing further needed at time of call.  ?

## 2022-03-03 NOTE — Telephone Encounter (Signed)
Yes this is OK to take both at the same time as he wishes. No contraindication to any meds on his current list we have on EMR. ?

## 2022-03-06 IMAGING — DX DG CHEST 1V PORT
2 series · 2 of 2 positions shown · non-contrast
Comparison: Multiple previous imaging studies.

CLINICAL DATA: Chest pain.

EXAM:
PORTABLE CHEST 1 VIEW

[chest ap (1 of 2)]
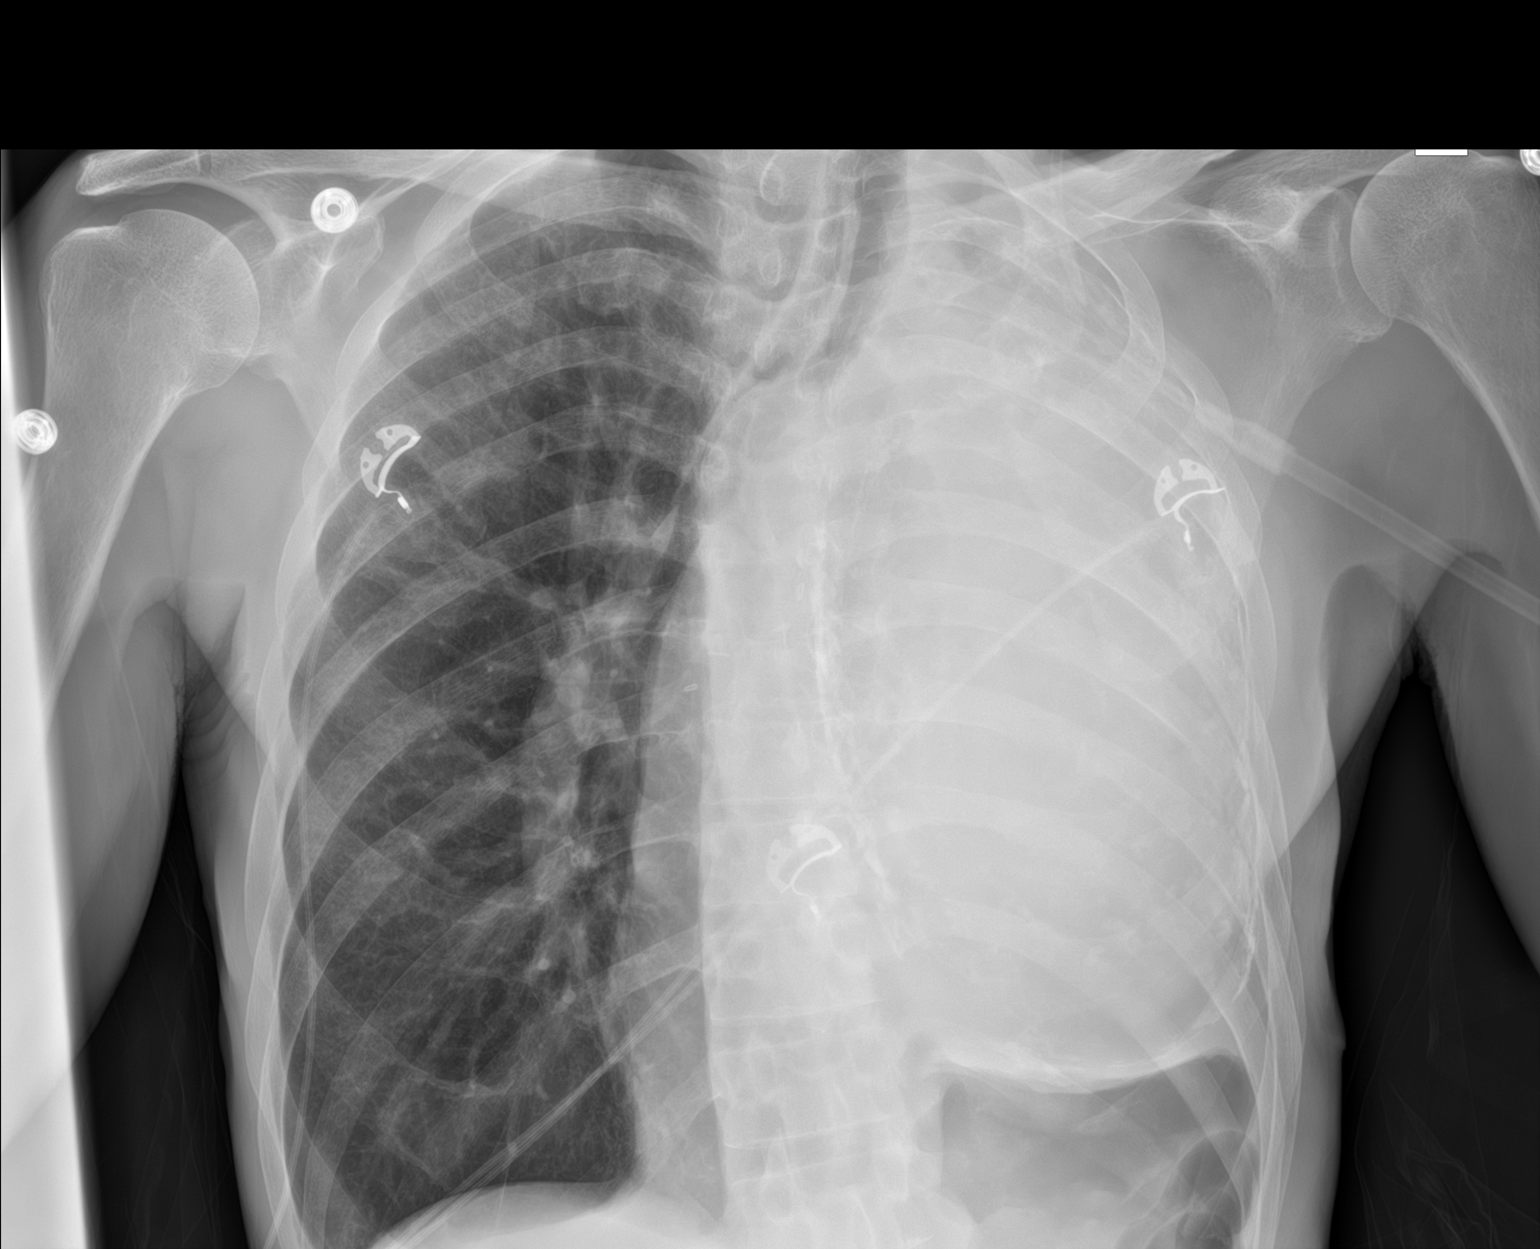

[chest ap (2 of 2)]
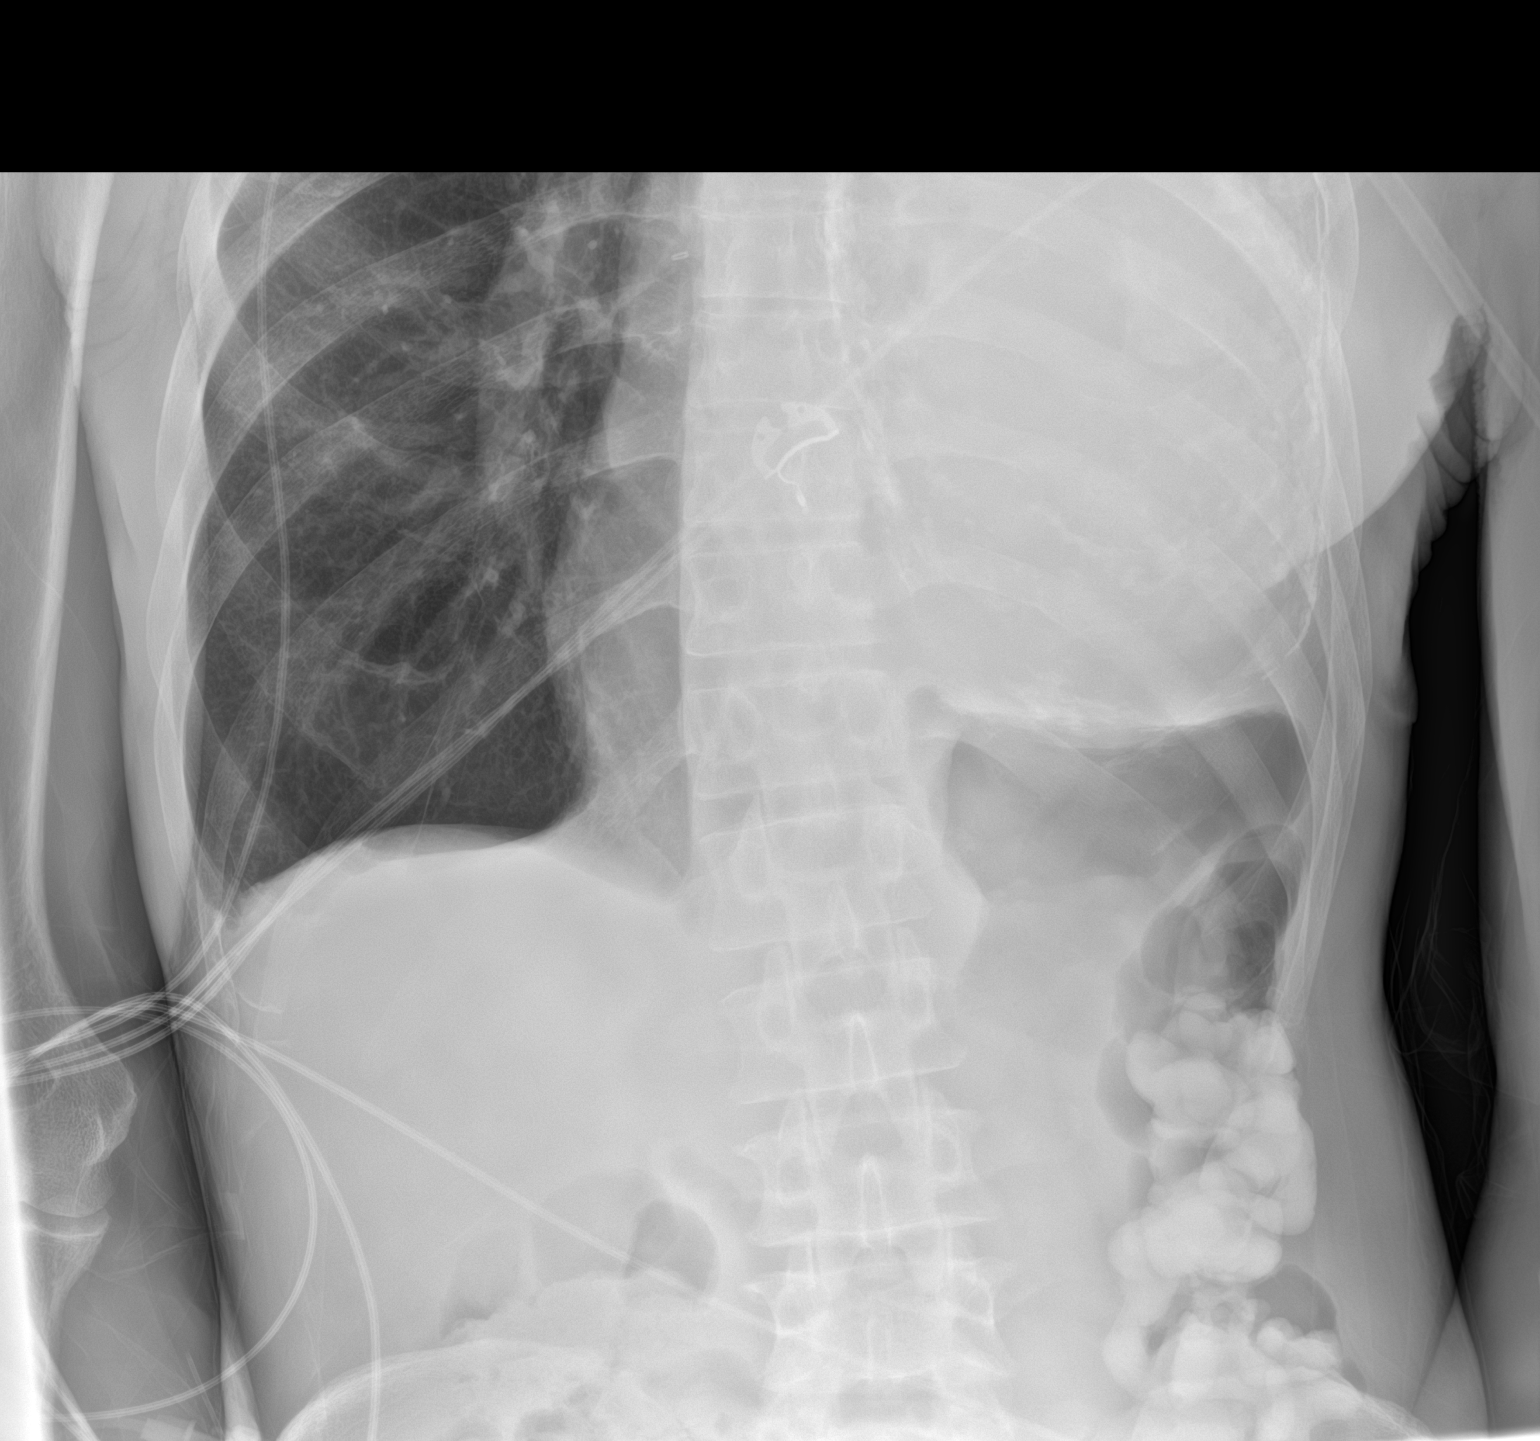

[2 of 2 positions shown; findings below may reference images not displayed]

FINDINGS: Stable opacification of the left hemithorax with rim-like
calcifications. History of pneumonectomy. The right lung is
unremarkable. No pulmonary infiltrates or effusion. Moderate
emphysematous changes.
IMPRESSION: 1. Stable opacification of the left hemithorax.
2. No acute pulmonary findings.

## 2022-03-20 ENCOUNTER — Ambulatory Visit: Payer: Medicare Other | Admitting: Pulmonary Disease

## 2022-03-23 ENCOUNTER — Telehealth: Payer: Self-pay | Admitting: Pulmonary Disease

## 2022-03-24 NOTE — Telephone Encounter (Signed)
Called patient and he advises that he got the vent from the [place]. He states he is having issues with the current settings that the [place] has. Patient wants to know what are the current settings that Dr Florina Ou has for the vent.  ? ? ?Dr Loanne Drilling please advise ?

## 2022-03-24 NOTE — Telephone Encounter (Signed)
The settings that I use are standard and have a range that will titrate itself to the patient. But I will message sleep physicians if we can perform in-lab titration at [facility]. ? ?Trilogy Parameters ? ?_0  VAPS ?VT: 6-8cc/kg IBW, RR 8-15 BPM, IPAP Min 8-12, IPAP Max 12-32, EPAP 4-12, AVAPS Rate 3-5 ? ?_1  AVAPS AE ?VT: 6-8 cc/kg IBW, RR 8-15 BPM, PS Min 4-10, PS Max 8-20, EPAP Min 4-12, EPAP Max 12-20, Max Pressure 32, AVAPS Rate 3-5 ? ? ?_2  Duel Setting for Exertion or Respiratory Distress VT: 10cc/kg IBW, RR 14-16, AVAPS Rate 5 ? ?_3  Set Volume, Pressure and Rate Alarms per patient condition ?_4  Fit and modify mask as needed ? ?

## 2022-03-25 NOTE — Telephone Encounter (Signed)
This is beyond the scope of work for most of the sleep techs. I have usually contacted the providing DME company and discussed directly with them. This would need to be a [place] discussion, not just a DME order. ?You could try directly discussing with Jacolyn Reedy, lead (daytime) tech at sleep center. He is certified RT as well as Sleep Tech.He usually works Monday, Tuesday  and Wednesday.  ?

## 2022-04-10 ENCOUNTER — Encounter: Payer: Self-pay | Admitting: Pulmonary Disease

## 2022-04-10 ENCOUNTER — Ambulatory Visit (INDEPENDENT_AMBULATORY_CARE_PROVIDER_SITE_OTHER): Payer: Medicare Other | Admitting: Pulmonary Disease

## 2022-04-10 VITALS — BP 122/80 | HR 79 | Ht 69.0 in | Wt 127.8 lb

## 2022-04-10 DIAGNOSIS — J9612 Chronic respiratory failure with hypercapnia: Secondary | ICD-10-CM | POA: Diagnosis not present

## 2022-04-10 DIAGNOSIS — J432 Centrilobular emphysema: Secondary | ICD-10-CM

## 2022-04-10 NOTE — Patient Instructions (Addendum)
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 ?Severe emphysema  ?--CONTINUE Spiriva TWO puffs ONCE a day ?--CONTINUE Wixela 250-50 ONE puff TWICE a day ?--Please consider [facility] to build up your endurance and stamina ? ?Follow-up with me in 3 months ?

## 2022-04-10 NOTE — Progress Notes (Signed)
? ? ?Subjective:  ? ?PATIENT ID: [name] [name] GENDER: male DOB: [birth year], MRN: [id number] ? ? ?HPI ? ?Chief Complaint  ?Patient presents with  ? Follow-up  ?  3L o2 feeling better no change or updates  ? ?Reason for Visit: Follow-up ? ?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 ? ?He was recently hospitalized for acute hypoxemic and hypercarbic respiratory failure secondary to right heart failure. Right heart cath with mild-moderate PH.  Left pleural fluid likely chronic due to his pneumonectomy. No evidence of empyema on 9/2 pleural studies. For his chronic hypercarbic failure he was recommended for NIV prior to discharge. His respiratory failure is likely a sequelae of right pneumonectomy for squamous cell lung cancer resulting in right pulmonary hypertension and right heart failure in the setting of chronic lung disease.  ? ?01/02/22 ?He reports he has been struggling to breath despite being on BiPAP. He will awaken at times from not breathing. He reports worsening nasal congestion since using oxygen and NIV. He reports headaches that began five days. Daughter presents for additional history. He seems to be gasping with speech despite compliance with oxygen. Denies coughing or wheezing. He is able to walk briskly but feels fatigued after activity. ? ?04/10/22 ?Since our last visit, he is compliant with his O2. He is using 3-4L. Has not started pulmonary rehab. He has lost motivation after being denied for lung transplant. At baseline, he is walking around the house. He is compliant with his NIV. He reports difficulty tolerating the machine and will awake him at night. ? ?Social History: ?Former smoker ? ?Past Medical History:  ?Diagnosis Date  ? Cancer [facility])   ?  lung  ? CHF (congestive heart failure) ([place])   ? Diabetes mellitus without complication ([place])   ? Dyspnea   ? Hyperlipidemia   ? Hypertension   ?  ?No Known Allergies  ? ?Outpatient Medications Prior to Visit  ?Medication Sig Dispense Refill  ? acetaminophen (TYLENOL) 325 MG tablet Take 650 mg by mouth every 6 (six) hours as needed for headache or mild pain.    ? albuterol (VENTOLIN HFA) 108 (90 Base) MCG/ACT inhaler Inhale 1-2 puffs into the lungs every 6 (six) hours as needed.    ? ascorbic acid (VITAMIN C) 1000 MG tablet Take 1,000 mg by mouth daily.    ? aspirin 81 MG EC tablet Take 81 mg by mouth daily.    ? carvedilol (COREG) 3.125 MG tablet Take 1 tablet (3.125 mg total) by mouth 2 (two) times daily. 60 tablet 0  ? Cholecalciferol 10 MCG (400 UNIT) CAPS Take 400 Units by mouth daily.    ? fluticasone-salmeterol (WIXELA INHUB) 250-50 MCG/ACT AEPB Inhale 1 puff into the lungs in the morning and at bedtime. 180 each 3  ? furosemide (LASIX) 40 MG tablet Take 1 tablet (40 mg total) by mouth daily for 7 days. 30 tablet 0  ? lisinopril (ZESTRIL) 5 MG tablet Take 1 tablet (5 mg total) by mouth daily. 90 tablet 0  ? Multiple Vitamin (MULTIVITAMIN) capsule Take 1 capsule by mouth daily.    ? Multiple Vitamins-Minerals (THERA-M) TABS Take 1 tablet by mouth daily.    ? pantoprazole (PROTONIX) 40 MG tablet Take 40 mg by mouth daily.    ? simvastatin (ZOCOR) 20 MG tablet  Take 10 mg by mouth daily.    ? Tiotropium Bromide Monohydrate (SPIRIVA RESPIMAT) 2.5 MCG/ACT AERS Inhale 2 puffs into the lungs daily. 12 g 3  ? senna-docusate (SENOKOT-S) 8.6-50 MG tablet Take 1 tablet by mouth at bedtime as needed for mild constipation. (Patient not taking: Reported on 04/10/2022) 20 tablet 0  ? ?No facility-administered medications prior to visit.  ? ? ?Review of Systems  ?Constitutional:  Negative for chills, diaphoresis, fever, malaise/fatigue and weight loss.  ?HENT:  Negative for congestion.   ?Respiratory:  Positive for shortness  of breath. Negative for cough, hemoptysis, sputum production and wheezing.   ?Cardiovascular:  Negative for chest pain, palpitations and leg swelling.  ? ? ?Objective:  ? ?Vitals:  ? 04/10/22 1130  ?BP: 122/80  ?Pulse: 79  ?SpO2: 100%  ?Weight: 127 lb 12.8 oz (58 kg)  ?Height: _0  (1.753 m)  ?SpO2: 100 % (3L) ?O2 Device: Nasal cannula ?O2 Flow Rate (L/min): 3 L/min ?O2 Type: Continuous O2 ? ?Physical Exam: ?General: Well-appearing, no acute distress ?HENT: [place], AT ?Eyes: EOMI, no scleral icterus ?Respiratory: Clear to auscultation bilaterally.  No crackles, wheezing or rales ?Cardiovascular: RRR, -M/R/G, no JVD ?Extremities:-Edema,-tenderness ?Neuro: AAO x4, CNII-XII grossly intact ?Psych: Normal mood, normal affect ? ?Data Reviewed: ? ?Imaging: ?CXR 08/26/21 S/p left pneumonectomy. Complete opacification of left hemithorax. Pleural based calcifications. No infiltrate effusion or edema. ? ?PFT: ?[facility] 10/31/21  ?FVC 1.17 (30%) FEV1 0.42 (14%) Ratio 36  TLC 62% DLCO 7.8% ?Interpretation: Very severe obstructive and restrictive defect with severely reduced gas exchange ? ?Labs: ?CBC ?   ?Component Value Date/Time  ? WBC 4.3 09/15/2021 0246  ? RBC 3.39 (L) 09/15/2021 0246  ? HGB 9.5 (L) 09/15/2021 0246  ? HGB 12.2 (L) 09/26/2014 0431  ? HCT 31.0 (L) 09/15/2021 0246  ? HCT 36.9 (L) 09/26/2014 0431  ? PLT 218 09/15/2021 0246  ? PLT 199 09/26/2014 0431  ? MCV 91.4 09/15/2021 0246  ? MCV 88 09/26/2014 0431  ? MCH 28.0 09/15/2021 0246  ? MCHC 30.6 09/15/2021 0246  ? RDW 13.0 09/15/2021 0246  ? RDW 13.1 09/26/2014 0431  ? LYMPHSABS 0.9 09/05/2021 0926  ? LYMPHSABS 1.9 09/26/2014 0431  ? MONOABS 0.4 09/05/2021 0926  ? MONOABS 0.7 09/26/2014 0431  ? EOSABS 0.1 09/05/2021 0926  ? EOSABS 0.2 09/26/2014 0431  ? BASOSABS 0.0 09/05/2021 0926  ? BASOSABS 0.1 09/26/2014 0431  ? ?Absolute eosinophil  ?09/26/2014-200 ?09/05/2021-100 ? ?ABG ?   ?Component Value Date/Time  ? PHART 7.340 (L) 01/23/2022 1140  ? PCO2ART 71.3 (HH)  01/23/2022 1140  ? PO2ART 233 (H) 01/23/2022 1140  ? HCO3 37.4 (H) 01/23/2022 1140  ? TCO2 43 (H) 08/24/2021 1234  ? TCO2 45 (H) 08/24/2021 1234  ? O2SAT 99.4 01/23/2022 1140  ? ?SD Card Compliance ?Usage Days 09/15/21-11/13/21 - 53/59 days (89%) ?>4 hours - 85% ?Average Usage - 5 hours 21 min ?Average AHI 1.4 ?   ?Assessment & Plan:  ? ?Discussion: ?71 year old male with multiple co-morbidities including hx squamous cell lung cancer s/p left pneumonectomy with chronic pleural effusion, emphysema, moderate pulmonary hypertension WHO Group III who presents for follow-up. Not a transplant candidate. ? ?Ambulatory O2 with improved O2 requirement to 3L with exertion. ? ?Chronic hypoxemic and hypercapneic respiratory failure  ?--SpO2 goal >88% ?--CONTINUE supplemental O2 with 3L on exertion. Qualified for POC ?--CONTINUE non-invasive ventilation nightly  ?--[facility] regarding [facility] ?--DME:  [employer] ? ?Severe emphysema  ?--CONTINUE Spiriva TWO puffs ONCE a day ?--CONTINUE Wixela 250-50 ONE puff TWICE a day ?--Please consider [facility] to build up your endurance and stamina ? ?Mild-moderate pulmonary hypertension, WHO group III ?--Not a candidate for selective pulmonary artery vasodilators ? ?Health Maintenance ?Immunization History  ?Administered Date(s) Administered  ? Fluad Quad(high Dose 65+) 09/09/2021  ? Influenza, Seasonal, Injecte, Preservative Fre 03/15/2010  ? Influenza-Unspecified 08/23/2016, 09/09/2021  ? Tdap 09/27/2021  ? Zoster Recombinat (Shingrix) 09/27/2021, 01/03/2022  ? ?CT Lung Screen - not qualified ? ?No orders of the defined types were placed in this encounter. ?No orders of the defined types were placed in this encounter. ? ? ?Return in about 3 months (around 07/10/2022). ? ?I have spent a total time of 32-minutes on the day of the appointment reviewing prior documentation, coordinating care and discussing medical diagnosis and  plan with the patient/family. Past medical history, allergies, medications were reviewed. Pertinent imaging, labs and tests included in this note have been reviewed and interpreted independently by me. ? ?Hanz [place]ter [place]

## 2022-04-18 ENCOUNTER — Encounter: Payer: Self-pay | Admitting: Pulmonary Disease

## 2022-06-15 ENCOUNTER — Encounter: Payer: Self-pay | Admitting: Pulmonary Disease

## 2022-06-22 NOTE — Telephone Encounter (Signed)
Does he need a repeat Desat study or can we use the one from April? Then we can update his oxygen prescription and send POC into his DME. If we can't use this then can we just have him come in for a walk?

## 2022-06-22 NOTE — Telephone Encounter (Signed)
Dr. Loanne Drilling    I attached the sleep Study results to this email. On my last appointment with you. I was explaining about the oxygen tank being too heavy. I am on 3 liters oxygen. And it takes a lot of my energy just to carry it around, do not help with breathing at all. I have been trying to get out more but, with the weight of the tank. Anyway, you said you would look into getting a portable one inogen or something. I would need one with the longest battery life. One that can be recharge on the go. Would you please look into this for me please, also I am very thankful to have a doctor like yourself. One who cares about the well-being of the patient. There is a many physician and practitioner who see [place] as a business. I truly thank you from the bottom of my heart for the care I receive from you. Keep up the good work for I know other patients feel the same.   Thanks [employer]

## 2022-07-21 ENCOUNTER — Encounter: Payer: Medicare Other | Attending: Nurse Practitioner | Admitting: *Deleted

## 2022-07-21 DIAGNOSIS — I2723 Pulmonary hypertension due to lung diseases and hypoxia: Secondary | ICD-10-CM | POA: Insufficient documentation

## 2022-07-21 DIAGNOSIS — K219 Gastro-esophageal reflux disease without esophagitis: Secondary | ICD-10-CM | POA: Insufficient documentation

## 2022-07-21 DIAGNOSIS — J441 Chronic obstructive pulmonary disease with (acute) exacerbation: Secondary | ICD-10-CM | POA: Insufficient documentation

## 2022-07-21 DIAGNOSIS — J449 Chronic obstructive pulmonary disease, unspecified: Secondary | ICD-10-CM | POA: Insufficient documentation

## 2022-07-21 NOTE — Progress Notes (Signed)
Initial telephone orientation completed. Diagnosis can be found in [place] documentation in Care Everywhere. EP orientation scheduled for Tuesday 8/22 at 10am.

## 2022-07-24 ENCOUNTER — Ambulatory Visit: Payer: Medicare Other

## 2022-07-24 ENCOUNTER — Encounter: Payer: Self-pay | Admitting: Pulmonary Disease

## 2022-07-24 ENCOUNTER — Ambulatory Visit (INDEPENDENT_AMBULATORY_CARE_PROVIDER_SITE_OTHER): Payer: Medicare Other | Admitting: Pulmonary Disease

## 2022-07-24 VITALS — BP 116/66 | HR 90 | Temp 97.9°F | Ht 69.0 in | Wt 126.0 lb

## 2022-07-24 DIAGNOSIS — J432 Centrilobular emphysema: Secondary | ICD-10-CM | POA: Diagnosis not present

## 2022-07-24 DIAGNOSIS — J9612 Chronic respiratory failure with hypercapnia: Secondary | ICD-10-CM

## 2022-07-24 MED ORDER — PREDNISONE 20 MG PO TABS: 20.0000 mg | ORAL_TABLET | Freq: Every day | ORAL | 0 refills | Status: DC

## 2022-07-24 NOTE — Progress Notes (Signed)
Subjective:   PATIENT ID: [name] [name] GENDER: male DOB: 06/19/1951, MRN: [id number]   HPI  Chief Complaint  Patient presents with   Follow-up    Pt states he is having a hard time breathing today and states that it has been going on for the past 3 days. Also states that he has some tingling in his throat. Denies any complaints of wheezing.   Reason for Visit: Follow-up  Mr. 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  He was recently hospitalized for acute hypoxemic and hypercarbic respiratory failure secondary to right heart failure. Right heart cath with mild-moderate PH.  Left pleural fluid likely chronic due to his pneumonectomy. No evidence of empyema on 9/2 pleural studies. For his chronic hypercarbic failure he was recommended for NIV prior to discharge. His respiratory failure is likely a sequelae of right pneumonectomy for squamous cell lung cancer resulting in right pulmonary hypertension and right heart failure in the setting of chronic lung disease.   01/02/22 He reports he has been struggling to breath despite being on BiPAP. He will awaken at times from not breathing. He reports worsening nasal congestion since using oxygen and NIV. He reports headaches that began five days. Daughter presents for additional history. He seems to be gasping with speech despite compliance with oxygen. Denies coughing or wheezing. He is able to walk briskly but feels fatigued after activity.  04/10/22 Since our last visit, he is compliant with his O2. He is using 3-4L. Has not started pulmonary rehab. He has lost motivation after being denied for lung transplant. At baseline, he is walking around the house. He is compliant with his NIV. He reports  difficulty tolerating the machine and will awake him at night.  07/24/22 Since our last visit he has been complaint with 3L O2. Pulmonary rehab orientation has been completed and scheduled 8/22. He reports worsening shortness of breath and sore throat that began yesterday. Sometimes this will progress but since it is early he is not sure. He is interested in [employer] device and previously qualified at last visit. Has been compliant with his NIV nightly and recent sleep study was negative for OSA, unfortunately TCO2 not available.  Social History: Former smoker  Past Medical History:  Diagnosis Date   Cancer ([place])    lung   CHF (congestive heart failure) ([place])    Diabetes mellitus without complication ([place])    Dyspnea    Hyperlipidemia    Hypertension     No Known Allergies   Outpatient Medications Prior to Visit  Medication Sig Dispense Refill   acetaminophen (TYLENOL) 325 MG tablet Take 650 mg by mouth every 6 (six) hours as needed for headache or mild pain.     albuterol (VENTOLIN HFA) 108 (90 Base) MCG/ACT inhaler Inhale 1-2 puffs into the lungs every 6 (six) hours as needed.     ascorbic acid (VITAMIN C) 1000 MG tablet Take 1,000 mg by mouth daily.     aspirin 81 MG EC tablet Take 81 mg by mouth daily.     carvedilol (COREG) 3.125 MG tablet Take 1 tablet (3.125 mg total) by mouth 2 (two) times daily. 60 tablet 0   Cholecalciferol 10 MCG (400 UNIT) CAPS Take 400 Units by mouth daily.     fluticasone-salmeterol (WIXELA INHUB) 250-50  MCG/ACT AEPB Inhale 1 puff into the lungs in the morning and at bedtime. 180 each 3   lisinopril (ZESTRIL) 5 MG tablet Take 1 tablet (5 mg total) by mouth daily. 90 tablet 0   Multiple Vitamin (MULTIVITAMIN) capsule Take 1 capsule by mouth daily.     Multiple Vitamins-Minerals (THERA-M) TABS Take 1 tablet by mouth daily.     pantoprazole (PROTONIX) 40 MG tablet Take 40 mg by mouth daily.     senna-docusate (SENOKOT-S) 8.6-50 MG tablet Take 1 tablet by mouth  at bedtime as needed for mild constipation. 20 tablet 0   simvastatin (ZOCOR) 20 MG tablet Take 10 mg by mouth daily.     furosemide (LASIX) 40 MG tablet Take 1 tablet (40 mg total) by mouth daily for 7 days. 30 tablet 0   Tiotropium Bromide Monohydrate (SPIRIVA RESPIMAT) 2.5 MCG/ACT AERS Inhale 2 puffs into the lungs daily. 12 g 3   No facility-administered medications prior to visit.    Review of Systems  Constitutional:  Negative for chills, diaphoresis, fever, malaise/fatigue and weight loss.  HENT:  Negative for congestion.   Respiratory:  Positive for shortness of breath. Negative for cough, hemoptysis, sputum production and wheezing.   Cardiovascular:  Negative for chest pain, palpitations and leg swelling.     Objective:   Vitals:   07/24/22 1138  BP: 116/66  Pulse: 90  Temp: 97.9 F (36.6 C)  TempSrc: Oral  SpO2: 96%  Weight: 126 lb (57.2 kg)  Height: _0  (1.753 m)  SpO2: 96 % (3L cont) O2 Device: Nasal cannula O2 Flow Rate (L/min): 3 L/min O2 Type: Continuous O2  Physical Exam: General: Thin, well-appearing, no acute distress HENT: [place], AT Eyes: EOMI, no scleral icterus Respiratory: No breath sounds on left side. Diminished breath sounds auscultation on right.  No crackles, wheezing or rales Cardiovascular: RRR, -M/R/G, no JVD Extremities:-Edema,-tenderness Neuro: AAO x4, CNII-XII grossly intact Psych: Normal mood, normal affect   Data Reviewed:  Imaging: CXR 08/26/21 S/p left pneumonectomy. Complete opacification of left hemithorax. Pleural based calcifications. No infiltrate effusion or edema.  PFT: [facility] 10/31/21  FVC 1.17 (30%) FEV1 0.42 (14%) Ratio 36  TLC 62% DLCO 7.8% Interpretation: Very severe obstructive and restrictive defect with severely reduced gas exchange  Labs: CBC    Component Value Date/Time   WBC 4.3 09/15/2021 0246   RBC 3.39 (L) 09/15/2021 0246   HGB 9.5 (L) 09/15/2021 0246   HGB 12.2 (L) 09/26/2014 0431   HCT 31.0 (L)  09/15/2021 0246   HCT 36.9 (L) 09/26/2014 0431   PLT 218 09/15/2021 0246   PLT 199 09/26/2014 0431   MCV 91.4 09/15/2021 0246   MCV 88 09/26/2014 0431   MCH 28.0 09/15/2021 0246   MCHC 30.6 09/15/2021 0246   RDW 13.0 09/15/2021 0246   RDW 13.1 09/26/2014 0431   LYMPHSABS 0.9 09/05/2021 0926   LYMPHSABS 1.9 09/26/2014 0431   MONOABS 0.4 09/05/2021 0926   MONOABS 0.7 09/26/2014 0431   EOSABS 0.1 09/05/2021 0926   EOSABS 0.2 09/26/2014 0431   BASOSABS 0.0 09/05/2021 0926   BASOSABS 0.1 09/26/2014 0431   Absolute eosinophil  09/26/2014-200 09/05/2021-100  ABG    Component Value Date/Time   PHART 7.340 (L) 01/23/2022 1140   PCO2ART 71.3 (HH) 01/23/2022 1140   PO2ART 233 (H) 01/23/2022 1140   HCO3 37.4 (H) 01/23/2022 1140   TCO2 43 (H) 08/24/2021 1234   TCO2 45 (H) 08/24/2021 1234   O2SAT 99.4 01/23/2022 1140  SD Card Compliance Usage Days 09/15/21-11/13/21 - 53/59 days (89%) >4 hours - 85% Average Usage - 5 hours 21 min Average AHI 1.4    Assessment & Plan:   Discussion: 72 year old male with multiple co-morbidities including hx squamous cell ung cancer s/p left pneumonectomy with chronic pleural effusion, emphysema, moderate pulmonary hypertension WHO Group III who presents for follow-up. Has early acute bronchitis. Compliant with triple therapy and NIV ventilation. He is looking forward to pulmonary rehab.  Acute bronchitis --Supportive care including hydration, warm drinks and rest --If respiratory symptoms develop including wheezing, chest tightness, productive cough etc, take prednisone as directed  Chronic hypoxemic and hypercapneic respiratory failure  --SpO2 goal >88% --CONTINUE supplemental O2 with 3L on exertion. ORDER Inogen --CONTINUE non-invasive ventilation nightly  --[facility]. Demonstrated nocturnal hypoxemia --Contact [place] for TCO2 monitoring. Consider titration of nocturnal vent from TV 4-6 with increased PS max    ADDENDUM: No TCO2 monitoring at [facility]. Will contact [facility] at [facility] --DME: [employer] --[facility] to [facility]  Severe emphysema  --CONTINUE Spiriva 2.5 TWO puffs ONCE a day --CONTINUE Wixela 250 ONE puff TWICE a day --CONTINUE pulmonary rehab  Mild-moderate pulmonary hypertension, WHO group III --Not a candidate for selective pulmonary artery vasodilators  Health Maintenance Immunization History  Administered Date(s) Administered   Fluad Quad(high Dose 65+) 09/09/2021   Influenza, Seasonal, Injecte, Preservative Fre 03/15/2010   Influenza-Unspecified 08/23/2016, 09/09/2021   Tdap 09/27/2021   Zoster Recombinat (Shingrix) 09/27/2021, 01/03/2022   CT Lung Screen - not qualified  Orders Placed This Encounter  Procedures   Ambulatory Referral for DME    Referral Priority:   Routine    Referral Type:   Durable Medical Equipment Purchase    Number of Visits Requested:   1   Meds ordered this encounter  Medications   predniSONE (DELTASONE) 20 MG tablet    Sig: Take 1 tablet (20 mg total) by mouth daily with breakfast.    Dispense:  5 tablet    Refill:  0    Return in about 4 months (around 11/23/2022).  I have spent a total time of 33-minutes on the day of the appointment including chart review, data review, collecting history, coordinating care and discussing medical diagnosis and plan with the patient/family. Past medical history, allergies, medications were reviewed. Pertinent imaging, labs and tests included in this note have been reviewed and interpreted independently by me.  [place], MD [address] [facility] 07/24/2022 12:18 PM  Office Number [phone]

## 2022-07-24 NOTE — Patient Instructions (Addendum)
Acute bronchitis --Supportive care including hydration, warm drinks and rest --If respiratory symptoms develop including wheezing, chest tightness, productive cough etc, take prednisone as directed  Chronic hypoxemic and hypercapneic respiratory failure  --SpO2 goal >88% --CONTINUE supplemental O2 with 3L on exertion. ORDER Inogen --CONTINUE non-invasive ventilation nightly  --[facility] sleep study negative for OSA. Demonstrated nocturnal hypoxemia --Contact [place] for TCO2 monitoring. Consider titration of nocturnal vent from TV 4-6 with increased PS max --DME: [employer] --[facility] to [facility]. Provided business card  Severe emphysema  --CONTINUE Spiriva 2.5 TWO puffs ONCE a day --CONTINUE Wixela 250 ONE puff TWICE a day --CONTINUE pulmonary rehab  Follow-up with me in 4 months

## 2022-08-08 ENCOUNTER — Ambulatory Visit: Payer: Medicare Other

## 2022-08-22 ENCOUNTER — Encounter: Payer: No Typology Code available for payment source | Attending: Nurse Practitioner

## 2022-08-22 VITALS — Ht 68.0 in | Wt 121.8 lb

## 2022-08-22 DIAGNOSIS — J449 Chronic obstructive pulmonary disease, unspecified: Secondary | ICD-10-CM | POA: Diagnosis present

## 2022-08-22 NOTE — Progress Notes (Signed)
Pulmonary Individual Treatment Plan  Patient Details  Name: [name] [name] MRN: [id number] Date of Birth: February 28, 1951 Referring Provider:   Flowsheet Row Pulmonary Rehab from 08/22/2022 in [facility] Cardiac and Pulmonary Rehab  Referring Provider Grier Rocher MD       Initial Encounter Date:  Flowsheet Row Pulmonary Rehab from 08/22/2022 in [facility] Cardiac and Pulmonary Rehab  Date 08/22/22       Visit Diagnosis: Chronic obstructive pulmonary disease, unspecified COPD type ([place])  Patient's Home Medications on Admission:  Current Outpatient Medications:    acetaminophen (TYLENOL) 325 MG tablet, Take 650 mg by mouth every 6 (six) hours as needed for headache or mild pain., Disp: , Rfl:    albuterol (VENTOLIN HFA) 108 (90 Base) MCG/ACT inhaler, Inhale 1-2 puffs into the lungs every 6 (six) hours as needed., Disp: , Rfl:    ascorbic acid (VITAMIN C) 1000 MG tablet, Take 1,000 mg by mouth daily., Disp: , Rfl:    aspirin 81 MG EC tablet, Take 81 mg by mouth daily., Disp: , Rfl:    carvedilol (COREG) 3.125 MG tablet, Take 1 tablet (3.125 mg total) by mouth 2 (two) times daily., Disp: 60 tablet, Rfl: 0   Cholecalciferol 10 MCG (400 UNIT) CAPS, Take 400 Units by mouth daily., Disp: , Rfl:    fluticasone-salmeterol (WIXELA INHUB) 250-50 MCG/ACT AEPB, Inhale 1 puff into the lungs in the morning and at bedtime., Disp: 180 each, Rfl: 3   furosemide (LASIX) 40 MG tablet, Take 1 tablet (40 mg total) by mouth daily for 7 days., Disp: 30 tablet, Rfl: 0   lisinopril (ZESTRIL) 5 MG tablet, Take 1 tablet (5 mg total) by mouth daily., Disp: 90 tablet, Rfl: 0   Multiple Vitamin (MULTIVITAMIN) capsule, Take 1 capsule by mouth daily., Disp: , Rfl:    Multiple Vitamins-Minerals (THERA-M) TABS, Take 1 tablet by mouth daily., Disp: , Rfl:    pantoprazole (PROTONIX) 40 MG tablet, Take 40 mg by mouth daily., Disp: , Rfl:    predniSONE (DELTASONE) 20 MG tablet, Take 1 tablet (20 mg total) by mouth daily with breakfast.,  Disp: 5 tablet, Rfl: 0   senna-docusate (SENOKOT-S) 8.6-50 MG tablet, Take 1 tablet by mouth at bedtime as needed for mild constipation., Disp: 20 tablet, Rfl: 0   simvastatin (ZOCOR) 20 MG tablet, Take 10 mg by mouth daily., Disp: , Rfl:    Tiotropium Bromide Monohydrate (SPIRIVA RESPIMAT) 2.5 MCG/ACT AERS, Inhale 2 puffs into the lungs daily., Disp: 12 g, Rfl: 3  Past Medical History: Past Medical History:  Diagnosis Date   Cancer ([place])    lung   CHF (congestive heart failure) (HCC)    Diabetes mellitus without complication ([place])    Dyspnea    Hyperlipidemia    Hypertension     Tobacco Use: Social History   Tobacco Use  Smoking Status Former  Smokeless Tobacco Never    Labs: Review Flowsheet  More data exists      Latest Ref Rng & Units 08/21/2021 08/22/2021 08/23/2021 08/24/2021 01/23/2022  Labs for ITP Cardiac and Pulmonary Rehab  Hemoglobin A1c 4.8 - 5.6 % - - 5.5  - -  PH, Arterial 7.350 - 7.450 - 7.365  7.392  7.346  7.270  7.378  - 7.340   PCO2 arterial 32.0 - 48.0 mmHg - 78.1  74.5  86.7  104.5  76.1  - 71.3   Bicarbonate 20.0 - 28.0 mmol/L - 44.3  45.3  47.4  47.9  45.0  40.9  42.4  37.4   TCO2 22 - 32 mmol/L 22 - 32 mmol/L - 47  47  50  >50  47  43  45  -  O2 Saturation % 84.7  94.3  94.0  95.0  96.0  99.0  99.0  68.0  66.0  99.4      Pulmonary Assessment Scores:  Pulmonary Assessment Scores     Row Name 08/22/22 1541         ADL UCSD   ADL Phase Entry     SOB Score total 56     Rest 0     Walk 2     Stairs 4     Bath 3     Dress 1     Shop 3       CAT Score   CAT Score 10       mMRC Score   mMRC Score 2              UCSD: Self-administered rating of dyspnea associated with activities of daily living (ADLs) 6-point scale (0 = "not at all" to 5 = "maximal or unable to do because of breathlessness")  Scoring Scores range from 0 to 120.  Minimally important difference is 5 units  CAT: CAT can identify the health impairment of COPD patients and  is better correlated with disease progression.  CAT has a scoring range of zero to 40. The CAT score is classified into four groups of low (less than 10), medium (10 - 20), high (21-30) and very high (31-40) based on the impact level of disease on health status. A CAT score over 10 suggests significant symptoms.  A worsening CAT score could be explained by an exacerbation, poor medication adherence, poor inhaler technique, or progression of COPD or comorbid conditions.  CAT MCID is 2 points  mMRC: mMRC (Modified Medical Research Council) Dyspnea Scale is used to assess the degree of baseline functional disability in patients of respiratory disease due to dyspnea. No minimal important difference is established. A decrease in score of 1 point or greater is considered a positive change.   Pulmonary Function Assessment:   Exercise Target Goals: Exercise Program Goal: Individual exercise prescription set using results from initial 6 min walk test and THRR while considering  patient's activity barriers and safety.   Exercise Prescription Goal: Initial exercise prescription builds to 30-45 minutes a day of aerobic activity, 2-3 days per week.  Home exercise guidelines will be given to patient during program as part of exercise prescription that the participant will acknowledge.  Education: Aerobic Exercise: - Group verbal and visual presentation on the components of exercise prescription. Introduces F.I.T.T principle from ACSM for exercise prescriptions.  Reviews F.I.T.T. principles of aerobic exercise including progression. Written material given at graduation. Flowsheet Row Pulmonary Rehab from 08/22/2022 in [facility] Cardiac and Pulmonary Rehab  Education need identified 08/22/22       Education: Resistance Exercise: - Group verbal and visual presentation on the components of exercise prescription. Introduces F.I.T.T principle from ACSM for exercise prescriptions  Reviews F.I.T.T. principles of  resistance exercise including progression. Written material given at graduation.    Education: Exercise & Equipment Safety: - Individual verbal instruction and demonstration of equipment use and safety with use of the equipment. Flowsheet Row Pulmonary Rehab from 08/22/2022 in [facility] Cardiac and Pulmonary Rehab  Date 08/22/22  Educator NT  Instruction Review Code 1- Verbalizes Understanding       Education: Exercise Physiology & General Exercise Guidelines: -  Group verbal and written instruction with models to review the exercise physiology of the cardiovascular system and associated critical values. Provides general exercise guidelines with specific guidelines to those with heart or lung disease.    Education: Flexibility, Balance, Mind/Body Relaxation: - Group verbal and visual presentation with interactive activity on the components of exercise prescription. Introduces F.I.T.T principle from ACSM for exercise prescriptions. Reviews F.I.T.T. principles of flexibility and balance exercise training including progression. Also discusses the mind body connection.  Reviews various relaxation techniques to help reduce and manage stress (i.e. Deep breathing, progressive muscle relaxation, and visualization). Balance handout provided to take home. Written material given at graduation.   Activity Barriers & Risk Stratification:  Activity Barriers & Cardiac Risk Stratification - 08/22/22 1510       Activity Barriers & Cardiac Risk Stratification   Activity Barriers Joint Problems;Deconditioning;Muscular Weakness;Shortness of Breath             6 Minute Walk:  6 Minute Walk     Row Name 08/22/22 1506         6 Minute Walk   Phase Initial     Distance 455 feet     Walk Time 2.92 minutes     # of Rest Breaks 1     MPH 1.77     METS 2.03     RPE 11     Perceived Dyspnea  2     VO2 Peak 7.1     Symptoms Yes (comment)     Comments SOB     Resting HR 86 bpm     Resting BP 112/64      Resting Oxygen Saturation  93 %     Exercise Oxygen Saturation  during 6 min walk 79 %     Max Ex. HR 89 bpm     Max Ex. BP 138/80     2 Minute Post BP 126/78       Interval HR   1 Minute HR 86     2 Minute HR 87     3 Minute HR 88     4 Minute HR 89  Stopped at 3:17     2 Minute Post HR 74     Interval Heart Rate? Yes       Interval Oxygen   Interval Oxygen? Yes     Baseline Oxygen Saturation % 93 %     1 Minute Oxygen Saturation % 92 %     1 Minute Liters of Oxygen 3 L     2 Minute Oxygen Saturation % 84 %     2 Minute Liters of Oxygen 3 L     3 Minute Oxygen Saturation % 82 %     3 Minute Liters of Oxygen 3 L     4 Minute Oxygen Saturation % 79 %  Stopped at 3:17     4 Minute Liters of Oxygen 3 L     2 Minute Post Oxygen Saturation % 99 %     2 Minute Post Liters of Oxygen 3 L             Oxygen Initial Assessment:  Oxygen Initial Assessment - 08/22/22 1543       Home Oxygen   Home Oxygen Device E-Tanks    Sleep Oxygen Prescription Continuous    Liters per minute 3    Home Exercise Oxygen Prescription Continuous    Liters per minute 3    Home Resting Oxygen Prescription Continuous  Liters per minute 3    Compliance with Home Oxygen Use Yes      Intervention   Short Term Goals To learn and exhibit compliance with exercise, home and travel O2 prescription;To learn and understand importance of monitoring SPO2 with pulse oximeter and demonstrate accurate use of the pulse oximeter.;To learn and understand importance of maintaining oxygen saturations>88%;To learn and demonstrate proper pursed lip breathing techniques or other breathing techniques. ;To learn and demonstrate proper use of respiratory medications    Long  Term Goals Exhibits compliance with exercise, home  and travel O2 prescription;Verbalizes importance of monitoring SPO2 with pulse oximeter and return demonstration;Maintenance of O2 saturations>88%;Exhibits proper breathing techniques, such as pursed  lip breathing or other method taught during program session;Compliance with respiratory medication;Demonstrates proper use of MDI's             Oxygen Re-Evaluation:   Oxygen Discharge (Final Oxygen Re-Evaluation):   Initial Exercise Prescription:  Initial Exercise Prescription - 08/22/22 1500       Date of Initial Exercise RX and Referring Provider   Date 08/22/22    Referring Provider Grier Rocher MD      Oxygen   Oxygen Continuous    Liters 3    Maintain Oxygen Saturation 88% or higher      Treadmill   MPH 1.6    Grade 0    Minutes 15    METs 2.23      Recumbant Bike   Level 1    RPM 50    Minutes 15    METs 2.03      NuStep   Level 1    SPM 80    Minutes 15    METs 2.03      Biostep-RELP   Level 1    SPM 50    Minutes 15    METs 2.03      Track   Laps 20    Minutes 15    METs 2.09      Prescription Details   Frequency (times per week) 3    Duration Progress to 30 minutes of continuous aerobic without signs/symptoms of physical distress      Intensity   THRR 40-80% of Max Heartrate 112-138    Ratings of Perceived Exertion 11-13    Perceived Dyspnea 0-4      Progression   Progression Continue to progress workloads to maintain intensity without signs/symptoms of physical distress.      Resistance Training   Training Prescription Yes    Weight 5 lb    Reps 10-15             Perform Capillary Blood Glucose checks as needed.  Exercise Prescription Changes:   Exercise Prescription Changes     Row Name 08/22/22 1500             Response to Exercise   Blood Pressure (Admit) 112/64       Blood Pressure (Exercise) 138/80       Blood Pressure (Exit) 126/78       Heart Rate (Admit) 86 bpm       Heart Rate (Exercise) 89 bpm       Heart Rate (Exit) 74 bpm       Oxygen Saturation (Admit) 93 %       Oxygen Saturation (Exercise) 79 %       Oxygen Saturation (Exit) 99 %       Rating of Perceived Exertion (Exercise) 11  Perceived Dyspnea (Exercise) 2       Symptoms SOB       Comments 6MWT Results       Duration Progress to 30 minutes of  aerobic without signs/symptoms of physical distress       Intensity THRR unchanged                Exercise Comments:   Exercise Goals and Review:   Exercise Goals     Row Name 08/22/22 1529             Exercise Goals   Increase Physical Activity Yes       Intervention Develop an individualized exercise prescription for aerobic and resistive training based on initial evaluation findings, risk stratification, comorbidities and participant's personal goals.;Provide advice, education, support and counseling about physical activity/exercise needs.       Expected Outcomes Short Term: Attend rehab on a regular basis to increase amount of physical activity.;Long Term: Add in home exercise to make exercise part of routine and to increase amount of physical activity.;Long Term: Exercising regularly at least 3-5 days a week.       Increase Strength and Stamina Yes       Intervention Provide advice, education, support and counseling about physical activity/exercise needs.;Develop an individualized exercise prescription for aerobic and resistive training based on initial evaluation findings, risk stratification, comorbidities and participant's personal goals.       Expected Outcomes Short Term: Increase workloads from initial exercise prescription for resistance, speed, and METs.;Short Term: Perform resistance training exercises routinely during rehab and add in resistance training at home;Long Term: Improve cardiorespiratory fitness, muscular endurance and strength as measured by increased METs and functional capacity (6MWT)       Able to understand and use rate of perceived exertion (RPE) scale Yes       Intervention Provide education and explanation on how to use RPE scale       Expected Outcomes Short Term: Able to use RPE daily in rehab to express subjective intensity  level;Long Term:  Able to use RPE to guide intensity level when exercising independently       Able to understand and use Dyspnea scale Yes       Intervention Provide education and explanation on how to use Dyspnea scale       Expected Outcomes Short Term: Able to use Dyspnea scale daily in rehab to express subjective sense of shortness of breath during exertion;Long Term: Able to use Dyspnea scale to guide intensity level when exercising independently       Knowledge and understanding of Target Heart Rate Range (THRR) Yes       Intervention Provide education and explanation of THRR including how the numbers were predicted and where they are located for reference       Expected Outcomes Short Term: Able to state/look up THRR;Short Term: Able to use daily as guideline for intensity in rehab;Long Term: Able to use THRR to govern intensity when exercising independently       Able to check pulse independently Yes       Intervention Provide education and demonstration on how to check pulse in carotid and radial arteries.;Review the importance of being able to check your own pulse for safety during independent exercise       Expected Outcomes Short Term: Able to explain why pulse checking is important during independent exercise;Long Term: Able to check pulse independently and accurately       Understanding of  Exercise Prescription Yes       Intervention Provide education, explanation, and written materials on patient's individual exercise prescription       Expected Outcomes Short Term: Able to explain program exercise prescription;Long Term: Able to explain home exercise prescription to exercise independently       Improve claudication pain toleration; Improve walking ability --       Intervention --       Expected Outcomes --                Exercise Goals Re-Evaluation :   Discharge Exercise Prescription (Final Exercise Prescription Changes):  Exercise Prescription Changes - 08/22/22 1500        Response to Exercise   Blood Pressure (Admit) 112/64    Blood Pressure (Exercise) 138/80    Blood Pressure (Exit) 126/78    Heart Rate (Admit) 86 bpm    Heart Rate (Exercise) 89 bpm    Heart Rate (Exit) 74 bpm    Oxygen Saturation (Admit) 93 %    Oxygen Saturation (Exercise) 79 %    Oxygen Saturation (Exit) 99 %    Rating of Perceived Exertion (Exercise) 11    Perceived Dyspnea (Exercise) 2    Symptoms SOB    Comments 6MWT Results    Duration Progress to 30 minutes of  aerobic without signs/symptoms of physical distress    Intensity THRR unchanged             Nutrition:  Target Goals: Understanding of nutrition guidelines, daily intake of sodium <1567m, cholesterol <202m calories 30% from fat and 7% or less from saturated fats, daily to have 5 or more servings of fruits and vegetables.  Education: All About Nutrition: -Group instruction provided by verbal, written material, interactive activities, discussions, models, and posters to present general guidelines for heart healthy nutrition including fat, fiber, MyPlate, the role of sodium in heart healthy nutrition, utilization of the nutrition label, and utilization of this knowledge for meal planning. Follow up email sent as well. Written material given at graduation. [facility]  Education need identified 08/22/22       Biometrics:  Pre Biometrics - 08/22/22 1530       Pre Biometrics   Height _0  (1.727 m)    Weight 121 lb 12.8 oz (55.2 kg)    Waist Circumference 28.5 inches    Hip Circumference 35 inches    Waist to Hip Ratio 0.81 %    BMI (Calculated) 18.52              Nutrition Therapy Plan and Nutrition Goals:   Nutrition Assessments:  MEDIFICTS Score Key: ?70 Need to make dietary changes  40-70 Heart Healthy Diet ? 40 Therapeutic Level Cholesterol Diet  [facility]  Picture Your Plate Total Score on Admission 40      Picture Your Plate Scores: <4<44nhealthy dietary pattern with much room for improvement. 41-50 Dietary pattern unlikely to meet recommendations for good health and room for improvement. 51-60 More healthful dietary pattern, with some room for improvement.  >60 Healthy dietary pattern, although there may be some specific behaviors that could be improved.   Nutrition Goals Re-Evaluation:   Nutrition Goals Discharge (Final Nutrition Goals Re-Evaluation):   Psychosocial: Target Goals: Acknowledge presence or absence of significant depression and/or stress, maximize coping skills, provide positive support system. Participant is able to verbalize  types and ability to use techniques and skills needed for reducing stress and depression.   Education: Stress, Anxiety, and Depression - Group verbal and visual presentation to define topics covered.  Reviews how body is impacted by stress, anxiety, and depression.  Also discusses healthy ways to reduce stress and to treat/manage anxiety and depression.  Written material given at graduation.   Education: Sleep Hygiene -Provides group verbal and written instruction about how sleep can affect your health.  Define sleep hygiene, discuss sleep cycles and impact of sleep habits. Review good sleep hygiene tips.    Initial Review & Psychosocial Screening:  Initial Psych Review & Screening - 07/21/22 1413       Initial Review   Current issues with Current Stress Concerns    Source of Stress Concerns Chronic Illness;Unable to participate in former interests or hobbies      [place]? Yes   daughter, friend, family     Barriers   Psychosocial barriers to participate in program There are no identifiable barriers or psychosocial needs.;The patient should benefit from training in stress management and relaxation.      Screening Interventions    Interventions Encouraged to exercise;To provide support and resources with identified psychosocial needs    Expected Outcomes Short Term goal: Utilizing psychosocial counselor, staff and physician to assist with identification of specific Stressors or current issues interfering with healing process. Setting desired goal for each stressor or current issue identified.;Long Term Goal: Stressors or current issues are controlled or eliminated.;Short Term goal: Identification and review with participant of any Quality of Life or Depression concerns found by scoring the questionnaire.;Long Term goal: The participant improves quality of Life and PHQ9 Scores as seen by post scores and/or verbalization of changes             Quality of Life Scores:  Scores of 19 and below usually indicate a poorer quality of life in these areas.  A difference of  2-3 points is a clinically meaningful difference.  A difference of 2-3 points in the total score of the Quality of Life Index has been associated with significant improvement in overall quality of life, self-image, physical symptoms, and general health in studies assessing change in quality of life.  PHQ-9: Review Flowsheet       08/22/2022  Depression screen PHQ 2/9  Decreased Interest 0  Down, Depressed, Hopeless 0  PHQ - 2 Score 0  Altered sleeping 1  Tired, decreased energy 1  Change in appetite 0  Feeling bad or failure about yourself  0  Trouble concentrating 0  Moving slowly or fidgety/restless 0  Suicidal thoughts 0  PHQ-9 Score 2  Difficult doing work/chores Not difficult at all   Interpretation of Total Score  Total Score Depression Severity:  1-4 = Minimal depression, 5-9 = Mild depression, 10-14 = Moderate depression, 15-19 = Moderately severe depression, 20-27 = Severe depression   Psychosocial Evaluation and Intervention:  Psychosocial Evaluation - 07/21/22 1426       Psychosocial Evaluation & Interventions   Interventions  Encouraged to exercise with the program and follow exercise prescription;Stress management education;Relaxation education    Comments Ebubechukwu is coming to pulmonary rehab for COPD. He reports getting really sick in September of 2022. He used to exercise before then and wants to get his strength back. He reports possible PTSD from when he was in the service and he is investigating that with this doctor. When asked about his symptoms/triggers  he stated he can get startled easily and mainly he has a hard time with being patient. He notes that his family stress is a lot right now and is contributing to his mental health. He wants to get involved in the program to help with his stamina and overall feeling better    Expected Outcomes Short: attend pulmonary rehab for education and exercise. Long: develop and maintain positive self care habits.    Continue Psychosocial Services  Follow up required by staff             Psychosocial Re-Evaluation:   Psychosocial Discharge (Final Psychosocial Re-Evaluation):   Education: Education Goals: Education classes will be provided on a weekly basis, covering required topics. Participant will state understanding/return demonstration of topics presented.  Learning Barriers/Preferences:  Learning Barriers/Preferences - 07/21/22 1413       Learning Barriers/Preferences   Learning Barriers None    Learning Preferences Individual Instruction             General Pulmonary Education Topics:  Infection Prevention: - Provides verbal and written material to individual with discussion of infection control including proper hand washing and proper equipment cleaning during exercise session. Flowsheet Row Pulmonary Rehab from 08/22/2022 in [facility] Cardiac and Pulmonary Rehab  Date 08/22/22  Educator NT  Instruction Review Code 1- Verbalizes Understanding       Falls Prevention: - Provides verbal and written material to individual with discussion of falls  prevention and safety. Flowsheet Row Pulmonary Rehab from 08/22/2022 in [facility] Cardiac and Pulmonary Rehab  Date 08/22/22  Educator NT  Instruction Review Code 1- Verbalizes Understanding       Chronic Lung Disease Review: - Group verbal instruction with posters, models, PowerPoint presentations and videos,  to review new updates, new respiratory medications, new advancements in procedures and treatments. Providing information on websites and "800" numbers for continued self-education. Includes information about supplement oxygen, available portable oxygen systems, continuous and intermittent flow rates, oxygen safety, concentrators, and Medicare reimbursement for oxygen. Explanation of Pulmonary Drugs, including class, frequency, complications, importance of spacers, rinsing mouth after steroid MDI's, and proper cleaning methods for nebulizers. Review of basic lung anatomy and physiology related to function, structure, and complications of lung disease. Review of risk factors. Discussion about methods for diagnosing sleep apnea and types of masks and machines for OSA. Includes a review of the use of types of environmental controls: home humidity, furnaces, filters, dust mite/pet prevention, HEPA vacuums. Discussion about weather changes, air quality and the benefits of nasal washing. Instruction on Warning signs, infection symptoms, calling MD promptly, preventive modes, and value of vaccinations. Review of effective airway clearance, coughing and/or vibration techniques. Emphasizing that all should Create an Action Plan. Written material given at graduation. Flowsheet Row Pulmonary Rehab from 08/22/2022 in [facility] Cardiac and Pulmonary Rehab  Education need identified 08/22/22       AED/CPR: - Group verbal and written instruction with the use of models to demonstrate the basic use of the AED with the basic ABC's of resuscitation.    Anatomy and Cardiac Procedures: - Group verbal and visual  presentation and models provide information about basic cardiac anatomy and function. Reviews the testing methods done to diagnose heart disease and the outcomes of the test results. Describes the treatment choices: Medical Management, Angioplasty, or Coronary Bypass Surgery for treating various heart conditions including Myocardial Infarction, Angina, Valve Disease, and Cardiac Arrhythmias.  Written material given at graduation. Flowsheet Row Pulmonary Rehab from 08/22/2022 in [facility] Cardiac and  Pulmonary Rehab  Education need identified 08/22/22       Medication Safety: - Group verbal and visual instruction to review commonly prescribed medications for heart and lung disease. Reviews the medication, class of the drug, and side effects. Includes the steps to properly store meds and maintain the prescription regimen.  Written material given at graduation. Flowsheet Row Pulmonary Rehab from 08/22/2022 in [facility] Cardiac and Pulmonary Rehab  Education need identified 08/22/22       Other: -Provides group and verbal instruction on various topics (see comments)   Knowledge Questionnaire Score:  Knowledge Questionnaire Score - 08/22/22 1541       Knowledge Questionnaire Score   Pre Score 12/18              Core Components/Risk Factors/Patient Goals at Admission:  Personal Goals and Risk Factors at Admission - 08/22/22 1544       Core Components/Risk Factors/Patient Goals on Admission    Weight Management Weight Gain;Yes    Intervention Weight Management: Develop a combined nutrition and exercise program designed to reach desired caloric intake, while maintaining appropriate intake of nutrient and fiber, sodium and fats, and appropriate energy expenditure required for the weight goal.;Weight Management: Provide education and appropriate resources to help participant work on and attain dietary goals.    Admit Weight 121 lb 12.8 oz (55.2 kg)    Goal Weight: Short Term 125 lb (56.7 kg)    Goal  Weight: Long Term 130 lb (59 kg)    Expected Outcomes Short Term: Continue to assess and modify interventions until short term weight is achieved;Long Term: Adherence to nutrition and physical activity/exercise program aimed toward attainment of established weight goal;Weight Maintenance: Understanding of the daily nutrition guidelines, which includes 25-35% calories from fat, 7% or less cal from saturated fats, less than 223m cholesterol, less than 1.5gm of sodium, & 5 or more servings of fruits and vegetables daily;Weight Gain: Understanding of general recommendations for a high calorie, high protein meal plan that promotes weight gain by distributing calorie intake throughout the day with the consumption for 4-5 meals, snacks, and/or supplements;Understanding of distribution of calorie intake throughout the day with the consumption of 4-5 meals/snacks;Understanding recommendations for meals to include 15-35% energy as protein, 25-35% energy from fat, 35-60% energy from carbohydrates, less than 2035mof dietary cholesterol, 20-35 gm of total fiber daily    Improve shortness of breath with ADL's Yes    Intervention Provide education, individualized exercise plan and daily activity instruction to help decrease symptoms of SOB with activities of daily living.    Expected Outcomes Short Term: Improve cardiorespiratory fitness to achieve a reduction of symptoms when performing ADLs;Long Term: Be able to perform more ADLs without symptoms or delay the onset of symptoms    Heart Failure Yes    Intervention Provide a combined exercise and nutrition program that is supplemented with education, support and counseling about heart failure. Directed toward relieving symptoms such as shortness of breath, decreased exercise tolerance, and extremity edema.    Expected Outcomes Improve functional capacity of life;Short term: Attendance in program 2-3 days a week with increased exercise capacity. Reported lower sodium  intake. Reported increased fruit and vegetable intake. Reports medication compliance.;Short term: Daily weights obtained and reported for increase. Utilizing diuretic protocols set by physician.;Long term: Adoption of self-care skills and reduction of barriers for early signs and symptoms recognition and intervention leading to self-care maintenance.    Hypertension Yes    Intervention Provide education on lifestyle  modifcations including regular physical activity/exercise, weight management, moderate sodium restriction and increased consumption of fresh fruit, vegetables, and low fat dairy, alcohol moderation, and smoking cessation.;Monitor prescription use compliance.    Expected Outcomes Short Term: Continued assessment and intervention until BP is < 140/65m HG in hypertensive participants. < 130/869mHG in hypertensive participants with diabetes, heart failure or chronic kidney disease.;Long Term: Maintenance of blood pressure at goal levels.    Lipids Yes    Intervention Provide education and support for participant on nutrition & aerobic/resistive exercise along with prescribed medications to achieve LDL <7054mHDL >25m16m  Expected Outcomes Short Term: Participant states understanding of desired cholesterol values and is compliant with medications prescribed. Participant is following exercise prescription and nutrition guidelines.;Long Term: Cholesterol controlled with medications as prescribed, with individualized exercise RX and with personalized nutrition plan. Value goals: LDL < 70mg70mL > 40 mg.             Education:Diabetes - Individual verbal and written instruction to review signs/symptoms of diabetes, desired ranges of glucose level fasting, after meals and with exercise. Acknowledge that pre and post exercise glucose checks will be done for 3 sessions at entry of program.   Know Your Numbers and Heart Failure: - Group verbal and visual instruction to discuss disease risk  factors for cardiac and pulmonary disease and treatment options.  Reviews associated critical values for Overweight/Obesity, Hypertension, Cholesterol, and Diabetes.  Discusses basics of heart failure: signs/symptoms and treatments.  Introduces Heart Failure Zone chart for action plan for heart failure.  Written material given at graduation.   Core Components/Risk Factors/Patient Goals Review:    Core Components/Risk Factors/Patient Goals at Discharge (Final Review):    ITP Comments:  ITP Comments     Row Name 07/21/22 1421 08/22/22 1458         ITP Comments Initial telephone orientation completed. [facility]. EP orientation scheduled for Tuesday 8/22 at 10am. Completed 6MWT and gym orientation. Initial ITP created and sent for review to Dr. Fuad Ottie Glazierical Director.               Comments: Initial ITP

## 2022-08-22 NOTE — Patient Instructions (Signed)
Patient Instructions  Patient Details  Name: [name] [name] MRN: [id number] Date of Birth: June 07, 1951 Referring Provider:  Luella Cook, NP  Below are your personal goals for exercise, nutrition, and risk factors. Our goal is to help you stay on track towards obtaining and maintaining these goals. We will be discussing your progress on these goals with you throughout the program.  Initial Exercise Prescription:  Initial Exercise Prescription - 08/22/22 1500       Date of Initial Exercise RX and Referring Provider   Date 08/22/22    Referring Provider Grier Rocher MD      Oxygen   Oxygen Continuous    Liters 3    Maintain Oxygen Saturation 88% or higher      Treadmill   MPH 1.6    Grade 0    Minutes 15    METs 2.23      Recumbant Bike   Level 1    RPM 50    Minutes 15    METs 2.03      NuStep   Level 1    SPM 80    Minutes 15    METs 2.03      Biostep-RELP   Level 1    SPM 50    Minutes 15    METs 2.03      Track   Laps 20    Minutes 15    METs 2.09      Prescription Details   Frequency (times per week) 3    Duration Progress to 30 minutes of continuous aerobic without signs/symptoms of physical distress      Intensity   THRR 40-80% of Max Heartrate 112-138    Ratings of Perceived Exertion 11-13    Perceived Dyspnea 0-4      Progression   Progression Continue to progress workloads to maintain intensity without signs/symptoms of physical distress.      Resistance Training   Training Prescription Yes    Weight 5 lb    Reps 10-15             Exercise Goals: Frequency: Be able to perform aerobic exercise two to three times per week in program working toward 2-5 days per week of home exercise.  Intensity: Work with a perceived exertion of 11 (fairly light) - 15 (hard) while following your exercise prescription.  We will make changes to your prescription with you as you progress through the program.   Duration: Be able to do 30 to 45  minutes of continuous aerobic exercise in addition to a 5 minute warm-up and a 5 minute cool-down routine.   Nutrition Goals: Your personal nutrition goals will be established when you do your nutrition analysis with the dietician.  The following are general nutrition guidelines to follow: Cholesterol < 258m/day Sodium < 15065mday Fiber: Men over 50 yrs - 30 grams per day  Personal Goals:  Personal Goals and Risk Factors at Admission - 08/22/22 1544       Core Components/Risk Factors/Patient Goals on Admission    Weight Management Weight Gain;Yes    Intervention Weight Management: Develop a combined nutrition and exercise program designed to reach desired caloric intake, while maintaining appropriate intake of nutrient and fiber, sodium and fats, and appropriate energy expenditure required for the weight goal.;Weight Management: Provide education and appropriate resources to help participant work on and attain dietary goals.    Admit Weight 121 lb 12.8 oz (55.2 kg)    Goal Weight: Short Term 125  lb (56.7 kg)    Goal Weight: Long Term 130 lb (59 kg)    Expected Outcomes Short Term: Continue to assess and modify interventions until short term weight is achieved;Long Term: Adherence to nutrition and physical activity/exercise program aimed toward attainment of established weight goal;Weight Maintenance: Understanding of the daily nutrition guidelines, which includes 25-35% calories from fat, 7% or less cal from saturated fats, less than 261m cholesterol, less than 1.5gm of sodium, & 5 or more servings of fruits and vegetables daily;Weight Gain: Understanding of general recommendations for a high calorie, high protein meal plan that promotes weight gain by distributing calorie intake throughout the day with the consumption for 4-5 meals, snacks, and/or supplements;Understanding of distribution of calorie intake throughout the day with the consumption of 4-5 meals/snacks;Understanding  recommendations for meals to include 15-35% energy as protein, 25-35% energy from fat, 35-60% energy from carbohydrates, less than 2021mof dietary cholesterol, 20-35 gm of total fiber daily    Improve shortness of breath with ADL's Yes    Intervention Provide education, individualized exercise plan and daily activity instruction to help decrease symptoms of SOB with activities of daily living.    Expected Outcomes Short Term: Improve cardiorespiratory fitness to achieve a reduction of symptoms when performing ADLs;Long Term: Be able to perform more ADLs without symptoms or delay the onset of symptoms    Heart Failure Yes    Intervention Provide a combined exercise and nutrition program that is supplemented with education, support and counseling about heart failure. Directed toward relieving symptoms such as shortness of breath, decreased exercise tolerance, and extremity edema.    Expected Outcomes Improve functional capacity of life;Short term: Attendance in program 2-3 days a week with increased exercise capacity. Reported lower sodium intake. Reported increased fruit and vegetable intake. Reports medication compliance.;Short term: Daily weights obtained and reported for increase. Utilizing diuretic protocols set by physician.;Long term: Adoption of self-care skills and reduction of barriers for early signs and symptoms recognition and intervention leading to self-care maintenance.    Hypertension Yes    Intervention Provide education on lifestyle modifcations including regular physical activity/exercise, weight management, moderate sodium restriction and increased consumption of fresh fruit, vegetables, and low fat dairy, alcohol moderation, and smoking cessation.;Monitor prescription use compliance.    Expected Outcomes Short Term: Continued assessment and intervention until BP is < 140/9027mG in hypertensive participants. < 130/82m28m in hypertensive participants with diabetes, heart failure or  chronic kidney disease.;Long Term: Maintenance of blood pressure at goal levels.    Lipids Yes    Intervention Provide education and support for participant on nutrition & aerobic/resistive exercise along with prescribed medications to achieve LDL <70mg20mL >40mg.12mExpected Outcomes Short Term: Participant states understanding of desired cholesterol values and is compliant with medications prescribed. Participant is following exercise prescription and nutrition guidelines.;Long Term: Cholesterol controlled with medications as prescribed, with individualized exercise RX and with personalized nutrition plan. Value goals: LDL < 70mg, 3m> 40 mg.             Tobacco Use Initial Evaluation: Social History   Tobacco Use  Smoking Status Former  Smokeless Tobacco Never    Exercise Goals and Review:  Exercise Goals     Row Name 08/22/22 1529             Exercise Goals   Increase Physical Activity Yes       Intervention Develop an individualized exercise prescription for aerobic and resistive training based on  initial evaluation findings, risk stratification, comorbidities and participant's personal goals.;Provide advice, education, support and counseling about physical activity/exercise needs.       Expected Outcomes Short Term: Attend rehab on a regular basis to increase amount of physical activity.;Long Term: Add in home exercise to make exercise part of routine and to increase amount of physical activity.;Long Term: Exercising regularly at least 3-5 days a week.       Increase Strength and Stamina Yes       Intervention Provide advice, education, support and counseling about physical activity/exercise needs.;Develop an individualized exercise prescription for aerobic and resistive training based on initial evaluation findings, risk stratification, comorbidities and participant's personal goals.       Expected Outcomes Short Term: Increase workloads from initial exercise prescription  for resistance, speed, and METs.;Short Term: Perform resistance training exercises routinely during rehab and add in resistance training at home;Long Term: Improve cardiorespiratory fitness, muscular endurance and strength as measured by increased METs and functional capacity (6MWT)       Able to understand and use rate of perceived exertion (RPE) scale Yes       Intervention Provide education and explanation on how to use RPE scale       Expected Outcomes Short Term: Able to use RPE daily in rehab to express subjective intensity level;Long Term:  Able to use RPE to guide intensity level when exercising independently       Able to understand and use Dyspnea scale Yes       Intervention Provide education and explanation on how to use Dyspnea scale       Expected Outcomes Short Term: Able to use Dyspnea scale daily in rehab to express subjective sense of shortness of breath during exertion;Long Term: Able to use Dyspnea scale to guide intensity level when exercising independently       Knowledge and understanding of Target Heart Rate Range (THRR) Yes       Intervention Provide education and explanation of THRR including how the numbers were predicted and where they are located for reference       Expected Outcomes Short Term: Able to state/look up THRR;Short Term: Able to use daily as guideline for intensity in rehab;Long Term: Able to use THRR to govern intensity when exercising independently       Able to check pulse independently Yes       Intervention Provide education and demonstration on how to check pulse in carotid and radial arteries.;Review the importance of being able to check your own pulse for safety during independent exercise       Expected Outcomes Short Term: Able to explain why pulse checking is important during independent exercise;Long Term: Able to check pulse independently and accurately       Understanding of Exercise Prescription Yes       Intervention Provide education,  explanation, and written materials on patient's individual exercise prescription       Expected Outcomes Short Term: Able to explain program exercise prescription;Long Term: Able to explain home exercise prescription to exercise independently       Improve claudication pain toleration; Improve walking ability --       Intervention --       Expected Outcomes --                Copy of goals given to participant.

## 2022-08-23 ENCOUNTER — Encounter: Payer: Self-pay | Admitting: *Deleted

## 2022-08-23 DIAGNOSIS — J449 Chronic obstructive pulmonary disease, unspecified: Secondary | ICD-10-CM

## 2022-08-23 NOTE — Progress Notes (Signed)
Pulmonary Individual Treatment Plan  Patient Details  Name: [name] [name] MRN: [id number] Date of Birth: February 28, 1951 Referring Provider:   Flowsheet Row Pulmonary Rehab from 08/22/2022 in [facility] Cardiac and Pulmonary Rehab  Referring Provider Grier Rocher MD       Initial Encounter Date:  Flowsheet Row Pulmonary Rehab from 08/22/2022 in [facility] Cardiac and Pulmonary Rehab  Date 08/22/22       Visit Diagnosis: Chronic obstructive pulmonary disease, unspecified COPD type ([place])  Patient's Home Medications on Admission:  Current Outpatient Medications:    acetaminophen (TYLENOL) 325 MG tablet, Take 650 mg by mouth every 6 (six) hours as needed for headache or mild pain., Disp: , Rfl:    albuterol (VENTOLIN HFA) 108 (90 Base) MCG/ACT inhaler, Inhale 1-2 puffs into the lungs every 6 (six) hours as needed., Disp: , Rfl:    ascorbic acid (VITAMIN C) 1000 MG tablet, Take 1,000 mg by mouth daily., Disp: , Rfl:    aspirin 81 MG EC tablet, Take 81 mg by mouth daily., Disp: , Rfl:    carvedilol (COREG) 3.125 MG tablet, Take 1 tablet (3.125 mg total) by mouth 2 (two) times daily., Disp: 60 tablet, Rfl: 0   Cholecalciferol 10 MCG (400 UNIT) CAPS, Take 400 Units by mouth daily., Disp: , Rfl:    fluticasone-salmeterol (WIXELA INHUB) 250-50 MCG/ACT AEPB, Inhale 1 puff into the lungs in the morning and at bedtime., Disp: 180 each, Rfl: 3   furosemide (LASIX) 40 MG tablet, Take 1 tablet (40 mg total) by mouth daily for 7 days., Disp: 30 tablet, Rfl: 0   lisinopril (ZESTRIL) 5 MG tablet, Take 1 tablet (5 mg total) by mouth daily., Disp: 90 tablet, Rfl: 0   Multiple Vitamin (MULTIVITAMIN) capsule, Take 1 capsule by mouth daily., Disp: , Rfl:    Multiple Vitamins-Minerals (THERA-M) TABS, Take 1 tablet by mouth daily., Disp: , Rfl:    pantoprazole (PROTONIX) 40 MG tablet, Take 40 mg by mouth daily., Disp: , Rfl:    predniSONE (DELTASONE) 20 MG tablet, Take 1 tablet (20 mg total) by mouth daily with breakfast.,  Disp: 5 tablet, Rfl: 0   senna-docusate (SENOKOT-S) 8.6-50 MG tablet, Take 1 tablet by mouth at bedtime as needed for mild constipation., Disp: 20 tablet, Rfl: 0   simvastatin (ZOCOR) 20 MG tablet, Take 10 mg by mouth daily., Disp: , Rfl:    Tiotropium Bromide Monohydrate (SPIRIVA RESPIMAT) 2.5 MCG/ACT AERS, Inhale 2 puffs into the lungs daily., Disp: 12 g, Rfl: 3  Past Medical History: Past Medical History:  Diagnosis Date   Cancer ([place])    lung   CHF (congestive heart failure) (HCC)    Diabetes mellitus without complication ([place])    Dyspnea    Hyperlipidemia    Hypertension     Tobacco Use: Social History   Tobacco Use  Smoking Status Former  Smokeless Tobacco Never    Labs: Review Flowsheet  More data exists      Latest Ref Rng & Units 08/21/2021 08/22/2021 08/23/2021 08/24/2021 01/23/2022  Labs for ITP Cardiac and Pulmonary Rehab  Hemoglobin A1c 4.8 - 5.6 % - - 5.5  - -  PH, Arterial 7.350 - 7.450 - 7.365  7.392  7.346  7.270  7.378  - 7.340   PCO2 arterial 32.0 - 48.0 mmHg - 78.1  74.5  86.7  104.5  76.1  - 71.3   Bicarbonate 20.0 - 28.0 mmol/L - 44.3  45.3  47.4  47.9  45.0  40.9  42.4  37.4   TCO2 22 - 32 mmol/L 22 - 32 mmol/L - 47  47  50  >50  47  43  45  -  O2 Saturation % 84.7  94.3  94.0  95.0  96.0  99.0  99.0  68.0  66.0  99.4      Pulmonary Assessment Scores:  Pulmonary Assessment Scores     Row Name 08/22/22 1541         ADL UCSD   ADL Phase Entry     SOB Score total 56     Rest 0     Walk 2     Stairs 4     Bath 3     Dress 1     Shop 3       CAT Score   CAT Score 10       mMRC Score   mMRC Score 2              UCSD: Self-administered rating of dyspnea associated with activities of daily living (ADLs) 6-point scale (0 = "not at all" to 5 = "maximal or unable to do because of breathlessness")  Scoring Scores range from 0 to 120.  Minimally important difference is 5 units  CAT: CAT can identify the health impairment of COPD patients and  is better correlated with disease progression.  CAT has a scoring range of zero to 40. The CAT score is classified into four groups of low (less than 10), medium (10 - 20), high (21-30) and very high (31-40) based on the impact level of disease on health status. A CAT score over 10 suggests significant symptoms.  A worsening CAT score could be explained by an exacerbation, poor medication adherence, poor inhaler technique, or progression of COPD or comorbid conditions.  CAT MCID is 2 points  mMRC: mMRC (Modified Medical Research Council) Dyspnea Scale is used to assess the degree of baseline functional disability in patients of respiratory disease due to dyspnea. No minimal important difference is established. A decrease in score of 1 point or greater is considered a positive change.   Pulmonary Function Assessment:   Exercise Target Goals: Exercise Program Goal: Individual exercise prescription set using results from initial 6 min walk test and THRR while considering  patient's activity barriers and safety.   Exercise Prescription Goal: Initial exercise prescription builds to 30-45 minutes a day of aerobic activity, 2-3 days per week.  Home exercise guidelines will be given to patient during program as part of exercise prescription that the participant will acknowledge.  Education: Aerobic Exercise: - Group verbal and visual presentation on the components of exercise prescription. Introduces F.I.T.T principle from ACSM for exercise prescriptions.  Reviews F.I.T.T. principles of aerobic exercise including progression. Written material given at graduation. Flowsheet Row Pulmonary Rehab from 08/22/2022 in [facility] Cardiac and Pulmonary Rehab  Education need identified 08/22/22       Education: Resistance Exercise: - Group verbal and visual presentation on the components of exercise prescription. Introduces F.I.T.T principle from ACSM for exercise prescriptions  Reviews F.I.T.T. principles of  resistance exercise including progression. Written material given at graduation.    Education: Exercise & Equipment Safety: - Individual verbal instruction and demonstration of equipment use and safety with use of the equipment. Flowsheet Row Pulmonary Rehab from 08/22/2022 in [facility] Cardiac and Pulmonary Rehab  Date 08/22/22  Educator NT  Instruction Review Code 1- Verbalizes Understanding       Education: Exercise Physiology & General Exercise Guidelines: -  Group verbal and written instruction with models to review the exercise physiology of the cardiovascular system and associated critical values. Provides general exercise guidelines with specific guidelines to those with heart or lung disease.    Education: Flexibility, Balance, Mind/Body Relaxation: - Group verbal and visual presentation with interactive activity on the components of exercise prescription. Introduces F.I.T.T principle from ACSM for exercise prescriptions. Reviews F.I.T.T. principles of flexibility and balance exercise training including progression. Also discusses the mind body connection.  Reviews various relaxation techniques to help reduce and manage stress (i.e. Deep breathing, progressive muscle relaxation, and visualization). Balance handout provided to take home. Written material given at graduation.   Activity Barriers & Risk Stratification:  Activity Barriers & Cardiac Risk Stratification - 08/22/22 1510       Activity Barriers & Cardiac Risk Stratification   Activity Barriers Joint Problems;Deconditioning;Muscular Weakness;Shortness of Breath             6 Minute Walk:  6 Minute Walk     Row Name 08/22/22 1506         6 Minute Walk   Phase Initial     Distance 455 feet     Walk Time 2.92 minutes     # of Rest Breaks 1     MPH 1.77     METS 2.03     RPE 11     Perceived Dyspnea  2     VO2 Peak 7.1     Symptoms Yes (comment)     Comments SOB     Resting HR 86 bpm     Resting BP 112/64      Resting Oxygen Saturation  93 %     Exercise Oxygen Saturation  during 6 min walk 79 %     Max Ex. HR 89 bpm     Max Ex. BP 138/80     2 Minute Post BP 126/78       Interval HR   1 Minute HR 86     2 Minute HR 87     3 Minute HR 88     4 Minute HR 89  Stopped at 3:17     2 Minute Post HR 74     Interval Heart Rate? Yes       Interval Oxygen   Interval Oxygen? Yes     Baseline Oxygen Saturation % 93 %     1 Minute Oxygen Saturation % 92 %     1 Minute Liters of Oxygen 3 L     2 Minute Oxygen Saturation % 84 %     2 Minute Liters of Oxygen 3 L     3 Minute Oxygen Saturation % 82 %     3 Minute Liters of Oxygen 3 L     4 Minute Oxygen Saturation % 79 %  Stopped at 3:17     4 Minute Liters of Oxygen 3 L     2 Minute Post Oxygen Saturation % 99 %     2 Minute Post Liters of Oxygen 3 L             Oxygen Initial Assessment:  Oxygen Initial Assessment - 08/22/22 1543       Home Oxygen   Home Oxygen Device E-Tanks    Sleep Oxygen Prescription Continuous    Liters per minute 3    Home Exercise Oxygen Prescription Continuous    Liters per minute 3    Home Resting Oxygen Prescription Continuous  Liters per minute 3    Compliance with Home Oxygen Use Yes      Intervention   Short Term Goals To learn and exhibit compliance with exercise, home and travel O2 prescription;To learn and understand importance of monitoring SPO2 with pulse oximeter and demonstrate accurate use of the pulse oximeter.;To learn and understand importance of maintaining oxygen saturations>88%;To learn and demonstrate proper pursed lip breathing techniques or other breathing techniques. ;To learn and demonstrate proper use of respiratory medications    Long  Term Goals Exhibits compliance with exercise, home  and travel O2 prescription;Verbalizes importance of monitoring SPO2 with pulse oximeter and return demonstration;Maintenance of O2 saturations>88%;Exhibits proper breathing techniques, such as pursed  lip breathing or other method taught during program session;Compliance with respiratory medication;Demonstrates proper use of MDI's             Oxygen Re-Evaluation:   Oxygen Discharge (Final Oxygen Re-Evaluation):   Initial Exercise Prescription:  Initial Exercise Prescription - 08/22/22 1500       Date of Initial Exercise RX and Referring Provider   Date 08/22/22    Referring Provider Grier Rocher MD      Oxygen   Oxygen Continuous    Liters 3    Maintain Oxygen Saturation 88% or higher      Treadmill   MPH 1.6    Grade 0    Minutes 15    METs 2.23      Recumbant Bike   Level 1    RPM 50    Minutes 15    METs 2.03      NuStep   Level 1    SPM 80    Minutes 15    METs 2.03      Biostep-RELP   Level 1    SPM 50    Minutes 15    METs 2.03      Track   Laps 20    Minutes 15    METs 2.09      Prescription Details   Frequency (times per week) 3    Duration Progress to 30 minutes of continuous aerobic without signs/symptoms of physical distress      Intensity   THRR 40-80% of Max Heartrate 112-138    Ratings of Perceived Exertion 11-13    Perceived Dyspnea 0-4      Progression   Progression Continue to progress workloads to maintain intensity without signs/symptoms of physical distress.      Resistance Training   Training Prescription Yes    Weight 5 lb    Reps 10-15             Perform Capillary Blood Glucose checks as needed.  Exercise Prescription Changes:   Exercise Prescription Changes     Row Name 08/22/22 1500             Response to Exercise   Blood Pressure (Admit) 112/64       Blood Pressure (Exercise) 138/80       Blood Pressure (Exit) 126/78       Heart Rate (Admit) 86 bpm       Heart Rate (Exercise) 89 bpm       Heart Rate (Exit) 74 bpm       Oxygen Saturation (Admit) 93 %       Oxygen Saturation (Exercise) 79 %       Oxygen Saturation (Exit) 99 %       Rating of Perceived Exertion (Exercise) 11  Perceived Dyspnea (Exercise) 2       Symptoms SOB       Comments 6MWT Results       Duration Progress to 30 minutes of  aerobic without signs/symptoms of physical distress       Intensity THRR unchanged                Exercise Comments:   Exercise Goals and Review:   Exercise Goals     Row Name 08/22/22 1529             Exercise Goals   Increase Physical Activity Yes       Intervention Develop an individualized exercise prescription for aerobic and resistive training based on initial evaluation findings, risk stratification, comorbidities and participant's personal goals.;Provide advice, education, support and counseling about physical activity/exercise needs.       Expected Outcomes Short Term: Attend rehab on a regular basis to increase amount of physical activity.;Long Term: Add in home exercise to make exercise part of routine and to increase amount of physical activity.;Long Term: Exercising regularly at least 3-5 days a week.       Increase Strength and Stamina Yes       Intervention Provide advice, education, support and counseling about physical activity/exercise needs.;Develop an individualized exercise prescription for aerobic and resistive training based on initial evaluation findings, risk stratification, comorbidities and participant's personal goals.       Expected Outcomes Short Term: Increase workloads from initial exercise prescription for resistance, speed, and METs.;Short Term: Perform resistance training exercises routinely during rehab and add in resistance training at home;Long Term: Improve cardiorespiratory fitness, muscular endurance and strength as measured by increased METs and functional capacity (6MWT)       Able to understand and use rate of perceived exertion (RPE) scale Yes       Intervention Provide education and explanation on how to use RPE scale       Expected Outcomes Short Term: Able to use RPE daily in rehab to express subjective intensity  level;Long Term:  Able to use RPE to guide intensity level when exercising independently       Able to understand and use Dyspnea scale Yes       Intervention Provide education and explanation on how to use Dyspnea scale       Expected Outcomes Short Term: Able to use Dyspnea scale daily in rehab to express subjective sense of shortness of breath during exertion;Long Term: Able to use Dyspnea scale to guide intensity level when exercising independently       Knowledge and understanding of Target Heart Rate Range (THRR) Yes       Intervention Provide education and explanation of THRR including how the numbers were predicted and where they are located for reference       Expected Outcomes Short Term: Able to state/look up THRR;Short Term: Able to use daily as guideline for intensity in rehab;Long Term: Able to use THRR to govern intensity when exercising independently       Able to check pulse independently Yes       Intervention Provide education and demonstration on how to check pulse in carotid and radial arteries.;Review the importance of being able to check your own pulse for safety during independent exercise       Expected Outcomes Short Term: Able to explain why pulse checking is important during independent exercise;Long Term: Able to check pulse independently and accurately       Understanding of  Exercise Prescription Yes       Intervention Provide education, explanation, and written materials on patient's individual exercise prescription       Expected Outcomes Short Term: Able to explain program exercise prescription;Long Term: Able to explain home exercise prescription to exercise independently       Improve claudication pain toleration; Improve walking ability --       Intervention --       Expected Outcomes --                Exercise Goals Re-Evaluation :   Discharge Exercise Prescription (Final Exercise Prescription Changes):  Exercise Prescription Changes - 08/22/22 1500        Response to Exercise   Blood Pressure (Admit) 112/64    Blood Pressure (Exercise) 138/80    Blood Pressure (Exit) 126/78    Heart Rate (Admit) 86 bpm    Heart Rate (Exercise) 89 bpm    Heart Rate (Exit) 74 bpm    Oxygen Saturation (Admit) 93 %    Oxygen Saturation (Exercise) 79 %    Oxygen Saturation (Exit) 99 %    Rating of Perceived Exertion (Exercise) 11    Perceived Dyspnea (Exercise) 2    Symptoms SOB    Comments 6MWT Results    Duration Progress to 30 minutes of  aerobic without signs/symptoms of physical distress    Intensity THRR unchanged             Nutrition:  Target Goals: Understanding of nutrition guidelines, daily intake of sodium <1567m, cholesterol <202m calories 30% from fat and 7% or less from saturated fats, daily to have 5 or more servings of fruits and vegetables.  Education: All About Nutrition: -Group instruction provided by verbal, written material, interactive activities, discussions, models, and posters to present general guidelines for heart healthy nutrition including fat, fiber, MyPlate, the role of sodium in heart healthy nutrition, utilization of the nutrition label, and utilization of this knowledge for meal planning. Follow up email sent as well. Written material given at graduation. [facility]  Education need identified 08/22/22       Biometrics:  Pre Biometrics - 08/22/22 1530       Pre Biometrics   Height _0  (1.727 m)    Weight 121 lb 12.8 oz (55.2 kg)    Waist Circumference 28.5 inches    Hip Circumference 35 inches    Waist to Hip Ratio 0.81 %    BMI (Calculated) 18.52              Nutrition Therapy Plan and Nutrition Goals:   Nutrition Assessments:  MEDIFICTS Score Key: ?70 Need to make dietary changes  40-70 Heart Healthy Diet ? 40 Therapeutic Level Cholesterol Diet  [facility]  Picture Your Plate Total Score on Admission 40      Picture Your Plate Scores: <4<44nhealthy dietary pattern with much room for improvement. 41-50 Dietary pattern unlikely to meet recommendations for good health and room for improvement. 51-60 More healthful dietary pattern, with some room for improvement.  >60 Healthy dietary pattern, although there may be some specific behaviors that could be improved.   Nutrition Goals Re-Evaluation:   Nutrition Goals Discharge (Final Nutrition Goals Re-Evaluation):   Psychosocial: Target Goals: Acknowledge presence or absence of significant depression and/or stress, maximize coping skills, provide positive support system. Participant is able to verbalize  types and ability to use techniques and skills needed for reducing stress and depression.   Education: Stress, Anxiety, and Depression - Group verbal and visual presentation to define topics covered.  Reviews how body is impacted by stress, anxiety, and depression.  Also discusses healthy ways to reduce stress and to treat/manage anxiety and depression.  Written material given at graduation.   Education: Sleep Hygiene -Provides group verbal and written instruction about how sleep can affect your health.  Define sleep hygiene, discuss sleep cycles and impact of sleep habits. Review good sleep hygiene tips.    Initial Review & Psychosocial Screening:  Initial Psych Review & Screening - 07/21/22 1413       Initial Review   Current issues with Current Stress Concerns    Source of Stress Concerns Chronic Illness;Unable to participate in former interests or hobbies      [place]? Yes   daughter, friend, family     Barriers   Psychosocial barriers to participate in program There are no identifiable barriers or psychosocial needs.;The patient should benefit from training in stress management and relaxation.      Screening Interventions    Interventions Encouraged to exercise;To provide support and resources with identified psychosocial needs    Expected Outcomes Short Term goal: Utilizing psychosocial counselor, staff and physician to assist with identification of specific Stressors or current issues interfering with healing process. Setting desired goal for each stressor or current issue identified.;Long Term Goal: Stressors or current issues are controlled or eliminated.;Short Term goal: Identification and review with participant of any Quality of Life or Depression concerns found by scoring the questionnaire.;Long Term goal: The participant improves quality of Life and PHQ9 Scores as seen by post scores and/or verbalization of changes             Quality of Life Scores:  Scores of 19 and below usually indicate a poorer quality of life in these areas.  A difference of  2-3 points is a clinically meaningful difference.  A difference of 2-3 points in the total score of the Quality of Life Index has been associated with significant improvement in overall quality of life, self-image, physical symptoms, and general health in studies assessing change in quality of life.  PHQ-9: Review Flowsheet       08/22/2022  Depression screen PHQ 2/9  Decreased Interest 0  Down, Depressed, Hopeless 0  PHQ - 2 Score 0  Altered sleeping 1  Tired, decreased energy 1  Change in appetite 0  Feeling bad or failure about yourself  0  Trouble concentrating 0  Moving slowly or fidgety/restless 0  Suicidal thoughts 0  PHQ-9 Score 2  Difficult doing work/chores Not difficult at all   Interpretation of Total Score  Total Score Depression Severity:  1-4 = Minimal depression, 5-9 = Mild depression, 10-14 = Moderate depression, 15-19 = Moderately severe depression, 20-27 = Severe depression   Psychosocial Evaluation and Intervention:  Psychosocial Evaluation - 07/21/22 1426       Psychosocial Evaluation & Interventions   Interventions  Encouraged to exercise with the program and follow exercise prescription;Stress management education;Relaxation education    Comments Inman is coming to pulmonary rehab for COPD. He reports getting really sick in September of 2022. He used to exercise before then and wants to get his strength back. He reports possible PTSD from when he was in the service and he is investigating that with this doctor. When asked about his symptoms/triggers  he stated he can get startled easily and mainly he has a hard time with being patient. He notes that his family stress is a lot right now and is contributing to his mental health. He wants to get involved in the program to help with his stamina and overall feeling better    Expected Outcomes Short: attend pulmonary rehab for education and exercise. Long: develop and maintain positive self care habits.    Continue Psychosocial Services  Follow up required by staff             Psychosocial Re-Evaluation:   Psychosocial Discharge (Final Psychosocial Re-Evaluation):   Education: Education Goals: Education classes will be provided on a weekly basis, covering required topics. Participant will state understanding/return demonstration of topics presented.  Learning Barriers/Preferences:  Learning Barriers/Preferences - 07/21/22 1413       Learning Barriers/Preferences   Learning Barriers None    Learning Preferences Individual Instruction             General Pulmonary Education Topics:  Infection Prevention: - Provides verbal and written material to individual with discussion of infection control including proper hand washing and proper equipment cleaning during exercise session. Flowsheet Row Pulmonary Rehab from 08/22/2022 in [facility] Cardiac and Pulmonary Rehab  Date 08/22/22  Educator NT  Instruction Review Code 1- Verbalizes Understanding       Falls Prevention: - Provides verbal and written material to individual with discussion of falls  prevention and safety. Flowsheet Row Pulmonary Rehab from 08/22/2022 in [facility] Cardiac and Pulmonary Rehab  Date 08/22/22  Educator NT  Instruction Review Code 1- Verbalizes Understanding       Chronic Lung Disease Review: - Group verbal instruction with posters, models, PowerPoint presentations and videos,  to review new updates, new respiratory medications, new advancements in procedures and treatments. Providing information on websites and "800" numbers for continued self-education. Includes information about supplement oxygen, available portable oxygen systems, continuous and intermittent flow rates, oxygen safety, concentrators, and Medicare reimbursement for oxygen. Explanation of Pulmonary Drugs, including class, frequency, complications, importance of spacers, rinsing mouth after steroid MDI's, and proper cleaning methods for nebulizers. Review of basic lung anatomy and physiology related to function, structure, and complications of lung disease. Review of risk factors. Discussion about methods for diagnosing sleep apnea and types of masks and machines for OSA. Includes a review of the use of types of environmental controls: home humidity, furnaces, filters, dust mite/pet prevention, HEPA vacuums. Discussion about weather changes, air quality and the benefits of nasal washing. Instruction on Warning signs, infection symptoms, calling MD promptly, preventive modes, and value of vaccinations. Review of effective airway clearance, coughing and/or vibration techniques. Emphasizing that all should Create an Action Plan. Written material given at graduation. Flowsheet Row Pulmonary Rehab from 08/22/2022 in [facility] Cardiac and Pulmonary Rehab  Education need identified 08/22/22       AED/CPR: - Group verbal and written instruction with the use of models to demonstrate the basic use of the AED with the basic ABC's of resuscitation.    Anatomy and Cardiac Procedures: - Group verbal and visual  presentation and models provide information about basic cardiac anatomy and function. Reviews the testing methods done to diagnose heart disease and the outcomes of the test results. Describes the treatment choices: Medical Management, Angioplasty, or Coronary Bypass Surgery for treating various heart conditions including Myocardial Infarction, Angina, Valve Disease, and Cardiac Arrhythmias.  Written material given at graduation. Flowsheet Row Pulmonary Rehab from 08/22/2022 in [facility] Cardiac and  Pulmonary Rehab  Education need identified 08/22/22       Medication Safety: - Group verbal and visual instruction to review commonly prescribed medications for heart and lung disease. Reviews the medication, class of the drug, and side effects. Includes the steps to properly store meds and maintain the prescription regimen.  Written material given at graduation. Flowsheet Row Pulmonary Rehab from 08/22/2022 in [facility] Cardiac and Pulmonary Rehab  Education need identified 08/22/22       Other: -Provides group and verbal instruction on various topics (see comments)   Knowledge Questionnaire Score:  Knowledge Questionnaire Score - 08/22/22 1541       Knowledge Questionnaire Score   Pre Score 12/18              Core Components/Risk Factors/Patient Goals at Admission:  Personal Goals and Risk Factors at Admission - 08/22/22 1544       Core Components/Risk Factors/Patient Goals on Admission    Weight Management Weight Gain;Yes    Intervention Weight Management: Develop a combined nutrition and exercise program designed to reach desired caloric intake, while maintaining appropriate intake of nutrient and fiber, sodium and fats, and appropriate energy expenditure required for the weight goal.;Weight Management: Provide education and appropriate resources to help participant work on and attain dietary goals.    Admit Weight 121 lb 12.8 oz (55.2 kg)    Goal Weight: Short Term 125 lb (56.7 kg)    Goal  Weight: Long Term 130 lb (59 kg)    Expected Outcomes Short Term: Continue to assess and modify interventions until short term weight is achieved;Long Term: Adherence to nutrition and physical activity/exercise program aimed toward attainment of established weight goal;Weight Maintenance: Understanding of the daily nutrition guidelines, which includes 25-35% calories from fat, 7% or less cal from saturated fats, less than 223m cholesterol, less than 1.5gm of sodium, & 5 or more servings of fruits and vegetables daily;Weight Gain: Understanding of general recommendations for a high calorie, high protein meal plan that promotes weight gain by distributing calorie intake throughout the day with the consumption for 4-5 meals, snacks, and/or supplements;Understanding of distribution of calorie intake throughout the day with the consumption of 4-5 meals/snacks;Understanding recommendations for meals to include 15-35% energy as protein, 25-35% energy from fat, 35-60% energy from carbohydrates, less than 2035mof dietary cholesterol, 20-35 gm of total fiber daily    Improve shortness of breath with ADL's Yes    Intervention Provide education, individualized exercise plan and daily activity instruction to help decrease symptoms of SOB with activities of daily living.    Expected Outcomes Short Term: Improve cardiorespiratory fitness to achieve a reduction of symptoms when performing ADLs;Long Term: Be able to perform more ADLs without symptoms or delay the onset of symptoms    Heart Failure Yes    Intervention Provide a combined exercise and nutrition program that is supplemented with education, support and counseling about heart failure. Directed toward relieving symptoms such as shortness of breath, decreased exercise tolerance, and extremity edema.    Expected Outcomes Improve functional capacity of life;Short term: Attendance in program 2-3 days a week with increased exercise capacity. Reported lower sodium  intake. Reported increased fruit and vegetable intake. Reports medication compliance.;Short term: Daily weights obtained and reported for increase. Utilizing diuretic protocols set by physician.;Long term: Adoption of self-care skills and reduction of barriers for early signs and symptoms recognition and intervention leading to self-care maintenance.    Hypertension Yes    Intervention Provide education on lifestyle  modifcations including regular physical activity/exercise, weight management, moderate sodium restriction and increased consumption of fresh fruit, vegetables, and low fat dairy, alcohol moderation, and smoking cessation.;Monitor prescription use compliance.    Expected Outcomes Short Term: Continued assessment and intervention until BP is < 140/70m HG in hypertensive participants. < 130/8102mHG in hypertensive participants with diabetes, heart failure or chronic kidney disease.;Long Term: Maintenance of blood pressure at goal levels.    Lipids Yes    Intervention Provide education and support for participant on nutrition & aerobic/resistive exercise along with prescribed medications to achieve LDL <7057mHDL >26m78m  Expected Outcomes Short Term: Participant states understanding of desired cholesterol values and is compliant with medications prescribed. Participant is following exercise prescription and nutrition guidelines.;Long Term: Cholesterol controlled with medications as prescribed, with individualized exercise RX and with personalized nutrition plan. Value goals: LDL < 70mg101mL > 40 mg.             Education:Diabetes - Individual verbal and written instruction to review signs/symptoms of diabetes, desired ranges of glucose level fasting, after meals and with exercise. Acknowledge that pre and post exercise glucose checks will be done for 3 sessions at entry of program.   Know Your Numbers and Heart Failure: - Group verbal and visual instruction to discuss disease risk  factors for cardiac and pulmonary disease and treatment options.  Reviews associated critical values for Overweight/Obesity, Hypertension, Cholesterol, and Diabetes.  Discusses basics of heart failure: signs/symptoms and treatments.  Introduces Heart Failure Zone chart for action plan for heart failure.  Written material given at graduation.   Core Components/Risk Factors/Patient Goals Review:    Core Components/Risk Factors/Patient Goals at Discharge (Final Review):    ITP Comments:  ITP Comments     Row Name 07/21/22 1421 08/22/22 1458 08/23/22 1418       ITP Comments Initial telephone orientation completed. [facility]. EP orientation scheduled for Tuesday 8/22 at 10am. Completed 6MWT and gym orientation. Initial ITP created and sent for review to Dr. Fuad Ottie Glazierical Director. 30 Day review completed. Medical Director ITP review done, changes made as directed, and signed approval by Medical Director.    NEW              Comments:

## 2022-08-28 ENCOUNTER — Ambulatory Visit: Payer: No Typology Code available for payment source

## 2022-08-29 ENCOUNTER — Ambulatory Visit: Payer: No Typology Code available for payment source

## 2022-08-31 ENCOUNTER — Ambulatory Visit: Payer: No Typology Code available for payment source

## 2022-09-04 ENCOUNTER — Telehealth: Payer: Self-pay | Admitting: Pulmonary Disease

## 2022-09-04 ENCOUNTER — Ambulatory Visit: Payer: No Typology Code available for payment source

## 2022-09-04 NOTE — Telephone Encounter (Signed)
Pt called and is very confused about what is being sent for him and where to. After many prodding questions, I belive pt is needing to set up oxygen or maybe an ONO. Advised he needed to call [employer] and was about to provide number, but pt states he doesnt think it's [employer]. States he has not heard from anyone regarding this. Please advise.

## 2022-09-05 ENCOUNTER — Ambulatory Visit: Payer: No Typology Code available for payment source

## 2022-09-06 ENCOUNTER — Telehealth: Payer: Self-pay

## 2022-09-06 ENCOUNTER — Encounter: Payer: Self-pay | Admitting: *Deleted

## 2022-09-06 DIAGNOSIS — J449 Chronic obstructive pulmonary disease, unspecified: Secondary | ICD-10-CM

## 2022-09-06 NOTE — Telephone Encounter (Signed)
Called and spoke with Micheal from [facility] and asked if he could give this patient a call with an update in regards to his oxygen. Nothing further needed

## 2022-09-06 NOTE — Telephone Encounter (Signed)
Called pt as he was scheduled to start rehab Monday 09/04/22. Pt reports that his daughter was going to take him, but is still recovering from an operation. He will call us back this week to see when he can start.

## 2022-09-07 ENCOUNTER — Ambulatory Visit: Payer: No Typology Code available for payment source

## 2022-09-11 ENCOUNTER — Ambulatory Visit: Payer: No Typology Code available for payment source

## 2022-09-12 ENCOUNTER — Ambulatory Visit: Payer: No Typology Code available for payment source

## 2022-09-14 ENCOUNTER — Telehealth: Payer: Self-pay

## 2022-09-14 ENCOUNTER — Ambulatory Visit: Payer: No Typology Code available for payment source

## 2022-09-14 DIAGNOSIS — J449 Chronic obstructive pulmonary disease, unspecified: Secondary | ICD-10-CM

## 2022-09-14 NOTE — Telephone Encounter (Signed)
Attempted to call patient to follow up on Pulmonary Rehab and left message asking for call back. Has not been here since orientation on 9/5 due to transportation issues.

## 2022-09-18 ENCOUNTER — Ambulatory Visit: Payer: No Typology Code available for payment source

## 2022-09-18 ENCOUNTER — Encounter: Payer: Self-pay | Admitting: *Deleted

## 2022-09-18 DIAGNOSIS — J449 Chronic obstructive pulmonary disease, unspecified: Secondary | ICD-10-CM

## 2022-09-19 ENCOUNTER — Ambulatory Visit: Payer: No Typology Code available for payment source

## 2022-09-20 ENCOUNTER — Encounter: Payer: Self-pay | Admitting: *Deleted

## 2022-09-20 DIAGNOSIS — J449 Chronic obstructive pulmonary disease, unspecified: Secondary | ICD-10-CM

## 2022-09-20 NOTE — Progress Notes (Signed)
Pulmonary Individual Treatment Plan  Patient Details  Name: Jarvin Ogren MRN: [id number] Date of Birth: 1951/05/09 Referring Provider:   Flowsheet Row Pulmonary Rehab from 08/22/2022 in [facility] Cardiac and Pulmonary Rehab  Referring Provider Grier Rocher MD       Initial Encounter Date:  Flowsheet Row Pulmonary Rehab from 08/22/2022 in [facility] Cardiac and Pulmonary Rehab  Date 08/22/22       Visit Diagnosis: Chronic obstructive pulmonary disease, unspecified COPD type ([place])  Patient's Home Medications on Admission:  Current Outpatient Medications:    acetaminophen (TYLENOL) 325 MG tablet, Take 650 mg by mouth every 6 (six) hours as needed for headache or mild pain., Disp: , Rfl:    albuterol (VENTOLIN HFA) 108 (90 Base) MCG/ACT inhaler, Inhale 1-2 puffs into the lungs every 6 (six) hours as needed., Disp: , Rfl:    ascorbic acid (VITAMIN C) 1000 MG tablet, Take 1,000 mg by mouth daily., Disp: , Rfl:    aspirin 81 MG EC tablet, Take 81 mg by mouth daily., Disp: , Rfl:    carvedilol (COREG) 3.125 MG tablet, Take 1 tablet (3.125 mg total) by mouth 2 (two) times daily., Disp: 60 tablet, Rfl: 0   Cholecalciferol 10 MCG (400 UNIT) CAPS, Take 400 Units by mouth daily., Disp: , Rfl:    fluticasone-salmeterol (WIXELA INHUB) 250-50 MCG/ACT AEPB, Inhale 1 puff into the lungs in the morning and at bedtime., Disp: 180 each, Rfl: 3   furosemide (LASIX) 40 MG tablet, Take 1 tablet (40 mg total) by mouth daily for 7 days., Disp: 30 tablet, Rfl: 0   lisinopril (ZESTRIL) 5 MG tablet, Take 1 tablet (5 mg total) by mouth daily., Disp: 90 tablet, Rfl: 0   Multiple Vitamin (MULTIVITAMIN) capsule, Take 1 capsule by mouth daily., Disp: , Rfl:    Multiple Vitamins-Minerals (THERA-M) TABS, Take 1 tablet by mouth daily., Disp: , Rfl:    pantoprazole (PROTONIX) 40 MG tablet, Take 40 mg by mouth daily., Disp: , Rfl:    predniSONE (DELTASONE) 20 MG tablet, Take 1 tablet (20 mg total) by mouth daily with breakfast.,  Disp: 5 tablet, Rfl: 0   senna-docusate (SENOKOT-S) 8.6-50 MG tablet, Take 1 tablet by mouth at bedtime as needed for mild constipation., Disp: 20 tablet, Rfl: 0   simvastatin (ZOCOR) 20 MG tablet, Take 10 mg by mouth daily., Disp: , Rfl:    Tiotropium Bromide Monohydrate (SPIRIVA RESPIMAT) 2.5 MCG/ACT AERS, Inhale 2 puffs into the lungs daily., Disp: 12 g, Rfl: 3  Past Medical History: Past Medical History:  Diagnosis Date   Cancer ([place])    lung   CHF (congestive heart failure) (HCC)    Diabetes mellitus without complication ([place])    Dyspnea    Hyperlipidemia    Hypertension     Tobacco Use: Social History   Tobacco Use  Smoking Status Former  Smokeless Tobacco Never    Labs: Review Flowsheet  More data exists      Latest Ref Rng & Units 08/21/2021 08/22/2021 08/23/2021 08/24/2021 01/23/2022  Labs for ITP Cardiac and Pulmonary Rehab  Hemoglobin A1c 4.8 - 5.6 % - - 5.5  - -  PH, Arterial 7.350 - 7.450 - 7.365  7.392  7.346  7.270  7.378  - 7.340   PCO2 arterial 32.0 - 48.0 mmHg - 78.1  74.5  86.7  104.5  76.1  - 71.3   Bicarbonate 20.0 - 28.0 mmol/L - 44.3  45.3  47.4  47.9  45.0  40.9  42.4  37.4   TCO2 22 - 32 mmol/L 22 - 32 mmol/L - 47  47  50  >50  47  43  45  -  O2 Saturation % 84.7  94.3  94.0  95.0  96.0  99.0  99.0  68.0  66.0  99.4      Pulmonary Assessment Scores:  Pulmonary Assessment Scores     Row Name 08/22/22 1541         ADL UCSD   ADL Phase Entry     SOB Score total 56     Rest 0     Walk 2     Stairs 4     Bath 3     Dress 1     Shop 3       CAT Score   CAT Score 10       mMRC Score   mMRC Score 2              UCSD: Self-administered rating of dyspnea associated with activities of daily living (ADLs) 6-point scale (0 = "not at all" to 5 = "maximal or unable to do because of breathlessness")  Scoring Scores range from 0 to 120.  Minimally important difference is 5 units  CAT: CAT can identify the health impairment of COPD patients and  is better correlated with disease progression.  CAT has a scoring range of zero to 40. The CAT score is classified into four groups of low (less than 10), medium (10 - 20), high (21-30) and very high (31-40) based on the impact level of disease on health status. A CAT score over 10 suggests significant symptoms.  A worsening CAT score could be explained by an exacerbation, poor medication adherence, poor inhaler technique, or progression of COPD or comorbid conditions.  CAT MCID is 2 points  mMRC: mMRC (Modified Medical Research Council) Dyspnea Scale is used to assess the degree of baseline functional disability in patients of respiratory disease due to dyspnea. No minimal important difference is established. A decrease in score of 1 point or greater is considered a positive change.   Pulmonary Function Assessment:   Exercise Target Goals: Exercise Program Goal: Individual exercise prescription set using results from initial 6 min walk test and THRR while considering  patient's activity barriers and safety.   Exercise Prescription Goal: Initial exercise prescription builds to 30-45 minutes a day of aerobic activity, 2-3 days per week.  Home exercise guidelines will be given to patient during program as part of exercise prescription that the participant will acknowledge.  Education: Aerobic Exercise: - Group verbal and visual presentation on the components of exercise prescription. Introduces F.I.T.T principle from ACSM for exercise prescriptions.  Reviews F.I.T.T. principles of aerobic exercise including progression. Written material given at graduation. Flowsheet Row Pulmonary Rehab from 08/22/2022 in [facility] Cardiac and Pulmonary Rehab  Education need identified 08/22/22       Education: Resistance Exercise: - Group verbal and visual presentation on the components of exercise prescription. Introduces F.I.T.T principle from ACSM for exercise prescriptions  Reviews F.I.T.T. principles of  resistance exercise including progression. Written material given at graduation.    Education: Exercise & Equipment Safety: - Individual verbal instruction and demonstration of equipment use and safety with use of the equipment. Flowsheet Row Pulmonary Rehab from 08/22/2022 in [facility] Cardiac and Pulmonary Rehab  Date 08/22/22  Educator NT  Instruction Review Code 1- Verbalizes Understanding       Education: Exercise Physiology & General Exercise Guidelines: -  Group verbal and written instruction with models to review the exercise physiology of the cardiovascular system and associated critical values. Provides general exercise guidelines with specific guidelines to those with heart or lung disease.    Education: Flexibility, Balance, Mind/Body Relaxation: - Group verbal and visual presentation with interactive activity on the components of exercise prescription. Introduces F.I.T.T principle from ACSM for exercise prescriptions. Reviews F.I.T.T. principles of flexibility and balance exercise training including progression. Also discusses the mind body connection.  Reviews various relaxation techniques to help reduce and manage stress (i.e. Deep breathing, progressive muscle relaxation, and visualization). Balance handout provided to take home. Written material given at graduation.   Activity Barriers & Risk Stratification:  Activity Barriers & Cardiac Risk Stratification - 08/22/22 1510       Activity Barriers & Cardiac Risk Stratification   Activity Barriers Joint Problems;Deconditioning;Muscular Weakness;Shortness of Breath             6 Minute Walk:  6 Minute Walk     Row Name 08/22/22 1506         6 Minute Walk   Phase Initial     Distance 455 feet     Walk Time 2.92 minutes     # of Rest Breaks 1     MPH 1.77     METS 2.03     RPE 11     Perceived Dyspnea  2     VO2 Peak 7.1     Symptoms Yes (comment)     Comments SOB     Resting HR 86 bpm     Resting BP 112/64      Resting Oxygen Saturation  93 %     Exercise Oxygen Saturation  during 6 min walk 79 %     Max Ex. HR 89 bpm     Max Ex. BP 138/80     2 Minute Post BP 126/78       Interval HR   1 Minute HR 86     2 Minute HR 87     3 Minute HR 88     4 Minute HR 89  Stopped at 3:17     2 Minute Post HR 74     Interval Heart Rate? Yes       Interval Oxygen   Interval Oxygen? Yes     Baseline Oxygen Saturation % 93 %     1 Minute Oxygen Saturation % 92 %     1 Minute Liters of Oxygen 3 L     2 Minute Oxygen Saturation % 84 %     2 Minute Liters of Oxygen 3 L     3 Minute Oxygen Saturation % 82 %     3 Minute Liters of Oxygen 3 L     4 Minute Oxygen Saturation % 79 %  Stopped at 3:17     4 Minute Liters of Oxygen 3 L     2 Minute Post Oxygen Saturation % 99 %     2 Minute Post Liters of Oxygen 3 L             Oxygen Initial Assessment:  Oxygen Initial Assessment - 08/22/22 1543       Home Oxygen   Home Oxygen Device E-Tanks    Sleep Oxygen Prescription Continuous    Liters per minute 3    Home Exercise Oxygen Prescription Continuous    Liters per minute 3    Home Resting Oxygen Prescription Continuous  Liters per minute 3    Compliance with Home Oxygen Use Yes      Intervention   Short Term Goals To learn and exhibit compliance with exercise, home and travel O2 prescription;To learn and understand importance of monitoring SPO2 with pulse oximeter and demonstrate accurate use of the pulse oximeter.;To learn and understand importance of maintaining oxygen saturations>88%;To learn and demonstrate proper pursed lip breathing techniques or other breathing techniques. ;To learn and demonstrate proper use of respiratory medications    Long  Term Goals Exhibits compliance with exercise, home  and travel O2 prescription;Verbalizes importance of monitoring SPO2 with pulse oximeter and return demonstration;Maintenance of O2 saturations>88%;Exhibits proper breathing techniques, such as pursed  lip breathing or other method taught during program session;Compliance with respiratory medication;Demonstrates proper use of MDI's             Oxygen Re-Evaluation:   Oxygen Discharge (Final Oxygen Re-Evaluation):   Initial Exercise Prescription:  Initial Exercise Prescription - 08/22/22 1500       Date of Initial Exercise RX and Referring Provider   Date 08/22/22    Referring Provider Grier Rocher MD      Oxygen   Oxygen Continuous    Liters 3    Maintain Oxygen Saturation 88% or higher      Treadmill   MPH 1.6    Grade 0    Minutes 15    METs 2.23      Recumbant Bike   Level 1    RPM 50    Minutes 15    METs 2.03      NuStep   Level 1    SPM 80    Minutes 15    METs 2.03      Biostep-RELP   Level 1    SPM 50    Minutes 15    METs 2.03      Track   Laps 20    Minutes 15    METs 2.09      Prescription Details   Frequency (times per week) 3    Duration Progress to 30 minutes of continuous aerobic without signs/symptoms of physical distress      Intensity   THRR 40-80% of Max Heartrate 112-138    Ratings of Perceived Exertion 11-13    Perceived Dyspnea 0-4      Progression   Progression Continue to progress workloads to maintain intensity without signs/symptoms of physical distress.      Resistance Training   Training Prescription Yes    Weight 5 lb    Reps 10-15             Perform Capillary Blood Glucose checks as needed.  Exercise Prescription Changes:   Exercise Prescription Changes     Row Name 08/22/22 1500             Response to Exercise   Blood Pressure (Admit) 112/64       Blood Pressure (Exercise) 138/80       Blood Pressure (Exit) 126/78       Heart Rate (Admit) 86 bpm       Heart Rate (Exercise) 89 bpm       Heart Rate (Exit) 74 bpm       Oxygen Saturation (Admit) 93 %       Oxygen Saturation (Exercise) 79 %       Oxygen Saturation (Exit) 99 %       Rating of Perceived Exertion (Exercise) 11  Perceived Dyspnea (Exercise) 2       Symptoms SOB       Comments 6MWT Results       Duration Progress to 30 minutes of  aerobic without signs/symptoms of physical distress       Intensity THRR unchanged                Exercise Comments:   Exercise Goals and Review:   Exercise Goals     Row Name 08/22/22 1529             Exercise Goals   Increase Physical Activity Yes       Intervention Develop an individualized exercise prescription for aerobic and resistive training based on initial evaluation findings, risk stratification, comorbidities and participant's personal goals.;Provide advice, education, support and counseling about physical activity/exercise needs.       Expected Outcomes Short Term: Attend rehab on a regular basis to increase amount of physical activity.;Long Term: Add in home exercise to make exercise part of routine and to increase amount of physical activity.;Long Term: Exercising regularly at least 3-5 days a week.       Increase Strength and Stamina Yes       Intervention Provide advice, education, support and counseling about physical activity/exercise needs.;Develop an individualized exercise prescription for aerobic and resistive training based on initial evaluation findings, risk stratification, comorbidities and participant's personal goals.       Expected Outcomes Short Term: Increase workloads from initial exercise prescription for resistance, speed, and METs.;Short Term: Perform resistance training exercises routinely during rehab and add in resistance training at home;Long Term: Improve cardiorespiratory fitness, muscular endurance and strength as measured by increased METs and functional capacity (6MWT)       Able to understand and use rate of perceived exertion (RPE) scale Yes       Intervention Provide education and explanation on how to use RPE scale       Expected Outcomes Short Term: Able to use RPE daily in rehab to express subjective intensity  level;Long Term:  Able to use RPE to guide intensity level when exercising independently       Able to understand and use Dyspnea scale Yes       Intervention Provide education and explanation on how to use Dyspnea scale       Expected Outcomes Short Term: Able to use Dyspnea scale daily in rehab to express subjective sense of shortness of breath during exertion;Long Term: Able to use Dyspnea scale to guide intensity level when exercising independently       Knowledge and understanding of Target Heart Rate Range (THRR) Yes       Intervention Provide education and explanation of THRR including how the numbers were predicted and where they are located for reference       Expected Outcomes Short Term: Able to state/look up THRR;Short Term: Able to use daily as guideline for intensity in rehab;Long Term: Able to use THRR to govern intensity when exercising independently       Able to check pulse independently Yes       Intervention Provide education and demonstration on how to check pulse in carotid and radial arteries.;Review the importance of being able to check your own pulse for safety during independent exercise       Expected Outcomes Short Term: Able to explain why pulse checking is important during independent exercise;Long Term: Able to check pulse independently and accurately       Understanding of  Exercise Prescription Yes       Intervention Provide education, explanation, and written materials on patient's individual exercise prescription       Expected Outcomes Short Term: Able to explain program exercise prescription;Long Term: Able to explain home exercise prescription to exercise independently       Improve claudication pain toleration; Improve walking ability --       Intervention --       Expected Outcomes --                Exercise Goals Re-Evaluation :  Exercise Goals Re-Evaluation     Row Name 09/06/22 1517 09/18/22 1526           Exercise Goal Re-Evaluation    Comments Jung has yet to start rehab due to transportation issues.  His daughter has had surgery and is unable to bring him and he did not feel comfortable taking his oxygen on the bus. Nicklos has yet to start rehab due to transportation issues.  His daughter has had surgery and is unable to bring him and he did not feel comfortable taking his oxygen on the bus.               Discharge Exercise Prescription (Final Exercise Prescription Changes):  Exercise Prescription Changes - 08/22/22 1500       Response to Exercise   Blood Pressure (Admit) 112/64    Blood Pressure (Exercise) 138/80    Blood Pressure (Exit) 126/78    Heart Rate (Admit) 86 bpm    Heart Rate (Exercise) 89 bpm    Heart Rate (Exit) 74 bpm    Oxygen Saturation (Admit) 93 %    Oxygen Saturation (Exercise) 79 %    Oxygen Saturation (Exit) 99 %    Rating of Perceived Exertion (Exercise) 11    Perceived Dyspnea (Exercise) 2    Symptoms SOB    Comments 6MWT Results    Duration Progress to 30 minutes of  aerobic without signs/symptoms of physical distress    Intensity THRR unchanged             Nutrition:  Target Goals: Understanding of nutrition guidelines, daily intake of sodium <1565m, cholesterol <2076m calories 30% from fat and 7% or less from saturated fats, daily to have 5 or more servings of fruits and vegetables.  Education: All About Nutrition: -Group instruction provided by verbal, written material, interactive activities, discussions, models, and posters to present general guidelines for heart healthy nutrition including fat, fiber, MyPlate, the role of sodium in heart healthy nutrition, utilization of the nutrition label, and utilization of this knowledge for meal planning. Follow up email sent as well. Written material given at graduation. [facility]  Education need identified 08/22/22       Biometrics:  Pre Biometrics -  08/22/22 1530       Pre Biometrics   Height _0  (1.727 m)    Weight 121 lb 12.8 oz (55.2 kg)    Waist Circumference 28.5 inches    Hip Circumference 35 inches    Waist to Hip Ratio 0.81 %    BMI (Calculated) 18.52              Nutrition Therapy Plan and Nutrition Goals:   Nutrition Assessments:  MEDIFICTS Score Key: ?70 Need to make dietary changes  40-70 Heart Healthy Diet ? 40 Therapeutic Level Cholesterol Diet  [facility]  Picture Your Plate Total Score on Admission 40      Picture Your Plate Scores: <66 Unhealthy dietary pattern with much room for improvement. 41-50 Dietary pattern unlikely to meet recommendations for good health and room for improvement. 51-60 More healthful dietary pattern, with some room for improvement.  >60 Healthy dietary pattern, although there may be some specific behaviors that could be improved.   Nutrition Goals Re-Evaluation:   Nutrition Goals Discharge (Final Nutrition Goals Re-Evaluation):   Psychosocial: Target Goals: Acknowledge presence or absence of significant depression and/or stress, maximize coping skills, provide positive support system. Participant is able to verbalize types and ability to use techniques and skills needed for reducing stress and depression.   Education: Stress, Anxiety, and Depression - Group verbal and visual presentation to define topics covered.  Reviews how body is impacted by stress, anxiety, and depression.  Also discusses healthy ways to reduce stress and to treat/manage anxiety and depression.  Written material given at graduation.   Education: Sleep Hygiene -Provides group verbal and written instruction about how sleep can affect your health.  Define sleep hygiene, discuss sleep cycles and impact of sleep habits. Review good sleep hygiene tips.    Initial Review & Psychosocial Screening:  Initial Psych Review & Screening  - 07/21/22 1413       Initial Review   Current issues with Current Stress Concerns    Source of Stress Concerns Chronic Illness;Unable to participate in former interests or hobbies      [place]? Yes   daughter, friend, family     Barriers   Psychosocial barriers to participate in program There are no identifiable barriers or psychosocial needs.;The patient should benefit from training in stress management and relaxation.      Screening Interventions   Interventions Encouraged to exercise;To provide support and resources with identified psychosocial needs    Expected Outcomes Short Term goal: Utilizing psychosocial counselor, staff and physician to assist with identification of specific Stressors or current issues interfering with healing process. Setting desired goal for each stressor or current issue identified.;Long Term Goal: Stressors or current issues are controlled or eliminated.;Short Term goal: Identification and review with participant of any Quality of Life or Depression concerns found by scoring the questionnaire.;Long Term goal: The participant improves quality of Life and PHQ9 Scores as seen by post scores and/or verbalization of changes             Quality of Life Scores:  Scores of 19 and below usually indicate a poorer quality of life in these areas.  A difference of  2-3 points is a clinically meaningful difference.  A difference of 2-3 points in the total score of the Quality of Life Index has been associated with significant improvement in overall quality of life, self-image, physical symptoms, and general health in studies assessing change in quality of life.  PHQ-9: Review Flowsheet       08/22/2022  Depression screen PHQ 2/9  Decreased Interest 0  Down, Depressed, Hopeless 0  PHQ - 2 Score 0  Altered sleeping 1  Tired, decreased energy 1  Change in appetite 0  Feeling bad or failure about yourself  0  Trouble concentrating 0   Moving slowly or fidgety/restless 0  Suicidal thoughts 0  PHQ-9 Score 2  Difficult doing work/chores Not difficult at all   Interpretation of Total Score  Total Score Depression Severity:  1-4 = Minimal depression, 5-9 = Mild  depression, 10-14 = Moderate depression, 15-19 = Moderately severe depression, 20-27 = Severe depression   Psychosocial Evaluation and Intervention:  Psychosocial Evaluation - 07/21/22 1426       Psychosocial Evaluation & Interventions   Interventions Encouraged to exercise with the program and follow exercise prescription;Stress management education;Relaxation education    Comments Thaddeus is coming to pulmonary rehab for COPD. He reports getting really sick in September of 2022. He used to exercise before then and wants to get his strength back. He reports possible PTSD from when he was in the service and he is investigating that with this doctor. When asked about his symptoms/triggers he stated he can get startled easily and mainly he has a hard time with being patient. He notes that his family stress is a lot right now and is contributing to his mental health. He wants to get involved in the program to help with his stamina and overall feeling better    Expected Outcomes Short: attend pulmonary rehab for education and exercise. Long: develop and maintain positive self care habits.    Continue Psychosocial Services  Follow up required by staff             Psychosocial Re-Evaluation:   Psychosocial Discharge (Final Psychosocial Re-Evaluation):   Education: Education Goals: Education classes will be provided on a weekly basis, covering required topics. Participant will state understanding/return demonstration of topics presented.  Learning Barriers/Preferences:  Learning Barriers/Preferences - 07/21/22 1413       Learning Barriers/Preferences   Learning Barriers None    Learning Preferences Individual Instruction             General Pulmonary  Education Topics:  Infection Prevention: - Provides verbal and written material to individual with discussion of infection control including proper hand washing and proper equipment cleaning during exercise session. Flowsheet Row Pulmonary Rehab from 08/22/2022 in [facility] Cardiac and Pulmonary Rehab  Date 08/22/22  Educator NT  Instruction Review Code 1- Verbalizes Understanding       Falls Prevention: - Provides verbal and written material to individual with discussion of falls prevention and safety. Flowsheet Row Pulmonary Rehab from 08/22/2022 in [facility] Cardiac and Pulmonary Rehab  Date 08/22/22  Educator NT  Instruction Review Code 1- Verbalizes Understanding       Chronic Lung Disease Review: - Group verbal instruction with posters, models, PowerPoint presentations and videos,  to review new updates, new respiratory medications, new advancements in procedures and treatments. Providing information on websites and "800" numbers for continued self-education. Includes information about supplement oxygen, available portable oxygen systems, continuous and intermittent flow rates, oxygen safety, concentrators, and Medicare reimbursement for oxygen. Explanation of Pulmonary Drugs, including class, frequency, complications, importance of spacers, rinsing mouth after steroid MDI's, and proper cleaning methods for nebulizers. Review of basic lung anatomy and physiology related to function, structure, and complications of lung disease. Review of risk factors. Discussion about methods for diagnosing sleep apnea and types of masks and machines for OSA. Includes a review of the use of types of environmental controls: home humidity, furnaces, filters, dust mite/pet prevention, HEPA vacuums. Discussion about weather changes, air quality and the benefits of nasal washing. Instruction on Warning signs, infection symptoms, calling MD promptly, preventive modes, and value of vaccinations. Review of effective airway  clearance, coughing and/or vibration techniques. Emphasizing that all should Create an Action Plan. Written material given at graduation. Flowsheet Row Pulmonary Rehab from 08/22/2022 in [facility] Cardiac and Pulmonary Rehab  Education need identified 08/22/22  AED/CPR: - Group verbal and written instruction with the use of models to demonstrate the basic use of the AED with the basic ABC's of resuscitation.    Anatomy and Cardiac Procedures: - Group verbal and visual presentation and models provide information about basic cardiac anatomy and function. Reviews the testing methods done to diagnose heart disease and the outcomes of the test results. Describes the treatment choices: Medical Management, Angioplasty, or Coronary Bypass Surgery for treating various heart conditions including Myocardial Infarction, Angina, Valve Disease, and Cardiac Arrhythmias.  Written material given at graduation. Flowsheet Row Pulmonary Rehab from 08/22/2022 in [facility] Cardiac and Pulmonary Rehab  Education need identified 08/22/22       Medication Safety: - Group verbal and visual instruction to review commonly prescribed medications for heart and lung disease. Reviews the medication, class of the drug, and side effects. Includes the steps to properly store meds and maintain the prescription regimen.  Written material given at graduation. Flowsheet Row Pulmonary Rehab from 08/22/2022 in [facility] Cardiac and Pulmonary Rehab  Education need identified 08/22/22       Other: -Provides group and verbal instruction on various topics (see comments)   Knowledge Questionnaire Score:  Knowledge Questionnaire Score - 08/22/22 1541       Knowledge Questionnaire Score   Pre Score 12/18              Core Components/Risk Factors/Patient Goals at Admission:  Personal Goals and Risk Factors at Admission - 08/22/22 1544       Core Components/Risk Factors/Patient Goals on Admission    Weight Management Weight  Gain;Yes    Intervention Weight Management: Develop a combined nutrition and exercise program designed to reach desired caloric intake, while maintaining appropriate intake of nutrient and fiber, sodium and fats, and appropriate energy expenditure required for the weight goal.;Weight Management: Provide education and appropriate resources to help participant work on and attain dietary goals.    Admit Weight 121 lb 12.8 oz (55.2 kg)    Goal Weight: Short Term 125 lb (56.7 kg)    Goal Weight: Long Term 130 lb (59 kg)    Expected Outcomes Short Term: Continue to assess and modify interventions until short term weight is achieved;Long Term: Adherence to nutrition and physical activity/exercise program aimed toward attainment of established weight goal;Weight Maintenance: Understanding of the daily nutrition guidelines, which includes 25-35% calories from fat, 7% or less cal from saturated fats, less than 218m cholesterol, less than 1.5gm of sodium, & 5 or more servings of fruits and vegetables daily;Weight Gain: Understanding of general recommendations for a high calorie, high protein meal plan that promotes weight gain by distributing calorie intake throughout the day with the consumption for 4-5 meals, snacks, and/or supplements;Understanding of distribution of calorie intake throughout the day with the consumption of 4-5 meals/snacks;Understanding recommendations for meals to include 15-35% energy as protein, 25-35% energy from fat, 35-60% energy from carbohydrates, less than 2068mof dietary cholesterol, 20-35 gm of total fiber daily    Improve shortness of breath with ADL's Yes    Intervention Provide education, individualized exercise plan and daily activity instruction to help decrease symptoms of SOB with activities of daily living.    Expected Outcomes Short Term: Improve cardiorespiratory fitness to achieve a reduction of symptoms when performing ADLs;Long Term: Be able to perform more ADLs without  symptoms or delay the onset of symptoms    Heart Failure Yes    Intervention Provide a combined exercise and nutrition program that is supplemented  with education, support and counseling about heart failure. Directed toward relieving symptoms such as shortness of breath, decreased exercise tolerance, and extremity edema.    Expected Outcomes Improve functional capacity of life;Short term: Attendance in program 2-3 days a week with increased exercise capacity. Reported lower sodium intake. Reported increased fruit and vegetable intake. Reports medication compliance.;Short term: Daily weights obtained and reported for increase. Utilizing diuretic protocols set by physician.;Long term: Adoption of self-care skills and reduction of barriers for early signs and symptoms recognition and intervention leading to self-care maintenance.    Hypertension Yes    Intervention Provide education on lifestyle modifcations including regular physical activity/exercise, weight management, moderate sodium restriction and increased consumption of fresh fruit, vegetables, and low fat dairy, alcohol moderation, and smoking cessation.;Monitor prescription use compliance.    Expected Outcomes Short Term: Continued assessment and intervention until BP is < 140/59m HG in hypertensive participants. < 130/841mHG in hypertensive participants with diabetes, heart failure or chronic kidney disease.;Long Term: Maintenance of blood pressure at goal levels.    Lipids Yes    Intervention Provide education and support for participant on nutrition & aerobic/resistive exercise along with prescribed medications to achieve LDL <7051mHDL >29m24m  Expected Outcomes Short Term: Participant states understanding of desired cholesterol values and is compliant with medications prescribed. Participant is following exercise prescription and nutrition guidelines.;Long Term: Cholesterol controlled with medications as prescribed, with individualized  exercise RX and with personalized nutrition plan. Value goals: LDL < 70mg26mL > 40 mg.             Education:Diabetes - Individual verbal and written instruction to review signs/symptoms of diabetes, desired ranges of glucose level fasting, after meals and with exercise. Acknowledge that pre and post exercise glucose checks will be done for 3 sessions at entry of program.   Know Your Numbers and Heart Failure: - Group verbal and visual instruction to discuss disease risk factors for cardiac and pulmonary disease and treatment options.  Reviews associated critical values for Overweight/Obesity, Hypertension, Cholesterol, and Diabetes.  Discusses basics of heart failure: signs/symptoms and treatments.  Introduces Heart Failure Zone chart for action plan for heart failure.  Written material given at graduation.   Core Components/Risk Factors/Patient Goals Review:    Core Components/Risk Factors/Patient Goals at Discharge (Final Review):    ITP Comments:  ITP Comments     Row Name 07/21/22 1421 08/22/22 1458 08/23/22 1418 09/06/22 1516 09/14/22 1125   ITP Comments Initial telephone orientation completed. [facility]. EP orientation scheduled for Tuesday 8/22 at 10am. Completed 6MWT and gym orientation. Initial ITP created and sent for review to Dr. Fuad Ottie Glazierical Director. 30 Day review completed. Medical Director ITP review done, changes made as directed, and signed approval by Medical Director.    NEW Einer has yet to start rehab due to transportation issues.  His daughter has had surgery and is unable to bring him and he did not feel comfortable taking his oxygen on the bus. Attempted to call patient to follow up on Pulmonary Rehab and left message asking for call back. Has not been here since orientation on 9/5 due to transportation issues.    [place] 09/18/22 1557 09/20/22 0742         ITP Comments Jonael returned our call.   His daughter got her plates removed today from her surgery and is hoping to being to drive again next.  They are also working  on an alternative if she is unable to drive him.  He will keep [place] posted. 30 Day review completed. Medical Director ITP review done, changes made as directed, and signed approval by Medical Director.   Out for family medical               Comments:

## 2022-09-21 ENCOUNTER — Ambulatory Visit: Payer: No Typology Code available for payment source

## 2022-09-25 ENCOUNTER — Ambulatory Visit: Payer: No Typology Code available for payment source

## 2022-09-26 ENCOUNTER — Ambulatory Visit: Payer: No Typology Code available for payment source

## 2022-09-28 ENCOUNTER — Ambulatory Visit: Payer: No Typology Code available for payment source | Admitting: *Deleted

## 2022-10-02 ENCOUNTER — Telehealth: Payer: Self-pay

## 2022-10-02 ENCOUNTER — Ambulatory Visit: Payer: No Typology Code available for payment source

## 2022-10-02 NOTE — Telephone Encounter (Signed)
Called patient to get update on his transportation for rehab, patient states he should know by tomorrow and will call us back then.

## 2022-10-03 ENCOUNTER — Ambulatory Visit: Payer: No Typology Code available for payment source

## 2022-10-05 ENCOUNTER — Ambulatory Visit: Payer: No Typology Code available for payment source

## 2022-10-05 NOTE — Telephone Encounter (Signed)
No response back this week. Will send letter at this time. Has not attended since orientation on 08/22/22.

## 2022-10-09 ENCOUNTER — Ambulatory Visit: Payer: No Typology Code available for payment source | Admitting: *Deleted

## 2022-10-10 ENCOUNTER — Ambulatory Visit: Payer: No Typology Code available for payment source | Admitting: *Deleted

## 2022-10-12 ENCOUNTER — Ambulatory Visit: Payer: No Typology Code available for payment source

## 2022-10-12 DIAGNOSIS — J449 Chronic obstructive pulmonary disease, unspecified: Secondary | ICD-10-CM

## 2022-10-12 NOTE — Progress Notes (Signed)
Discharge Progress Report  Patient Details  Name: [name] [name] MRN: [id number] Date of Birth: 07-07-1951 Referring Provider:   Flowsheet Row Pulmonary Rehab from 08/22/2022 in [facility] Cardiac and Pulmonary Rehab  Referring Provider Grier Rocher MD        Number of Visits: 1  Reason for Discharge:  Early Exit:  Lack of attendance  Smoking History:  Social History   Tobacco Use  Smoking Status Former  Smokeless Tobacco Never    Diagnosis:  Chronic obstructive pulmonary disease, unspecified COPD type ([place])  ADL UCSD:  Pulmonary Assessment Scores     Row Name 08/22/22 1541         ADL UCSD   ADL Phase Entry     SOB Score total 56     Rest 0     Walk 2     Stairs 4     Bath 3     Dress 1     Shop 3       CAT Score   CAT Score 10       mMRC Score   mMRC Score 2              Initial Exercise Prescription:  Initial Exercise Prescription - 08/22/22 1500       Date of Initial Exercise RX and Referring Provider   Date 08/22/22    Referring Provider Grier Rocher MD      Oxygen   Oxygen Continuous    Liters 3    Maintain Oxygen Saturation 88% or higher      Treadmill   MPH 1.6    Grade 0    Minutes 15    METs 2.23      Recumbant Bike   Level 1    RPM 50    Minutes 15    METs 2.03      NuStep   Level 1    SPM 80    Minutes 15    METs 2.03      Biostep-RELP   Level 1    SPM 50    Minutes 15    METs 2.03      Track   Laps 20    Minutes 15    METs 2.09      Prescription Details   Frequency (times per week) 3    Duration Progress to 30 minutes of continuous aerobic without signs/symptoms of physical distress      Intensity   THRR 40-80% of Max Heartrate 112-138    Ratings of Perceived Exertion 11-13    Perceived Dyspnea 0-4      Progression   Progression Continue to progress workloads to maintain intensity without signs/symptoms of physical distress.      Resistance Training   Training Prescription Yes    Weight 5 lb     Reps 10-15             Discharge Exercise Prescription (Final Exercise Prescription Changes):  Exercise Prescription Changes - 08/22/22 1500       Response to Exercise   Blood Pressure (Admit) 112/64    Blood Pressure (Exercise) 138/80    Blood Pressure (Exit) 126/78    Heart Rate (Admit) 86 bpm    Heart Rate (Exercise) 89 bpm    Heart Rate (Exit) 74 bpm    Oxygen Saturation (Admit) 93 %    Oxygen Saturation (Exercise) 79 %    Oxygen Saturation (Exit) 99 %    Rating of Perceived Exertion (Exercise) 11  Perceived Dyspnea (Exercise) 2    Symptoms SOB    Comments 6MWT Results    Duration Progress to 30 minutes of  aerobic without signs/symptoms of physical distress    Intensity THRR unchanged             Functional Capacity:  6 Minute Walk     Row Name 08/22/22 1506         6 Minute Walk   Phase Initial     Distance 455 feet     Walk Time 2.92 minutes     # of Rest Breaks 1     MPH 1.77     METS 2.03     RPE 11     Perceived Dyspnea  2     VO2 Peak 7.1     Symptoms Yes (comment)     Comments SOB     Resting HR 86 bpm     Resting BP 112/64     Resting Oxygen Saturation  93 %     Exercise Oxygen Saturation  during 6 min walk 79 %     Max Ex. HR 89 bpm     Max Ex. BP 138/80     2 Minute Post BP 126/78       Interval HR   1 Minute HR 86     2 Minute HR 87     3 Minute HR 88     4 Minute HR 89  Stopped at 3:17     2 Minute Post HR 74     Interval Heart Rate? Yes       Interval Oxygen   Interval Oxygen? Yes     Baseline Oxygen Saturation % 93 %     1 Minute Oxygen Saturation % 92 %     1 Minute Liters of Oxygen 3 L     2 Minute Oxygen Saturation % 84 %     2 Minute Liters of Oxygen 3 L     3 Minute Oxygen Saturation % 82 %     3 Minute Liters of Oxygen 3 L     4 Minute Oxygen Saturation % 79 %  Stopped at 3:17     4 Minute Liters of Oxygen 3 L     2 Minute Post Oxygen Saturation % 99 %     2 Minute Post Liters of Oxygen 3 L                 Nutrition & Weight - Outcomes:  Pre Biometrics - 08/22/22 1530       Pre Biometrics   Height _0  (1.727 m)    Weight 121 lb 12.8 oz (55.2 kg)    Waist Circumference 28.5 inches    Hip Circumference 35 inches    Waist to Hip Ratio 0.81 %    BMI (Calculated) 18.52               Goals reviewed with patient; copy given to patient.

## 2022-10-12 NOTE — Progress Notes (Signed)
Pulmonary Individual Treatment Plan  Patient Details  Name: [name] [name] MRN: [id number] Date of Birth: February 28, 1951 Referring Provider:   Flowsheet Row Pulmonary Rehab from 08/22/2022 in [facility] Cardiac and Pulmonary Rehab  Referring Provider Grier Rocher MD       Initial Encounter Date:  Flowsheet Row Pulmonary Rehab from 08/22/2022 in [facility] Cardiac and Pulmonary Rehab  Date 08/22/22       Visit Diagnosis: Chronic obstructive pulmonary disease, unspecified COPD type ([place])  Patient's Home Medications on Admission:  Current Outpatient Medications:    acetaminophen (TYLENOL) 325 MG tablet, Take 650 mg by mouth every 6 (six) hours as needed for headache or mild pain., Disp: , Rfl:    albuterol (VENTOLIN HFA) 108 (90 Base) MCG/ACT inhaler, Inhale 1-2 puffs into the lungs every 6 (six) hours as needed., Disp: , Rfl:    ascorbic acid (VITAMIN C) 1000 MG tablet, Take 1,000 mg by mouth daily., Disp: , Rfl:    aspirin 81 MG EC tablet, Take 81 mg by mouth daily., Disp: , Rfl:    carvedilol (COREG) 3.125 MG tablet, Take 1 tablet (3.125 mg total) by mouth 2 (two) times daily., Disp: 60 tablet, Rfl: 0   Cholecalciferol 10 MCG (400 UNIT) CAPS, Take 400 Units by mouth daily., Disp: , Rfl:    fluticasone-salmeterol (WIXELA INHUB) 250-50 MCG/ACT AEPB, Inhale 1 puff into the lungs in the morning and at bedtime., Disp: 180 each, Rfl: 3   furosemide (LASIX) 40 MG tablet, Take 1 tablet (40 mg total) by mouth daily for 7 days., Disp: 30 tablet, Rfl: 0   lisinopril (ZESTRIL) 5 MG tablet, Take 1 tablet (5 mg total) by mouth daily., Disp: 90 tablet, Rfl: 0   Multiple Vitamin (MULTIVITAMIN) capsule, Take 1 capsule by mouth daily., Disp: , Rfl:    Multiple Vitamins-Minerals (THERA-M) TABS, Take 1 tablet by mouth daily., Disp: , Rfl:    pantoprazole (PROTONIX) 40 MG tablet, Take 40 mg by mouth daily., Disp: , Rfl:    predniSONE (DELTASONE) 20 MG tablet, Take 1 tablet (20 mg total) by mouth daily with breakfast.,  Disp: 5 tablet, Rfl: 0   senna-docusate (SENOKOT-S) 8.6-50 MG tablet, Take 1 tablet by mouth at bedtime as needed for mild constipation., Disp: 20 tablet, Rfl: 0   simvastatin (ZOCOR) 20 MG tablet, Take 10 mg by mouth daily., Disp: , Rfl:    Tiotropium Bromide Monohydrate (SPIRIVA RESPIMAT) 2.5 MCG/ACT AERS, Inhale 2 puffs into the lungs daily., Disp: 12 g, Rfl: 3  Past Medical History: Past Medical History:  Diagnosis Date   Cancer ([place])    lung   CHF (congestive heart failure) (HCC)    Diabetes mellitus without complication ([place])    Dyspnea    Hyperlipidemia    Hypertension     Tobacco Use: Social History   Tobacco Use  Smoking Status Former  Smokeless Tobacco Never    Labs: Review Flowsheet  More data exists      Latest Ref Rng & Units 08/21/2021 08/22/2021 08/23/2021 08/24/2021 01/23/2022  Labs for ITP Cardiac and Pulmonary Rehab  Hemoglobin A1c 4.8 - 5.6 % - - 5.5  - -  PH, Arterial 7.350 - 7.450 - 7.365  7.392  7.346  7.270  7.378  - 7.340   PCO2 arterial 32.0 - 48.0 mmHg - 78.1  74.5  86.7  104.5  76.1  - 71.3   Bicarbonate 20.0 - 28.0 mmol/L - 44.3  45.3  47.4  47.9  45.0  40.9  42.4  37.4   TCO2 22 - 32 mmol/L 22 - 32 mmol/L - 47  47  50  >50  47  43  45  -  O2 Saturation % 84.7  94.3  94.0  95.0  96.0  99.0  99.0  68.0  66.0  99.4      Pulmonary Assessment Scores:  Pulmonary Assessment Scores     Row Name 08/22/22 1541         ADL UCSD   ADL Phase Entry     SOB Score total 56     Rest 0     Walk 2     Stairs 4     Bath 3     Dress 1     Shop 3       CAT Score   CAT Score 10       mMRC Score   mMRC Score 2              UCSD: Self-administered rating of dyspnea associated with activities of daily living (ADLs) 6-point scale (0 = "not at all" to 5 = "maximal or unable to do because of breathlessness")  Scoring Scores range from 0 to 120.  Minimally important difference is 5 units  CAT: CAT can identify the health impairment of COPD patients and  is better correlated with disease progression.  CAT has a scoring range of zero to 40. The CAT score is classified into four groups of low (less than 10), medium (10 - 20), high (21-30) and very high (31-40) based on the impact level of disease on health status. A CAT score over 10 suggests significant symptoms.  A worsening CAT score could be explained by an exacerbation, poor medication adherence, poor inhaler technique, or progression of COPD or comorbid conditions.  CAT MCID is 2 points  mMRC: mMRC (Modified Medical Research Council) Dyspnea Scale is used to assess the degree of baseline functional disability in patients of respiratory disease due to dyspnea. No minimal important difference is established. A decrease in score of 1 point or greater is considered a positive change.   Pulmonary Function Assessment:   Exercise Target Goals: Exercise Program Goal: Individual exercise prescription set using results from initial 6 min walk test and THRR while considering  patient's activity barriers and safety.   Exercise Prescription Goal: Initial exercise prescription builds to 30-45 minutes a day of aerobic activity, 2-3 days per week.  Home exercise guidelines will be given to patient during program as part of exercise prescription that the participant will acknowledge.  Education: Aerobic Exercise: - Group verbal and visual presentation on the components of exercise prescription. Introduces F.I.T.T principle from ACSM for exercise prescriptions.  Reviews F.I.T.T. principles of aerobic exercise including progression. Written material given at graduation. Flowsheet Row Pulmonary Rehab from 08/22/2022 in [facility] Cardiac and Pulmonary Rehab  Education need identified 08/22/22       Education: Resistance Exercise: - Group verbal and visual presentation on the components of exercise prescription. Introduces F.I.T.T principle from ACSM for exercise prescriptions  Reviews F.I.T.T. principles of  resistance exercise including progression. Written material given at graduation.    Education: Exercise & Equipment Safety: - Individual verbal instruction and demonstration of equipment use and safety with use of the equipment. Flowsheet Row Pulmonary Rehab from 08/22/2022 in [facility] Cardiac and Pulmonary Rehab  Date 08/22/22  Educator NT  Instruction Review Code 1- Verbalizes Understanding       Education: Exercise Physiology & General Exercise Guidelines: -  Group verbal and written instruction with models to review the exercise physiology of the cardiovascular system and associated critical values. Provides general exercise guidelines with specific guidelines to those with heart or lung disease.    Education: Flexibility, Balance, Mind/Body Relaxation: - Group verbal and visual presentation with interactive activity on the components of exercise prescription. Introduces F.I.T.T principle from ACSM for exercise prescriptions. Reviews F.I.T.T. principles of flexibility and balance exercise training including progression. Also discusses the mind body connection.  Reviews various relaxation techniques to help reduce and manage stress (i.e. Deep breathing, progressive muscle relaxation, and visualization). Balance handout provided to take home. Written material given at graduation.   Activity Barriers & Risk Stratification:  Activity Barriers & Cardiac Risk Stratification - 08/22/22 1510       Activity Barriers & Cardiac Risk Stratification   Activity Barriers Joint Problems;Deconditioning;Muscular Weakness;Shortness of Breath             6 Minute Walk:  6 Minute Walk     Row Name 08/22/22 1506         6 Minute Walk   Phase Initial     Distance 455 feet     Walk Time 2.92 minutes     # of Rest Breaks 1     MPH 1.77     METS 2.03     RPE 11     Perceived Dyspnea  2     VO2 Peak 7.1     Symptoms Yes (comment)     Comments SOB     Resting HR 86 bpm     Resting BP 112/64      Resting Oxygen Saturation  93 %     Exercise Oxygen Saturation  during 6 min walk 79 %     Max Ex. HR 89 bpm     Max Ex. BP 138/80     2 Minute Post BP 126/78       Interval HR   1 Minute HR 86     2 Minute HR 87     3 Minute HR 88     4 Minute HR 89  Stopped at 3:17     2 Minute Post HR 74     Interval Heart Rate? Yes       Interval Oxygen   Interval Oxygen? Yes     Baseline Oxygen Saturation % 93 %     1 Minute Oxygen Saturation % 92 %     1 Minute Liters of Oxygen 3 L     2 Minute Oxygen Saturation % 84 %     2 Minute Liters of Oxygen 3 L     3 Minute Oxygen Saturation % 82 %     3 Minute Liters of Oxygen 3 L     4 Minute Oxygen Saturation % 79 %  Stopped at 3:17     4 Minute Liters of Oxygen 3 L     2 Minute Post Oxygen Saturation % 99 %     2 Minute Post Liters of Oxygen 3 L             Oxygen Initial Assessment:  Oxygen Initial Assessment - 08/22/22 1543       Home Oxygen   Home Oxygen Device E-Tanks    Sleep Oxygen Prescription Continuous    Liters per minute 3    Home Exercise Oxygen Prescription Continuous    Liters per minute 3    Home Resting Oxygen Prescription Continuous  Liters per minute 3    Compliance with Home Oxygen Use Yes      Intervention   Short Term Goals To learn and exhibit compliance with exercise, home and travel O2 prescription;To learn and understand importance of monitoring SPO2 with pulse oximeter and demonstrate accurate use of the pulse oximeter.;To learn and understand importance of maintaining oxygen saturations>88%;To learn and demonstrate proper pursed lip breathing techniques or other breathing techniques. ;To learn and demonstrate proper use of respiratory medications    Long  Term Goals Exhibits compliance with exercise, home  and travel O2 prescription;Verbalizes importance of monitoring SPO2 with pulse oximeter and return demonstration;Maintenance of O2 saturations>88%;Exhibits proper breathing techniques, such as pursed  lip breathing or other method taught during program session;Compliance with respiratory medication;Demonstrates proper use of MDI's             Oxygen Re-Evaluation:   Oxygen Discharge (Final Oxygen Re-Evaluation):   Initial Exercise Prescription:  Initial Exercise Prescription - 08/22/22 1500       Date of Initial Exercise RX and Referring Provider   Date 08/22/22    Referring Provider Grier Rocher MD      Oxygen   Oxygen Continuous    Liters 3    Maintain Oxygen Saturation 88% or higher      Treadmill   MPH 1.6    Grade 0    Minutes 15    METs 2.23      Recumbant Bike   Level 1    RPM 50    Minutes 15    METs 2.03      NuStep   Level 1    SPM 80    Minutes 15    METs 2.03      Biostep-RELP   Level 1    SPM 50    Minutes 15    METs 2.03      Track   Laps 20    Minutes 15    METs 2.09      Prescription Details   Frequency (times per week) 3    Duration Progress to 30 minutes of continuous aerobic without signs/symptoms of physical distress      Intensity   THRR 40-80% of Max Heartrate 112-138    Ratings of Perceived Exertion 11-13    Perceived Dyspnea 0-4      Progression   Progression Continue to progress workloads to maintain intensity without signs/symptoms of physical distress.      Resistance Training   Training Prescription Yes    Weight 5 lb    Reps 10-15             Perform Capillary Blood Glucose checks as needed.  Exercise Prescription Changes:   Exercise Prescription Changes     Row Name 08/22/22 1500             Response to Exercise   Blood Pressure (Admit) 112/64       Blood Pressure (Exercise) 138/80       Blood Pressure (Exit) 126/78       Heart Rate (Admit) 86 bpm       Heart Rate (Exercise) 89 bpm       Heart Rate (Exit) 74 bpm       Oxygen Saturation (Admit) 93 %       Oxygen Saturation (Exercise) 79 %       Oxygen Saturation (Exit) 99 %       Rating of Perceived Exertion (Exercise) 11  Perceived Dyspnea (Exercise) 2       Symptoms SOB       Comments 6MWT Results       Duration Progress to 30 minutes of  aerobic without signs/symptoms of physical distress       Intensity THRR unchanged                Exercise Comments:   Exercise Goals and Review:   Exercise Goals     Row Name 08/22/22 1529             Exercise Goals   Increase Physical Activity Yes       Intervention Develop an individualized exercise prescription for aerobic and resistive training based on initial evaluation findings, risk stratification, comorbidities and participant's personal goals.;Provide advice, education, support and counseling about physical activity/exercise needs.       Expected Outcomes Short Term: Attend rehab on a regular basis to increase amount of physical activity.;Long Term: Add in home exercise to make exercise part of routine and to increase amount of physical activity.;Long Term: Exercising regularly at least 3-5 days a week.       Increase Strength and Stamina Yes       Intervention Provide advice, education, support and counseling about physical activity/exercise needs.;Develop an individualized exercise prescription for aerobic and resistive training based on initial evaluation findings, risk stratification, comorbidities and participant's personal goals.       Expected Outcomes Short Term: Increase workloads from initial exercise prescription for resistance, speed, and METs.;Short Term: Perform resistance training exercises routinely during rehab and add in resistance training at home;Long Term: Improve cardiorespiratory fitness, muscular endurance and strength as measured by increased METs and functional capacity (6MWT)       Able to understand and use rate of perceived exertion (RPE) scale Yes       Intervention Provide education and explanation on how to use RPE scale       Expected Outcomes Short Term: Able to use RPE daily in rehab to express subjective intensity  level;Long Term:  Able to use RPE to guide intensity level when exercising independently       Able to understand and use Dyspnea scale Yes       Intervention Provide education and explanation on how to use Dyspnea scale       Expected Outcomes Short Term: Able to use Dyspnea scale daily in rehab to express subjective sense of shortness of breath during exertion;Long Term: Able to use Dyspnea scale to guide intensity level when exercising independently       Knowledge and understanding of Target Heart Rate Range (THRR) Yes       Intervention Provide education and explanation of THRR including how the numbers were predicted and where they are located for reference       Expected Outcomes Short Term: Able to state/look up THRR;Short Term: Able to use daily as guideline for intensity in rehab;Long Term: Able to use THRR to govern intensity when exercising independently       Able to check pulse independently Yes       Intervention Provide education and demonstration on how to check pulse in carotid and radial arteries.;Review the importance of being able to check your own pulse for safety during independent exercise       Expected Outcomes Short Term: Able to explain why pulse checking is important during independent exercise;Long Term: Able to check pulse independently and accurately       Understanding of  Exercise Prescription Yes       Intervention Provide education, explanation, and written materials on patient's individual exercise prescription       Expected Outcomes Short Term: Able to explain program exercise prescription;Long Term: Able to explain home exercise prescription to exercise independently       Improve claudication pain toleration; Improve walking ability --       Intervention --       Expected Outcomes --                Exercise Goals Re-Evaluation :  Exercise Goals Re-Evaluation     Row Name 09/06/22 1517 09/18/22 1526 10/02/22 1156         Exercise Goal Re-Evaluation    Comments [name] [name] has yet to start rehab due to transportation issues.  His daughter has had surgery and is unable to bring him and he did not feel comfortable taking his oxygen on the bus. [name] [name] has yet to start rehab due to transportation issues.  His daughter has had surgery and is unable to bring him and he did not feel comfortable taking his oxygen on the bus. Patient has still not started rehab due to transportation issues. We have been following up with patient to get updates. Patient supposed to call us tomorrow to let us know what is going on.     Expected Outcomes -- -- Short: Start attending rehab consistently Long: Graduate from [facility]              Discharge Exercise Prescription (Final Exercise Prescription Changes):  Exercise Prescription Changes - 08/22/22 1500       Response to Exercise   Blood Pressure (Admit) 112/64    Blood Pressure (Exercise) 138/80    Blood Pressure (Exit) 126/78    Heart Rate (Admit) 86 bpm    Heart Rate (Exercise) 89 bpm    Heart Rate (Exit) 74 bpm    Oxygen Saturation (Admit) 93 %    Oxygen Saturation (Exercise) 79 %    Oxygen Saturation (Exit) 99 %    Rating of Perceived Exertion (Exercise) 11    Perceived Dyspnea (Exercise) 2    Symptoms SOB    Comments 6MWT Results    Duration Progress to 30 minutes of  aerobic without signs/symptoms of physical distress    Intensity THRR unchanged             Nutrition:  Target Goals: Understanding of nutrition guidelines, daily intake of sodium <1535m, cholesterol <2062m calories 30% from fat and 7% or less from saturated fats, daily to have 5 or more servings of fruits and vegetables.  Education: All About Nutrition: -Group instruction provided by verbal, written material, interactive activities, discussions, models, and posters to present general guidelines for heart healthy nutrition including fat, fiber, MyPlate, the role of sodium in heart healthy nutrition, utilization of the  nutrition label, and utilization of this knowledge for meal planning. Follow up email sent as well. Written material given at graduation. Flow[facility]  Education need identified 08/22/22       Biometrics:  Pre Biometrics - 08/22/22 1530       Pre Biometrics   Height 5' 8" (1.727 m)    Weight 121 lb 12.8 oz (55.2 kg)    Waist Circumference 28.5 inches    Hip Circumference 35 inches    Waist to Hip Ratio 0.81 %    BMI (Calculated) 18.52  Nutrition Therapy Plan and Nutrition Goals:   Nutrition Assessments:  MEDIFICTS Score Key: ?70 Need to make dietary changes  40-70 Heart Healthy Diet ? 40 Therapeutic Level Cholesterol Diet  Flowsheet Row Pulmonary Rehab from 08/22/2022 in  [facility] Cardiac and Pulmonary Rehab  Picture Your Plate Total Score on Admission 40      Picture Your Plate Scores: <95 Unhealthy dietary pattern with much room for improvement. 41-50 Dietary pattern unlikely to meet recommendations for good health and room for improvement. 51-60 More healthful dietary pattern, with some room for improvement.  >60 Healthy dietary pattern, although there may be some specific behaviors that could be improved.   Nutrition Goals Re-Evaluation:   Nutrition Goals Discharge (Final Nutrition Goals Re-Evaluation):   Psychosocial: Target Goals: Acknowledge presence or absence of significant depression and/or stress, maximize coping skills, provide positive support system. Participant is able to verbalize types and ability to use techniques and skills needed for reducing stress and depression.   Education: Stress, Anxiety, and Depression - Group verbal and visual presentation to define topics covered.  Reviews how body is impacted by stress, anxiety, and depression.  Also discusses healthy ways to reduce stress and to treat/manage anxiety and depression.  Written material given at  graduation.   Education: Sleep Hygiene -Provides group verbal and written instruction about how sleep can affect your health.  Define sleep hygiene, discuss sleep cycles and impact of sleep habits. Review good sleep hygiene tips.    Initial Review & Psychosocial Screening:  Initial Psych Review & Screening - 07/21/22 1413       Initial Review   Current issues with Current Stress Concerns    Source of Stress Concerns Chronic Illness;Unable to participate in former interests or hobbies      [place]? Yes   daughter, friend, family     Barriers   Psychosocial barriers to participate in program There are no identifiable barriers or psychosocial needs.;The patient should benefit from training in stress management and relaxation.      Screening Interventions   Interventions Encouraged to exercise;To provide support and resources with identified psychosocial needs    Expected Outcomes Short Term goal: Utilizing psychosocial counselor, staff and physician to assist with identification of specific Stressors or current issues interfering with healing process. Setting desired goal for each stressor or current issue identified.;Long Term Goal: Stressors or current issues are controlled or eliminated.;Short Term goal: Identification and review with participant of any Quality of Life or Depression concerns found by scoring the questionnaire.;Long Term goal: The participant improves quality of Life and PHQ9 Scores as seen by post scores and/or verbalization of changes             Quality of Life Scores:  Scores of 19 and below usually indicate a poorer quality of life in these areas.  A difference of  2-3 points is a clinically meaningful difference.  A difference of 2-3 points in the total score of the Quality of Life Index has been associated with significant improvement in overall quality of life, self-image, physical symptoms, and general health in studies assessing  change in quality of life.  PHQ-9: Review Flowsheet       08/22/2022  Depression screen PHQ 2/9  Decreased Interest 0  Down, Depressed, Hopeless 0  PHQ - 2 Score 0  Altered sleeping 1  Tired, decreased energy 1  Change in appetite 0  Feeling bad or failure about yourself  0  Trouble concentrating 0  Moving slowly or fidgety/restless 0  Suicidal thoughts 0  PHQ-9 Score 2  Difficult doing work/chores Not difficult at all   Interpretation of Total Score  Total Score Depression Severity:  1-4 = Minimal depression, 5-9 = Mild depression, 10-14 = Moderate depression, 15-19 = Moderately severe depression, 20-27 = Severe depression   Psychosocial Evaluation and Intervention:  Psychosocial Evaluation - 07/21/22 1426       Psychosocial Evaluation & Interventions   Interventions Encouraged to exercise with the program and follow exercise prescription;Stress management education;Relaxation education    Comments [name] [name] is coming to pulmonary rehab for COPD. He reports getting really sick in September of 2022. He used to exercise before then and wants to get his strength back. He reports possible PTSD from when he was in the service and he is investigating that with this doctor. When asked about his symptoms/triggers he stated he can get startled easily and mainly he has a hard time with being patient. He notes that his family stress is a lot right now and is contributing to his mental health. He wants to get involved in the program to help with his stamina and overall feeling better    Expected Outcomes Short: attend pulmonary rehab for education and exercise. Long: develop and maintain positive self care habits.    Continue Psychosocial Services  Follow up required by staff             Psychosocial Re-Evaluation:   Psychosocial Discharge (Final Psychosocial Re-Evaluation):   Education: Education Goals: Education classes will be provided on a weekly basis, covering required topics.  Participant will state understanding/return demonstration of topics presented.  Learning Barriers/Preferences:  Learning Barriers/Preferences - 07/21/22 1413       Learning Barriers/Preferences   Learning Barriers None    Learning Preferences Individual Instruction             General Pulmonary Education Topics:  Infection Prevention: - Provides verbal and written material to individual with discussion of infection control including proper hand washing and proper equipment cleaning during exercise session. Flowsheet Row Pulmonary Rehab from 08/22/2022 in [facility] Cardiac and Pulmonary Rehab  Date 08/22/22  Educator NT  Instruction Review Code 1- Verbalizes Understanding       Falls Prevention: - Provides verbal and written material to individual with discussion of falls prevention and safety. Flowsheet Row Pulmonary Rehab from 08/22/2022 in [facility] Cardiac and Pulmonary Rehab  Date 08/22/22  Educator NT  Instruction Review Code 1- Verbalizes Understanding       Chronic Lung Disease Review: - Group verbal instruction with posters, models, PowerPoint presentations and videos,  to review new updates, new respiratory medications, new advancements in procedures and treatments. Providing information on websites and "800" numbers for continued self-education. Includes information about supplement oxygen, available portable oxygen systems, continuous and intermittent flow rates, oxygen safety, concentrators, and Medicare reimbursement for oxygen. Explanation of Pulmonary Drugs, including class, frequency, complications, importance of spacers, rinsing mouth after steroid MDI's, and proper cleaning methods for nebulizers. Review of basic lung anatomy and physiology related to function, structure, and complications of lung disease. Review of risk factors. Discussion about methods for diagnosing sleep apnea and types of masks and machines for OSA. Includes a review of the use of types of  environmental controls: home humidity, furnaces, filters, dust mite/pet prevention, HEPA vacuums. Discussion about weather changes, air quality and the benefits of nasal washing. Instruction on Warning signs, infection symptoms, calling MD promptly, preventive modes, and value of vaccinations. Review  of effective airway clearance, coughing and/or vibration techniques. Emphasizing that all should Create an Action Plan. Written material given at graduation. Flowsheet Row Pulmonary Rehab from 08/22/2022 in [facility] Cardiac and Pulmonary Rehab  Education need identified 08/22/22       AED/CPR: - Group verbal and written instruction with the use of models to demonstrate the basic use of the AED with the basic ABC's of resuscitation.    Anatomy and Cardiac Procedures: - Group verbal and visual presentation and models provide information about basic cardiac anatomy and function. Reviews the testing methods done to diagnose heart disease and the outcomes of the test results. Describes the treatment choices: Medical Management, Angioplasty, or Coronary Bypass Surgery for treating various heart conditions including Myocardial Infarction, Angina, Valve Disease, and Cardiac Arrhythmias.  Written material given at graduation. Flowsheet Row Pulmonary Rehab from 08/22/2022 in [facility] Cardiac and Pulmonary Rehab  Education need identified 08/22/22       Medication Safety: - Group verbal and visual instruction to review commonly prescribed medications for heart and lung disease. Reviews the medication, class of the drug, and side effects. Includes the steps to properly store meds and maintain the prescription regimen.  Written material given at graduation. Flowsheet Row Pulmonary Rehab from 08/22/2022 in [facility] Cardiac and Pulmonary Rehab  Education need identified 08/22/22       Other: -Provides group and verbal instruction on various topics (see comments)   Knowledge Questionnaire Score:  Knowledge Questionnaire  Score - 08/22/22 1541       Knowledge Questionnaire Score   Pre Score 12/18              Core Components/Risk Factors/Patient Goals at Admission:  Personal Goals and Risk Factors at Admission - 08/22/22 1544       Core Components/Risk Factors/Patient Goals on Admission    Weight Management Weight Gain;Yes    Intervention Weight Management: Develop a combined nutrition and exercise program designed to reach desired caloric intake, while maintaining appropriate intake of nutrient and fiber, sodium and fats, and appropriate energy expenditure required for the weight goal.;Weight Management: Provide education and appropriate resources to help participant work on and attain dietary goals.    Admit Weight 121 lb 12.8 oz (55.2 kg)    Goal Weight: Short Term 125 lb (56.7 kg)    Goal Weight: Long Term 130 lb (59 kg)    Expected Outcomes Short Term: Continue to assess and modify interventions until short term weight is achieved;Long Term: Adherence to nutrition and physical activity/exercise program aimed toward attainment of established weight goal;Weight Maintenance: Understanding of the daily nutrition guidelines, which includes 25-35% calories from fat, 7% or less cal from saturated fats, less than 244m cholesterol, less than 1.5gm of sodium, & 5 or more servings of fruits and vegetables daily;Weight Gain: Understanding of general recommendations for a high calorie, high protein meal plan that promotes weight gain by distributing calorie intake throughout the day with the consumption for 4-5 meals, snacks, and/or supplements;Understanding of distribution of calorie intake throughout the day with the consumption of 4-5 meals/snacks;Understanding recommendations for meals to include 15-35% energy as protein, 25-35% energy from fat, 35-60% energy from carbohydrates, less than 2017mof dietary cholesterol, 20-35 gm of total fiber daily    Improve shortness of breath with ADL's Yes    Intervention  Provide education, individualized exercise plan and daily activity instruction to help decrease symptoms of SOB with activities of daily living.    Expected Outcomes Short Term: Improve cardiorespiratory fitness  to achieve a reduction of symptoms when performing ADLs;Long Term: Be able to perform more ADLs without symptoms or delay the onset of symptoms    Heart Failure Yes    Intervention Provide a combined exercise and nutrition program that is supplemented with education, support and counseling about heart failure. Directed toward relieving symptoms such as shortness of breath, decreased exercise tolerance, and extremity edema.    Expected Outcomes Improve functional capacity of life;Short term: Attendance in program 2-3 days a week with increased exercise capacity. Reported lower sodium intake. Reported increased fruit and vegetable intake. Reports medication compliance.;Short term: Daily weights obtained and reported for increase. Utilizing diuretic protocols set by physician.;Long term: Adoption of self-care skills and reduction of barriers for early signs and symptoms recognition and intervention leading to self-care maintenance.    Hypertension Yes    Intervention Provide education on lifestyle modifcations including regular physical activity/exercise, weight management, moderate sodium restriction and increased consumption of fresh fruit, vegetables, and low fat dairy, alcohol moderation, and smoking cessation.;Monitor prescription use compliance.    Expected Outcomes Short Term: Continued assessment and intervention until BP is < 140/79m HG in hypertensive participants. < 130/839mHG in hypertensive participants with diabetes, heart failure or chronic kidney disease.;Long Term: Maintenance of blood pressure at goal levels.    Lipids Yes    Intervention Provide education and support for participant on nutrition & aerobic/resistive exercise along with prescribed medications to achieve LDL <7020m HDL >83m13m  Expected Outcomes Short Term: Participant states understanding of desired cholesterol values and is compliant with medications prescribed. Participant is following exercise prescription and nutrition guidelines.;Long Term: Cholesterol controlled with medications as prescribed, with individualized exercise RX and with personalized nutrition plan. Value goals: LDL < 70mg16mL > 40 mg.             Education:Diabetes - Individual verbal and written instruction to review signs/symptoms of diabetes, desired ranges of glucose level fasting, after meals and with exercise. Acknowledge that pre and post exercise glucose checks will be done for 3 sessions at entry of program.   Know Your Numbers and Heart Failure: - Group verbal and visual instruction to discuss disease risk factors for cardiac and pulmonary disease and treatment options.  Reviews associated critical values for Overweight/Obesity, Hypertension, Cholesterol, and Diabetes.  Discusses basics of heart failure: signs/symptoms and treatments.  Introduces Heart Failure Zone chart for action plan for heart failure.  Written material given at graduation.   Core Components/Risk Factors/Patient Goals Review:    Core Components/Risk Factors/Patient Goals at Discharge (Final Review):    ITP Comments:  ITP Comments     Row Name 07/21/22 1421 08/22/22 1458 08/23/22 1418 09/06/22 1516 09/14/22 1125   ITP Comments Initial telephone orientation completed. [facility] be found [facility]where. EP orientation scheduled for Tuesday 8/22 at 10am. Completed 6MWT and gym orientation. Initial ITP created and sent for review to Dr. Fuad Ottie Glazierical Director. 30 Day review completed. Medical Director ITP review done, changes made as directed, and signed approval by Medical Director.    NEW [name] [name] has yet to start rehab due to transportation issues.  His daughter has had surgery and is unable to bring him and he did  not feel comfortable taking his oxygen on the bus. Attempted to call patient to follow up on Pulmonary Rehab and left message asking for call back. Has not been here since orientation on 9/5 due to transportation issues.    Row  Name 09/18/22 1557 09/20/22 0742 10/12/22 1555       ITP Comments [name] [name] returned our call.  His daughter got her plates removed today from her surgery and is hoping to being to drive again next.  They are also working on an alternative if she is unable to drive him.  He will keep [place] posted. 30 Day review completed. Medical Director ITP review done, changes made as directed, and signed approval by Medical Director.   Out for family medical No response from patient. Discharge at this time.              Comments: Discharge ITP

## 2022-10-16 ENCOUNTER — Ambulatory Visit: Payer: No Typology Code available for payment source

## 2022-10-17 ENCOUNTER — Ambulatory Visit: Payer: No Typology Code available for payment source

## 2022-10-19 ENCOUNTER — Ambulatory Visit: Payer: No Typology Code available for payment source

## 2022-10-23 ENCOUNTER — Ambulatory Visit: Payer: No Typology Code available for payment source

## 2022-10-24 ENCOUNTER — Ambulatory Visit: Payer: No Typology Code available for payment source

## 2022-10-26 ENCOUNTER — Ambulatory Visit: Payer: No Typology Code available for payment source

## 2022-10-30 ENCOUNTER — Ambulatory Visit: Payer: No Typology Code available for payment source

## 2022-10-31 ENCOUNTER — Ambulatory Visit: Payer: No Typology Code available for payment source

## 2022-11-02 ENCOUNTER — Ambulatory Visit: Payer: No Typology Code available for payment source

## 2022-11-06 ENCOUNTER — Ambulatory Visit: Payer: No Typology Code available for payment source

## 2022-11-07 ENCOUNTER — Ambulatory Visit: Payer: No Typology Code available for payment source

## 2022-11-13 ENCOUNTER — Ambulatory Visit: Payer: No Typology Code available for payment source

## 2022-11-14 ENCOUNTER — Ambulatory Visit: Payer: No Typology Code available for payment source

## 2022-11-16 ENCOUNTER — Ambulatory Visit: Payer: No Typology Code available for payment source

## 2022-11-20 ENCOUNTER — Ambulatory Visit: Payer: No Typology Code available for payment source

## 2022-11-21 ENCOUNTER — Ambulatory Visit: Payer: No Typology Code available for payment source

## 2022-12-01 ENCOUNTER — Telehealth: Payer: Self-pay | Admitting: Pulmonary Disease

## 2022-12-01 NOTE — Telephone Encounter (Signed)
PT states he was given 5 Pred tabs in Aug by Dr. Loanne Drilling. He would like another round. States he has the same issues and it worked well for him then. Please call to advise @ [phone]. TY

## 2022-12-05 NOTE — Telephone Encounter (Signed)
Spoke with the pt and scheduled video visit with [name] [name] for 12/07/22

## 2022-12-07 ENCOUNTER — Encounter (HOSPITAL_BASED_OUTPATIENT_CLINIC_OR_DEPARTMENT_OTHER): Payer: Self-pay | Admitting: Pulmonary Disease

## 2022-12-07 ENCOUNTER — Telehealth (INDEPENDENT_AMBULATORY_CARE_PROVIDER_SITE_OTHER): Payer: No Typology Code available for payment source | Admitting: Pulmonary Disease

## 2022-12-07 DIAGNOSIS — J9612 Chronic respiratory failure with hypercapnia: Secondary | ICD-10-CM | POA: Diagnosis not present

## 2022-12-07 DIAGNOSIS — I2722 Pulmonary hypertension due to left heart disease: Secondary | ICD-10-CM

## 2022-12-07 DIAGNOSIS — J432 Centrilobular emphysema: Secondary | ICD-10-CM

## 2022-12-07 DIAGNOSIS — J441 Chronic obstructive pulmonary disease with (acute) exacerbation: Secondary | ICD-10-CM

## 2022-12-07 DIAGNOSIS — J9611 Chronic respiratory failure with hypoxia: Secondary | ICD-10-CM

## 2022-12-07 MED ORDER — PREDNISONE 20 MG PO TABS: 20.0000 mg | ORAL_TABLET | Freq: Every day | ORAL | 0 refills | Status: DC

## 2022-12-07 NOTE — Progress Notes (Signed)
Virtual Visit via Video Note  I connected with [name] [name] on 12/07/22 at 10:45 AM EST by a video enabled telemedicine application and verified that I am speaking with the correct [name] using two identifiers.  Location: Patient: Home Provider: [facility] [place]   I discussed the limitations of evaluation and management by telemedicine and the availability of in [place] appointments. The patient expressed understanding and agreed to proceed.   I discussed the assessment and treatment plan with the patient. The patient was provided an opportunity to ask questions and all were answered. The patient agreed with the plan and demonstrated an understanding of the instructions.   The patient was advised to call back or seek an in[place] evaluation if the symptoms worsen or if the condition fails to improve as anticipated.  I provided 33 minutes of non-face-to-face time during this encounter.   Zakira Ressel Rodman Pickle, MD  Subjective:   PATIENT ID: [name] [name] GENDER: male DOB: 10-31-1951, MRN: [id number]   HPI  Chief Complaint  Patient presents with   Follow-up    sob   Reason for Visit: Follow-up  Mr. 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  He was recently hospitalized for acute hypoxemic and hypercarbic respiratory failure secondary to right heart failure. Right heart cath with mild-moderate PH.  Left pleural fluid likely chronic due to his pneumonectomy. No evidence of empyema on 9/2 pleural studies. For his chronic hypercarbic failure he was recommended for NIV prior to discharge. His respiratory failure is likely a sequelae of right pneumonectomy for squamous cell lung cancer resulting in right pulmonary hypertension and  right heart failure in the setting of chronic lung disease.   01/02/22 He reports he has been struggling to breath despite being on BiPAP. He will awaken at times from not breathing. He reports worsening nasal congestion since using oxygen and NIV. He reports headaches that began five days. Daughter presents for additional history. He seems to be gasping with speech despite compliance with oxygen. Denies coughing or wheezing. He is able to walk briskly but feels fatigued after activity.  04/10/22 Since our last visit, he is compliant with his O2. He is using 3-4L. Has not started pulmonary rehab. He has lost motivation after being denied for lung transplant. At baseline, he is walking around the house. He is compliant with his NIV. He reports difficulty tolerating the machine and will awake him at night.  07/24/22 Since our last visit he has been complaint with 3L O2. Pulmonary rehab orientation has been completed and scheduled 8/22. He reports worsening shortness of breath and sore throat that began yesterday. Sometimes this will progress but since it is early he is not sure. He is interested in [employer] device and previously qualified at last visit. Has been compliant with his NIV nightly and recent sleep study was negative for OSA, unfortunately TCO2 not available.  12/07/22 Since our last visit his daughter was diagnosed with cancer. Was unable to complete rehab due to home situation. He reports that his breathing is fair. He is planning to walk more. Scheduled with nutritionist at [facility] in January. Compliant with trilogy, new [employer] respironics trilogy device. Compliant with NIV however its uncomfortable. Uses portable oxygen. Has shortness of breath with exertion. Worsening productive cough with phlegm. No wheezing. Similar to his  prior exacerbations which was improved on low-dose steroids.  Social History: Former smoker  Past Medical History:  Diagnosis Date   Cancer ([place])    lung   CHF  (congestive heart failure) ([place])    Diabetes mellitus without complication ([place])    Dyspnea    Hyperlipidemia    Hypertension     No Known Allergies   Outpatient Medications Prior to Visit  Medication Sig Dispense Refill   acetaminophen (TYLENOL) 325 MG tablet Take 650 mg by mouth every 6 (six) hours as needed for headache or mild pain.     albuterol (VENTOLIN HFA) 108 (90 Base) MCG/ACT inhaler Inhale 1-2 puffs into the lungs every 6 (six) hours as needed.     ascorbic acid (VITAMIN C) 1000 MG tablet Take 1,000 mg by mouth daily.     aspirin 81 MG EC tablet Take 81 mg by mouth daily.     carvedilol (COREG) 3.125 MG tablet Take 1 tablet (3.125 mg total) by mouth 2 (two) times daily. 60 tablet 0   Cholecalciferol 10 MCG (400 UNIT) CAPS Take 400 Units by mouth daily.     fluticasone-salmeterol (WIXELA INHUB) 250-50 MCG/ACT AEPB Inhale 1 puff into the lungs in the morning and at bedtime. 180 each 3   lisinopril (ZESTRIL) 5 MG tablet Take 1 tablet (5 mg total) by mouth daily. 90 tablet 0   Multiple Vitamin (MULTIVITAMIN) capsule Take 1 capsule by mouth daily.     Multiple Vitamins-Minerals (THERA-M) TABS Take 1 tablet by mouth daily.     pantoprazole (PROTONIX) 40 MG tablet Take 40 mg by mouth daily.     senna-docusate (SENOKOT-S) 8.6-50 MG tablet Take 1 tablet by mouth at bedtime as needed for mild constipation. 20 tablet 0   simvastatin (ZOCOR) 20 MG tablet Take 10 mg by mouth daily.     predniSONE (DELTASONE) 20 MG tablet Take 1 tablet (20 mg total) by mouth daily with breakfast. 5 tablet 0   furosemide (LASIX) 40 MG tablet Take 1 tablet (40 mg total) by mouth daily for 7 days. 30 tablet 0   Tiotropium Bromide Monohydrate (SPIRIVA RESPIMAT) 2.5 MCG/ACT AERS Inhale 2 puffs into the lungs daily. 12 g 3   No facility-administered medications prior to visit.    Review of Systems  Constitutional:  Negative for chills, diaphoresis, fever, malaise/fatigue and weight loss.  HENT:  Negative for  congestion.   Respiratory:  Positive for shortness of breath. Negative for cough, hemoptysis, sputum production and wheezing.   Cardiovascular:  Negative for chest pain, palpitations and leg swelling.     Objective:   There were no vitals filed for this visit.    Physical Exam: General: Thin-appearing, no acute distress HENT: Downs, AT Eyes: EOMI, no scleral icterus Respiratory: No respiratory distress Neuro: AAO x4, CNII-XII grossly intact Psych: Normal mood, normal affect  Data Reviewed:  Imaging: CXR 08/26/21 S/p left pneumonectomy. Complete opacification of left hemithorax. Pleural based calcifications. No infiltrate effusion or edema.  PFT: [facility] 10/31/21  FVC 1.17 (30%) FEV1 0.42 (14%) Ratio 36  TLC 62% DLCO 7.8% Interpretation: Very severe obstructive and restrictive defect with severely reduced gas exchange  Labs: CBC    Component Value Date/Time   WBC 4.3 09/15/2021 0246   RBC 3.39 (L) 09/15/2021 0246   HGB 9.5 (L) 09/15/2021 0246   HGB 12.2 (L) 09/26/2014 0431   HCT 31.0 (L) 09/15/2021 0246   HCT 36.9 (L) 09/26/2014 0431   PLT 218 09/15/2021 0246  PLT 199 09/26/2014 0431   MCV 91.4 09/15/2021 0246   MCV 88 09/26/2014 0431   MCH 28.0 09/15/2021 0246   MCHC 30.6 09/15/2021 0246   RDW 13.0 09/15/2021 0246   RDW 13.1 09/26/2014 0431   LYMPHSABS 0.9 09/05/2021 0926   LYMPHSABS 1.9 09/26/2014 0431   MONOABS 0.4 09/05/2021 0926   MONOABS 0.7 09/26/2014 0431   EOSABS 0.1 09/05/2021 0926   EOSABS 0.2 09/26/2014 0431   BASOSABS 0.0 09/05/2021 0926   BASOSABS 0.1 09/26/2014 0431   Absolute eosinophil  09/26/2014-200 09/05/2021-100  ABG    Component Value Date/Time   PHART 7.340 (L) 01/23/2022 1140   PCO2ART 71.3 (HH) 01/23/2022 1140   PO2ART 233 (H) 01/23/2022 1140   HCO3 37.4 (H) 01/23/2022 1140   TCO2 43 (H) 08/24/2021 1234   TCO2 45 (H) 08/24/2021 1234   O2SAT 99.4 01/23/2022 1140   SD Card Compliance Usage Days 09/15/21-11/13/21 - 53/59 days  (89%) >4 hours - 85% Average Usage - 5 hours 21 min Average AHI 1.4    Assessment & Plan:   Discussion: 71 year old male with multiple co-morbidities including squamous cell lung cancer c/p left pneumonectomy with chronic pleural effusion, emphysema, moderate pulmonary hypertension WHO Group III who presents for follow-up. Currently in mild COPD exacerbation. Unable to complete pulm rehab due to family health. Compliant with regimen as noted below  COPD exacerbation --Prednisone 20 mg x 5 days  Chronic hypoxemic and hypercapneic respiratory failure  --SpO2 goal >88% --CONTINUE supplemental O2 with 3L on exertion. Has PCO --CONTINUE non-invasive ventilation nightly  --[facility] sleep study negative for OSA. Demonstrated nocturnal hypoxemia --Will bring [employer] to nex visit Consider titration of nocturnal vent from TV 4-6 with increased PS max   Severe emphysema  --CONTINUE Spiriva 2.5 TWO puffs ONCE a day --CONTINUE Wixela 250 ONE puff TWICE a day --Consider re-enrollment in pulm rehab in the future if desired  Mild-moderate pulmonary hypertension, WHO group III --Not a candidate for selective pulmonary artery vasodilators  Health Maintenance Immunization History  Administered Date(s) Administered   Covid-19, Mrna,Vaccine(Spikevax)40yr and older 10/16/2022   Fluad Quad(high Dose 65+) 09/09/2021   Influenza, High Dose Seasonal PF 10/07/2019, 10/16/2022   Influenza, Seasonal, Injecte, Preservative Fre 03/15/2010   Influenza-Unspecified 08/23/2016, 09/09/2021   Tdap 09/27/2021   Zoster Recombinat (Shingrix) 09/27/2021, 01/03/2022   CT Lung Screen - not qualified  No orders of the defined types were placed in this encounter.  Meds ordered this encounter  Medications   predniSONE (DELTASONE) 20 MG tablet    Sig: Take 1 tablet (20 mg total) by mouth daily with breakfast.    Dispense:  5 tablet    Refill:  0    Return in about 1 month (around 01/07/2023).  I  have spent a total time of 33-minutes on the day of the appointment including chart review, data review, collecting history, coordinating care and discussing medical diagnosis and plan with the patient/family. Past medical history, allergies, medications were reviewed. Pertinent imaging, labs and tests included in this note have been reviewed and interpreted independently by me.  CMantee MD [facility] 12/07/2022 12:52 PM  Office Number [phone]

## 2023-01-01 ENCOUNTER — Ambulatory Visit (HOSPITAL_BASED_OUTPATIENT_CLINIC_OR_DEPARTMENT_OTHER): Payer: No Typology Code available for payment source | Admitting: Pulmonary Disease

## 2023-01-19 ENCOUNTER — Telehealth (HOSPITAL_BASED_OUTPATIENT_CLINIC_OR_DEPARTMENT_OTHER): Payer: Self-pay | Admitting: Pulmonary Disease

## 2023-01-24 NOTE — Telephone Encounter (Signed)
ATC x1 left message to call office

## 2023-02-06 NOTE — Telephone Encounter (Signed)
Tried calling the pt and there was no answer- LMTCB and will close per protocol

## 2023-02-14 ENCOUNTER — Ambulatory Visit (HOSPITAL_BASED_OUTPATIENT_CLINIC_OR_DEPARTMENT_OTHER): Payer: No Typology Code available for payment source | Admitting: Pulmonary Disease

## 2023-02-19 ENCOUNTER — Telehealth (HOSPITAL_BASED_OUTPATIENT_CLINIC_OR_DEPARTMENT_OTHER): Payer: Self-pay | Admitting: Pulmonary Disease

## 2023-02-19 NOTE — Telephone Encounter (Signed)
Prior to pt appt at 11AM pt called the office and stated that he would be 30 mins late and I told pt we would not be able to see him and offered 2:30pm and 4pm blocked spots on the same day but denied. Also I stated if made it by 11:15 we could see him. Pt stated he would be here by then but showed up at 11:22 and was not able to be seen. I told him we would have to reschedule and gave him a 2:30 pm appt option today, blocked spots Thursday as well as Friday and pt declined them all. Pt gave no verbal confirmation that he wanted to be seen at 2:30 instead. Pt showed up at 2:20 saying that he has an appt and explained to pt that time would no longer work since a pt was added to Dr. Rosario Adie 2:30 spot as well as 4pm spot since no verbal confirmation. Again offered times for the next two days pt declined and left. Will attempt to reschedule next week.

## 2023-03-23 ENCOUNTER — Encounter (HOSPITAL_BASED_OUTPATIENT_CLINIC_OR_DEPARTMENT_OTHER): Payer: Self-pay | Admitting: Pulmonary Disease

## 2023-03-23 DIAGNOSIS — J432 Centrilobular emphysema: Secondary | ICD-10-CM

## 2023-03-23 DIAGNOSIS — J9612 Chronic respiratory failure with hypercapnia: Secondary | ICD-10-CM

## 2023-04-05 ENCOUNTER — Ambulatory Visit (HOSPITAL_BASED_OUTPATIENT_CLINIC_OR_DEPARTMENT_OTHER): Payer: No Typology Code available for payment source | Admitting: Pulmonary Disease

## 2023-04-19 ENCOUNTER — Encounter (HOSPITAL_BASED_OUTPATIENT_CLINIC_OR_DEPARTMENT_OTHER): Payer: Self-pay | Admitting: Pulmonary Disease

## 2023-04-19 ENCOUNTER — Ambulatory Visit (INDEPENDENT_AMBULATORY_CARE_PROVIDER_SITE_OTHER): Payer: Medicare Other | Admitting: Pulmonary Disease

## 2023-04-19 VITALS — BP 116/74 | HR 87 | Temp 98.5°F | Ht 69.0 in | Wt 129.2 lb

## 2023-04-19 DIAGNOSIS — J432 Centrilobular emphysema: Secondary | ICD-10-CM | POA: Diagnosis not present

## 2023-04-19 DIAGNOSIS — J9612 Chronic respiratory failure with hypercapnia: Secondary | ICD-10-CM | POA: Diagnosis not present

## 2023-04-19 NOTE — Progress Notes (Signed)
Subjective:   PATIENT ID: [name] [name] GENDER: male DOB: August 12, 1951, MRN: [id number]   HPI  Chief Complaint  Patient presents with   Follow-up    Vent support   Reason for Visit: Follow-up  [name] is a 72 year old male prior tobacco abuse (reported 10 pack years) with emphysema, mild/mod pulmonary hypertension, history of squamous cell lung cancer status postpneumonectomy in 2005 and chemotherapy, hyperlipidemia, hypertension and type 2 diabetes who presents for follow-up.  He was recently hospitalized for acute hypoxemic and hypercarbic respiratory failure secondary to right heart failure. Right heart cath with mild-moderate PH.  Left pleural fluid likely chronic due to his pneumonectomy. No evidence of empyema on 9/2 pleural studies. For his chronic hypercarbic failure he was recommended for NIV prior to discharge. His respiratory failure is likely a sequelae of right pneumonectomy for squamous cell lung cancer resulting in right pulmonary hypertension and right heart failure in the setting of chronic lung disease.   01/02/22 He reports he has been struggling to breath despite being on BiPAP. He will awaken at times from not breathing. He reports worsening nasal congestion since using oxygen and NIV. He reports headaches that began five days. Daughter presents for additional history. He seems to be gasping with speech despite compliance with oxygen. Denies coughing or wheezing. He is able to walk briskly but feels fatigued after activity.  04/10/22 Since our last visit, he is compliant with his O2. He is using 3-4L. Has not started pulmonary rehab. He has lost motivation after being denied for lung transplant. At baseline, he is walking around the house. He is compliant with his NIV. He reports difficulty tolerating the machine and will awake him at night.  07/24/22 Since our last visit he has been complaint with 3L O2. Pulmonary rehab orientation has been completed and  scheduled 8/22. He reports worsening shortness of breath and sore throat that began yesterday. Sometimes this will progress but since it is early he is not sure. He is interested in [facility] device and previously qualified at last visit. Has been compliant with his NIV nightly and recent sleep study was negative for OSA, unfortunately TCO2 not available.  12/07/22 Since our last visit his daughter was diagnosed with cancer. Was unable to complete rehab due to home situation. He reports that his breathing is fair. He is planning to walk more. Scheduled with nutritionist at [facility] in January. Compliant with trilogy, [employer] trilogy device. Compliant with NIV however its uncomfortable. Uses portable oxygen. Has shortness of breath with exertion. Worsening productive cough with phlegm. No wheezing. Similar to his prior exacerbations which was improved on low-dose steroids.  04/19/23 He reports his shortness of breath is unchanged. He is compliant with Wixela and Spiriva. He is not sleeping as well and staying up late at night until early morning. Sleeping late and not eating regularly due to this. He is struggling with his Trilogy. Reports alarms due to low inspiratory pressure. Compliant with his oxygen.   Social History: Former smoker  Past Medical History:  Diagnosis Date   Cancer (HCC)    lung   CHF (congestive heart failure) (HCC)    Diabetes mellitus without complication (HCC)    Dyspnea    Hyperlipidemia    Hypertension     No Known Allergies   Outpatient Medications Prior to Visit  Medication Sig Dispense Refill   acetaminophen (TYLENOL) 325 MG tablet Take 650 mg by mouth every 6 (six) hours as needed for headache  or mild pain.     albuterol (VENTOLIN HFA) 108 (90 Base) MCG/ACT inhaler Inhale 1-2 puffs into the lungs every 6 (six) hours as needed.     ascorbic acid (VITAMIN C) 1000 MG tablet Take 1,000 mg by mouth daily.     aspirin 81 MG EC tablet Take 81 mg by mouth daily.      carvedilol (COREG) 3.125 MG tablet Take 1 tablet (3.125 mg total) by mouth 2 (two) times daily. 60 tablet 0   Cholecalciferol 10 MCG (400 UNIT) CAPS Take 400 Units by mouth daily.     lisinopril (ZESTRIL) 5 MG tablet Take 1 tablet (5 mg total) by mouth daily. 90 tablet 0   Multiple Vitamin (MULTIVITAMIN) capsule Take 1 capsule by mouth daily.     Multiple Vitamins-Minerals (THERA-M) TABS Take 1 tablet by mouth daily.     pantoprazole (PROTONIX) 40 MG tablet Take 40 mg by mouth daily.     predniSONE (DELTASONE) 20 MG tablet Take 1 tablet (20 mg total) by mouth daily with breakfast. 5 tablet 0   senna-docusate (SENOKOT-S) 8.6-50 MG tablet Take 1 tablet by mouth at bedtime as needed for mild constipation. 20 tablet 0   simvastatin (ZOCOR) 20 MG tablet Take 10 mg by mouth daily.     fluticasone-salmeterol (WIXELA INHUB) 250-50 MCG/ACT AEPB Inhale 1 puff into the lungs in the morning and at bedtime. 180 each 3   furosemide (LASIX) 40 MG tablet Take 1 tablet (40 mg total) by mouth daily for 7 days. 30 tablet 0   Tiotropium Bromide Monohydrate (SPIRIVA RESPIMAT) 2.5 MCG/ACT AERS Inhale 2 puffs into the lungs daily. 12 g 3   No facility-administered medications prior to visit.    Review of Systems  Constitutional:  Negative for chills, diaphoresis, fever, malaise/fatigue and weight loss.  HENT:  Negative for congestion.   Respiratory:  Positive for shortness of breath. Negative for cough, hemoptysis, sputum production and wheezing.   Cardiovascular:  Negative for chest pain, palpitations and leg swelling.     Objective:   Vitals:   04/19/23 1313  BP: 116/74  Pulse: 87  Temp: 98.5 F (36.9 C)  TempSrc: Oral  SpO2: 96%  Weight: 129 lb 3.2 oz (58.6 kg)  Height: 5\' 9"  (1.753 m)   SpO2: 96 % O2 Device: Nasal cannula O2 Flow Rate (L/min): 3 L/min O2 Type: Pulse O2  Physical Exam: General: Well-appearing, no acute distress HENT: [place], AT Eyes: EOMI, no scleral icterus Respiratory:  Absent left air entry. Clear to auscultation bilaterally.  No crackles, wheezing or rales Cardiovascular: RRR, -M/R/G, no JVD Extremities:-Edema,-tenderness Neuro: AAO x4, CNII-XII grossly intact Psych: Normal mood, normal affect  Data Reviewed:  Imaging: CXR 08/26/21 S/p left pneumonectomy. Complete opacification of left hemithorax. Pleural based calcifications. No infiltrate effusion or edema.  PFT: [facility] 10/31/21  FVC 1.17 (30%) FEV1 0.42 (14%) Ratio 36  TLC 62% DLCO 7.8% Interpretation: Very severe obstructive and restrictive defect with severely reduced gas exchange  Labs: CBC    Component Value Date/Time   WBC 4.3 09/15/2021 0246   RBC 3.39 (L) 09/15/2021 0246   HGB 9.5 (L) 09/15/2021 0246   HGB 12.2 (L) 09/26/2014 0431   HCT 31.0 (L) 09/15/2021 0246   HCT 36.9 (L) 09/26/2014 0431   PLT 218 09/15/2021 0246   PLT 199 09/26/2014 0431   MCV 91.4 09/15/2021 0246   MCV 88 09/26/2014 0431   MCH 28.0 09/15/2021 0246   MCHC 30.6 09/15/2021 0246  RDW 13.0 09/15/2021 0246   RDW 13.1 09/26/2014 0431   LYMPHSABS 0.9 09/05/2021 0926   LYMPHSABS 1.9 09/26/2014 0431   MONOABS 0.4 09/05/2021 0926   MONOABS 0.7 09/26/2014 0431   EOSABS 0.1 09/05/2021 0926   EOSABS 0.2 09/26/2014 0431   BASOSABS 0.0 09/05/2021 0926   BASOSABS 0.1 09/26/2014 0431   Absolute eosinophil  09/26/2014-200 09/05/2021-100  ABG    Component Value Date/Time   PHART 7.340 (L) 01/23/2022 1140   PCO2ART 71.3 (HH) 01/23/2022 1140   PO2ART 233 (H) 01/23/2022 1140   HCO3 37.4 (H) 01/23/2022 1140   TCO2 43 (H) 08/24/2021 1234   TCO2 45 (H) 08/24/2021 1234   O2SAT 99.4 01/23/2022 1140   SD Card Compliance Usage Days 09/15/21-11/13/21 - 53/59 days (89%) >4 hours - 85% Average Usage - 5 hours 21 min Average AHI 1.4    Assessment & Plan:   Discussion: 72 year old male with multiple co-morbidities including squamous cell lung cancer s/p left pneumonectomy with chronic pleural effusion, emphysema,  moderate pulmonary HTN WHO group III who presents for follow-up. Shortness of breath but not in exacerbation. Difficulty tolerating ventilator. Reduced TV to 430 cc  Prior vent settings AVAPS-AE TV 600 ml >>> 430 cc Max Pressure 32 cm H20 EPAP min/Max 4/20 PS Min/Max 4/20 Inspiratory 1.4 sec HR 12 BMP  Chronic hypoxemic and hypercapneic respiratory failure  --SpO2 goal >88% --CONTINUE supplemental O2 with 3L on exertion. Has POC --CONTINUE non-invasive ventilation nightly with new tidal volume as noted above --[facility] sleep study negative for OSA. Demonstrated nocturnal hypoxemia --Reviewed [employer] setting. [facility] able [facility] to assist with instructions for vent adjustment  Severe emphysema  --CONTINUE Spiriva 2.5 TWO puffs ONCE a day --CONTINUE Wixela 250 ONE puff TWICE a day --Consider re-enrollment in pulm rehab in the future if desired  Mild-moderate pulmonary hypertension, WHO group III --Not a candidate for selective pulmonary artery vasodilators  Health Maintenance Immunization History  Administered Date(s) Administered   Covid-19, Mrna,Vaccine(Spikevax)42yrs and older 10/16/2022   Fluad Quad(high Dose 65+) 09/09/2021   Influenza, High Dose Seasonal PF 10/07/2019, 10/16/2022   Influenza, Seasonal, Injecte, Preservative Fre 03/15/2010   Influenza-Unspecified 08/23/2016, 09/09/2021   Moderna Sars-Covid-2 Vaccination 01/28/2020, 02/25/2020, 10/19/2020   Pneumococcal Conjugate-13 10/07/2019   Pneumococcal Polysaccharide-23 05/24/2016   Rsv, Bivalent, Protein Subunit Rsvpref,pf Verdis Frederickson) 01/22/2023   Tdap 09/27/2021   Zoster Recombinat (Shingrix) 09/27/2021, 01/03/2022   CT Lung Screen - not qualified  No orders of the defined types were placed in this encounter.  No orders of the defined types were placed in this encounter.   Return in about 3 months (around 07/20/2023) for for 30 min slot.  I have spent a total time of 39-minutes on the day  of the appointment including chart review, data review, collecting history, coordinating care and discussing medical diagnosis and plan with the patient/family. Past medical history, allergies, medications were reviewed. Pertinent imaging, labs and tests included in this note have been reviewed and interpreted independently by me.  Nashay Brickley Mechele Collin, MD [facility] 04/19/2023 1:30 PM  Office Number [phone]

## 2023-04-19 NOTE — Patient Instructions (Signed)
Chronic hypoxemic and hypercapneic respiratory failure  --SpO2 goal >88% --CONTINUE supplemental O2 with 3L on exertion. Has PCO --CONTINUE non-invasive ventilation nightly  --[facility] sleep study negative for OSA. Demonstrated nocturnal hypoxemia --Reviewed [employer] setting. Advised patient to contact [place] who adjusts vent settings at the [place]. Plan of nocturnal vent from TV 4-6 with increased PS max   Severe emphysema  --CONTINUE Spiriva 2.5 TWO puffs ONCE a day --CONTINUE Wixela 250 ONE puff TWICE a day --Consider re-enrollment in pulm rehab in the future if desired

## 2023-04-23 ENCOUNTER — Encounter (HOSPITAL_BASED_OUTPATIENT_CLINIC_OR_DEPARTMENT_OTHER): Payer: Self-pay | Admitting: Pulmonary Disease

## 2023-06-13 ENCOUNTER — Telehealth: Payer: Self-pay | Admitting: Pulmonary Disease

## 2023-06-13 NOTE — Telephone Encounter (Signed)
Calling in regards to written oxygen order that was faxed on 06/01/2023  Fax: [phone] Ref#: [id number]

## 2023-06-19 NOTE — Telephone Encounter (Signed)
Contacted [facility] as script was recently faxed last week. [facility] staff reviewed chart and no issues. Will close encounter.

## 2023-07-09 ENCOUNTER — Telehealth: Payer: Medicare Other | Admitting: Nurse Practitioner

## 2023-07-09 DIAGNOSIS — J449 Chronic obstructive pulmonary disease, unspecified: Secondary | ICD-10-CM | POA: Diagnosis not present

## 2023-07-09 NOTE — Progress Notes (Signed)
Virtual Visit Consent   [name], you are scheduled for a virtual visit with a [facility] provider today. Just as with appointments in the office, your consent must be obtained to participate. Your consent will be active for this visit and any virtual visit you may have with one of our providers in the next 365 days. If you have a MyChart account, a copy of this consent can be sent to you electronically.  As this is a virtual visit, video technology does not allow for your provider to perform a traditional examination. This may limit your provider's ability to fully assess your condition. If your provider identifies any concerns that need to be evaluated in [place] or the need to arrange testing (such as labs, EKG, etc.), we will make arrangements to do so. Although advances in technology are sophisticated, we cannot ensure that it will always work on either your end or our end. If the connection with a video visit is poor, the visit may have to be switched to a telephone visit. With either a video or telephone visit, we are not always able to ensure that we have a secure connection.  By engaging in this virtual visit, you consent to the provision of healthcare and authorize for your insurance to be billed (if applicable) for the services provided during this visit. Depending on your insurance coverage, you may receive a charge related to this service.  I need to obtain your verbal consent now. Are you willing to proceed with your visit today? [name] [name] has provided verbal consent on 07/09/2023 for a virtual visit (video or telephone). [name], FNP  Date: 07/09/2023 6:53 PM  Virtual Visit via Video Note   I, [name], connected with  [name] [name]  ([id number], 11-Jul-1951) on 07/09/23 at  7:00 PM EDT by a video-enabled telemedicine application and verified that I am speaking with the correct [name] using two identifiers.  Location: Patient: Virtual Visit Location Patient:  Home Provider: Virtual Visit Location Provider: Home Office   I discussed the limitations of evaluation and management by telemedicine and the availability of in [place] appointments. The patient expressed understanding and agreed to proceed.    History of Present Illness: [name] [name] is a 72 y.o. who identifies as a male who was assigned male at birth, and is being seen today for questions on medications.   He has a stand by prescription of Prednisone of 20mg  daily with breakfast to help with management of Chronic obstructive lung disease.   The patient has missed the medicine the last few mornings and is curious if he can the medicine at different times during the day, or if he needs to take it in the morning.      Problems:  Patient Active Problem List   Diagnosis Date Noted   Chronic obstructive lung disease (HCC) 07/21/2022   Gastroesophageal reflux disease 07/21/2022   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) 07/21/2022   Epigastric abdominal pain    Centrilobular emphysema (HCC)    Protein-calorie malnutrition, severe 09/14/2021   Pleural effusion 08/20/2021   Chronic respiratory failure with hypoxia and hypercapnia (HCC)    Right-sided heart failure (HCC) 08/19/2021   Irregular heart beats 01/06/2020   Hypertension, benign 12/30/2019   Lung cancer (HCC) 09/02/2013    Allergies: No Known Allergies Medications:  Current Outpatient Medications:    acetaminophen (TYLENOL) 325 MG tablet, Take 650 mg by mouth every 6 (six) hours as needed for headache or mild  pain., Disp: , Rfl:    albuterol (VENTOLIN HFA) 108 (90 Base) MCG/ACT inhaler, Inhale 1-2 puffs into the lungs every 6 (six) hours as needed., Disp: , Rfl:    ascorbic acid (VITAMIN C) 1000 MG tablet, Take 1,000 mg by mouth daily., Disp: , Rfl:    aspirin 81 MG EC tablet, Take 81 mg by mouth daily., Disp: , Rfl:    carvedilol (COREG) 3.125 MG tablet, Take 1 tablet (3.125 mg total) by mouth 2 (two)  times daily., Disp: 60 tablet, Rfl: 0   Cholecalciferol 10 MCG (400 UNIT) CAPS, Take 400 Units by mouth daily., Disp: , Rfl:    fluticasone-salmeterol (WIXELA INHUB) 250-50 MCG/ACT AEPB, Inhale 1 puff into the lungs in the morning and at bedtime., Disp: 180 each, Rfl: 3   furosemide (LASIX) 40 MG tablet, Take 1 tablet (40 mg total) by mouth daily for 7 days., Disp: 30 tablet, Rfl: 0   lisinopril (ZESTRIL) 5 MG tablet, Take 1 tablet (5 mg total) by mouth daily., Disp: 90 tablet, Rfl: 0   Multiple Vitamin (MULTIVITAMIN) capsule, Take 1 capsule by mouth daily., Disp: , Rfl:    Multiple Vitamins-Minerals (THERA-M) TABS, Take 1 tablet by mouth daily., Disp: , Rfl:    pantoprazole (PROTONIX) 40 MG tablet, Take 40 mg by mouth daily., Disp: , Rfl:    senna-docusate (SENOKOT-S) 8.6-50 MG tablet, Take 1 tablet by mouth at bedtime as needed for mild constipation., Disp: 20 tablet, Rfl: 0   simvastatin (ZOCOR) 20 MG tablet, Take 10 mg by mouth daily., Disp: , Rfl:    Tiotropium Bromide Monohydrate (SPIRIVA RESPIMAT) 2.5 MCG/ACT AERS, Inhale 2 puffs into the lungs daily., Disp: 12 g, Rfl: 3  Observations/Objective: Patient is well-developed, well-nourished in no acute distress.  Resting comfortably  at home.  Head is normocephalic, atraumatic.  No labored breathing.  Speech is clear and coherent with logical content.  Patient is alert and oriented at baseline.    Assessment and Plan: 1. Chronic obstructive pulmonary disease, unspecified COPD type (HCC) Instructed patient he may take the prednisone with any meal, preferably in the morning- but that if he forgets he may take it with lunch or dinner   Follow up as needed     Follow Up Instructions: I discussed the assessment and treatment plan with the patient. The patient was provided an opportunity to ask questions and all were answered. The patient agreed with the plan and demonstrated an understanding of the instructions.  A copy of instructions  were sent to the patient via MyChart unless otherwise noted below.    The patient was advised to call back or seek an in[place] evaluation if the symptoms worsen or if the condition fails to improve as anticipated.  Time:  I spent 5 minutes with the patient via telehealth technology discussing the above problems/concerns.    [name], FNP

## 2023-08-05 ENCOUNTER — Encounter (HOSPITAL_BASED_OUTPATIENT_CLINIC_OR_DEPARTMENT_OTHER): Payer: Self-pay | Admitting: Pulmonary Disease

## 2023-08-06 NOTE — Telephone Encounter (Signed)
Called patient and clarified that third dose was taken early in the morning. Ok to take 1 puff in the evening.  Reports he is doing fairly on his current home vent settings. Having some stomach issues. ?Possible reflux related aspiration that leads him to be SOB sometimes. Requesting sooner appointment.  Closing encounter.

## 2023-09-25 ENCOUNTER — Telehealth: Payer: Self-pay | Admitting: Pulmonary Disease

## 2023-09-25 MED ORDER — PREDNISONE 20 MG PO TABS: 20.0000 mg | ORAL_TABLET | Freq: Every day | ORAL | 0 refills | Status: AC

## 2023-09-25 NOTE — Telephone Encounter (Signed)
Patient states needs refill for cough RX. Pharmacy is [facility] [place]. Also patient would like to speak to Dr. Everardo All. Patient phone number is [phone].

## 2023-09-25 NOTE — Telephone Encounter (Signed)
He reports irritation with a chemical in the home for over the month. He also reports mold in the attic from a leaking roof. He was treated with "allergy" pills by his PCP. He reports hoarseness with long periods of talking and activity. Coughing is his primary symptom with production of sputum. Associated with nasal congestion. Is taking loratidine every now and then.  COPD exacerbation Prednisone 20 mg x 5 days Recommend flonase daily

## 2023-09-25 NOTE — Telephone Encounter (Signed)
Called and spoke to patient. He is requesting prednisone.  C/o prod cough with brown sputum, runny nose, throat clearing and increased SOB with exertion x3d. Denied f/c/s or additional sx. No recent covid test.  He is using spiriva once daily and albuterol PRN(once or twice per week).  Dr. Everardo All, please advise. Thanks

## 2023-10-19 ENCOUNTER — Telehealth: Payer: Self-pay | Admitting: Adult Health

## 2023-10-19 NOTE — Telephone Encounter (Signed)
After clinic call.  Patient filled his prescription for prednisone that was called in on September 25, 2023.  Wanted to know if it was okay to take his prednisone later in the day instead of first thing in the morning.  I explained to him it would be better to take first thing in the morning with food.  Patient verbalized understanding. Advised to continue with Dr. George Hugh recommendations and follow-up in the office

## 2023-11-27 ENCOUNTER — Encounter (HOSPITAL_BASED_OUTPATIENT_CLINIC_OR_DEPARTMENT_OTHER): Payer: Self-pay | Admitting: Pulmonary Disease

## 2023-12-10 ENCOUNTER — Telehealth (HOSPITAL_BASED_OUTPATIENT_CLINIC_OR_DEPARTMENT_OTHER): Payer: Self-pay | Admitting: Pulmonary Disease

## 2023-12-10 ENCOUNTER — Ambulatory Visit (HOSPITAL_BASED_OUTPATIENT_CLINIC_OR_DEPARTMENT_OTHER): Payer: No Typology Code available for payment source | Admitting: Pulmonary Disease

## 2023-12-10 MED ORDER — PREDNISONE 20 MG PO TABS: 40.0000 mg | ORAL_TABLET | Freq: Every day | ORAL | 0 refills | Status: AC

## 2023-12-10 NOTE — Telephone Encounter (Signed)
Patient unable to make to appointment today due transportation.  Compliant with Wixela and Spiriva Has productive cough and hoarseness. He has been more fatigued lately. Not exercising like he should.   Rx prednisone 40 mg x 5 days for symptoms Patient requesting CXR or CT at next visit.

## 2024-01-04 ENCOUNTER — Encounter (HOSPITAL_BASED_OUTPATIENT_CLINIC_OR_DEPARTMENT_OTHER): Payer: Self-pay | Admitting: Pulmonary Disease

## 2024-01-07 ENCOUNTER — Ambulatory Visit (HOSPITAL_BASED_OUTPATIENT_CLINIC_OR_DEPARTMENT_OTHER): Payer: No Typology Code available for payment source | Admitting: Pulmonary Disease

## 2024-01-07 ENCOUNTER — Telehealth: Payer: Self-pay | Admitting: Pulmonary Disease

## 2024-01-07 ENCOUNTER — Ambulatory Visit (HOSPITAL_BASED_OUTPATIENT_CLINIC_OR_DEPARTMENT_OTHER): Payer: Medicare Other | Admitting: Pulmonary Disease

## 2024-01-07 ENCOUNTER — Ambulatory Visit (INDEPENDENT_AMBULATORY_CARE_PROVIDER_SITE_OTHER): Payer: No Typology Code available for payment source

## 2024-01-07 ENCOUNTER — Encounter (HOSPITAL_BASED_OUTPATIENT_CLINIC_OR_DEPARTMENT_OTHER): Payer: Self-pay | Admitting: Pulmonary Disease

## 2024-01-07 VITALS — BP 143/88 | HR 92 | Ht 69.0 in | Wt 123.0 lb

## 2024-01-07 DIAGNOSIS — R0602 Shortness of breath: Secondary | ICD-10-CM

## 2024-01-07 DIAGNOSIS — J9611 Chronic respiratory failure with hypoxia: Secondary | ICD-10-CM | POA: Diagnosis not present

## 2024-01-07 DIAGNOSIS — N529 Male erectile dysfunction, unspecified: Secondary | ICD-10-CM | POA: Insufficient documentation

## 2024-01-07 DIAGNOSIS — Z23 Encounter for immunization: Secondary | ICD-10-CM | POA: Insufficient documentation

## 2024-01-07 DIAGNOSIS — I272 Pulmonary hypertension, unspecified: Secondary | ICD-10-CM

## 2024-01-07 DIAGNOSIS — F341 Dysthymic disorder: Secondary | ICD-10-CM | POA: Insufficient documentation

## 2024-01-07 DIAGNOSIS — R636 Underweight: Secondary | ICD-10-CM | POA: Insufficient documentation

## 2024-01-07 DIAGNOSIS — J449 Chronic obstructive pulmonary disease, unspecified: Secondary | ICD-10-CM | POA: Diagnosis not present

## 2024-01-07 DIAGNOSIS — F4312 Post-traumatic stress disorder, chronic: Secondary | ICD-10-CM | POA: Insufficient documentation

## 2024-01-07 DIAGNOSIS — J9612 Chronic respiratory failure with hypercapnia: Secondary | ICD-10-CM

## 2024-01-07 DIAGNOSIS — H524 Presbyopia: Secondary | ICD-10-CM | POA: Insufficient documentation

## 2024-01-07 DIAGNOSIS — N400 Enlarged prostate without lower urinary tract symptoms: Secondary | ICD-10-CM | POA: Insufficient documentation

## 2024-01-07 DIAGNOSIS — E1042 Type 1 diabetes mellitus with diabetic polyneuropathy: Secondary | ICD-10-CM | POA: Insufficient documentation

## 2024-01-07 NOTE — Telephone Encounter (Signed)
Patient aware of results.

## 2024-01-07 NOTE — Progress Notes (Addendum)
Subjective:   PATIENT ID: [name] [name] GENDER: male DOB: 07/13/1951, MRN: [id number]   HPI  Chief Complaint  Patient presents with   Follow-up    Questions about home heating   Reason for Visit: Follow-up  [name] is a 73 year old male prior tobacco abuse (reported 10 pack years) with emphysema, mild/mod pulmonary hypertension, history of squamous cell lung cancer status postpneumonectomy in 2005 and chemotherapy, hyperlipidemia, hypertension and type 2 diabetes who presents for follow-up.  He was recently hospitalized for acute hypoxemic and hypercarbic respiratory failure secondary to right heart failure. Right heart cath with mild-moderate PH.  Left pleural fluid likely chronic due to his pneumonectomy. No evidence of empyema on 9/2 pleural studies. For his chronic hypercarbic failure he was recommended for NIV prior to discharge. His respiratory failure is likely a sequelae of right pneumonectomy for squamous cell lung cancer resulting in right pulmonary hypertension and right heart failure in the setting of chronic lung disease.   01/02/22 He reports he has been struggling to breath despite being on BiPAP. He will awaken at times from not breathing. He reports worsening nasal congestion since using oxygen and NIV. He reports headaches that began five days. Daughter presents for additional history. He seems to be gasping with speech despite compliance with oxygen. Denies coughing or wheezing. He is able to walk briskly but feels fatigued after activity.  04/10/22 Since our last visit, he is compliant with his O2. He is using 3-4L. Has not started pulmonary rehab. He has lost motivation after being denied for lung transplant. At baseline, he is walking around the house. He is compliant with his NIV. He reports difficulty tolerating the machine and will awake him at night.  07/24/22 Since our last visit he has been complaint with 3L O2. Pulmonary rehab orientation has been  completed and scheduled 8/22. He reports worsening shortness of breath and sore throat that began yesterday. Sometimes this will progress but since it is early he is not sure. He is interested in [employer] device and previously qualified at last visit. Has been compliant with his NIV nightly and recent sleep study was negative for OSA, unfortunately TCO2 not available.  12/07/22 Since our last visit his daughter was diagnosed with cancer. Was unable to complete rehab due to home situation. He reports that his breathing is fair. He is planning to walk more. Scheduled with nutritionist at [facility] in January. Compliant with trilogy, new [employer] respironics trilogy device. Compliant with NIV however its uncomfortable. Uses portable oxygen. Has shortness of breath with exertion. Worsening productive cough with phlegm. No wheezing. Similar to his prior exacerbations which was improved on low-dose steroids.  04/19/23 He reports his shortness of breath is unchanged. He is compliant with Wixela and Spiriva. He is not sleeping as well and staying up late at night until early morning. Sleeping late and not eating regularly due to this. He is struggling with his Trilogy. Reports alarms due to low inspiratory pressure. Compliant with his oxygen.   01/07/24 Since our last he has exacerbation in October/November and more recently 12/10/23. Compliant with Wixela and Spiriva. However he continues to have good and bad days related to his shortness of breath. He reports less active and sleeping more. This has kept him from being less social and feeling like he is at home more. He has kept his house clean including carpet and curtains washed. Patient is also asking about risk of propane heater in home.  Social History: Former smoker  Past Medical History:  Diagnosis Date   Cancer (HCC)    lung   CHF (congestive heart failure) (HCC)    Diabetes mellitus without complication (HCC)    Dyspnea    Hyperlipidemia    Hypertension      No Known Allergies   Outpatient Medications Prior to Visit  Medication Sig Dispense Refill   acetaminophen (TYLENOL) 325 MG tablet Take 650 mg by mouth every 6 (six) hours as needed for headache or mild pain.     albuterol (VENTOLIN HFA) 108 (90 Base) MCG/ACT inhaler Inhale 1-2 puffs into the lungs every 6 (six) hours as needed.     carvedilol (COREG) 3.125 MG tablet Take 1 tablet (3.125 mg total) by mouth 2 (two) times daily. 60 tablet 0   Cholecalciferol 10 MCG (400 UNIT) CAPS Take 400 Units by mouth daily.     fluticasone-salmeterol (WIXELA INHUB) 250-50 MCG/ACT AEPB Inhale 1 puff into the lungs in the morning and at bedtime. 180 each 3   furosemide (LASIX) 40 MG tablet Take 1 tablet (40 mg total) by mouth daily for 7 days. 30 tablet 0   lisinopril (ZESTRIL) 5 MG tablet Take 1 tablet (5 mg total) by mouth daily. 90 tablet 0   Multiple Vitamin (MULTIVITAMIN) capsule Take 1 capsule by mouth daily.     Multiple Vitamins-Minerals (THERA-M) TABS Take 1 tablet by mouth daily.     pantoprazole (PROTONIX) 40 MG tablet Take 40 mg by mouth daily.     senna-docusate (SENOKOT-S) 8.6-50 MG tablet Take 1 tablet by mouth at bedtime as needed for mild constipation. 20 tablet 0   simvastatin (ZOCOR) 20 MG tablet Take 10 mg by mouth daily.     Tiotropium Bromide Monohydrate (SPIRIVA RESPIMAT) 2.5 MCG/ACT AERS Inhale 2 puffs into the lungs daily. 12 g 3   ascorbic acid (VITAMIN C) 1000 MG tablet Take 1,000 mg by mouth daily.     aspirin 81 MG EC tablet Take 81 mg by mouth daily.     No facility-administered medications prior to visit.    Review of Systems  Constitutional:  Positive for malaise/fatigue. Negative for chills, diaphoresis, fever and weight loss.  HENT:  Negative for congestion.   Respiratory:  Positive for shortness of breath. Negative for cough, hemoptysis, sputum production and wheezing.   Cardiovascular:  Negative for chest pain, palpitations and leg swelling.     Objective:    Vitals:   01/07/24 1011  BP: (!) 143/88  Pulse: 92  SpO2: 98%  Weight: 123 lb (55.8 kg)  Height: 5\' 9"  (1.753 m)    SpO2: 98 % (3L)  Physical Exam: General: Thin-appearing, no acute distress HENT: [place], AT Eyes: EOMI, no scleral icterus Respiratory: Absent left air entry. Clear to auscultation bilaterally.  No crackles, wheezing or rales Cardiovascular: RRR, -M/R/G, no JVD Extremities:-Edema,-tenderness Neuro: AAO x4, CNII-XII grossly intact Psych: Normal mood, normal affect  Data Reviewed:  Imaging: CXR 08/26/21 S/p left pneumonectomy. Complete opacification of left hemithorax. Pleural based calcifications. No infiltrate effusion or edema. CXR 01/07/24 S/p left pneumonectomy. Complete opacification of left hemithorax. Pleural based calcifications. No infiltrate effusion or edema.  PFT: [facility] 10/31/21  FVC 1.17 (30%) FEV1 0.42 (14%) Ratio 36  TLC 62% DLCO 7.8% Interpretation: Very severe obstructive and restrictive defect with severely reduced gas exchange  Labs: CBC    Component Value Date/Time   WBC 4.3 09/15/2021 0246   RBC 3.39 (L) 09/15/2021 0246   HGB 9.5 (L) 09/15/2021 0246  HGB 12.2 (L) 09/26/2014 0431   HCT 31.0 (L) 09/15/2021 0246   HCT 36.9 (L) 09/26/2014 0431   PLT 218 09/15/2021 0246   PLT 199 09/26/2014 0431   MCV 91.4 09/15/2021 0246   MCV 88 09/26/2014 0431   MCH 28.0 09/15/2021 0246   MCHC 30.6 09/15/2021 0246   RDW 13.0 09/15/2021 0246   RDW 13.1 09/26/2014 0431   LYMPHSABS 0.9 09/05/2021 0926   LYMPHSABS 1.9 09/26/2014 0431   MONOABS 0.4 09/05/2021 0926   MONOABS 0.7 09/26/2014 0431   EOSABS 0.1 09/05/2021 0926   EOSABS 0.2 09/26/2014 0431   BASOSABS 0.0 09/05/2021 0926   BASOSABS 0.1 09/26/2014 0431   Absolute eosinophil  09/26/2014-200 09/05/2021-100  ABG    Component Value Date/Time   PHART 7.340 (L) 01/23/2022 1140   PCO2ART 71.3 (HH) 01/23/2022 1140   PO2ART 233 (H) 01/23/2022 1140   HCO3 37.4 (H) 01/23/2022 1140   TCO2  43 (H) 08/24/2021 1234   TCO2 45 (H) 08/24/2021 1234   O2SAT 99.4 01/23/2022 1140   SD Card Compliance Usage Days 09/15/21-11/13/21 - 53/59 days (89%) >4 hours - 85% Average Usage - 5 hours 21 min Average AHI 1.4    Assessment & Plan:   Discussion: 73 year old male with multiple co-morbidities including squamous cell lung cancer s/p left pneumonectomy with chronic pleural effusion, emphysema, moderate pulmonary HTN WHO group III who presents for follow-up. Not in exacerbation. Compliant with triple therapy, oxygen, NIV. His lung disease is progressing worsened in setting of deconditioning. Consider adding Ohtuvayre.  Discussed clinical course and management of COPD including bronchodilator regimen, preventive care including vaccinations and action plan for exacerbation.  Prior vent settings AVAPS-AE TV 600 ml >>> 430 cc Max Pressure 32 cm H20 EPAP min/Max 4/20 PS Min/Max 4/20 Inspiratory 1.4 sec HR 12 BMP  Severe emphysema  --CONTINUE Spiriva 2.5 TWO puffs ONCE a day --CONTINUE Wixela 250 ONE puff TWICE a day --REFER to outpatient [facility] therapy --Provided information on Ohtuvayre. [facility] under [facility] --CXR ordered for today. Stable pneumonectomy. No acute changes  Chronic hypoxemic and hypercapneic respiratory failure  --SpO2 goal >88% --CONTINUE supplemental O2 with 3L on exertion. Has POC  >Will need ambulatory O2 at next visit by 07/2024 to re-qualify for oxygen --CONTINUE non-invasive ventilation nightly with new tidal volume as noted above --[facility] sleep study negative for OSA. Demonstrated nocturnal hypoxemia --Recommend against indoor propane heater due to risk of oxygen consumption. Prefer indoor electric heater if needed in well ventilated area.  Mild-moderate pulmonary hypertension, WHO group III --Not a candidate for selective pulmonary artery vasodilators  Health Maintenance Immunization History  Administered Date(s) Administered   Fluad  Quad(high Dose 65+) 09/09/2021   Influenza, High Dose Seasonal PF 10/07/2019, 10/16/2022, 11/22/2023   Influenza, Seasonal, Injecte, Preservative Fre 03/15/2010   Influenza-Unspecified 08/23/2016, 10/07/2019, 09/09/2021, 10/16/2022   Moderna Covid-19 Fall Seasonal Vaccine 33yrs & older 10/16/2022, 11/22/2023   Moderna Sars-Covid-2 Vaccination 01/28/2020, 02/25/2020, 10/19/2020   Pneumococcal Conjugate-13 10/07/2019   Pneumococcal Polysaccharide-23 05/24/2016   Rsv, Bivalent, Protein Subunit Rsvpref,pf Verdis Frederickson) 01/22/2023   Tdap 09/27/2021   Zoster Recombinant(Shingrix) 09/27/2021, 01/03/2022   CT Lung Screen - not qualified  Orders Placed This Encounter  Procedures   DG Chest 2 View    Standing Status:   Future    Number of Occurrences:   1    Expiration Date:   01/06/2025    Reason for Exam (SYMPTOM  OR DIAGNOSIS REQUIRED):   shortness of  breath    Preferred imaging location?:   [facility]   Ambulatory referral to Physical Therapy    Referral Priority:   Routine    Referral Type:   Physical Medicine    Referral Reason:   Specialty Services Required    Requested Specialty:   Physical Therapy    Number of Visits Requested:   1   No orders of the defined types were placed in this encounter.  Return for April.  I have spent a total time of 45-minutes on the day of the appointment including chart review, data review, collecting history, coordinating care and discussing medical diagnosis and plan with the patient/family. Past medical history, allergies, medications were reviewed. Pertinent imaging, labs and tests included in this note have been reviewed and interpreted independently by me.  Elza Varricchio Mechele Collin, MD [facility] 01/07/2024 12:33 PM  Office Number [phone]

## 2024-01-07 NOTE — Telephone Encounter (Signed)
Patient is returning phone call. Patient phone number is [phone].

## 2024-01-07 NOTE — Patient Instructions (Addendum)
Severe emphysema - uncontrolled --CONTINUE Spiriva 2.5 TWO puffs ONCE a day --CONTINUE Wixela 250 ONE puff TWICE a day --REFER to outpatient [facility] therapy --Provided information on Ohtuvayre. Will [facility] today under [facility] --ORDERED CXR. Go to the 2nd floor  Chronic hypoxemic and hypercapneic respiratory failure  --SpO2 goal >88% --CONTINUE supplemental O2 with 3L on exertion. Has POC  >Will need ambulatory O2 at next visit by 07/2024 to re-qualify for oxygen --CONTINUE non-invasive ventilation nightly with new tidal volume as noted above --[facility] sleep study negative for OSA. Demonstrated nocturnal hypoxemia --Recommend against indoor propane heater due to risk of oxygen consumption. Prefer indoor electric heater if needed in well ventilated area.

## 2024-01-08 MED ORDER — OHTUVAYRE 3 MG/2.5ML IN SUSP: 3.0000 mg | Freq: Two times a day (BID) | RESPIRATORY_TRACT | Status: DC

## 2024-01-08 NOTE — Addendum Note (Signed)
Addended by: Judd Gaudier on: 01/08/2024 11:50 AM   Modules accepted: Orders

## 2024-01-10 ENCOUNTER — Encounter (HOSPITAL_BASED_OUTPATIENT_CLINIC_OR_DEPARTMENT_OTHER): Payer: Self-pay | Admitting: Pulmonary Disease

## 2024-02-12 ENCOUNTER — Telehealth: Payer: Self-pay | Admitting: Pharmacist

## 2024-02-12 NOTE — Telephone Encounter (Signed)
 Received incorrect Ohtuvayre forms. [facility] paietns have a specific form. Completed correct form and faxed with clinicals to [facility]. They will forward form to [facility].  Patient will ultimately need to fill with [facility] (phone [phone])   Fax#: [phone]   Chesley Mires, PharmD, MPH, BCPS, CPP Clinical Pharmacist (Rheumatology and Pulmonology)

## 2024-02-13 ENCOUNTER — Encounter (HOSPITAL_BASED_OUTPATIENT_CLINIC_OR_DEPARTMENT_OTHER): Payer: Self-pay | Admitting: Pulmonary Disease

## 2024-02-13 DIAGNOSIS — J9611 Chronic respiratory failure with hypoxia: Secondary | ICD-10-CM

## 2024-02-13 NOTE — Telephone Encounter (Signed)
 Update settings  AVAPS-AE TV 430 cc Max Pressure 32 cm H20 EPAP min/Max 4/20 PS Min/Max 4/20 Inspiratory 1.4 sec HR 12 BMP  Send in renewed prescription and wants another sleep test on BiPAP.  >>>PAP titration  Ask Tobi Bastos about ??approval for oxygen concentrator  [phone] Extension [phone] Tobi Bastos  - Pulmonary

## 2024-02-13 NOTE — Telephone Encounter (Signed)
 BIPAP needs renewal sent to [place] [phone] Attn: ANNA Settings and new Sleep test wearing machine Approval for concentrator Pt would like notification when all of this has been done

## 2024-02-13 NOTE — Telephone Encounter (Signed)
 Duplicate

## 2024-02-14 ENCOUNTER — Telehealth: Payer: Self-pay | Admitting: Pulmonary Disease

## 2024-02-14 NOTE — Telephone Encounter (Signed)
**Note De-identified  [place] Obfuscation** Please advise 

## 2024-02-14 NOTE — Addendum Note (Signed)
 Addended by: Luciano Cutter on: 02/14/2024 04:38 PM   Modules accepted: Orders

## 2024-02-14 NOTE — Telephone Encounter (Addendum)
 Respiratory rate updated on vent order. Will fax Tobi Bastos [phone] Extension [phone])    AVAPS-AE  TV 430 cc  Max Pressure  32 cm H20  EPAP min/Max 4/20  PS Min/Max 4/20  Inspiratory 1.4 sec  Respiratory Rate 12 BMP   Closing encounter.

## 2024-02-14 NOTE — Telephone Encounter (Signed)
 Addressed in mychart message thread 02/13/24. Closing this encounter.

## 2024-02-14 NOTE — Telephone Encounter (Signed)
 Referral order and notes have been faxed to the [place] [phone]

## 2024-02-14 NOTE — Telephone Encounter (Signed)
 Kat from [facility] [place] calling back regarding the Vent order. The only thing missing on the order form is Avaps rate. Needs this for the correct settings.   Please advise.  Direct line- [phone] ext [phone]

## 2024-02-14 NOTE — Telephone Encounter (Signed)
 Cat needs Avaps for ventilator. Also needs order for oxygen. Cat phone number is [phone]. Fax number is [phone].

## 2024-03-03 ENCOUNTER — Encounter (HOSPITAL_BASED_OUTPATIENT_CLINIC_OR_DEPARTMENT_OTHER): Payer: Self-pay | Admitting: Pulmonary Disease

## 2024-03-04 NOTE — Telephone Encounter (Signed)
**Note De-identified  [place] Obfuscation** Please advise 

## 2024-03-04 NOTE — Telephone Encounter (Signed)
 Prior [employer],  Could you please help with price investigation for Ohtuvayre. Patient is a [place] patient but also has secondary coverage with [facility] Medicare.  Also are there options for patient assistance with this medication?  Thank you ahead of time.

## 2024-03-07 NOTE — Telephone Encounter (Signed)
 Referral was sent to [facility]. It should be covered.  There is patient assistance but patients need to habe paid $2000 out of pocket for medical and pharmacy for 2025 calendar year (most patients do not qualify at this pt in calendar year is what we are finding)

## 2024-03-10 NOTE — Telephone Encounter (Signed)
 Addressed his questions regarding medication including side effect profile. Provided VA number that handles the medication referral. May need to pursue patient assistance if VA denies.

## 2024-03-10 NOTE — Telephone Encounter (Signed)
 [name] [name] has questions for you

## 2024-03-12 ENCOUNTER — Telehealth (HOSPITAL_BASED_OUTPATIENT_CLINIC_OR_DEPARTMENT_OTHER): Payer: Self-pay | Admitting: Pulmonary Disease

## 2024-03-12 NOTE — Telephone Encounter (Signed)
 Copied from CRM [id number]. Topic: General - Other >> Mar 12, 2024 10:19 AM Para March B wrote: Reason for CRM: requesting to speak with Nurse.

## 2024-03-12 NOTE — Telephone Encounter (Signed)
 Tried to call patient but got VM Left pt a message if he needed to return call he could try to call back or send me a my chart message might be easier. I did leave the phone number in case he needs to call back

## 2024-03-12 NOTE — Telephone Encounter (Signed)
 Spoke with pt he wanted to know the phone number for the company handling the new COPD med

## 2024-03-24 ENCOUNTER — Telehealth (HOSPITAL_BASED_OUTPATIENT_CLINIC_OR_DEPARTMENT_OTHER): Payer: Self-pay

## 2024-03-24 ENCOUNTER — Ambulatory Visit (HOSPITAL_BASED_OUTPATIENT_CLINIC_OR_DEPARTMENT_OTHER): Payer: No Typology Code available for payment source | Admitting: Pulmonary Disease

## 2024-03-24 NOTE — Telephone Encounter (Signed)
 Copied from CRM [id number]. Topic: Clinical - Medical Advice >> Mar 24, 2024  9:29 AM Shelbie Proctor wrote: Reason for CRM: Patient [phone] has some questions on the preliminary results that was discussed with his VA doctors. Patient did not provided any further details. Patient wants to speak with Dr. Everardo All, please call back.

## 2024-03-26 NOTE — Telephone Encounter (Signed)
 I contacted patient via telephone on 03/26/24. Addressed questions and concerns.  Agreed with his VA physician's recommendations for PAP titration, serial labs and initiation of ensifentrine if this is an option that we can obtain via [place] or private insurance.  I have communicated with his VA physician about his agreement for the above plan. Also requested for referral approval for pulmonary rehab.

## 2024-05-01 ENCOUNTER — Ambulatory Visit (HOSPITAL_BASED_OUTPATIENT_CLINIC_OR_DEPARTMENT_OTHER): Payer: Self-pay | Admitting: Pulmonary Disease

## 2024-05-01 NOTE — Telephone Encounter (Signed)
 Pt.notified

## 2024-05-01 NOTE — Telephone Encounter (Addendum)
 Copied from CRM [id number]. Topic: Clinical - Medication Question >> May 01, 2024  1:01 PM Hilton Lucky wrote: Reason for CRM: Patient states has been taking WIEXLA. States his breathing was off this morning. States hard to inhale and exhale. Patient states attempted to take inhaler but could not inhale very well. Patient is wanting to inquire if he should do it again, as he didn't get much to any of it.   Patient seeking medical advisement for current moment. Sending to NT.  TRIAGE SUMMARY NOTE: Pt reporting that he took his wixela when he had not settled his breath yet after exertion, "could just barely feel it just a little bit" of the wixela in his mouth, asking if he should redo the dose or just wait until his next scheduled dose. Pt confirms no change in his baseline, no more SOB than usual or other symptoms. Nurse looking through [employer] for more info on [employer], pt requesting call back. Advised likely best to wait until next scheduled dose so not getting more med than prescribed, but advised that sending HP message to office for call back with further recommendations. Pt verbalized understanding. Please advise.  E2C2 Pulmonary Triage - Initial Assessment Questions "Chief Complaint (e.g., cough, sob, wheezing, fever, chills, sweat or additional symptoms) *Go to specific symptom protocol after initial questions. In mornings more SOB SOB at rest sometimes No wheezing, chest pain, dizziness, weakness Thought I was ready for it, could just barely inhale it Not that having trouble inhaling the wixela, just hadn't caught his breath enough to take it, by time got ready to do it, wasn't ready to do it Could just barely feel it just a little bit No change in his level of SOB  Reason for Disposition  [1] Caller has URGENT medicine question about med that PCP or specialist prescribed AND [2] triager unable to answer question  Answer Assessment - Initial Assessment  Questions 1. NAME of MEDICINE: "What medicine(s) are you calling about?"     wixela 2. QUESTION: "What is your question?" (e.g., double dose of medicine, side effect)     Asking if he should do another try of his wixela now since thinks didn't get good enough inhale for full dose or if should just take his next dose when scheduled 3. PRESCRIBER: "Who prescribed the medicine?" Reason: if prescribed by specialist, call should be referred to that group.     pulm 4. SYMPTOMS: "Do you have any symptoms?" If Yes, ask: "What symptoms are you having?"  "How bad are the symptoms (e.g., mild, moderate, severe)     Confirms no worsening, no change from his baseline  Protocols used: Medication Question Call-A-AH

## 2024-05-01 NOTE — Telephone Encounter (Signed)
 Please advise if pt should wait until next scheduled dose of Wixela or take another dose today?

## 2024-05-23 ENCOUNTER — Telehealth: Payer: Self-pay | Admitting: Pharmacist

## 2024-05-23 NOTE — Telephone Encounter (Addendum)
 Will need to work with patient to complete new Ohtuvayre  paperwork for non-VA patients (do not mark VA coverage on form)  Printed form and will work on completion once back in office  Geraldene Kleine, PharmD, MPH, BCPS, CPP Clinical Pharmacist (Rheumatology and Pulmonology)  ----- Message from Quillian Brunt sent at 04/11/2024  8:01 AM EDT ----- Hi team,  I had previously reached out regarding ensifentrine  and was directed to the [facility] however his [facility] pulmonologist has told me that they will not cover it.  Can you help me look into whether is other insurance [facility]) will cover this?  Appreciate everyone's time. Dr. Washington Hacker

## 2024-05-27 ENCOUNTER — Emergency Department

## 2024-05-27 ENCOUNTER — Inpatient Hospital Stay

## 2024-05-27 ENCOUNTER — Other Ambulatory Visit: Payer: Self-pay

## 2024-05-27 ENCOUNTER — Inpatient Hospital Stay
Admission: EM | Admit: 2024-05-27 | Discharge: 2024-05-31 | DRG: 189 | Disposition: A | Discharge status: Home Health | Attending: Internal Medicine | Admitting: Internal Medicine

## 2024-05-27 DIAGNOSIS — J9602 Acute respiratory failure with hypercapnia: Secondary | ICD-10-CM

## 2024-05-27 DIAGNOSIS — Z87891 Personal history of nicotine dependence: Secondary | ICD-10-CM

## 2024-05-27 DIAGNOSIS — J441 Chronic obstructive pulmonary disease with (acute) exacerbation: Secondary | ICD-10-CM | POA: Diagnosis present

## 2024-05-27 DIAGNOSIS — Z885 Allergy status to narcotic agent status: Secondary | ICD-10-CM | POA: Diagnosis not present

## 2024-05-27 DIAGNOSIS — Z7951 Long term (current) use of inhaled steroids: Secondary | ICD-10-CM | POA: Diagnosis not present

## 2024-05-27 DIAGNOSIS — K2289 Other specified disease of esophagus: Secondary | ICD-10-CM | POA: Diagnosis present

## 2024-05-27 DIAGNOSIS — T50905A Adverse effect of unspecified drugs, medicaments and biological substances, initial encounter: Secondary | ICD-10-CM | POA: Diagnosis present

## 2024-05-27 DIAGNOSIS — D649 Anemia, unspecified: Secondary | ICD-10-CM | POA: Diagnosis present

## 2024-05-27 DIAGNOSIS — J439 Emphysema, unspecified: Secondary | ICD-10-CM | POA: Diagnosis present

## 2024-05-27 DIAGNOSIS — E8729 Other acidosis: Secondary | ICD-10-CM | POA: Diagnosis present

## 2024-05-27 DIAGNOSIS — E785 Hyperlipidemia, unspecified: Secondary | ICD-10-CM | POA: Diagnosis present

## 2024-05-27 DIAGNOSIS — E119 Type 2 diabetes mellitus without complications: Secondary | ICD-10-CM | POA: Diagnosis present

## 2024-05-27 DIAGNOSIS — I10 Essential (primary) hypertension: Secondary | ICD-10-CM | POA: Diagnosis not present

## 2024-05-27 DIAGNOSIS — Z902 Acquired absence of lung [part of]: Secondary | ICD-10-CM

## 2024-05-27 DIAGNOSIS — Z79899 Other long term (current) drug therapy: Secondary | ICD-10-CM

## 2024-05-27 DIAGNOSIS — F4310 Post-traumatic stress disorder, unspecified: Secondary | ICD-10-CM | POA: Diagnosis present

## 2024-05-27 DIAGNOSIS — R5381 Other malaise: Secondary | ICD-10-CM | POA: Diagnosis present

## 2024-05-27 DIAGNOSIS — K219 Gastro-esophageal reflux disease without esophagitis: Secondary | ICD-10-CM | POA: Diagnosis present

## 2024-05-27 DIAGNOSIS — R079 Chest pain, unspecified: Secondary | ICD-10-CM | POA: Diagnosis not present

## 2024-05-27 DIAGNOSIS — R1310 Dysphagia, unspecified: Secondary | ICD-10-CM | POA: Diagnosis present

## 2024-05-27 DIAGNOSIS — J9611 Chronic respiratory failure with hypoxia: Secondary | ICD-10-CM

## 2024-05-27 DIAGNOSIS — J432 Centrilobular emphysema: Secondary | ICD-10-CM

## 2024-05-27 DIAGNOSIS — Z9981 Dependence on supplemental oxygen: Secondary | ICD-10-CM | POA: Diagnosis not present

## 2024-05-27 DIAGNOSIS — J9621 Acute and chronic respiratory failure with hypoxia: Secondary | ICD-10-CM | POA: Diagnosis present

## 2024-05-27 DIAGNOSIS — I110 Hypertensive heart disease with heart failure: Secondary | ICD-10-CM | POA: Diagnosis present

## 2024-05-27 DIAGNOSIS — J9612 Chronic respiratory failure with hypercapnia: Secondary | ICD-10-CM | POA: Diagnosis not present

## 2024-05-27 DIAGNOSIS — R0789 Other chest pain: Secondary | ICD-10-CM | POA: Diagnosis present

## 2024-05-27 DIAGNOSIS — Z85118 Personal history of other malignant neoplasm of bronchus and lung: Secondary | ICD-10-CM | POA: Diagnosis not present

## 2024-05-27 DIAGNOSIS — I272 Pulmonary hypertension, unspecified: Secondary | ICD-10-CM | POA: Diagnosis present

## 2024-05-27 DIAGNOSIS — J9622 Acute and chronic respiratory failure with hypercapnia: Secondary | ICD-10-CM | POA: Diagnosis present

## 2024-05-27 DIAGNOSIS — I5032 Chronic diastolic (congestive) heart failure: Secondary | ICD-10-CM | POA: Diagnosis present

## 2024-05-27 DIAGNOSIS — R748 Abnormal levels of other serum enzymes: Secondary | ICD-10-CM | POA: Diagnosis not present

## 2024-05-27 DIAGNOSIS — J9692 Respiratory failure, unspecified with hypercapnia: Secondary | ICD-10-CM | POA: Diagnosis present

## 2024-05-27 LAB — BLOOD GAS, ARTERIAL
Acid-Base Excess: 18.9 mmol/L — ABNORMAL HIGH (ref 0.0–2.0)
Acid-Base Excess: 21.9 mmol/L — ABNORMAL HIGH (ref 0.0–2.0)
Acid-Base Excess: 26.6 mmol/L — ABNORMAL HIGH (ref 0.0–2.0)
Acid-Base Excess: 25.3 mmol/L — ABNORMAL HIGH (ref 0.0–2.0)
Bicarbonate: 52.2 mmol/L — ABNORMAL HIGH (ref 20.0–28.0)
Bicarbonate: 53.3 mmol/L — ABNORMAL HIGH (ref 20.0–28.0)
Bicarbonate: 57.6 mmol/L — ABNORMAL HIGH (ref 20.0–28.0)
Bicarbonate: 56.3 mmol/L — ABNORMAL HIGH (ref 20.0–28.0)
Delivery systems: POSITIVE
Delivery systems: POSITIVE
Delivery systems: POSITIVE
Expiratory PAP: 10 cmH2O
FIO2: 35 %
FIO2: 35 %
FIO2: 35 %
MECHVT: 450 mL
MECHVT: 450 mL
Mechanical Rate: 20
O2 Content: 2 L/min
O2 Saturation: 97.9 %
O2 Saturation: 93.3 %
O2 Saturation: 99.6 %
O2 Saturation: 97.3 %
PEEP: 10 cmH2O
PEEP: 10 cmH2O
Patient temperature: 37
Patient temperature: 37
Patient temperature: 37
Patient temperature: 37
RATE: 20 {breaths}/min
pCO2 arterial: >123 mmHg (ref 32–48)
pCO2 arterial: 116 mmHg (ref 32–48)
pCO2 arterial: 93 mmHg (ref 32–48)
pCO2 arterial: 93 mmHg (ref 32–48)
pH, Arterial: 7.17 — CL (ref 7.35–7.45)
pH, Arterial: 7.27 — ABNORMAL LOW (ref 7.35–7.45)
pH, Arterial: 7.4 (ref 7.35–7.45)
pH, Arterial: 7.39 (ref 7.35–7.45)
pO2, Arterial: 88 mmHg (ref 83–108)
pO2, Arterial: 62 mmHg — ABNORMAL LOW (ref 83–108)
pO2, Arterial: 102 mmHg (ref 83–108)
pO2, Arterial: 74 mmHg — ABNORMAL LOW (ref 83–108)

## 2024-05-27 LAB — CBC
HCT: 33.6 % — ABNORMAL LOW (ref 39.0–52.0)
HCT: 31.7 % — ABNORMAL LOW (ref 39.0–52.0)
Hemoglobin: 10 g/dL — ABNORMAL LOW (ref 13.0–17.0)
Hemoglobin: 9.4 g/dL — ABNORMAL LOW (ref 13.0–17.0)
MCH: 28.7 pg (ref 26.0–34.0)
MCH: 28.9 pg (ref 26.0–34.0)
MCHC: 29.8 g/dL — ABNORMAL LOW (ref 30.0–36.0)
MCHC: 29.7 g/dL — ABNORMAL LOW (ref 30.0–36.0)
MCV: 96.6 fL (ref 80.0–100.0)
MCV: 97.5 fL (ref 80.0–100.0)
Platelets: 142 10*3/uL — ABNORMAL LOW (ref 150–400)
Platelets: 132 10*3/uL — ABNORMAL LOW (ref 150–400)
RBC: 3.48 MIL/uL — ABNORMAL LOW (ref 4.22–5.81)
RBC: 3.25 MIL/uL — ABNORMAL LOW (ref 4.22–5.81)
RDW: 11.9 % (ref 11.5–15.5)
RDW: 11.9 % (ref 11.5–15.5)
WBC: 5.4 10*3/uL (ref 4.0–10.5)
WBC: 5.4 10*3/uL (ref 4.0–10.5)
nRBC: 0 % (ref 0.0–0.2)
nRBC: 0 % (ref 0.0–0.2)

## 2024-05-27 LAB — CBC WITH DIFFERENTIAL/PLATELET
Abs Immature Granulocytes: 0.01 10*3/uL (ref 0.00–0.07)
Basophils Absolute: 0 10*3/uL (ref 0.0–0.1)
Basophils Relative: 0 %
Eosinophils Absolute: 0.1 10*3/uL (ref 0.0–0.5)
Eosinophils Relative: 1 %
HCT: 33.6 % — ABNORMAL LOW (ref 39.0–52.0)
Hemoglobin: 10 g/dL — ABNORMAL LOW (ref 13.0–17.0)
Immature Granulocytes: 0 %
Lymphocytes Relative: 14 %
Lymphs Abs: 0.7 10*3/uL (ref 0.7–4.0)
MCH: 28.7 pg (ref 26.0–34.0)
MCHC: 29.8 g/dL — ABNORMAL LOW (ref 30.0–36.0)
MCV: 96.3 fL (ref 80.0–100.0)
Monocytes Absolute: 0.4 10*3/uL (ref 0.1–1.0)
Monocytes Relative: 9 %
Neutro Abs: 3.8 10*3/uL (ref 1.7–7.7)
Neutrophils Relative %: 76 %
Platelets: 137 10*3/uL — ABNORMAL LOW (ref 150–400)
RBC: 3.49 MIL/uL — ABNORMAL LOW (ref 4.22–5.81)
RDW: 11.9 % (ref 11.5–15.5)
WBC: 5.1 10*3/uL (ref 4.0–10.5)
nRBC: 0 % (ref 0.0–0.2)

## 2024-05-27 LAB — BRAIN NATRIURETIC PEPTIDE
B Natriuretic Peptide: 53.9 pg/mL (ref 0.0–100.0)
B Natriuretic Peptide: 126.2 pg/mL — ABNORMAL HIGH (ref 0.0–100.0)

## 2024-05-27 LAB — COMPREHENSIVE METABOLIC PANEL WITH GFR
ALT: 19 U/L (ref 0–44)
ALT: 22 U/L (ref 0–44)
AST: 23 U/L (ref 15–41)
AST: 25 U/L (ref 15–41)
Albumin: 4 g/dL (ref 3.5–5.0)
Albumin: 3.6 g/dL (ref 3.5–5.0)
Alkaline Phosphatase: 54 U/L (ref 38–126)
Alkaline Phosphatase: 56 U/L (ref 38–126)
Anion gap: 9 (ref 5–15)
Anion gap: 10 (ref 5–15)
BUN: 20 mg/dL (ref 8–23)
BUN: 21 mg/dL (ref 8–23)
CO2: 43 mmol/L — ABNORMAL HIGH (ref 22–32)
CO2: 43 mmol/L — ABNORMAL HIGH (ref 22–32)
Calcium: 9.4 mg/dL (ref 8.9–10.3)
Calcium: 9.2 mg/dL (ref 8.9–10.3)
Chloride: 85 mmol/L — ABNORMAL LOW (ref 98–111)
Chloride: 87 mmol/L — ABNORMAL LOW (ref 98–111)
Creatinine, Ser: 0.8 mg/dL (ref 0.61–1.24)
Creatinine, Ser: 0.73 mg/dL (ref 0.61–1.24)
GFR, Estimated: >60 mL/min (ref >60)
GFR, Estimated: >60 mL/min (ref >60)
Glucose, Bld: 142 mg/dL — ABNORMAL HIGH (ref 70–99)
Glucose, Bld: 76 mg/dL (ref 70–99)
Potassium: 3.9 mmol/L (ref 3.5–5.1)
Potassium: 4.7 mmol/L (ref 3.5–5.1)
Sodium: 137 mmol/L (ref 135–145)
Sodium: 140 mmol/L (ref 135–145)
Total Bilirubin: 0.5 mg/dL (ref 0.0–1.2)
Total Bilirubin: 0.6 mg/dL (ref 0.0–1.2)
Total Protein: 7.2 g/dL (ref 6.5–8.1)
Total Protein: 6.6 g/dL (ref 6.5–8.1)

## 2024-05-27 LAB — TROPONIN I (HIGH SENSITIVITY)
Troponin I (High Sensitivity): 9 ng/L (ref <18)
Troponin I (High Sensitivity): 8 ng/L (ref <18)
Troponin I (High Sensitivity): 33 ng/L — ABNORMAL HIGH (ref <18)
Troponin I (High Sensitivity): 39 ng/L — ABNORMAL HIGH (ref <18)

## 2024-05-27 LAB — CREATININE, SERUM
Creatinine, Ser: 0.83 mg/dL (ref 0.61–1.24)
GFR, Estimated: >60 mL/min (ref >60)

## 2024-05-27 LAB — MAGNESIUM
Magnesium: 2 mg/dL (ref 1.7–2.4)
Magnesium: 2.2 mg/dL (ref 1.7–2.4)

## 2024-05-27 LAB — BLOOD GAS, VENOUS
Acid-Base Excess: 21.7 mmol/L — ABNORMAL HIGH (ref 0.0–2.0)
Bicarbonate: 56.5 mmol/L — ABNORMAL HIGH (ref 20.0–28.0)
Patient temperature: 37
pCO2, Ven: >123 mmHg (ref 44–60)
pH, Ven: 7.22 — ABNORMAL LOW (ref 7.25–7.43)

## 2024-05-27 LAB — D-DIMER, QUANTITATIVE: D-Dimer, Quant: 1.51 ug{FEU}/mL — ABNORMAL HIGH (ref 0.00–0.50)

## 2024-05-27 LAB — CBG MONITORING, ED: Glucose-Capillary: 128 mg/dL — ABNORMAL HIGH (ref 70–99)

## 2024-05-27 LAB — HIV ANTIBODY (ROUTINE TESTING W REFLEX): HIV Screen 4th Generation wRfx: NONREACTIVE

## 2024-05-27 LAB — LIPASE, BLOOD: Lipase: 37 U/L (ref 11–51)

## 2024-05-27 LAB — GLUCOSE, CAPILLARY
Glucose-Capillary: 72 mg/dL (ref 70–99)
Glucose-Capillary: 74 mg/dL (ref 70–99)
Glucose-Capillary: 77 mg/dL (ref 70–99)
Glucose-Capillary: 97 mg/dL (ref 70–99)

## 2024-05-27 MED ORDER — LISINOPRIL 5 MG PO TABS
5.0000 mg | ORAL_TABLET | Freq: Every day | ORAL | Status: DC
  Administered 2024-05-28 – 2024-05-29 (×2): 5 mg via ORAL
  Filled 2024-05-27 (×3): qty 1

## 2024-05-27 MED ORDER — CHLORHEXIDINE GLUCONATE CLOTH 2 % EX PADS
6.0000 | MEDICATED_PAD | Freq: Every day | CUTANEOUS | Status: DC
  Administered 2024-05-27 – 2024-05-31 (×5): 6 via TOPICAL

## 2024-05-27 MED ORDER — ACETAMINOPHEN 650 MG RE SUPP
650.0000 mg | Freq: Four times a day (QID) | RECTAL | Status: DC | PRN
Start: 2024-05-27 — End: 2024-05-31

## 2024-05-27 MED ORDER — ACETAMINOPHEN 325 MG PO TABS
650.0000 mg | ORAL_TABLET | Freq: Four times a day (QID) | ORAL | Status: DC | PRN
  Administered 2024-05-30: 650 mg via ORAL
  Filled 2024-05-27: qty 2

## 2024-05-27 MED ORDER — NALOXONE HCL 2 MG/2ML IJ SOSY
0.4000 mg | PREFILLED_SYRINGE | Freq: Once | INTRAMUSCULAR | Status: AC
  Administered 2024-05-27: 0.4 mg via INTRAVENOUS
  Filled 2024-05-27: qty 2

## 2024-05-27 MED ORDER — NALOXONE HCL 2 MG/2ML IJ SOSY
1.0000 mg | PREFILLED_SYRINGE | Freq: Once | INTRAMUSCULAR | Status: AC
  Administered 2024-05-27: 1 mg via INTRAVENOUS
  Filled 2024-05-27: qty 2

## 2024-05-27 MED ORDER — ALUM & MAG HYDROXIDE-SIMETH 200-200-20 MG/5ML PO SUSP
30.0000 mL | Freq: Four times a day (QID) | ORAL | Status: DC | PRN
  Administered 2024-05-28: 30 mL via ORAL
  Filled 2024-05-27 (×2): qty 30

## 2024-05-27 MED ORDER — PANTOPRAZOLE SODIUM 40 MG IV SOLR
80.0000 mg | Freq: Once | INTRAVENOUS | Status: AC
  Administered 2024-05-27: 80 mg via INTRAVENOUS
  Filled 2024-05-27: qty 20

## 2024-05-27 MED ORDER — SODIUM CHLORIDE 0.9 % IV SOLN: INTRAVENOUS | Status: DC

## 2024-05-27 MED ORDER — NALOXONE HCL 4 MG/10ML IJ SOLN
0.2500 mg/h | INTRAVENOUS | Status: DC
  Administered 2024-05-27 (×2): 0.25 mg/h via INTRAVENOUS
  Filled 2024-05-27: qty 10

## 2024-05-27 MED ORDER — IPRATROPIUM-ALBUTEROL 0.5-2.5 (3) MG/3ML IN SOLN: 3.0000 mL | Freq: Four times a day (QID) | RESPIRATORY_TRACT | Status: DC | PRN

## 2024-05-27 MED ORDER — ONDANSETRON HCL 4 MG/2ML IJ SOLN
4.0000 mg | Freq: Once | INTRAMUSCULAR | Status: AC
  Administered 2024-05-27: 4 mg via INTRAVENOUS
  Filled 2024-05-27: qty 2

## 2024-05-27 MED ORDER — ALUM & MAG HYDROXIDE-SIMETH 200-200-20 MG/5ML PO SUSP
30.0000 mL | Freq: Once | ORAL | Status: AC
  Administered 2024-05-27: 30 mL via ORAL
  Filled 2024-05-27: qty 30

## 2024-05-27 MED ORDER — CARVEDILOL 3.125 MG PO TABS
3.1250 mg | ORAL_TABLET | Freq: Two times a day (BID) | ORAL | Status: DC
  Administered 2024-05-28 – 2024-05-30 (×6): 3.125 mg via ORAL
  Filled 2024-05-27 (×7): qty 1

## 2024-05-27 MED ORDER — ONDANSETRON HCL 4 MG PO TABS
4.0000 mg | ORAL_TABLET | Freq: Four times a day (QID) | ORAL | Status: DC | PRN
Start: 2024-05-27 — End: 2024-05-31

## 2024-05-27 MED ORDER — ONDANSETRON HCL 4 MG/2ML IJ SOLN: 4.0000 mg | Freq: Four times a day (QID) | INTRAMUSCULAR | Status: DC | PRN

## 2024-05-27 MED ORDER — ARFORMOTEROL TARTRATE 15 MCG/2ML IN NEBU
15.0000 ug | INHALATION_SOLUTION | Freq: Two times a day (BID) | RESPIRATORY_TRACT | Status: DC
  Administered 2024-05-27 – 2024-05-31 (×8): 15 ug via RESPIRATORY_TRACT
  Filled 2024-05-27 (×9): qty 2

## 2024-05-27 MED ORDER — MORPHINE SULFATE (PF) 4 MG/ML IV SOLN
4.0000 mg | Freq: Once | INTRAVENOUS | Status: AC
  Administered 2024-05-27: 4 mg via INTRAVENOUS
  Filled 2024-05-27: qty 1

## 2024-05-27 MED ORDER — DEXTROSE-SODIUM CHLORIDE 5-0.45 % IV SOLN: INTRAVENOUS | Status: DC

## 2024-05-27 MED ORDER — BUDESONIDE 0.5 MG/2ML IN SUSP
0.5000 mg | Freq: Every day | RESPIRATORY_TRACT | Status: DC
  Administered 2024-05-27 – 2024-05-31 (×4): 0.5 mg via RESPIRATORY_TRACT
  Filled 2024-05-27 (×5): qty 2

## 2024-05-27 MED ORDER — FUROSEMIDE 40 MG PO TABS
40.0000 mg | ORAL_TABLET | Freq: Every day | ORAL | Status: DC
  Administered 2024-05-27 – 2024-05-30 (×4): 40 mg via ORAL
  Filled 2024-05-27: qty 1
  Filled 2024-05-27 (×2): qty 2
  Filled 2024-05-27: qty 1

## 2024-05-27 MED ORDER — ACETAMINOPHEN 10 MG/ML IV SOLN
1000.0000 mg | Freq: Three times a day (TID) | INTRAVENOUS | Status: AC | PRN
  Administered 2024-05-27: 1000 mg via INTRAVENOUS
  Filled 2024-05-27: qty 100

## 2024-05-27 MED ORDER — PANTOPRAZOLE SODIUM 40 MG IV SOLR
40.0000 mg | Freq: Two times a day (BID) | INTRAVENOUS | Status: DC
  Administered 2024-05-27 – 2024-05-28 (×2): 40 mg via INTRAVENOUS
  Filled 2024-05-27 (×2): qty 10

## 2024-05-27 MED ORDER — HEPARIN SODIUM (PORCINE) 5000 UNIT/ML IJ SOLN: 5000.0000 [IU] | Freq: Three times a day (TID) | INTRAMUSCULAR | Status: DC

## 2024-05-27 MED ORDER — IOHEXOL 350 MG/ML SOLN
75.0000 mL | Freq: Once | INTRAVENOUS | Status: AC | PRN
  Administered 2024-05-27: 75 mL via INTRAVENOUS

## 2024-05-27 MED ORDER — SUCRALFATE 1 GM/10ML PO SUSP
1.0000 g | Freq: Three times a day (TID) | ORAL | Status: DC
  Administered 2024-05-28 – 2024-05-31 (×12): 1 g via ORAL
  Filled 2024-05-27 (×15): qty 10

## 2024-05-27 MED ORDER — HEPARIN SODIUM (PORCINE) 5000 UNIT/ML IJ SOLN
5000.0000 [IU] | Freq: Three times a day (TID) | INTRAMUSCULAR | Status: DC
  Administered 2024-05-27 – 2024-05-30 (×8): 5000 [IU] via SUBCUTANEOUS
  Filled 2024-05-27 (×8): qty 1

## 2024-05-27 MED ORDER — PANTOPRAZOLE SODIUM 40 MG PO TBEC: 40.0000 mg | DELAYED_RELEASE_TABLET | Freq: Two times a day (BID) | ORAL | Status: DC

## 2024-05-27 MED ORDER — SIMVASTATIN 20 MG PO TABS
10.0000 mg | ORAL_TABLET | Freq: Every day | ORAL | Status: DC
  Administered 2024-05-27 – 2024-05-31 (×5): 10 mg via ORAL
  Filled 2024-05-27 (×5): qty 1

## 2024-05-27 NOTE — ED Provider Notes (Signed)
 [facility] Provider Note    Event Date/Time   First MD Initiated Contact with Patient 05/27/24 [phone]     (approximate)   History   Chest Pain   HPI  [name] [name] is a 73 y.o. male with history of lung cancer status post partial pneumonectomy in 2005, GERD, hypertension, hyperlipidemia, diabetes, CHF, emphysema, pulmonary hypertension on 3 L of oxygen chronically who presents to the emergency department complaints of central chest pain that feels like a burning.  He states he has been told this is acid reflux.  He states he has some shortness of breath because of the pain.  No nausea, vomiting, diarrhea, bloody stools or melena.  No abdominal discomfort.  Denies history of PE or DVT.  No calf tenderness or calf swelling.   History provided by patient, EMS.    Past Medical History:  Diagnosis Date   Cancer (HCC)    lung   CHF (congestive heart failure) (HCC)    Diabetes mellitus without complication (HCC)    Dyspnea    Hyperlipidemia    Hypertension     Past Surgical History:  Procedure Laterality Date   BALLOON DILATION N/A 08/26/2021   Procedure: BALLOON DILATION;  Surgeon: Lajuan Pila, MD;  Location: [facility] ENDOSCOPY;  Service: Endoscopy;  Laterality: N/A;   COLONOSCOPY WITH PROPOFOL  N/A 04/20/2021   Procedure: COLONOSCOPY WITH PROPOFOL ;  Surgeon: Toledo, Alphonsus Jeans, MD;  Location: [facility] ENDOSCOPY;  Service: Gastroenterology;  Laterality: N/A;   ESOPHAGOGASTRODUODENOSCOPY N/A 04/20/2021   Procedure: ESOPHAGOGASTRODUODENOSCOPY (EGD);  Surgeon: Toledo, Alphonsus Jeans, MD;  Location: [facility] ENDOSCOPY;  Service: Gastroenterology;  Laterality: N/A;   ESOPHAGOGASTRODUODENOSCOPY (EGD) WITH PROPOFOL  N/A 08/26/2021   Procedure: ESOPHAGOGASTRODUODENOSCOPY (EGD) WITH PROPOFOL ;  Surgeon: Lajuan Pila, MD;  Location: [facility] ENDOSCOPY;  Service: Endoscopy;  Laterality: N/A;   LUNG REMOVAL, PARTIAL     RIGHT HEART CATH N/A 08/24/2021   Procedure: RIGHT HEART CATH;  Surgeon:  Mardell Shade, MD;  Location: [facility] INVASIVE CV LAB;  Service: Cardiovascular;  Laterality: N/A;    MEDICATIONS:  Prior to Admission medications   Medication Sig Start Date End Date Taking? Authorizing Provider  acetaminophen  (TYLENOL ) 325 MG tablet Take 650 mg by mouth every 6 (six) hours as needed for headache or mild pain.    [provider]  albuterol  (VENTOLIN  HFA) 108 (90 Base) MCG/ACT inhaler Inhale 1-2 puffs into the lungs every 6 (six) hours as needed. 04/04/21   [provider]  carvedilol  (COREG ) 3.125 MG tablet Take 1 tablet (3.125 mg total) by mouth 2 (two) times daily. 09/16/21   Deforest Fast, MD  Cholecalciferol 10 MCG (400 UNIT) CAPS Take 400 Units by mouth daily.    [provider]  Ensifentrine  (OHTUVAYRE ) 3 MG/2.5ML SUSP Inhale 3 mg into the lungs 2 (two) times daily. 01/08/24   Quillian Brunt, MD  fluticasone -salmeterol [facility] INHUB) 250-50 MCG/ACT AEPB Inhale 1 puff into the lungs in the morning and at bedtime. 01/02/22   Quillian Brunt, MD  furosemide  (LASIX ) 40 MG tablet Take 1 tablet (40 mg total) by mouth daily for 7 days. 01/02/22   Quillian Brunt, MD  lisinopril  (ZESTRIL ) 5 MG tablet Take 1 tablet (5 mg total) by mouth daily. 01/02/22   Quillian Brunt, MD  Multiple Vitamin (MULTIVITAMIN) capsule Take 1 capsule by mouth daily.    [provider]  Multiple Vitamins-Minerals (THERA-M) TABS Take 1 tablet by mouth daily.    [provider]  pantoprazole  (PROTONIX )  40 MG tablet Take 40 mg by mouth daily.    [provider]  senna-docusate (SENOKOT-S) 8.6-50 MG tablet Take 1 tablet by mouth at bedtime as needed for mild constipation. 09/15/21   Deforest Fast, MD  simvastatin  (ZOCOR ) 20 MG tablet Take 10 mg by mouth daily.    [provider]  Tiotropium Bromide Monohydrate  (SPIRIVA  RESPIMAT) 2.5 MCG/ACT AERS Inhale 2 puffs into the lungs daily. 01/02/22   Quillian Brunt, MD    Physical Exam    Triage Vital Signs: ED Triage Vitals  Encounter Vitals Group     BP 05/27/24 0316 (!) 145/98     Systolic BP Percentile --      Diastolic BP Percentile --      Pulse Rate 05/27/24 0316 (!) 103     Resp 05/27/24 0316 18     Temp 05/27/24 0316 98.4 F (36.9 C)     Temp Source 05/27/24 0316 Oral     SpO2 05/27/24 0316 100 %     Weight --      Height --      Head Circumference --      Peak Flow --      Pain Score 05/27/24 0317 9     Pain Loc --      Pain Education --      Exclude from Growth Chart --     Most recent vital signs: Vitals:   05/27/24 0630 05/27/24 0749  BP: 119/74   Pulse: (!) 103   Resp: 18   Temp:  98 F (36.7 C)  SpO2: 99%     CONSTITUTIONAL: Alert, responds appropriately to questions. Well-appearing; well-nourished, thin, appears uncomfortable HEAD: Normocephalic, atraumatic EYES: Conjunctivae clear, pupils appear equal, sclera nonicteric ENT: normal nose; moist mucous membranes NECK: Supple, normal ROM CARD: RRR; S1 and S2 appreciated RESP: Normal chest excursion without splinting or tachypnea; breath sounds clear and equal bilaterally; no wheezes, no rhonchi, no rales, no hypoxia or respiratory distress, speaking full sentences ABD/GI: Non-distended; soft, non-tender, no rebound, no guarding, no peritoneal signs BACK: The back appears normal EXT: Normal ROM in all joints; no deformity noted, no edema, no calf tenderness or calf swelling SKIN: Normal color for age and race; warm; no rash on exposed skin NEURO: Moves all extremities equally, normal speech PSYCH: The patient's mood and manner are appropriate.   ED Results / Procedures / Treatments   LABS: (all labs ordered are listed, but only abnormal results are displayed) Labs Reviewed  CBC WITH DIFFERENTIAL/PLATELET - Abnormal; Notable for the following components:      Result Value   RBC 3.49 (*)    Hemoglobin 10.0 (*)    HCT 33.6 (*)    MCHC 29.8 (*)    Platelets 137 (*)    All other  components within normal limits  COMPREHENSIVE METABOLIC PANEL WITH GFR - Abnormal; Notable for the following components:   Chloride 85 (*)    CO2 43 (*)    Glucose, Bld 142 (*)    All other components within normal limits  D-DIMER, QUANTITATIVE - Abnormal; Notable for the following components:   D-Dimer, Quant 1.51 (*)    All other components within normal limits  CBG MONITORING, ED - Abnormal; Notable for the following components:   Glucose-Capillary 128 (*)    All other components within normal limits  LIPASE, BLOOD  BRAIN NATRIURETIC PEPTIDE  MAGNESIUM   BLOOD GAS, ARTERIAL  TROPONIN I (HIGH SENSITIVITY)  TROPONIN I (HIGH SENSITIVITY)  EKG:  EKG Interpretation Date/Time:  Tuesday May 27 2024 03:23:52 EDT Ventricular Rate:  97 PR Interval:  154 QRS Duration:  79 QT Interval:  321 QTC Calculation: 408 R Axis:   63  Text Interpretation: Sinus tachycardia Multiple ventricular premature complexes Anteroseptal infarct, old Baseline wander in lead(s) I III aVL Confirmed by Verneda Golder [phone]) on 05/27/2024 3:50:44 AM         RADIOLOGY: My personal review and interpretation of imaging: CTA of the chest shows no PE, pneumonia.  CT head shows no acute abnormality.  I have personally reviewed all radiology reports.   CT HEAD WO CONTRAST ( ) Result Date: 05/27/2024 CLINICAL DATA:  Altered mental status. EXAM: CT HEAD WITHOUT CONTRAST TECHNIQUE: Contiguous axial images were obtained from the base of the skull through the vertex without intravenous contrast. RADIATION DOSE REDUCTION: This exam was performed according to the departmental dose-optimization program which includes automated exposure control, adjustment of the mA and/or kV according to patient size and/or use of iterative reconstruction technique. COMPARISON:  None Available. FINDINGS: Brain: No evidence of intracranial hemorrhage, acute infarction, hydrocephalus, extra-axial collection, or mass lesion/mass  effect. Vascular:  No hyperdense vessel or other acute findings. Skull: No evidence of fracture or other significant bone abnormality. Sinuses/Orbits:  No acute findings. Other: None. IMPRESSION: Negative noncontrast head CT. Electronically Signed   By: Marlyce Sine M.D.   On: 05/27/2024 07:47   CT Angio Chest PE W and/or Wo Contrast Result Date: 05/27/2024 CLINICAL DATA:  73 year old male with chest pain and shortness of breath. History of a left pneumonectomy with residual large calcified left pleural rind/collection. EXAM: CT ANGIOGRAPHY CHEST WITH CONTRAST TECHNIQUE: Multidetector CT imaging of the chest was performed using the standard protocol during bolus administration of intravenous contrast. Multiplanar CT image reconstructions and MIPs were obtained to evaluate the vascular anatomy. RADIATION DOSE REDUCTION: This exam was performed according to the departmental dose-optimization program which includes automated exposure control, adjustment of the mA and/or kV according to patient size and/or use of iterative reconstruction technique. CONTRAST:  75mL OMNIPAQUE  IOHEXOL  350 MG/ML SOLN COMPARISON:  CTA chest 09/25/2014. Portable chest radiographs 0325 hours today. FINDINGS: Cardiovascular: Excellent contrast bolus timing in the pulmonary arterial tree. 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 No pulmonary artery filling defect identified. Chronic intrathoracic mass effect from left lung findings (below) stable since 2015. Heart size remains within normal limits. No pericardial effusion. Stable visible aorta, negative aside from atherosclerosis and surrounding postoperative changes. Mediastinum/Nodes: Chronic mediastinal shift and postoperative changes with no significant change since 2015. Lungs/Pleura: Chronic large and rim calcified oval fluid collection occupying the left hemithorax with chronic mass effect on the mediastinum appears unchanged  from a 2022 CT with contrast and has low to intermediate density fluid content as before (coronal image 59). Left pneumonectomy changes superimposed, blind-ending left mainstem bronchus unchanged from prior exams. Trachea and right lung major airways remain patent. Centrilobular right lung emphysema. No right pleural effusion, mild dependent atelectasis now in the right costophrenic angle. But no right pleural effusion and otherwise no acute or suspicious right lung opacity. Upper Abdomen: Partially visible small but circumscribed low-density area in the posterior right hepatic lobe on series 5, image 200 is most compatible with benign cyst or hemangioma (no follow-up imaging recommended). Otherwise negative visible essentially noncontrast liver, gallbladder, spleen, pancreas, adrenal glands and kidneys. Upper abdominal stomach and bowel contents mixed with some oral contrast type material. No pneumoperitoneum or  upper abdominal free fluid identified. Musculoskeletal: Stable visualized osseous structures. Review of the MIP images confirms the above findings. IMPRESSION: 1. Negative for pulmonary embolus. 2. Stable chronic lung disease and postoperative appearance of the chest: - left pneumonectomy with large chronic rim calcified fluid collection in the left hemithorax with chronic mass effect on the mediastinum - right lung Emphysema (ICD10-J43.9). 3.  Aortic Atherosclerosis (ICD10-I70.0). Electronically Signed   By: Marlise Simpers M.D.   On: 05/27/2024 05:26   DG Chest Portable 1 View Result Date: 05/27/2024 CLINICAL DATA:  Chest pain and shortness of breath EXAM: PORTABLE CHEST 1 VIEW COMPARISON:  Radiograph 01/07/2024 FINDINGS: Left pneumonectomy with complete opacification of the left hemithorax. The right lung is clear. Stable cardiomediastinal silhouette. No displaced rib fractures. IMPRESSION: No acute abnormality.  Left pneumonectomy. Electronically Signed   By: Rozell Cornet M.D.   On: 05/27/2024 03:34      PROCEDURES:  Critical Care performed: Yes, see critical care procedure note(s)   CRITICAL CARE Performed by: Starling Eck Terriona Horlacher   Total critical care time: 30 minutes  Critical care time was exclusive of separately billable procedures and treating other patients.  Critical care was necessary to treat or prevent imminent or life-threatening deterioration.  Critical care was time spent personally by me on the following activities: development of treatment plan with patient and/or surrogate as well as nursing, discussions with consultants, evaluation of patient's response to treatment, examination of patient, obtaining history from patient or surrogate, ordering and performing treatments and interventions, ordering and review of laboratory studies, ordering and review of radiographic studies, pulse oximetry and re-evaluation of patient's condition.   Aaron Aas1-3 Lead EKG Interpretation  Performed by: Slyvester Latona, Clover Dao, DO Authorized by: Priscilla Kirstein, Clover Dao, DO     Interpretation: abnormal     ECG rate:  103   ECG rate assessment: tachycardic     Rhythm: sinus tachycardia     Ectopy: none     Conduction: normal       IMPRESSION / MDM / ASSESSMENT AND PLAN / ED COURSE  I reviewed the triage vital signs and the nursing notes.    Patient here with complaints of atypical chest pain.  The patient is on the cardiac monitor to evaluate for evidence of arrhythmia and/or significant heart rate changes.   DIFFERENTIAL DIAGNOSIS (includes but not limited to):   GERD, esophagitis, esophageal spasm, esophageal rupture, ACS, PE, dissection, less likely pneumonia, pneumothorax, CHF   Patient's presentation is most consistent with acute presentation with potential threat to life or bodily function.   PLAN: EKG nonischemic.  Will obtain a cardiac and abdominal labs.  Will give Protonix , Mylanta as well as morphine as he appears quite uncomfortable.  Abdominal exam benign.   MEDICATIONS GIVEN IN  ED: Medications  morphine (PF) 4 MG/ML injection 4 mg (4 mg Intravenous Given 05/27/24 0348)  ondansetron  (ZOFRAN ) injection 4 mg (4 mg Intravenous Given 05/27/24 0348)  pantoprazole  (PROTONIX ) injection 80 mg (80 mg Intravenous Given 05/27/24 0346)  alum & mag hydroxide-simeth (MAALOX/MYLANTA) 200-200-20 MG/5ML suspension 30 mL (30 mLs Oral Given 05/27/24 0348)  iohexol  (OMNIPAQUE ) 350 MG/ML injection 75 mL (75 mLs Intravenous Contrast Given 05/27/24 0433)  naloxone [facility]) injection 0.4 mg (0.4 mg Intravenous Given 05/27/24 0531)  naloxone (NARCAN) injection 1 mg (1 mg Intravenous Given 05/27/24 0716)     ED COURSE: Patient's cardiac labs are reassuring with troponin negative x 2.  Chronic anemia which is stable.  Patient also has elevated bicarb which is  chronic for him as well.  Normal LFTs, lipase.  D-dimer elevated so CTA of the chest was obtained and reviewed/interpreted by myself and the radiologist and shows no PE or other acute abnormality.  BNP normal.  On reevaluation, patient is quite somnolent after receiving morphine.  He did receive Narcan twice without any significant relief.  I am concerned down that morphine could have caused hypercarbic respiratory failure.  Will obtain CT head, ABG and place him on BiPAP.  Blood glucose normal.  8:00 AM  Pt now awake, talking but still somnolent.  Placed on BiPAP by respiratory therapy.  CT head reviewed and interpreted by myself and radiologist and is unremarkable.  ABG pending.  Signed out the oncoming ED physician.   CONSULTS: Pending further workup.  Patient may require hospitalization due to somnolence.   OUTSIDE RECORDS REVIEWED: Reviewed recent pulmonology notes.       FINAL CLINICAL IMPRESSION(S) / ED DIAGNOSES   Final diagnoses:  Atypical chest pain     Rx / DC Orders   ED Discharge Orders     None        Note:  This document was prepared using Dragon voice recognition software and may include unintentional  dictation errors.   Chavonne Sforza, Clover Dao, DO 05/27/24 0800

## 2024-05-27 NOTE — ED Notes (Signed)
 Per Dr. Author Board, patient is up for discharge, but should be allowed to stay in the room until he is fully awake.  Patient response to the morphine was a deep sleep.  Narcan was given to arouse the patient and increase his respiratory drive.  Patient does respond now to voice, but is very drowsy.

## 2024-05-27 NOTE — ED Triage Notes (Signed)
 Patient arrives by [facility] from home complaining of chest pain after eating a spaghetti dinner tonight.  Patient has a hx of acid reflux, but no cardiac hx.  He takes a reflux pill daily.  EMS reports no abnormalities on EKG.

## 2024-05-27 NOTE — ED Notes (Signed)
 RN entered pt room to find pt unresponsive. Per MD give pt 1mg  of narcan.

## 2024-05-27 NOTE — ED Notes (Addendum)
 1mg  of narcan given,. Pt is semi responsive to verbal commands however pt is very lethargic.

## 2024-05-27 NOTE — Discharge Instructions (Addendum)
 [facility] with [facility] pulmonologist and G.I. Use your oxygen, inhalers as before use your CPAP daily HOLD your BP meds if BP <120

## 2024-05-27 NOTE — H&P (Addendum)
 History and Physical    [name] [name] ZOX:[id number] DOB: 1951-09-23 DOA: 05/27/2024  PCP: Pcp, No  Patient coming from: home  I have personally briefly reviewed patient's old medical records in [facility]  Chief Complaint: chest pain   HPI: [name] [name] is a 73 y.o. male with medical history significant of  Lung cancer s/p  left pneumonectomy, CHF , DMII, HLD, HTN , COPD with chronic hypoxic/hypercapnic respiratory failure,GERD,pulmonary hypertension,PTSD, dysthmia who present to ED BIB EMS with complaint of chest pain after eating spaghetti dinner. In the field per EMS stable vitals and EKG w/o any ischemic findings.   Patient unable to give history as patient is still somnolent. Patient does note continue chest discomfort however.  ED Course:  Patient in ED was treated with gi cocktail as well as morphine for chest pain. Patient however s/p morphine became obtunded. ON further evaluation he was noted to have hypercapnic respiratory distress with ph 7.17 / co2 >123.  Patient was then placed on bipap and evaluated by critical care. Critical care adjusted setting repeat gas 7.27/116  due to improvement patient was slated for admission to medicine service. Vitals:  Afeb, hr 103, bp 145/98, sat 100% on ra  pain of 9  WJX:BJYNW tachycardia , pvc, Labs  D-dimer: 1.51 NA 137, K 3.9, Cl 85, glu 142,  Lipase 37  bNP 53.9 Mag 2 CE9,8 CTPE IMPRESSION: 1. Negative for pulmonary embolus. 2. Stable chronic lung disease and postoperative appearance of the chest: - left pneumonectomy with large chronic rim calcified fluid collection in the left hemithorax with chronic mass effect on the mediastinum - right lung Emphysema (ICD10-J43.9). 3.  Aortic Atherosclerosis (ICD10-I70.0). Cxr IMPRESSION: No acute abnormality.  Left pneumonectomy.   CTH: NAD   Tx protonix , maalox, zofran  , morphine   Review of Systems: As per HPI otherwise 10 point review of systems negative.   Past Medical  History:  Diagnosis Date   Cancer (HCC)    lung   CHF (congestive heart failure) (HCC)    Diabetes mellitus without complication (HCC)    Dyspnea    Hyperlipidemia    Hypertension     Past Surgical History:  Procedure Laterality Date   BALLOON DILATION N/A 08/26/2021   Procedure: BALLOON DILATION;  Surgeon: Lajuan Pila, MD;  Location: [facility] ENDOSCOPY;  Service: Endoscopy;  Laterality: N/A;   COLONOSCOPY WITH PROPOFOL  N/A 04/20/2021   Procedure: COLONOSCOPY WITH PROPOFOL ;  Surgeon: Toledo, Alphonsus Jeans, MD;  Location: [facility] ENDOSCOPY;  Service: Gastroenterology;  Laterality: N/A;   ESOPHAGOGASTRODUODENOSCOPY N/A 04/20/2021   Procedure: ESOPHAGOGASTRODUODENOSCOPY (EGD);  Surgeon: Toledo, Alphonsus Jeans, MD;  Location: [facility] ENDOSCOPY;  Service: Gastroenterology;  Laterality: N/A;   ESOPHAGOGASTRODUODENOSCOPY (EGD) WITH PROPOFOL  N/A 08/26/2021   Procedure: ESOPHAGOGASTRODUODENOSCOPY (EGD) WITH PROPOFOL ;  Surgeon: Lajuan Pila, MD;  Location: [facility] ENDOSCOPY;  Service: Endoscopy;  Laterality: N/A;   LUNG REMOVAL, PARTIAL     RIGHT HEART CATH N/A 08/24/2021   Procedure: RIGHT HEART CATH;  Surgeon: Mardell Shade, MD;  Location: [facility] INVASIVE CV LAB;  Service: Cardiovascular;  Laterality: N/A;     reports that he has quit smoking. He has never used smokeless tobacco. He reports current alcohol use. He reports that he does not currently use drugs.  Allergies  Allergen Reactions   Morphine Other (See Comments)    Very sensitive d/t resp changes that cause hypercarbia    History reviewed. No pertinent family history.  Prior to Admission medications   Medication Sig Start Date End Date  Taking? Authorizing Provider  acetaminophen  (TYLENOL ) 325 MG tablet Take 650 mg by mouth every 6 (six) hours as needed for headache or mild pain.   Yes [provider]  albuterol  (VENTOLIN  HFA) 108 (90 Base) MCG/ACT inhaler Inhale 1-2 puffs into the lungs every 6 (six) hours as needed. 04/04/21  Yes [provider]  arformoterol (BROVANA) 15 MCG/2ML NEBU Inhale 15 mcg into the lungs 2 (two) times daily. 05/14/24  Yes [provider]  budesonide (PULMICORT) 0.5 MG/2ML nebulizer solution Inhale 0.5 mg into the lungs daily. 05/14/24  Yes [provider]  carboxymethylcellulose 1 % ophthalmic solution Apply 1 drop to eye as needed. For dry eyes   Yes [provider]  carvedilol  (COREG ) 3.125 MG tablet Take 1 tablet (3.125 mg total) by mouth 2 (two) times daily. 09/16/21  Yes Deforest Fast, MD  Cholecalciferol 10 MCG (400 UNIT) CAPS Take 400 Units by mouth daily.   Yes [provider]  furosemide  (LASIX ) 40 MG tablet Take 1 tablet (40 mg total) by mouth daily for 7 days. 01/02/22  Yes Quillian Brunt, MD  lisinopril  (ZESTRIL ) 10 MG tablet Take 5 mg by mouth daily. 01/15/24  Yes [provider]  lisinopril -hydrochlorothiazide (ZESTORETIC) 20-12.5 MG tablet Take 1 tablet by mouth daily. For high blood preesure 01/27/21  Yes [provider]  loratadine (CLARITIN) 10 MG tablet Take 10 mg by mouth daily. For allegies   Yes [provider]  Multiple Vitamin (MULTIVITAMIN) capsule Take 1 capsule by mouth daily.   Yes [provider]  omeprazole (PRILOSEC) 20 MG capsule Take 20 mg by mouth daily. 12/07/21  Yes [provider]  revefenacin (YUPELRI) 175 MCG/3ML nebulizer solution Take 175 mcg by nebulization daily. 05/14/24  Yes [provider]  simvastatin  (ZOCOR ) 20 MG tablet Take 10 mg by mouth daily.   Yes [provider]  tadalafil (CIALIS) 10 MG tablet Take 10 mg by mouth daily as needed. 10/19/20  Yes [provider]  Ensifentrine  (OHTUVAYRE ) 3 MG/2.5ML SUSP Inhale 3 mg into the lungs 2 (two) times daily. 01/08/24   Quillian Brunt, MD  fluticasone -salmeterol (WIXELA INHUB) 250-50 MCG/ACT AEPB Inhale 1 puff into the lungs in the morning and at bedtime. Patient not taking: Reported on 05/27/2024  01/02/22   Quillian Brunt, MD  pantoprazole  (PROTONIX ) 40 MG tablet Take 40 mg by mouth daily. Patient not taking: Reported on 05/27/2024    [provider]  senna-docusate (SENOKOT-S) 8.6-50 MG tablet Take 1 tablet by mouth at bedtime as needed for mild constipation. Patient not taking: Reported on 05/27/2024 09/15/21   Deforest Fast, MD  Tiotropium Bromide Monohydrate  (SPIRIVA  RESPIMAT) 2.5 MCG/ACT AERS Inhale 2 puffs into the lungs daily. Patient not taking: Reported on 05/27/2024 01/02/22   Quillian Brunt, MD    Physical Exam: Vitals:   05/27/24 0930 05/27/24 1000 05/27/24 1200 05/27/24 1230  BP: 124/76 122/80 101/76 102/71  Pulse: (!) 103 95 79 82  Resp: (!) 21 18 19 19   Temp:      TempSrc:      SpO2: 95% 97% 100% 100%  Weight:      Height:        Constitutional: NAD, calm, comfortable Vitals:   05/27/24 0930 05/27/24 1000 05/27/24 1200 05/27/24 1230  BP: 124/76 122/80 101/76 102/71  Pulse: (!) 103 95 79 82  Resp: (!) 21 18 19 19   Temp:      TempSrc:      SpO2:  95% 97% 100% 100%  Weight:      Height:       Eyes: PERRL, lids and conjunctivae normal ENMT: Mucous membranes are moist. Posterior pharynx clear of any exudate or lesions.Normal dentition.  Neck: normal, supple, no masses, no thyromegaly Respiratory: clear to auscultation bilaterally, no wheezing, no crackles. Normal respiratory effort. No accessory muscle use.  Cardiovascular: Regular rate and rhythm, no murmurs / rubs / gallops. No extremity edema. 2+ pedal pulses.  Abdomen: no tenderness, no masses palpated. No hepatosplenomegaly. Bowel sounds positive.  Musculoskeletal: no clubbing / cyanosis. No joint deformity upper and lower extremities. Good ROM, no contractures. Normal muscle tone.  Skin: no rashes, lesions, ulcers. No induration Neurologic: CN 2-12 grossly intact. Sensation intact,  MAEX4 Psychiatric:  difficult to assess responds to questions but falls of to sleep   Labs on Admission: I  have personally reviewed following labs and imaging studies  CBC: Recent Labs  Lab 05/27/24 0337  WBC 5.1  NEUTROABS 3.8  HGB 10.0*  HCT 33.6*  MCV 96.3  PLT 137*   Basic Metabolic Panel: Recent Labs  Lab 05/27/24 0337  NA 137  K 3.9  CL 85*  CO2 43*  GLUCOSE 142*  BUN 20  CREATININE 0.80  CALCIUM 9.4  MG 2.0   GFR: Estimated Creatinine Clearance: 64.4 mL/min (by C-G formula based on SCr of 0.8 mg/dL). Liver Function Tests: Recent Labs  Lab 05/27/24 0337  AST 23  ALT 19  ALKPHOS 54  BILITOT 0.5  PROT 7.2  ALBUMIN 4.0   Recent Labs  Lab 05/27/24 0337  LIPASE 37   No results for input(s): "AMMONIA" in the last 168 hours. Coagulation Profile: No results for input(s): "INR", "PROTIME" in the last 168 hours. Cardiac Enzymes: No results for input(s): "CKTOTAL", "CKMB", "CKMBINDEX", "TROPONINI" in the last 168 hours. BNP (last 3 results) No results for input(s): "PROBNP" in the last 8760 hours. HbA1C: No results for input(s): "HGBA1C" in the last 72 hours. CBG: Recent Labs  Lab 05/27/24 0718  GLUCAP 128*   Lipid Profile: No results for input(s): "CHOL", "HDL", "LDLCALC", "TRIG", "CHOLHDL", "LDLDIRECT" in the last 72 hours. Thyroid Function Tests: No results for input(s): "TSH", "T4TOTAL", "FREET4", "T3FREE", "THYROIDAB" in the last 72 hours. Anemia Panel: No results for input(s): "VITAMINB12", "FOLATE", "FERRITIN", "TIBC", "IRON", "RETICCTPCT" in the last 72 hours. Urine analysis:    Component Value Date/Time   BILIRUBINUR small (A) 09/30/2021 1510   KETONESUR trace (5) (A) 09/30/2021 1510   PROTEINUR =100 (A) 09/30/2021 1510   UROBILINOGEN 1.0 09/30/2021 1510   NITRITE Negative 09/30/2021 1510   LEUKOCYTESUR Large (3+) (A) 09/30/2021 1510    Radiological Exams on Admission: CT HEAD WO CONTRAST ( ) Result Date: 05/27/2024 CLINICAL DATA:  Altered mental status. EXAM: CT HEAD WITHOUT CONTRAST TECHNIQUE: Contiguous axial images were obtained from  the base of the skull through the vertex without intravenous contrast. RADIATION DOSE REDUCTION: This exam was performed according to the departmental dose-optimization program which includes automated exposure control, adjustment of the mA and/or kV according to patient size and/or use of iterative reconstruction technique. COMPARISON:  None Available. FINDINGS: Brain: No evidence of intracranial hemorrhage, acute infarction, hydrocephalus, extra-axial collection, or mass lesion/mass effect. Vascular:  No hyperdense vessel or other acute findings. Skull: No evidence of fracture or other significant bone abnormality. Sinuses/Orbits:  No acute findings. Other: None. IMPRESSION: Negative noncontrast head CT. Electronically Signed   By: Marlyce Sine M.D.   On: 05/27/2024 07:47  CT Angio Chest PE W and/or Wo Contrast Result Date: 05/27/2024 CLINICAL DATA:  73 year old male with chest pain and shortness of breath. History of a left pneumonectomy with residual large calcified left pleural rind/collection. EXAM: CT ANGIOGRAPHY CHEST WITH CONTRAST TECHNIQUE: Multidetector CT imaging of the chest was performed using the standard protocol during bolus administration of intravenous contrast. Multiplanar CT image reconstructions and MIPs were obtained to evaluate the vascular anatomy. RADIATION DOSE REDUCTION: This exam was performed according to the departmental dose-optimization program which includes automated exposure control, adjustment of the mA and/or kV according to patient size and/or use of iterative reconstruction technique. CONTRAST:  75mL OMNIPAQUE  IOHEXOL  350 MG/ML SOLN COMPARISON:  CTA chest 09/25/2014. Portable chest radiographs 0325 hours today. FINDINGS: Cardiovascular: Excellent contrast bolus timing in the pulmonary arterial tree. 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 No pulmonary artery filling defect identified. Chronic intrathoracic mass  effect from left lung findings (below) stable since 2015. Heart size remains within normal limits. No pericardial effusion. Stable visible aorta, negative aside from atherosclerosis and surrounding postoperative changes. Mediastinum/Nodes: Chronic mediastinal shift and postoperative changes with no significant change since 2015. Lungs/Pleura: Chronic large and rim calcified oval fluid collection occupying the left hemithorax with chronic mass effect on the mediastinum appears unchanged from a 2022 CT with contrast and has low to intermediate density fluid content as before (coronal image 59). Left pneumonectomy changes superimposed, blind-ending left mainstem bronchus unchanged from prior exams. Trachea and right lung major airways remain patent. Centrilobular right lung emphysema. No right pleural effusion, mild dependent atelectasis now in the right costophrenic angle. But no right pleural effusion and otherwise no acute or suspicious right lung opacity. Upper Abdomen: Partially visible small but circumscribed low-density area in the posterior right hepatic lobe on series 5, image 200 is most compatible with benign cyst or hemangioma (no follow-up imaging recommended). Otherwise negative visible essentially noncontrast liver, gallbladder, spleen, pancreas, adrenal glands and kidneys. Upper abdominal stomach and bowel contents mixed with some oral contrast type material. No pneumoperitoneum or upper abdominal free fluid identified. Musculoskeletal: Stable visualized osseous structures. Review of the MIP images confirms the above findings. IMPRESSION: 1. Negative for pulmonary embolus. 2. Stable chronic lung disease and postoperative appearance of the chest: - left pneumonectomy with large chronic rim calcified fluid collection in the left hemithorax with chronic mass effect on the mediastinum - right lung Emphysema (ICD10-J43.9). 3.  Aortic Atherosclerosis (ICD10-I70.0). Electronically Signed   By: Marlise Simpers M.D.    On: 05/27/2024 05:26   DG Chest Portable 1 View Result Date: 05/27/2024 CLINICAL DATA:  Chest pain and shortness of breath EXAM: PORTABLE CHEST 1 VIEW COMPARISON:  Radiograph 01/07/2024 FINDINGS: Left pneumonectomy with complete opacification of the left hemithorax. The right lung is clear. Stable cardiomediastinal silhouette. No displaced rib fractures. IMPRESSION: No acute abnormality.  Left pneumonectomy. Electronically Signed   By: Rozell Cornet M.D.   On: 05/27/2024 03:34    EKG: Independently reviewed. See above  Assessment/Plan  Acute on chronic hypercapnic respiratory failure due to medication side-effect in setting of poor reserve Acute hypoxic respiratory failure nos  -admit to step down  -continue on bipap , wean per protocol  - patient also has improved mentation  -consider pulmonary consult in am  -repeat abg  -CTPE notes no infection / NAD -unable to wean from bipap desat quickly    Concern for dysphagia  - bedside swallow , note to have desat , no  coughing  - npo  -speech to see   Chest pain , atypical  - preliminary ischemic work up negative  - presumed due to know history of GERD - will continue with protonix  iv, add Carafate as able  -will cycle ce and f/u echo as patient  note some discomfort    COPD with chronic hypercapnic respiratory failure - no acute flare  -resume chronic inhalers    Lung cancer s/p  left pneumonectomy  CHFpef  -well compensated  -resume lasix  ,zestroretic   DMII -diet controlled  -iss/fs  -fs on lower side  -will place on d5/ns   HLD, -continue zocor    HTN -stable  -resume home regimen once med rec  completed   PTSD Dysthmia -no acute decompensation   DVT prophylaxis: heparin   Code Status: full/ as discussed per patient wishes in event of cardiac arrest  Family Communication: .none at beside Disposition Plan: patient  expected to be admitted greater than 2 midnights  Consults called: critical  care Admission status:step down    Sabas Cradle MD [facility]   If 7PM-7AM, please contact night-coverage [email or web address] Password [facility]  05/27/2024, 12:42 PM

## 2024-05-27 NOTE — Progress Notes (Signed)
 Notified Dr.Duncan about critical troponin level and abnormal ABG. New orders placed. Patient has received iv tylenol . Patient has requested to eat, explained to patient the necessity for bipap. Patient complains of chest pain 7/10 but agrees he should not have morphine. Patient is amenable to having tylenol . EKG performed and evaluated by MD. Verbal orders for ABG placed. Patient is currently resting while on bipap.

## 2024-05-27 NOTE — ED Provider Notes (Addendum)
 7:53 AM Assumed care for off going team.   Blood pressure 119/74, pulse (!) 103, temperature 98 F (36.7 C), resp. rate 18, height 5\' 9"  (1.753 m), weight 53 kg, SpO2 99%.  See their HPI for full report but in brief pending re-eval due to AMS.   Patient initially came in talking with complaint of chest pain.  Patient was given morphine for pain but afterwards patient became less arousable.  Patient was trialed on some Narcan which did help with patient's respiratory rate but patient still nonverbal although does awaken to voice and looks around.  The concern from the outgoing doctor is possible hypercapnia therefore patient was placed on BiPAP while awaiting ABG CT head was ordered without any evidence of intercranial hemorrhage.  9:00 AM patient still remains nonverbal but upon repeat assessment he is following some commands.  He is able to wiggle his toes.  I will discuss with hospitalist for stepdown admission   Patient had episode where he was seemingly more altered.  He did not awaken to pinching.  We tried some Narcan and he had slight improvement and started patient on Narcan infusion.  We got a repeat VBG that was very similar when I would go reevaluate patient for possible intubation given unchanged VBG patient is actually now alert and oriented x 3 following commands.  At this time I do not feel we need to intubate patient given significant improvement in mental status I think patient could need some more time on BiPAP.  I will discuss with the ICU for admission given high risk for needing intubation.  Discussed with ICU and they wanted to repeat ABG given  10:55 AM repeat gas is improving and his repeat exam is still improving with him able to say his name and that he is at a hospital and follow commands therefore we will discuss the hospital team and hold off on intubation at this time.  I did attempt to call patient's family emergency contact but they did not pick up.  .Critical  Care  Performed by: Lubertha Rush, MD Authorized by: Lubertha Rush, MD   Critical care provider statement:    Critical care time (minutes):  30   Critical care was necessary to treat or prevent imminent or life-threatening deterioration of the following conditions:  Respiratory failure   Critical care was time spent personally by me on the following activities:  Development of treatment plan with patient or surrogate, discussions with consultants, evaluation of patient's response to treatment, examination of patient, ordering and review of laboratory studies, ordering and review of radiographic studies, ordering and performing treatments and interventions, pulse oximetry, re-evaluation of patient's condition and review of old charts     Lubertha Rush, MD 05/27/24 1056

## 2024-05-27 NOTE — Progress Notes (Addendum)
       CROSS COVER NOTE  NAME: [name] [name] MRN: [id number] DOB : [birth year]    Concern as stated by nurse / staff   his troponin last troponin is critical it went from 8 to 33 and will you review his ABG. Thank you      Pertinent findings on chart review: Admitted earlier with the following diagnoses: 1-acute on chronic hypercapnic respiratory failure due to medication side-effect in setting of poor reserve 2-acute hypoxic respiratory failure nos 3-chest pain , atypical  4-COPD with chronic hypercapnic respiratory failure 5-lung cancer s/p  left pneumonectomy     05/27/2024   10:00 PM 05/27/2024    9:00 PM 05/27/2024    6:00 PM  Vitals with BMI  Systolic 116 92   Diastolic 73 75   Pulse 88 85 87   ABG    Component Value Date/Time   PHART 7.39 05/27/2024 1953   PCO2ART 93 (HH) 05/27/2024 1953   PO2ART 74 (L) 05/27/2024 1953   HCO3 56.3 (H) 05/27/2024 1953   TCO2 43 (H) 08/24/2021 1234   TCO2 45 (H) 08/24/2021 1234   O2SAT 97.3 05/27/2024 1953   Cardiac Panel (last 3 results) Recent Labs    05/27/24 0526 05/27/24 1800 05/27/24 2058  TROPONINIHS 8 33* 39*     Patient Assessment  Patient awake and alert and mentating well, per nurse .  Assessment and  Interventions   Assessment:  Acute on chronic respiratory failure with hypoxia and hypercapnia and respiratory acidosis Chest pain, believe atypical, troponin trend not quite ACS 8->33->39  Plan: Continue current management Suspect chest pain and slightly elevated troponin related to respiratory failure Continue BiPAP .  CO2 downtrending and respiratory acidosis resolved Continue respiratory management Continue close monitoring

## 2024-05-28 ENCOUNTER — Inpatient Hospital Stay
Admit: 2024-05-28 | Discharge: 2024-05-28 | Disposition: A | Discharge status: Home / Self Care | Attending: Internal Medicine | Admitting: Internal Medicine

## 2024-05-28 DIAGNOSIS — I10 Essential (primary) hypertension: Secondary | ICD-10-CM

## 2024-05-28 DIAGNOSIS — R0789 Other chest pain: Principal | ICD-10-CM

## 2024-05-28 DIAGNOSIS — J441 Chronic obstructive pulmonary disease with (acute) exacerbation: Secondary | ICD-10-CM

## 2024-05-28 DIAGNOSIS — R079 Chest pain, unspecified: Secondary | ICD-10-CM

## 2024-05-28 DIAGNOSIS — J9622 Acute and chronic respiratory failure with hypercapnia: Secondary | ICD-10-CM | POA: Diagnosis not present

## 2024-05-28 LAB — CBC
HCT: 30.6 % — ABNORMAL LOW (ref 39.0–52.0)
Hemoglobin: 9.1 g/dL — ABNORMAL LOW (ref 13.0–17.0)
MCH: 28.7 pg (ref 26.0–34.0)
MCHC: 29.7 g/dL — ABNORMAL LOW (ref 30.0–36.0)
MCV: 96.5 fL (ref 80.0–100.0)
Platelets: 133 10*3/uL — ABNORMAL LOW (ref 150–400)
RBC: 3.17 MIL/uL — ABNORMAL LOW (ref 4.22–5.81)
RDW: 11.9 % (ref 11.5–15.5)
WBC: 4.8 10*3/uL (ref 4.0–10.5)
nRBC: 0 % (ref 0.0–0.2)

## 2024-05-28 LAB — BLOOD GAS, ARTERIAL
Acid-Base Excess: 26.6 mmol/L — ABNORMAL HIGH (ref 0.0–2.0)
Bicarbonate: 57.6 mmol/L — ABNORMAL HIGH (ref 20.0–28.0)
Delivery systems: POSITIVE
Expiratory PAP: 10 cmH2O
FIO2: 35 %
MECHVT: 450 mL
Mechanical Rate: 20
O2 Saturation: 94.1 %
Patient temperature: 37
pCO2 arterial: 93 mmHg (ref 32–48)
pH, Arterial: 7.4 (ref 7.35–7.45)
pO2, Arterial: 63 mmHg — ABNORMAL LOW (ref 83–108)

## 2024-05-28 LAB — COMPREHENSIVE METABOLIC PANEL WITH GFR
ALT: 22 U/L (ref 0–44)
AST: 25 U/L (ref 15–41)
Albumin: 3.5 g/dL (ref 3.5–5.0)
Alkaline Phosphatase: 55 U/L (ref 38–126)
Anion gap: 10 (ref 5–15)
BUN: 22 mg/dL (ref 8–23)
CO2: 40 mmol/L — ABNORMAL HIGH (ref 22–32)
Calcium: 9.3 mg/dL (ref 8.9–10.3)
Chloride: 88 mmol/L — ABNORMAL LOW (ref 98–111)
Creatinine, Ser: 0.83 mg/dL (ref 0.61–1.24)
GFR, Estimated: >60 mL/min (ref >60)
Glucose, Bld: 94 mg/dL (ref 70–99)
Potassium: 4.5 mmol/L (ref 3.5–5.1)
Sodium: 138 mmol/L (ref 135–145)
Total Bilirubin: 0.9 mg/dL (ref 0.0–1.2)
Total Protein: 6.4 g/dL — ABNORMAL LOW (ref 6.5–8.1)

## 2024-05-28 LAB — GLUCOSE, CAPILLARY
Glucose-Capillary: 82 mg/dL (ref 70–99)
Glucose-Capillary: 95 mg/dL (ref 70–99)
Glucose-Capillary: 103 mg/dL — ABNORMAL HIGH (ref 70–99)

## 2024-05-28 LAB — ECHOCARDIOGRAM COMPLETE
Height: 69 in
S' Lateral: 2.9 cm
Weight: 1868.8 [oz_av]

## 2024-05-28 MED ORDER — ENSURE PLUS HIGH PROTEIN PO LIQD
237.0000 mL | Freq: Three times a day (TID) | ORAL | Status: DC
  Administered 2024-05-28 – 2024-05-29 (×4): 237 mL via ORAL

## 2024-05-28 MED ORDER — REVEFENACIN 175 MCG/3ML IN SOLN
175.0000 ug | Freq: Every day | RESPIRATORY_TRACT | Status: DC
  Administered 2024-05-28 – 2024-05-31 (×4): 175 ug via RESPIRATORY_TRACT
  Filled 2024-05-28 (×4): qty 3

## 2024-05-28 MED ORDER — PANTOPRAZOLE SODIUM 40 MG PO TBEC
40.0000 mg | DELAYED_RELEASE_TABLET | Freq: Every day | ORAL | Status: DC
  Administered 2024-05-29 – 2024-05-31 (×3): 40 mg via ORAL
  Filled 2024-05-28 (×3): qty 1

## 2024-05-28 MED ORDER — VITAMIN D 25 MCG (1000 UNIT) PO TABS
500.0000 [IU] | ORAL_TABLET | Freq: Every day | ORAL | Status: DC
  Administered 2024-05-28 – 2024-05-31 (×4): 500 [IU] via ORAL
  Filled 2024-05-28 (×4): qty 1

## 2024-05-28 NOTE — Progress Notes (Signed)
 Per Dr. Lydia Sams RN to DC head MRI as pt is alert and oriented this morning, and place order to move pt to [facility].

## 2024-05-28 NOTE — Evaluation (Addendum)
 Clinical/Bedside Swallow Evaluation Patient Details  Name: [name] [name] MRN: [id number] Date of Birth: [birth year]  Today's Date: 05/28/2024 Time: SLP Start Time (ACUTE ONLY): 0955 SLP Stop Time (ACUTE ONLY): 1035 SLP Time Calculation (min) (ACUTE ONLY): 40 min  Past Medical History:  Past Medical History:  Diagnosis Date   Cancer (HCC)    lung   CHF (congestive heart failure) (HCC)    Diabetes mellitus without complication (HCC)    Dyspnea    Hyperlipidemia    Hypertension    Past Surgical History:  Past Surgical History:  Procedure Laterality Date   BALLOON DILATION N/A 08/26/2021   Procedure: BALLOON DILATION;  Surgeon: Lajuan Pila, MD;  Location: [facility] ENDOSCOPY;  Service: Endoscopy;  Laterality: N/A;   COLONOSCOPY WITH PROPOFOL  N/A 04/20/2021   Procedure: COLONOSCOPY WITH PROPOFOL ;  Surgeon: Toledo, Alphonsus Jeans, MD;  Location: [facility] ENDOSCOPY;  Service: Gastroenterology;  Laterality: N/A;   ESOPHAGOGASTRODUODENOSCOPY N/A 04/20/2021   Procedure: ESOPHAGOGASTRODUODENOSCOPY (EGD);  Surgeon: Toledo, Alphonsus Jeans, MD;  Location: [facility] ENDOSCOPY;  Service: Gastroenterology;  Laterality: N/A;   ESOPHAGOGASTRODUODENOSCOPY (EGD) WITH PROPOFOL  N/A 08/26/2021   Procedure: ESOPHAGOGASTRODUODENOSCOPY (EGD) WITH PROPOFOL ;  Surgeon: Lajuan Pila, MD;  Location: [facility] ENDOSCOPY;  Service: Endoscopy;  Laterality: N/A;   LUNG REMOVAL, PARTIAL     RIGHT HEART CATH N/A 08/24/2021   Procedure: RIGHT HEART CATH;  Surgeon: Mardell Shade, MD;  Location: [facility] INVASIVE CV LAB;  Service: Cardiovascular;  Laterality: N/A;   HPI:  Pt is a 73 y.o. male with medical history significant of  Lung cancer s/p Left pneumonectomy, GERD and a tortuous Esophagus per HYQ(6578), CHF , DMII, HLD, HTN , COPD with chronic hypoxic/hypercapnic respiratory failure,GERD,pulmonary hypertension,PTSD, dysthmia who present to ED BIB EMS with complaint of chest pain after eating Spaghetti dinner.  In the field per EMS stable vitals and EKG w/o  any ischemic findings. Patient noted continued chest discomfort.  ED Course: Patient in ED was treated with gi cocktail as well as Morphine for chest pain. Patient however s/p morphine became obtunded. On further evaluation he was noted to have hypercapnic respiratory distress with ph 7.17 / co2 >123.   Patient was then placed on bipap and evaluated by critical care. Critical care adjusted setting repeat gas 7.27/116  due to improvement patient was slated for admission to medicine service.   Chest CT imaging:  Stable chronic lung disease and postoperative appearance of the  chest:  - left pneumonectomy with large chronic rim calcified fluid  collection in the left hemithorax with chronic mass effect on the  mediastinum  - right lung Emphysema.  EGDs(2) in 2022:  tortuous Esophagus in the lower third of the Esophagus; presbyesophagus; hiatal hernia. Dilation performed 08/2021.    Assessment / Plan / Recommendation  Clinical Impression    Pt seen for BSE this morning. pT was awake, verbal and followed directions. He described his discomfort in his mid-sternum area and stated he wanted to be transferred to the [facility] hospital if they don't know what is wrong w/ me here. NSG reported Belching but no swallowing problems w/ meds this morning. Belching was noted post trials w/ this SLP.  On [place] O2 support- 3L; afebrile, WBC not elevated.    OF NOTE: Pt strongly presents w/ s/s of REFLUX: MOD+ Belching, Globus in mid-sternum and below w/ sips of liquids and foods. Pt stated he takes a PPI at home.  OF NOTE: Per EGDs(2) in 2022: Tortuous Esophagus, presbyesophagus, and hiatal hernia. This kind of Esophageal  phase Dysmotility can significantly impact clearing of food/drink as it passes through the Esophagus as well as can increase pain/discomfort in the mid-sternum area as slows or stasis in the Esophagus. This was noted by SLP notes in 2022 w/ recommendation to f/u w/ OP GI -- unsure if pt followed up w/ GI(pt did not  describe such f/u when asked). Pt did not endorse insight into his Esophageal phase Dysmotility as dx'd per chart notes/Imaging.   Pt appears to present w/ functional oropharyngeal phase swallowing w/ No overt oropharyngeal phase dysphagia appreciated during oral intake of trials; No neuromuscular swallowing deficits appreciated. Pt appears at reduced risk for aspiration from an oropharyngeal phase standpoint following general aspiration precautions. HOWEVER, pt has a baseline presentation of GERD/REFLUX and episodes of REFLUX behavior, Globus, and Esophageal phase Dysmotility history(see EGDs in imaging). ANY Dysmotility or Regurgitation of Reflux material can increase risk for aspiration of the Reflux material during Retrograde flow thus impact Voicing and Pulmonary status.     Pt sat upright in bed and consumed several trials of thin liquids Via Straw and soft solids w/ breakfast meal. No overt clinical s/s of aspiration noted; clear vocal quality b/t trials, no decline in pulmonary status, no decline in O2 sats(98%). Oral phase appeared [facility] for bolus management and timely A-P transfer/clearing of material. OM exam was [facility] for oral clearing; no unilateral, weak lingual/labial movements. Speech clear.    Recommend continue a fairly Regular diet (w/ Cut, moistened foods) w/ thin liquids. General aspiration precautions. GERD/REFLUX precautions. Rest Breaks during meals/oral intake to allow for Esophageal clearing. PPI discussed w/ pt. Do not lie down for ~1 hour post meals. Find foods comfortable for Esophageal clearing -- less problematic foods such as meats, breads.   Recommend pt f/u w/ GI for assessment/management of GERD/REFLUX and further Education on his Esophageal phase Dysmotility (tortuous esophagus per EGD); and tx as indicated. MD/NSG updated; MD to reconsult ST services if any new needs while admitted. Recommend Dietician f/u.  SLP Visit Diagnosis: Dysphagia, unspecified (R13.10) (Tortuous  Esophagus(2022); presbyesophagus, hiatal hernia also noted)    Aspiration Risk   (reduced from an oropharyngeal phase standpoint but increased risk for REFLUX aspiration)    Diet Recommendation   Thin;Age appropriate regular (cut/gravies; soft manageable foods(of ease)) = a fairly Regular diet (w/ Cut, moistened foods) w/ thin liquids. General aspiration precautions. GERD/REFLUX precautions. Rest Breaks during meals/oral intake to allow for Esophageal clearing. Do not lie down for ~1 hour post meals. Find foods comfortable for Esophageal clearing -- less problematic foods such as meats, breads.  Medication Administration: Whole meds with puree (vs need to crush for esophageal clearing)    Other  Recommendations Recommended Consults: Consider GI evaluation;Consider esophageal assessment (Dietician) Oral Care Recommendations: Oral care BID;Patient independent with oral care     Assistance Recommended at Discharge  PRN  Functional Status Assessment Patient has had a recent decline in their functional status and/or demonstrates limited ability to make significant improvements in function in a reasonable and predictable amount of time (GI dysmotility)  Frequency and Duration  (n/a)   (n/a)       Prognosis Prognosis for improved oropharyngeal function: Fair Barriers to Reach Goals: Time post onset;Severity of deficits;Behavior Barriers/Prognosis Comment: Tortuous Esophagus(2022); presbyesophagus, hiatal hernia per chart      Swallow Study   General Date of Onset: 05/27/24 HPI: Pt is a 73 y.o. male with medical history significant of  Lung cancer s/p Left pneumonectomy, GERD and a tortuous Esophagus  per WUJ(8119), CHF , DMII, HLD, HTN , COPD with chronic hypoxic/hypercapnic respiratory failure,GERD,pulmonary hypertension,PTSD, dysthmia who present to ED BIB EMS with complaint of chest pain after eating Spaghetti dinner.  In the field per EMS stable vitals and EKG w/o any ischemic findings.  Patient noted continued chest discomfort.  ED Course: Patient in ED was treated with gi cocktail as well as Morphine for chest pain. Patient however s/p morphine became obtunded. On further evaluation he was noted to have hypercapnic respiratory distress with ph 7.17 / co2 >123.   Patient was then placed on bipap and evaluated by critical care. Critical care adjusted setting repeat gas 7.27/116  due to improvement patient was slated for admission to medicine service.   Chest CT imaging:  Stable chronic lung disease and postoperative appearance of the  chest:  - left pneumonectomy with large chronic rim calcified fluid  collection in the left hemithorax with chronic mass effect on the  mediastinum  - right lung Emphysema.  EGDs(2) in 2022:  tortuous Esophagus in the lower third of the Esophagus; presbyesophagus; hiatal hernia. Dilation performed 08/2021. Type of Study: Bedside Swallow Evaluation Previous Swallow Assessment: 2022 at [facility] w/ regular diet rec'd and SLP reinforced basic precautions of dysmotility, but also introduced topic of good food choices, avoiding acidic, processed foods and caffeine for a while to see if his digestive discomfort improves. Pt had many questions about this and could benefit from f/u with OP GI.Aaron Aas Diet Prior to this Study: Regular;Thin liquids (Level 0) Temperature Spikes Noted: No (wbc 4.8) Respiratory Status: Nasal cannula (3L) History of Recent Intubation: No Behavior/Cognition: Alert;Cooperative;Pleasant mood Oral Cavity Assessment: Within Functional Limits Oral Care Completed by SLP: Recent completion by staff Oral Cavity - Dentition: Adequate natural dentition Vision: Functional for self-feeding Self-Feeding Abilities: Able to feed self Patient Positioning: Upright in bed Baseline Vocal Quality: Normal Volitional Cough: Strong Volitional Swallow: Able to elicit    Oral/Motor/Sensory Function Overall Oral Motor/Sensory Function: Within functional limits (w/  bolus management and speech; no unilateral weakness)   Ice Chips Ice chips: Not tested   Thin Liquid Thin Liquid: Within functional limits Presentation: Self Fed;Straw (~3 ozs+)    Nectar Thick Nectar Thick Liquid: Not tested   Honey Thick Honey Thick Liquid: Not tested   Puree Puree: Not tested   Solid     Solid: Within functional limits Presentation: Self Fed;Spoon (2 boluses)         Darla Edward, MS, CCC-SLP Speech Language Pathologist Rehab Services; [facility] [phone] (ascom) Asim Gersten 05/28/2024,11:35 AM

## 2024-05-28 NOTE — Plan of Care (Signed)
 Pt transferred to unit A&O x 4, follows commands alert.   -blood sugar checks now 3x/day -pt encouraged to eat food and ambulate with staff and PT but was resistant    Problem: Education: Goal: Knowledge of General Education information will improve Description: Including pain rating scale, medication(s)/side effects and non-pharmacologic comfort measures Outcome: Progressing   Problem: Health Behavior/Discharge Planning: Goal: Ability to manage health-related needs will improve Outcome: Progressing   Problem: Clinical Measurements: Goal: Ability to maintain clinical measurements within normal limits will improve Outcome: Progressing Goal: Will remain free from infection Outcome: Progressing Goal: Diagnostic test results will improve Outcome: Progressing Goal: Respiratory complications will improve Outcome: Progressing Goal: Cardiovascular complication will be avoided Outcome: Progressing   Problem: Activity: Goal: Risk for activity intolerance will decrease Outcome: Progressing   Problem: Nutrition: Goal: Adequate nutrition will be maintained Outcome: Progressing   Problem: Coping: Goal: Level of anxiety will decrease Outcome: Progressing   Problem: Elimination: Goal: Will not experience complications related to bowel motility Outcome: Progressing Goal: Will not experience complications related to urinary retention Outcome: Progressing   Problem: Pain Managment: Goal: General experience of comfort will improve and/or be controlled Outcome: Progressing   Problem: Safety: Goal: Ability to remain free from injury will improve Outcome: Progressing   Problem: Skin Integrity: Goal: Risk for impaired skin integrity will decrease Outcome: Progressing

## 2024-05-28 NOTE — Progress Notes (Signed)
*  PRELIMINARY RESULTS* Echocardiogram 2D Echocardiogram has been performed.  Broadus Canes 05/28/2024, 2:33 PM

## 2024-05-28 NOTE — Progress Notes (Addendum)
 PT Cancellation Note  Patient Details Name: [name] [name] MRN: [id number] DOB: October 28, 1951   Cancelled Treatment:    Reason Eval/Treat Not Completed: Other (comment) Upon introduction pt states he didn't sleep at all last night and states To he honest I don't feel like doing anything right now.  He was potentially open to the idea of working with PT this afternoon, will maintain on caseload and attempt to see as time allows and pt is willing.     PM update: attempted to see again ~1400, he continues to state I don't feel like doing anything and continues to defer working with PT today.    Darice Edelman, DPT 05/28/2024, 10:50 AM

## 2024-05-28 NOTE — Plan of Care (Signed)
  Problem: Education: Goal: Knowledge of General Education information will improve Description: Including pain rating scale, medication(s)/side effects and non-pharmacologic comfort measures Outcome: Not Progressing   Problem: Clinical Measurements: Goal: Ability to maintain clinical measurements within normal limits will improve Outcome: Not Progressing Goal: Respiratory complications will improve Outcome: Progressing   Problem: Activity: Goal: Risk for activity intolerance will decrease Outcome: Progressing

## 2024-05-28 NOTE — Progress Notes (Signed)
 Pt transfer to room 146. Report given to [facility].

## 2024-05-28 NOTE — Progress Notes (Signed)
 [facility]  - [place] at [facility]   PATIENT NAME: [name] [name]    MR#:  [id number]  DATE OF BIRTH:  14-Aug-1951  SUBJECTIVE:  no family at bedside. Spoke with daughter [name] [name] on the phone. Came in with chest pain and where the ER course became more lethargic found to have elevated ABG panel placed on BiPAP and now much improved with mentation able to answer all questions appropriately. Appears generalized weakness and tired since not get some rest yesterday.    VITALS:  Blood pressure (!) 156/88, pulse (!) 105, temperature 98.1 F (36.7 C), temperature source Oral, resp. rate 16, height 5' 9 (1.753 m), weight 53 kg, SpO2 99%.  PHYSICAL EXAMINATION:   GENERAL:  73 y.o.-year-old patient with no acute distress.  LUNGS: distant breath sounds bilaterally, no wheezing CARDIOVASCULAR: S1, S2 normal. No murmur   ABDOMEN: Soft, nontender, nondistended. Bowel sounds present.  EXTREMITIES: No  edema b/l.    NEUROLOGIC: nonfocal  patient is alert and awake SKIN: per RN  LABORATORY PANEL:  CBC Recent Labs  Lab 05/28/24 0323  WBC 4.8  HGB 9.1*  HCT 30.6*  PLT 133*    Chemistries  Recent Labs  Lab 05/27/24 1800 05/28/24 0323  NA 140 138  K 4.7 4.5  CL 87* 88*  CO2 43* 40*  GLUCOSE 76 94  BUN 21 22  CREATININE 0.73 0.83  CALCIUM 9.2 9.3  MG 2.2  --   AST 25 25  ALT 22 22  ALKPHOS 56 55  BILITOT 0.6 0.9   Cardiac Enzymes No results for input(s): TROPONINI in the last 168 hours. RADIOLOGY:  DG Chest Port 1 View Result Date: 05/27/2024 CLINICAL DATA:  Hypoxemia EXAM: PORTABLE CHEST 1 VIEW COMPARISON:  Chest radiograph dated 05/27/2024 at 3:25 a.m. FINDINGS: Similar complete opacification of the left hemithorax demonstrating peripheral calcification with continued rightward deviation of the trachea and mediastinum status post left pneumonectomy. Right lung is well aerated. No focal consolidations. No right pleural effusion or pneumothorax. Left heart  border is obscured. No acute osseous abnormality. IMPRESSION: 1. Similar postsurgical changes of left pneumonectomy with complete opacification of the left hemithorax. 2. No focal consolidation of the right lung. Electronically Signed   By: Limin  Xu M.D.   On: 05/27/2024 19:48   CT HEAD WO CONTRAST ( ) Result Date: 05/27/2024 CLINICAL DATA:  Altered mental status. EXAM: CT HEAD WITHOUT CONTRAST TECHNIQUE: Contiguous axial images were obtained from the base of the skull through the vertex without intravenous contrast. RADIATION DOSE REDUCTION: This exam was performed according to the departmental dose-optimization program which includes automated exposure control, adjustment of the mA and/or kV according to patient size and/or use of iterative reconstruction technique. COMPARISON:  None Available. FINDINGS: Brain: No evidence of intracranial hemorrhage, acute infarction, hydrocephalus, extra-axial collection, or mass lesion/mass effect. Vascular:  No hyperdense vessel or other acute findings. Skull: No evidence of fracture or other significant bone abnormality. Sinuses/Orbits:  No acute findings. Other: None. IMPRESSION: Negative noncontrast head CT. Electronically Signed   By: Marlyce Sine M.D.   On: 05/27/2024 07:47   CT Angio Chest PE W and/or Wo Contrast Result Date: 05/27/2024 CLINICAL DATA:  73 year old male with chest pain and shortness of breath. History of a left pneumonectomy with residual large calcified left pleural rind/collection. EXAM: CT ANGIOGRAPHY CHEST WITH CONTRAST TECHNIQUE: Multidetector CT imaging of the chest was performed using the standard protocol during bolus administration of intravenous contrast. Multiplanar CT image reconstructions and MIPs were  obtained to evaluate the vascular anatomy. RADIATION DOSE REDUCTION: This exam was performed according to the departmental dose-optimization program which includes automated exposure control, adjustment of the mA and/or kV according to  patient size and/or use of iterative reconstruction technique. CONTRAST:  75mL OMNIPAQUE  IOHEXOL  350 MG/ML SOLN COMPARISON:  CTA chest 09/25/2014. Portable chest radiographs 0325 hours today. FINDINGS: Cardiovascular: Excellent contrast bolus timing in the pulmonary arterial tree. 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 No pulmonary artery filling defect identified. Chronic intrathoracic mass effect from left lung findings (below) stable since 2015. Heart size remains within normal limits. No pericardial effusion. Stable visible aorta, negative aside from atherosclerosis and surrounding postoperative changes. Mediastinum/Nodes: Chronic mediastinal shift and postoperative changes with no significant change since 2015. Lungs/Pleura: Chronic large and rim calcified oval fluid collection occupying the left hemithorax with chronic mass effect on the mediastinum appears unchanged from a 2022 CT with contrast and has low to intermediate density fluid content as before (coronal image 59). Left pneumonectomy changes superimposed, blind-ending left mainstem bronchus unchanged from prior exams. Trachea and right lung major airways remain patent. Centrilobular right lung emphysema. No right pleural effusion, mild dependent atelectasis now in the right costophrenic angle. But no right pleural effusion and otherwise no acute or suspicious right lung opacity. Upper Abdomen: Partially visible small but circumscribed low-density area in the posterior right hepatic lobe on series 5, image 200 is most compatible with benign cyst or hemangioma (no follow-up imaging recommended). Otherwise negative visible essentially noncontrast liver, gallbladder, spleen, pancreas, adrenal glands and kidneys. Upper abdominal stomach and bowel contents mixed with some oral contrast type material. No pneumoperitoneum or upper abdominal free fluid identified. Musculoskeletal: Stable visualized  osseous structures. Review of the MIP images confirms the above findings. IMPRESSION: 1. Negative for pulmonary embolus. 2. Stable chronic lung disease and postoperative appearance of the chest: - left pneumonectomy with large chronic rim calcified fluid collection in the left hemithorax with chronic mass effect on the mediastinum - right lung Emphysema (ICD10-J43.9). 3.  Aortic Atherosclerosis (ICD10-I70.0). Electronically Signed   By: Marlise Simpers M.D.   On: 05/27/2024 05:26   DG Chest Portable 1 View Result Date: 05/27/2024 CLINICAL DATA:  Chest pain and shortness of breath EXAM: PORTABLE CHEST 1 VIEW COMPARISON:  Radiograph 01/07/2024 FINDINGS: Left pneumonectomy with complete opacification of the left hemithorax. The right lung is clear. Stable cardiomediastinal silhouette. No displaced rib fractures. IMPRESSION: No acute abnormality.  Left pneumonectomy. Electronically Signed   By: Rozell Cornet M.D.   On: 05/27/2024 03:34    Assessment and Plan  [name] [name] is a 73 y.o. male with medical history significant of  Lung cancer s/p  left pneumonectomy, CHF , DMII, HLD, HTN , COPD with chronic hypoxic/hypercapnic respiratory failure,GERD,pulmonary hypertension,PTSD, dysthmia who present to ED BIB EMS with complaint of chest pain after eating spaghetti dinner. IN the ER, he was noted to have hypercapnic respiratory distress with ph 7.17 / co2 >123.   CT chest 1. Negative for pulmonary embolus. 2. Stable chronic lung disease and postoperative appearance of the chest: - left pneumonectomy with large chronic rim calcified fluid collection in the left hemithorax with chronic mass effect on the mediastinum - right lung Emphysema (ICD10-J43.9). 3.  Aortic Atherosclerosis (ICD10-I70.0).  CXR No acute abnormality.  Left pneumonectomy.  Acute on Chronic hypercapnic respiratory failure due to medication side-effect in setting of poor reserve/severe COPD Pulm HTN Acute hypoxic respiratory failure nos   -continue  on bipap , wean per protocol  - patient also has improved mentation  --repeat ABG showed decreased Co2 --pt is chronic co2 retainer -CTPE notes no infection / NAD --pr on chronic 3 liter oxygen --[facility] and [place] [facility]   Concern for dysphagia --chronic --per speech therapy-- old records to determine patient has tortuous esophagus which can lead to symptoms of reflux. -- Continue PPI -- will have patient follow-up G.I. as outpatient   Chest pain , atypical  - preliminary ischemic work up negative  - presumed due to know history of GERD - will continue with protonix  iv, add Carafate as able     Lung cancer s/p  left pneumonectomy   CHFpef  -well compensated  -resume lasix  ,zestroretic    DMII -diet controlled  --pt encouraged to eat meals and ordered ensure   HLD -continue zocor     HTN --resume home meds    PTSD Dysthmia -no acute decompensation   Generalized weakness with debility -- patient yet to work with PT OT. He declined therapy today  Patient's mentation back to baseline. Transfer out of ICU.   Procedures: Family communication :dter [name] [name] Consults : CODE STATUS: Full DVT Prophylaxis :Heparin  Level of care: Med-Surg Status is: Inpatient Remains inpatient appropriate because: Respiratory failure. PT/OT to see    TOTAL TIME TAKING CARE OF THIS PATIENT: 40 minutes.  >50% time spent on counselling and coordination of care  Note: This dictation was prepared with Dragon dictation along with smaller phrase technology. Any transcriptional errors that result from this process are unintentional.  Melvinia Stager M.D    [facility]   CC: Primary care physician; Pcp, No

## 2024-05-28 NOTE — Progress Notes (Signed)
*  PRELIMINARY RESULTS* Echocardiogram 2D Echocardiogram has been performed.  [name] [name] 05/28/2024, 2:33 PM

## 2024-05-29 ENCOUNTER — Inpatient Hospital Stay

## 2024-05-29 DIAGNOSIS — I10 Essential (primary) hypertension: Secondary | ICD-10-CM | POA: Diagnosis not present

## 2024-05-29 DIAGNOSIS — J441 Chronic obstructive pulmonary disease with (acute) exacerbation: Secondary | ICD-10-CM | POA: Diagnosis not present

## 2024-05-29 DIAGNOSIS — J9622 Acute and chronic respiratory failure with hypercapnia: Secondary | ICD-10-CM | POA: Diagnosis not present

## 2024-05-29 LAB — MRSA NEXT GEN BY PCR, NASAL: MRSA by PCR Next Gen: NOT DETECTED

## 2024-05-29 LAB — GLUCOSE, CAPILLARY
Glucose-Capillary: 104 mg/dL — ABNORMAL HIGH (ref 70–99)
Glucose-Capillary: 105 mg/dL — ABNORMAL HIGH (ref 70–99)
Glucose-Capillary: 132 mg/dL — ABNORMAL HIGH (ref 70–99)
Glucose-Capillary: 110 mg/dL — ABNORMAL HIGH (ref 70–99)

## 2024-05-29 NOTE — Plan of Care (Signed)
   Problem: Education: Goal: Knowledge of General Education information will improve Description Including pain rating scale, medication(s)/side effects and non-pharmacologic comfort measures Outcome: Progressing

## 2024-05-29 NOTE — Progress Notes (Signed)
 [facility]  -  at [facility]   PATIENT NAME: [name] [name]    MR#:  [id number]  DATE OF BIRTH:  May 09, 1951  SUBJECTIVE:  no family at bedside. Overall improving. Weak deconditioned. Encouraged to work with PT No resp distress. Breathing at baseline   VITALS:  Blood pressure (!) 135/92, pulse 92, temperature 98.1 F (36.7 C), resp. rate 16, height 5' 9 (1.753 m), weight 53 kg, SpO2 100%.  PHYSICAL EXAMINATION:   GENERAL:  73 y.o.-year-old patient with no acute distress.  LUNGS: distant breath sounds bilaterally, no wheezing CARDIOVASCULAR: S1, S2 normal. No murmur   ABDOMEN: Soft, nontender, nondistended. Bowel sounds present.  EXTREMITIES: No  edema b/l.    NEUROLOGIC: nonfocal  patient is alert and awake SKIN: per RN  LABORATORY PANEL:  CBC Recent Labs  Lab 05/28/24 0323  WBC 4.8  HGB 9.1*  HCT 30.6*  PLT 133*    Chemistries  Recent Labs  Lab 05/27/24 1800 05/28/24 0323  NA 140 138  K 4.7 4.5  CL 87* 88*  CO2 43* 40*  GLUCOSE 76 94  BUN 21 22  CREATININE 0.73 0.83  CALCIUM 9.2 9.3  MG 2.2  --   AST 25 25  ALT 22 22  ALKPHOS 56 55  BILITOT 0.6 0.9   Cardiac Enzymes No results for input(s): TROPONINI in the last 168 hours. RADIOLOGY:  ECHOCARDIOGRAM COMPLETE Result Date: 05/28/2024    ECHOCARDIOGRAM REPORT   Patient Name:   [name] [name] Date of Exam: 05/28/2024 Medical Rec #:  [id number]    Height:       69.0 in Accession #:    [id number]   Weight:       116.8 lb Date of Birth:  [birth year]   BSA:          1.644 m Patient Age:    72 years     BP:           142/92 mmHg Patient Gender: M            HR:           108 bpm. Exam Location:  [facility] Procedure: 2D Echo, Cardiac Doppler and Color Doppler (Both Spectral and Color            Flow Doppler were utilized during procedure). Indications:     Chest pain R07.9  History:         Patient has prior history of Echocardiogram examinations, most                  recent 08/20/2021. CHF; Risk  Factors:Diabetes and Hypertension.  Sonographer:     Broadus Canes Referring Phys:  [id number] SARA-MAIZ A THOMAS Diagnosing Phys: Sammy Crisp MD  Sonographer Comments: Technically challenging study due to limited acoustic windows, no parasternal window and no apical window. Image acquisition challenging due to patient body habitus. IMPRESSIONS  1. Left ventricular ejection fraction, by estimation, is 60 to 65%. The left ventricle has normal function. Left ventricular endocardial border not optimally defined to evaluate regional wall motion. Left ventricular diastolic function could not be evaluated.  2. Right ventricular systolic function is normal. The right ventricular size is normal.  3. Large pleural effusion in the left lateral region.  4. The mitral valve is normal in structure. Mild mitral valve regurgitation.  5. The aortic valve is tricuspid. There is moderate thickening of the aortic valve. Aortic valve regurgitation is not visualized. FINDINGS  Left Ventricle: Left ventricular ejection  fraction, by estimation, is 60 to 65%. The left ventricle has normal function. Left ventricular endocardial border not optimally defined to evaluate regional wall motion. The left ventricular internal cavity size was normal in size. Suboptimal image quality limits for assessment of left ventricular hypertrophy. Left ventricular diastolic function could not be evaluated. Right Ventricle: The right ventricular size is normal. Right vetricular wall thickness was not well visualized. Right ventricular systolic function is normal. Left Atrium: Left atrial size was normal in size. Right Atrium: Right atrial size was normal in size. Pericardium: There is no evidence of pericardial effusion. Mitral Valve: The mitral valve is normal in structure. Mild mitral valve regurgitation. Tricuspid Valve: The tricuspid valve is normal in structure. Tricuspid valve regurgitation is trivial. Aortic Valve: The aortic valve is tricuspid. There is  moderate thickening of the aortic valve. Aortic valve regurgitation is not visualized. Pulmonic Valve: The pulmonic valve was normal in structure. Pulmonic valve regurgitation is not visualized. No evidence of pulmonic stenosis. Aorta: The aortic root was not well visualized. Pulmonary Artery: The pulmonary artery is of normal size. IAS/Shunts: The interatrial septum was not well visualized. Additional Comments: There is a large pleural effusion in the left lateral region.  LEFT VENTRICLE PLAX 2D LVIDd:         4.30 cm LVIDs:         2.90 cm LV PW:         1.00 cm LV IVS:        0.60 cm  LEFT ATRIUM         Index LA diam:    3.80 cm 2.31 cm/m  TRICUSPID VALVE TR Peak grad:   11.8 mmHg TR Vmax:        172.00 cm/s Sammy Crisp MD Electronically signed by Sammy Crisp MD Signature Date/Time: 05/28/2024/4:45:16 PM    Final    DG Chest Port 1 View Result Date: 05/27/2024 CLINICAL DATA:  Hypoxemia EXAM: PORTABLE CHEST 1 VIEW COMPARISON:  Chest radiograph dated 05/27/2024 at 3:25 a.m. FINDINGS: Similar complete opacification of the left hemithorax demonstrating peripheral calcification with continued rightward deviation of the trachea and mediastinum status post left pneumonectomy. Right lung is well aerated. No focal consolidations. No right pleural effusion or pneumothorax. Left heart border is obscured. No acute osseous abnormality. IMPRESSION: 1. Similar postsurgical changes of left pneumonectomy with complete opacification of the left hemithorax. 2. No focal consolidation of the right lung. Electronically Signed   By: Limin  Xu M.D.   On: 05/27/2024 19:48    Assessment and Plan  [name] [name] is a 73 y.o. male with medical history significant of  Lung cancer s/p  left pneumonectomy, CHF , DMII, HLD, HTN , COPD with chronic hypoxic/hypercapnic respiratory failure,GERD,pulmonary hypertension,PTSD, dysthmia who present to ED BIB EMS with complaint of chest pain after eating spaghetti dinner. IN the ER, he  was noted to have hypercapnic respiratory distress with ph 7.17 / co2 >123.   CT chest 1. Negative for pulmonary embolus. 2. Stable chronic lung disease and postoperative appearance of the chest: - left pneumonectomy with large chronic rim calcified fluid collection in the left hemithorax with chronic mass effect on the mediastinum - right lung Emphysema (ICD10-J43.9). 3.  Aortic Atherosclerosis (ICD10-I70.0).  CXR No acute abnormality.  Left pneumonectomy.  Acute on Chronic hypercapnic respiratory failure due to medication side-effect in setting of poor reserve/severe COPD Pulm HTN Acute hypoxic respiratory failure nos  -continue on bipap , wean per protocol  - patient also has improved  mentation  --repeat ABG showed decreased Co2 --pt is chronic co2 retainer -CTPE notes no infection / NAD --pt on chronic 3 liter oxygen --[facility] [place] [facility]   Concern for dysphagia --chronic --per speech therapy-- old records to determine patient has tortuous esophagus which can lead to symptoms of reflux. -- Continue PPI -- will have patient follow-up G[facility]   Chest pain , atypical  - preliminary ischemic work up negative  - presumed due to know history of GERD - will continue with protonix  iv, add Carafate as able     Lung cancer s/p  left pneumonectomy   CHFpef  -well compensated  -resume lasix  ,zestroretic    DMII -diet controlled  --pt encouraged to eat meals and ordered ensure   HLD -continue zocor     HTN --resume home meds    PTSD Dysthmia -no acute decompensation   Generalized weakness with debility -- patient yet to work with PT OT. He declined therapy yday  Patient's mentation back to baseline.    Procedures: Family communication :dter tammy 6/11 Consults : CODE STATUS: Full DVT Prophylaxis :Heparin  Level of care: Med-Surg Status is: Inpatient Remains inpatient appropriate because: PT/OT to see    TOTAL TIME  TAKING CARE OF THIS PATIENT: 35 minutes.  >50% time spent on counselling and coordination of care  Note: This dictation was prepared with Dragon dictation along with smaller phrase technology. Any transcriptional errors that result from this process are unintentional.  Melvinia Stager M.D    [facility]   CC: Primary care physician; Pcp, No

## 2024-05-29 NOTE — Progress Notes (Signed)
 Patient states he has trouble swallowing. When the food goes down it burns when it drops into his stomach. MD notified at this time. Will continue monitor.

## 2024-05-29 NOTE — Evaluation (Addendum)
 Physical Therapy Evaluation Patient Details Name: [name] [name] MRN: [id number] DOB: [birth year] Today's Date: 05/29/2024  History of Present Illness  Pt is a 73 y.o. male with history of lung cancer status post partial pneumonectomy in 2005, GERD, hypertension, hyperlipidemia, diabetes, CHF, emphysema, pulmonary hypertension on 3 L of oxygen chronically who presented to the emergency department complaints of central chest pain that feels like burning. MD diagnoses includes atypical chest pain, hypercapnic respiratory failure, dysphagia, COPD, CHF, HLD, and HTN.  Clinical Impression  Pt was pleasant and became motivated to participate during the session after words of encouragement from PT and put forth fair effort throughout. Pt required no physical assistance to come to sitting EOB from supine and did not use bedrails for assist. Pt completed STS from EOB with RW and CGA. Pt was slightly unsteady completing STS, but was able to self arrest to prevent LOB. Pt ambulated 30 feet x 1 with RW and CGA and 90 feet with no AD and CGA. Pt remained steady and no LOB occurred during ambulation without AD, despite demonstrating lack of dorsiflexion bilaterally and minimal knee flexion bilaterally. Pt vitals remained stable (mid to high 90s) throughout gait on baseline 3L O2. Pt reported fatigue after ambulation and declined any further gait activities. Pt will benefit from continued PT services upon discharge to safely address deficits listed in patient problem list for decreased caregiver assistance and eventual return to PLOF.        If plan is discharge home, recommend the following: A little help with walking and/or transfers;Assist for transportation;A little help with bathing/dressing/bathroom;Help with stairs or ramp for entrance   Can travel by private vehicle        Equipment Recommendations None recommended by PT  Recommendations for Other Services       Functional Status Assessment Patient has  had a recent decline in their functional status and demonstrates the ability to make significant improvements in function in a reasonable and predictable amount of time.     Precautions / Restrictions Precautions Precautions: None Restrictions Weight Bearing Restrictions Per Provider Order: No      Mobility  Bed Mobility Overal bed mobility: Independent             General bed mobility comments: pt able to to come to sitting EOB from supine without physical assistance or using bedrails    Transfers Overall transfer level: Needs assistance Equipment used: Rolling walker (2 wheels) Transfers: Sit to/from Stand Sit to Stand: Contact guard assist           General transfer comment: pt slightly unsteady coming to standing from EOB with RW    Ambulation/Gait Ambulation/Gait assistance: Contact guard assist Gait Distance (Feet): 120 Feet Assistive device:  (30 feet x 1 with RW, 90 feet with no AD) Gait Pattern/deviations: Step-to pattern, Decreased dorsiflexion - right, Decreased dorsiflexion - left, Knee hyperextension - right, Knee hyperextension - left Gait velocity: decreased     General Gait Details: no LOB occurred during ambulation without AD, decreased dorsiflexion bilaterally, knee hyperextension bilaterally, but remained steady throughout. Did not wish to go in hallway, but made multiple loops in room with no LOB in turning.   Stairs            Wheelchair Mobility     Tilt Bed    Modified Rankin (Stroke Patients Only)       Balance Overall balance assessment: No apparent balance deficits (not formally assessed)  Pertinent Vitals/Pain Pain Assessment Pain Assessment: 0-10 Pain Score: 6  Pain Location: Chest Pain Descriptors / Indicators: Burning, Constant Pain Intervention(s): Monitored during session    Home Living Family/patient expects to be discharged to:: Private  residence Living Arrangements: Alone Available Help at Discharge: Family;Available PRN/intermittently Type of Home: House Home Access: Stairs to enter Entrance Stairs-Rails: Right Entrance Stairs-Number of Steps: 3   Home Layout: One level Home Equipment: Cane - single point      Prior Function Prior Level of Function : Independent/Modified Independent             Mobility Comments: able to ambulate household distances with [facility] ADLs Comments: able to complete ADLs with [facility]     Extremity/Trunk Assessment   Upper Extremity Assessment Upper Extremity Assessment: Overall WFL for tasks assessed    Lower Extremity Assessment Lower Extremity Assessment: Generalized weakness       Communication   Communication Communication: No apparent difficulties    Cognition Arousal: Alert Behavior During Therapy: Restless, Flat affect   PT - Cognitive impairments: No apparent impairments                         Following commands: Intact       Cueing Cueing Techniques: Verbal cues     General Comments      Exercises     Assessment/Plan    PT Assessment Patient needs continued PT services  PT Problem List Decreased range of motion;Decreased activity tolerance;Decreased safety awareness       PT Treatment Interventions      PT Goals (Current goals can be found in the Care Plan section)  Acute Rehab PT Goals Patient Stated Goal: to move around better on my own PT Goal Formulation: With patient Time For Goal Achievement: 06/11/24 Potential to Achieve Goals: Good    Frequency Min 1X/week     Co-evaluation               AM-PAC PT 6 Clicks Mobility  Outcome Measure Help needed turning from your back to your side while in a flat bed without using bedrails?: None Help needed moving from lying on your back to sitting on the side of a flat bed without using bedrails?: None Help needed moving to and from a bed to a chair (including a  wheelchair)?: None Help needed standing up from a chair using your arms (e.g., wheelchair or bedside chair)?: None Help needed to walk in hospital room?: A Little Help needed climbing 3-5 steps with a railing? : A Little 6 Click Score: 22    End of Session Equipment Utilized During Treatment: Gait belt Activity Tolerance: Patient tolerated treatment well Patient left: in bed;with call bell/phone within reach;with bed alarm set Nurse Communication: Mobility status PT Visit Diagnosis: Other abnormalities of gait and mobility (R26.89);Muscle weakness (generalized) (M62.81)    Time: 1100-1140 PT Time Calculation (min) (ACUTE ONLY): 40 min   Charges:                 Rhoda Centers, SPT 05/29/24, 1:29 PM This entire session was performed under direct supervision and direction of a licensed therapist/therapist assistant. I have personally read, edited and approve of the note as written.  Lonn Roads, DPT

## 2024-05-29 NOTE — Progress Notes (Signed)
 [facility]  - [place] at [facility]   PATIENT NAME: [name] [name]    MR#:  [id number]  DATE OF BIRTH:  1951-07-25  SUBJECTIVE:  no family at bedside. Overall improving. Weak deconditioned. Encouraged to work with PT No resp distress. Breathing at baseline   VITALS:  Blood pressure (!) 135/92, pulse 92, temperature 98.1 F (36.7 C), resp. rate 16, height 5' 9 (1.753 m), weight 53 kg, SpO2 100%.  PHYSICAL EXAMINATION:   GENERAL:  73 y.o.-year-old patient with no acute distress.  LUNGS: distant breath sounds bilaterally, no wheezing CARDIOVASCULAR: S1, S2 normal. No murmur   ABDOMEN: Soft, nontender, nondistended. Bowel sounds present.  EXTREMITIES: No  edema b/l.    NEUROLOGIC: nonfocal  patient is alert and awake SKIN: per RN  LABORATORY PANEL:  CBC Recent Labs  Lab 05/28/24 0323  WBC 4.8  HGB 9.1*  HCT 30.6*  PLT 133*    Chemistries  Recent Labs  Lab 05/27/24 1800 05/28/24 0323  NA 140 138  K 4.7 4.5  CL 87* 88*  CO2 43* 40*  GLUCOSE 76 94  BUN 21 22  CREATININE 0.73 0.83  CALCIUM 9.2 9.3  MG 2.2  --   AST 25 25  ALT 22 22  ALKPHOS 56 55  BILITOT 0.6 0.9   Cardiac Enzymes No results for input(s): TROPONINI in the last 168 hours. RADIOLOGY:  ECHOCARDIOGRAM COMPLETE Result Date: 05/28/2024    ECHOCARDIOGRAM REPORT   Patient Name:   [name] [name] Date of Exam: 05/28/2024 Medical Rec #:  [id number]    Height:       69.0 in Accession #:    [id number]   Weight:       116.8 lb Date of Birth:  08-04-1951   BSA:          1.644 m Patient Age:    72 years     BP:           142/92 mmHg Patient Gender: M            HR:           108 bpm. Exam Location:  [facility] Procedure: 2D Echo, Cardiac Doppler and Color Doppler (Both Spectral and Color            Flow Doppler were utilized during procedure). Indications:     Chest pain R07.9  History:         Patient has prior history of Echocardiogram examinations, most                  recent 08/20/2021. CHF; Risk  Factors:Diabetes and Hypertension.  Sonographer:     Broadus Canes Referring Phys:  [id number] SARA-MAIZ A THOMAS Diagnosing Phys: Sammy Crisp MD  Sonographer Comments: Technically challenging study due to limited acoustic windows, no parasternal window and no apical window. Image acquisition challenging due to patient body habitus. IMPRESSIONS  1. Left ventricular ejection fraction, by estimation, is 60 to 65%. The left ventricle has normal function. Left ventricular endocardial border not optimally defined to evaluate regional wall motion. Left ventricular diastolic function could not be evaluated.  2. Right ventricular systolic function is normal. The right ventricular size is normal.  3. Large pleural effusion in the left lateral region.  4. The mitral valve is normal in structure. Mild mitral valve regurgitation.  5. The aortic valve is tricuspid. There is moderate thickening of the aortic valve. Aortic valve regurgitation is not visualized. FINDINGS  Left Ventricle: Left ventricular ejection  fraction, by estimation, is 60 to 65%. The left ventricle has normal function. Left ventricular endocardial border not optimally defined to evaluate regional wall motion. The left ventricular internal cavity size was normal in size. Suboptimal image quality limits for assessment of left ventricular hypertrophy. Left ventricular diastolic function could not be evaluated. Right Ventricle: The right ventricular size is normal. Right vetricular wall thickness was not well visualized. Right ventricular systolic function is normal. Left Atrium: Left atrial size was normal in size. Right Atrium: Right atrial size was normal in size. Pericardium: There is no evidence of pericardial effusion. Mitral Valve: The mitral valve is normal in structure. Mild mitral valve regurgitation. Tricuspid Valve: The tricuspid valve is normal in structure. Tricuspid valve regurgitation is trivial. Aortic Valve: The aortic valve is tricuspid. There is  moderate thickening of the aortic valve. Aortic valve regurgitation is not visualized. Pulmonic Valve: The pulmonic valve was normal in structure. Pulmonic valve regurgitation is not visualized. No evidence of pulmonic stenosis. Aorta: The aortic root was not well visualized. Pulmonary Artery: The pulmonary artery is of normal size. IAS/Shunts: The interatrial septum was not well visualized. Additional Comments: There is a large pleural effusion in the left lateral region.  LEFT VENTRICLE PLAX 2D LVIDd:         4.30 cm LVIDs:         2.90 cm LV PW:         1.00 cm LV IVS:        0.60 cm  LEFT ATRIUM         Index LA diam:    3.80 cm 2.31 cm/m  TRICUSPID VALVE TR Peak grad:   11.8 mmHg TR Vmax:        172.00 cm/s Sammy Crisp MD Electronically signed by Sammy Crisp MD Signature Date/Time: 05/28/2024/4:45:16 PM    Final    DG Chest Port 1 View Result Date: 05/27/2024 CLINICAL DATA:  Hypoxemia EXAM: PORTABLE CHEST 1 VIEW COMPARISON:  Chest radiograph dated 05/27/2024 at 3:25 a.m. FINDINGS: Similar complete opacification of the left hemithorax demonstrating peripheral calcification with continued rightward deviation of the trachea and mediastinum status post left pneumonectomy. Right lung is well aerated. No focal consolidations. No right pleural effusion or pneumothorax. Left heart border is obscured. No acute osseous abnormality. IMPRESSION: 1. Similar postsurgical changes of left pneumonectomy with complete opacification of the left hemithorax. 2. No focal consolidation of the right lung. Electronically Signed   By: Limin  Xu M.D.   On: 05/27/2024 19:48    Assessment and Plan  [name] [name] is a 73 y.o. male with medical history significant of  Lung cancer s/p  left pneumonectomy, CHF , DMII, HLD, HTN , COPD with chronic hypoxic/hypercapnic respiratory failure,GERD,pulmonary hypertension,PTSD, dysthmia who present to ED BIB EMS with complaint of chest pain after eating spaghetti dinner. IN the ER, he  was noted to have hypercapnic respiratory distress with ph 7.17 / co2 >123.   CT chest 1. Negative for pulmonary embolus. 2. Stable chronic lung disease and postoperative appearance of the chest: - left pneumonectomy with large chronic rim calcified fluid collection in the left hemithorax with chronic mass effect on the mediastinum - right lung Emphysema (ICD10-J43.9). 3.  Aortic Atherosclerosis (ICD10-I70.0).  CXR No acute abnormality.  Left pneumonectomy.  Acute on Chronic hypercapnic respiratory failure due to medication side-effect in setting of poor reserve/severe COPD Pulm HTN Acute hypoxic respiratory failure nos  -continue on bipap , wean per protocol  - patient also has improved  mentation  --repeat ABG showed decreased Co2 --pt is chronic co2 retainer -CTPE notes no infection / NAD --pt on chronic 3 liter oxygen --[facility] and [place] [facility]   Concern for dysphagia --chronic --per speech therapy-- old records to determine patient has tortuous esophagus which can lead to symptoms of reflux. -- Continue PPI --will do Barium Swallow tomorrow -- will have patient follow-[facility] G[facility] as outpatient at [facility] pending   Chest pain , atypical  - preliminary ischemic work up negative  - presumed due to know history of GERD - will continue with protonix  iv, add Carafate as able     Lung cancer s/p  left pneumonectomy   CHFpef  -well compensated  -resume lasix  ,zestroretic    DMII -diet controlled  --pt encouraged to eat meals and ordered ensure   HLD -continue zocor     HTN --resume home meds    PTSD Dysthmia -no acute decompensation   Generalized weakness with debility -- patient yet to work with PT OT. He declined therapy yday  Patient's mentation back to baseline.    Procedures: Family communication :dter tammy 6/11 Consults : CODE STATUS: Full DVT Prophylaxis :Heparin  Level of care: Med-Surg Status is: Inpatient Remains inpatient appropriate  because: PT/OT to see    TOTAL TIME TAKING CARE OF THIS PATIENT: 35 minutes.  >50% time spent on counselling and coordination of care  Note: This dictation was prepared with Dragon dictation along with smaller phrase technology. Any transcriptional errors that result from this process are unintentional.  Melvinia Stager M.D    [facility]   CC: Primary care physician; Pcp, No

## 2024-05-30 DIAGNOSIS — J9612 Chronic respiratory failure with hypercapnia: Secondary | ICD-10-CM

## 2024-05-30 DIAGNOSIS — J432 Centrilobular emphysema: Secondary | ICD-10-CM | POA: Diagnosis not present

## 2024-05-30 DIAGNOSIS — I10 Essential (primary) hypertension: Secondary | ICD-10-CM | POA: Diagnosis not present

## 2024-05-30 DIAGNOSIS — J9611 Chronic respiratory failure with hypoxia: Secondary | ICD-10-CM

## 2024-05-30 DIAGNOSIS — K219 Gastro-esophageal reflux disease without esophagitis: Secondary | ICD-10-CM

## 2024-05-30 LAB — GLUCOSE, CAPILLARY
Glucose-Capillary: 91 mg/dL (ref 70–99)
Glucose-Capillary: 169 mg/dL — ABNORMAL HIGH (ref 70–99)
Glucose-Capillary: 96 mg/dL (ref 70–99)

## 2024-05-30 MED ORDER — OMEPRAZOLE 20 MG PO CPDR: 20.0000 mg | DELAYED_RELEASE_CAPSULE | Freq: Two times a day (BID) | ORAL | 1 refills | Status: DC

## 2024-05-30 MED ORDER — SENNOSIDES-DOCUSATE SODIUM 8.6-50 MG PO TABS
1.0000 | ORAL_TABLET | Freq: Two times a day (BID) | ORAL | Status: DC
  Administered 2024-05-30 – 2024-05-31 (×2): 1 via ORAL
  Filled 2024-05-30 (×2): qty 1

## 2024-05-30 MED ORDER — ENSURE PLUS HIGH PROTEIN PO LIQD: 237.0000 mL | Freq: Three times a day (TID) | ORAL | 0 refills | Status: DC

## 2024-05-30 NOTE — Plan of Care (Signed)
  Problem: Clinical Measurements: Goal: Ability to maintain clinical measurements within normal limits will improve Outcome: Progressing   Problem: Activity: Goal: Risk for activity intolerance will decrease Outcome: Progressing   Problem: Safety: Goal: Ability to remain free from injury will improve Outcome: Progressing   Problem: Pain Managment: Goal: General experience of comfort will improve and/or be controlled Outcome: Progressing

## 2024-05-30 NOTE — TOC Transition Note (Addendum)
 Transition of Care [facility]) - Discharge Note   Patient Details  Name: [name] [name] MRN: [id number] Date of Birth: 11-14-1951  Transition of Care [facility]) CM/SW Contact:  Elsie Halo, RN Phone Number: 05/30/2024, 11:59 AM   Clinical Narrative:      Patient is medically clear to discharge to home with home health. Referral for [facility] PT called to [facility], Shaun excepted the referral and advised it could take up to a week for the [place] to approve. TOC outreached to the patient daughter via phone and left a message requesting a callback. No other TOC needs identified.   2:45 PM TOC received callback from the patient's daughter. She is unable to pick the patient up today. She is out of town. TOC spoke with the patient in his room and the patient has no keys to his home and no way to get his home O2 for transport. TOC made MD and RN aware.     TOC received   Patient Goals and CMS Choice            Discharge Placement                       Discharge Plan and Services Additional resources added to the After Visit Summary for                                       Social Drivers of Health (SDOH) Interventions SDOH Screenings   Food Insecurity: Food Insecurity Present (05/28/2024)  Housing: Low Risk  (05/28/2024)  Transportation Needs: Unmet Transportation Needs (05/28/2024)  Utilities: At Risk (05/28/2024)  Depression (PHQ2-9): Low Risk  (08/22/2022)  Social Connections: Moderately Isolated (05/28/2024)  Tobacco Use: Medium Risk (05/27/2024)     Readmission Risk Interventions     No data to display

## 2024-05-30 NOTE — Care Management Important Message (Signed)
 Important Message  Patient Details  Name: [name] [name] MRN: [id number] Date of Birth: 06-13-1951   Important Message Given:  Yes - Medicare IM     [name], CMA 05/30/2024, 1:34 PM

## 2024-05-30 NOTE — Progress Notes (Signed)
 Patient states he has no ride home today and that his daughter and granddaughter are at work today and have the keys to his home. Case manager and MD made aware. Daughter states she will be able to drive patient home in the morning.

## 2024-05-30 NOTE — Progress Notes (Signed)
 One SVT burst HR 166 on tele monitoring. MD made aware at this time. No further orders implemented. Will continue to monitor.

## 2024-05-30 NOTE — Discharge Summary (Signed)
 Physician Discharge Summary   Patient: [name] [name] MRN: [id number] DOB: Jul 09, 1951  Admit date:     05/27/2024  Discharge date: 05/30/24  Discharge Physician: Melvinia Stager   [facility]   Recommendations at discharge:    Patient to [facility] with [facility], pulmonologist and G.I. . Use your oxygen, inhalers as before use your CPAP daily  Discharge Diagnoses: Principal Problem:   Respiratory failure with hypercapnia (HCC) Active Problems:   COPD with acute exacerbation (HCC)   Primary hypertension   Atypical chest pain   [name] [name] is a 73 y.o. male with medical history significant of  Lung cancer s/p  left pneumonectomy, CHF , DMII, HLD, HTN , COPD with chronic hypoxic/hypercapnic respiratory failure,GERD,pulmonary hypertension,PTSD, dysthmia who present to ED BIB EMS with complaint of chest pain after eating spaghetti dinner. IN the ER, he was noted to have hypercapnic respiratory distress with ph 7.17 / co2 >123.    CT chest 1. Negative for pulmonary embolus. 2. Stable chronic lung disease and postoperative appearance of the chest: - left pneumonectomy with large chronic rim calcified fluid collection in the left hemithorax with chronic mass effect on the mediastinum - right lung Emphysema (ICD10-J43.9). 3.  Aortic Atherosclerosis (ICD10-I70.0).   CXR No acute abnormality.  Left pneumonectomy.   Acute on Chronic hypercapnic respiratory failure due to medication side-effect in setting of poor reserve/severe COPD Pulm HTN Acute hypoxic respiratory failure nos  -continue on bipap , wean per protocol  - patient also has improved mentation  --repeat ABG showed decreased Co2 since admission --pt is chronic co2 retainer -CTPE notes no infection / NAD --pt on chronic 3 liter oxygen --pt follows with [facility]m and [place] [facility]m   Concern for dysphagia --chronic --per speech therapy-- old records to determine patient has tortuous esophagus which can lead  to symptoms of reflux. -- Continue PPI -- Barium Swallow --Moderately tortuous esophagus, unchanged from prior esophagram. No stricture is seen. Daughter Benn Brash has been updated with results -- will have patient follow-up G.I. as outpatient at [facility]    Chest pain , atypical  - preliminary ischemic work up negative  - presumed due to know history of GERD - cont ppi    Lung cancer s/p  left pneumonectomy   CHFpef  -well compensated     DMII -diet controlled  --pt encouraged to eat meals and ordered ensure   HLD -continue zocor     HTN --resume home meds     PTSD Dysthmia -no acute decompensation    Generalized weakness with debility -- home health will be arranged   patient overall is at baseline. Will discharge to home with chronic oxygen. Discharge plan was discussed with patient's daughter Benn Brash on the phone    Family communication :dter tammy 6/13 Consults :none CODE STATUS: Full DVT Prophylaxis :Heparin        Disposition: Home health Diet recommendation:  Discharge Diet Orders (From admission, onward)     Start     Ordered   05/30/24 0000  Diet - low sodium heart healthy        05/30/24 1135           Cardiac and Carb modified diet DISCHARGE MEDICATION: Allergies as of 05/30/2024       Reactions   Morphine  Other (See Comments)   Very sensitive d/t resp changes that cause hypercarbia        Medication List     TAKE these medications    acetaminophen  325  MG tablet Commonly known as: TYLENOL  Take 650 mg by mouth every 6 (six) hours as needed for headache or mild pain.   albuterol  108 (90 Base) MCG/ACT inhaler Commonly known as: VENTOLIN  HFA Inhale 1-2 puffs into the lungs every 6 (six) hours as needed.   arformoterol  15 MCG/2ML Nebu Commonly known as: BROVANA  Inhale 15 mcg into the lungs 2 (two) times daily.   budesonide  0.5 MG/2ML nebulizer solution Commonly known as: PULMICORT  Inhale 0.5 mg into the lungs daily.    carboxymethylcellulose 1 % ophthalmic solution Apply 1 drop to eye as needed. For dry eyes   carvedilol  3.125 MG tablet Commonly known as: COREG  Take 1 tablet (3.125 mg total) by mouth 2 (two) times daily.   Cholecalciferol  10 MCG (400 UNIT) Caps Take 400 Units by mouth daily.   Cialis 10 MG tablet Generic drug: tadalafil Take 10 mg by mouth daily as needed.   feeding supplement Liqd Take 237 mLs by mouth 3 (three) times daily between meals.   furosemide  40 MG tablet Commonly known as: LASIX  Take 1 tablet (40 mg total) by mouth daily for 7 days.   lisinopril  10 MG tablet Commonly known as: ZESTRIL  Take 5 mg by mouth daily.   loratadine 10 MG tablet Commonly known as: CLARITIN Take 10 mg by mouth daily. For allegies   multivitamin capsule Take 1 capsule by mouth daily.   Ohtuvayre  3 MG/2.5ML Susp Generic drug: Ensifentrine  Inhale 3 mg into the lungs 2 (two) times daily.   omeprazole 20 MG capsule Commonly known as: PRILOSEC Take 1 capsule (20 mg total) by mouth 2 (two) times daily before a meal. What changed: when to take this   revefenacin  175 MCG/3ML nebulizer solution Commonly known as: YUPELRI  Take 175 mcg by nebulization daily.   simvastatin  20 MG tablet Commonly known as: ZOCOR  Take 10 mg by mouth daily.        Discharge Exam: Filed Weights   05/27/24 0318  Weight: 53 kg  GENERAL:  73 y.o.-year-old patient with no acute distress.  LUNGS: distant breath sounds bilaterally, no wheezing CARDIOVASCULAR: S1, S2 normal. No murmur      NEUROLOGIC: nonfocal  patient is alert and awake SKIN: per RN   Condition at discharge: fair  The results of significant diagnostics from this hospitalization (including imaging, microbiology, ancillary and laboratory) are listed below for reference.   Imaging Studies: DG ESOPHAGUS W SINGLE CM (SOL OR THIN BA) Result Date: 05/29/2024 CLINICAL DATA:  73 year old male with a history of GERD, on medication, who  presented to the ED with complaints of chest pain after eating spaghetti. History of EGD in 2022 reporting tortuous esophagus. States that he feels like solid foods get stuck in his chest and once they pass he develops a burning pain in his epigastric area. History of left pneumonectomy and residual large calcified left pleural rind/collection. EXAM: ESOPHAGUS/BARIUM SWALLOW/TABLET STUDY TECHNIQUE: Single contrast examination was performed using thin liquid barium. This exam was performed by [facility] PA-C, and was supervised and interpreted by Dr. Bertina Broccoli. FLUOROSCOPY: Radiation Exposure Index (as provided by the fluoroscopic device): 20.3 mGy Kerma COMPARISON:  DG ESOPHAGUS W SINGLE CM - 08/25/2021., CT chest 05/27/2024 FINDINGS: Swallowing: Appears normal. No vestibular penetration or aspiration seen. Pharynx: Unremarkable. Esophagus: The esophagus is again moderately tortuous. There is again an anterior impression on the anterior aspect of the upper esophagus secondary to markedly tortuous thoracic aorta is seen on prior CT 05/27/2024 and prior esophagram 08/25/2021. No definite stricture  or narrowing. Esophageal motility: Within normal limits. Hiatal Hernia: None visualized. Gastroesophageal reflux: None visualized with coughing. Ingested 13mm barium tablet: Passed without difficulty into the stomach. Other: Evaluation performed at 55 degree table tilt due to limited patient mobility. IMPRESSION: Moderately tortuous esophagus, unchanged from prior esophagram. No stricture is seen. Electronically Signed   By: Bertina Broccoli M.D.   On: 05/29/2024 16:37   ECHOCARDIOGRAM COMPLETE Result Date: 05/28/2024    ECHOCARDIOGRAM REPORT   Patient Name:   Reegan Regnier Date of Exam: 05/28/2024 Medical Rec #:  [id number]    Height:       69.0 in Accession #:    [id number]   Weight:       116.8 lb Date of Birth:  1951-01-06   BSA:          1.644 m Patient Age:    72 years     BP:           142/92 mmHg Patient Gender:  M            HR:           108 bpm. Exam Location:  [facility] Procedure: 2D Echo, Cardiac Doppler and Color Doppler (Both Spectral and Color            Flow Doppler were utilized during procedure). Indications:     Chest pain R07.9  History:         Patient has prior history of Echocardiogram examinations, most                  recent 08/20/2021. CHF; Risk Factors:Diabetes and Hypertension.  Sonographer:     Broadus Canes Referring Phys:  [id number] SARA-MAIZ A THOMAS Diagnosing Phys: Sammy Crisp MD  Sonographer Comments: Technically challenging study due to limited acoustic windows, no parasternal window and no apical window. Image acquisition challenging due to patient body habitus. IMPRESSIONS  1. Left ventricular ejection fraction, by estimation, is 60 to 65%. The left ventricle has normal function. Left ventricular endocardial border not optimally defined to evaluate regional wall motion. Left ventricular diastolic function could not be evaluated.  2. Right ventricular systolic function is normal. The right ventricular size is normal.  3. Large pleural effusion in the left lateral region.  4. The mitral valve is normal in structure. Mild mitral valve regurgitation.  5. The aortic valve is tricuspid. There is moderate thickening of the aortic valve. Aortic valve regurgitation is not visualized. FINDINGS  Left Ventricle: Left ventricular ejection fraction, by estimation, is 60 to 65%. The left ventricle has normal function. Left ventricular endocardial border not optimally defined to evaluate regional wall motion. The left ventricular internal cavity size was normal in size. Suboptimal image quality limits for assessment of left ventricular hypertrophy. Left ventricular diastolic function could not be evaluated. Right Ventricle: The right ventricular size is normal. Right vetricular wall thickness was not well visualized. Right ventricular systolic function is normal. Left Atrium: Left atrial size was normal in size.  Right Atrium: Right atrial size was normal in size. Pericardium: There is no evidence of pericardial effusion. Mitral Valve: The mitral valve is normal in structure. Mild mitral valve regurgitation. Tricuspid Valve: The tricuspid valve is normal in structure. Tricuspid valve regurgitation is trivial. Aortic Valve: The aortic valve is tricuspid. There is moderate thickening of the aortic valve. Aortic valve regurgitation is not visualized. Pulmonic Valve: The pulmonic valve was normal in structure. Pulmonic valve regurgitation is not visualized. No evidence of pulmonic stenosis. Aorta: The  aortic root was not well visualized. Pulmonary Artery: The pulmonary artery is of normal size. IAS/Shunts: The interatrial septum was not well visualized. Additional Comments: There is a large pleural effusion in the left lateral region.  LEFT VENTRICLE PLAX 2D LVIDd:         4.30 cm LVIDs:         2.90 cm LV PW:         1.00 cm LV IVS:        0.60 cm  LEFT ATRIUM         Index LA diam:    3.80 cm 2.31 cm/m  TRICUSPID VALVE TR Peak grad:   11.8 mmHg TR Vmax:        172.00 cm/s Sammy Crisp MD Electronically signed by Sammy Crisp MD Signature Date/Time: 05/28/2024/4:45:16 PM    Final    DG Chest Port 1 View Result Date: 05/27/2024 CLINICAL DATA:  Hypoxemia EXAM: PORTABLE CHEST 1 VIEW COMPARISON:  Chest radiograph dated 05/27/2024 at 3:25 a.m. FINDINGS: Similar complete opacification of the left hemithorax demonstrating peripheral calcification with continued rightward deviation of the trachea and mediastinum status post left pneumonectomy. Right lung is well aerated. No focal consolidations. No right pleural effusion or pneumothorax. Left heart border is obscured. No acute osseous abnormality. IMPRESSION: 1. Similar postsurgical changes of left pneumonectomy with complete opacification of the left hemithorax. 2. No focal consolidation of the right lung. Electronically Signed   By: Limin  Xu M.D.   On: 05/27/2024 19:48    CT HEAD WO CONTRAST ( ) Result Date: 05/27/2024 CLINICAL DATA:  Altered mental status. EXAM: CT HEAD WITHOUT CONTRAST TECHNIQUE: Contiguous axial images were obtained from the base of the skull through the vertex without intravenous contrast. RADIATION DOSE REDUCTION: This exam was performed according to the departmental dose-optimization program which includes automated exposure control, adjustment of the mA and/or kV according to patient size and/or use of iterative reconstruction technique. COMPARISON:  None Available. FINDINGS: Brain: No evidence of intracranial hemorrhage, acute infarction, hydrocephalus, extra-axial collection, or mass lesion/mass effect. Vascular:  No hyperdense vessel or other acute findings. Skull: No evidence of fracture or other significant bone abnormality. Sinuses/Orbits:  No acute findings. Other: None. IMPRESSION: Negative noncontrast head CT. Electronically Signed   By: Marlyce Sine M.D.   On: 05/27/2024 07:47   CT Angio Chest PE W and/or Wo Contrast Result Date: 05/27/2024 CLINICAL DATA:  73 year old male with chest pain and shortness of breath. History of a left pneumonectomy with residual large calcified left pleural rind/collection. EXAM: CT ANGIOGRAPHY CHEST WITH CONTRAST TECHNIQUE: Multidetector CT imaging of the chest was performed using the standard protocol during bolus administration of intravenous contrast. Multiplanar CT image reconstructions and MIPs were obtained to evaluate the vascular anatomy. RADIATION DOSE REDUCTION: This exam was performed according to the departmental dose-optimization program which includes automated exposure control, adjustment of the mA and/or kV according to patient size and/or use of iterative reconstruction technique. CONTRAST:  75mL OMNIPAQUE  IOHEXOL  350 MG/ML SOLN COMPARISON:  CTA chest 09/25/2014. Portable chest radiographs 0325 hours today. FINDINGS: Cardiovascular: Excellent contrast bolus timing in the pulmonary arterial  tree. 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 No pulmonary artery filling defect identified. Chronic intrathoracic mass effect from left lung findings (below) stable since 2015. Heart size remains within normal limits. No pericardial effusion. Stable visible aorta, negative aside from atherosclerosis and surrounding postoperative changes. Mediastinum/Nodes: Chronic mediastinal shift and postoperative changes with no significant change  since 2015. Lungs/Pleura: Chronic large and rim calcified oval fluid collection occupying the left hemithorax with chronic mass effect on the mediastinum appears unchanged from a 2022 CT with contrast and has low to intermediate density fluid content as before (coronal image 59). Left pneumonectomy changes superimposed, blind-ending left mainstem bronchus unchanged from prior exams. Trachea and right lung major airways remain patent. Centrilobular right lung emphysema. No right pleural effusion, mild dependent atelectasis now in the right costophrenic angle. But no right pleural effusion and otherwise no acute or suspicious right lung opacity. Upper Abdomen: Partially visible small but circumscribed low-density area in the posterior right hepatic lobe on series 5, image 200 is most compatible with benign cyst or hemangioma (no follow-up imaging recommended). Otherwise negative visible essentially noncontrast liver, gallbladder, spleen, pancreas, adrenal glands and kidneys. Upper abdominal stomach and bowel contents mixed with some oral contrast type material. No pneumoperitoneum or upper abdominal free fluid identified. Musculoskeletal: Stable visualized osseous structures. Review of the MIP images confirms the above findings. IMPRESSION: 1. Negative for pulmonary embolus. 2. Stable chronic lung disease and postoperative appearance of the chest: - left pneumonectomy with large chronic rim calcified fluid collection in the left  hemithorax with chronic mass effect on the mediastinum - right lung Emphysema (ICD10-J43.9). 3.  Aortic Atherosclerosis (ICD10-I70.0). Electronically Signed   By: Marlise Simpers M.D.   On: 05/27/2024 05:26   DG Chest Portable 1 View Result Date: 05/27/2024 CLINICAL DATA:  Chest pain and shortness of breath EXAM: PORTABLE CHEST 1 VIEW COMPARISON:  Radiograph 01/07/2024 FINDINGS: Left pneumonectomy with complete opacification of the left hemithorax. The right lung is clear. Stable cardiomediastinal silhouette. No displaced rib fractures. IMPRESSION: No acute abnormality.  Left pneumonectomy. Electronically Signed   By: Rozell Cornet M.D.   On: 05/27/2024 03:34    Microbiology: Results for orders placed or performed during the hospital encounter of 05/27/24  MRSA Next Gen by PCR, Nasal     Status: None   Collection Time: 05/29/24 10:08 AM   Specimen: Nasal Mucosa; Nasal Swab  Result Value Ref Range Status   MRSA by PCR Next Gen NOT DETECTED NOT DETECTED Final    Comment: (NOTE) The GeneXpert MRSA Assay (FDA approved for NASAL specimens only), is one component of a comprehensive MRSA colonization surveillance program. It is not intended to diagnose MRSA infection nor to guide or monitor treatment for MRSA infections. Test performance is not FDA approved in patients less than 9 years old. Performed at [facility] [address] [place], [place] [address]     Labs: CBC: Recent Labs  Lab 05/27/24 4696 05/27/24 1432 05/27/24 1800 05/28/24 0323  WBC 5.1 5.4 5.4 4.8  NEUTROABS 3.8  --   --   --   HGB 10.0* 10.0* 9.4* 9.1*  HCT 33.6* 33.6* 31.7* 30.6*  MCV 96.3 96.6 97.5 96.5  PLT 137* 142* 132* 133*   Basic Metabolic Panel: Recent Labs  Lab 05/27/24 0337 05/27/24 1432 05/27/24 1800 05/28/24 0323  NA 137  --  140 138  K 3.9  --  4.7 4.5  CL 85*  --  87* 88*  CO2 43*  --  43* 40*  GLUCOSE 142*  --  76 94  BUN 20  --  21 22  CREATININE 0.80 0.83 0.73 0.83  CALCIUM 9.4   --  9.2 9.3  MG 2.0  --  2.2  --    Liver Function Tests: Recent Labs  Lab 05/27/24 0337 05/27/24 1800 05/28/24  0323  AST 23 25 25   ALT 19 22 22   ALKPHOS 54 56 55  BILITOT 0.5 0.6 0.9  PROT 7.2 6.6 6.4*  ALBUMIN 4.0 3.6 3.5   CBG: Recent Labs  Lab 05/29/24 0758 05/29/24 1150 05/29/24 1627 05/29/24 2113 05/30/24 0727  GLUCAP 104* 105* 132* 110* 91    Discharge time spent: greater than 30 minutes.  Signed: Melvinia Stager, MD [facility] 05/30/2024

## 2024-05-31 DIAGNOSIS — K219 Gastro-esophageal reflux disease without esophagitis: Secondary | ICD-10-CM | POA: Diagnosis not present

## 2024-05-31 DIAGNOSIS — J9622 Acute and chronic respiratory failure with hypercapnia: Secondary | ICD-10-CM | POA: Diagnosis not present

## 2024-05-31 DIAGNOSIS — J441 Chronic obstructive pulmonary disease with (acute) exacerbation: Secondary | ICD-10-CM | POA: Diagnosis not present

## 2024-05-31 DIAGNOSIS — I10 Essential (primary) hypertension: Secondary | ICD-10-CM | POA: Diagnosis not present

## 2024-05-31 LAB — GLUCOSE, CAPILLARY
Glucose-Capillary: 88 mg/dL (ref 70–99)
Glucose-Capillary: 111 mg/dL — ABNORMAL HIGH (ref 70–99)

## 2024-05-31 NOTE — TOC Transition Note (Signed)
 Transition of Care [facility]) - Discharge Note   Patient Details  Name: [name] [name] MRN: [id number] Date of Birth: August 02, 1951  Transition [facility] Care [facility]) CM/SW Contact:  Crayton Docker, RN 05/31/2024, 12:40 PM   Clinical Narrative:     Discharge orders noted. CM call to patient's daughter, Benn Brash, phone: [phone]- regarding patient's portable oxygen and patient's home health PT. Per patient's daughter has patient's portable oxygen and is bringing portable oxygen with her to pick up patient. CM call to Naoma Bacca [facility], phone: [phone] regarding pending discharge. Per Shaun,verbalized understanding and agreement. Noted, ambulatory referral to Palliative Services. CM alert to Kara, [facility] regarding Ambulatory referral to Palliative Services. Per Deborrah Fam, will follow up.   Final next level of care: Home w Home Health Services Barriers to Discharge: No Barriers Identified   Patient Goals and CMS Choice    Home with home health PT   Discharge Placement     Home with home health PT            Discharge Plan and Services Additional resources added to the After Visit Summary for      [facility] Agency: [facility] Date [facility] Agency Contacted: 05/31/24 Time HH Agency Contacted: 1239 Representative spoke with at [facility] Agency: Shaun  Social Drivers of Health (SDOH) Interventions SDOH Screenings   Food Insecurity: Food Insecurity Present (05/28/2024)  Housing: Low Risk  (05/28/2024)  Transportation Needs: Unmet Transportation Needs (05/28/2024)  Utilities: At Risk (05/28/2024)  Depression (PHQ2-9): Low Risk  (08/22/2022)  Social Connections: Moderately Isolated (05/28/2024)  Tobacco Use: Medium Risk (05/27/2024)     Readmission Risk Interventions     No data to display

## 2024-05-31 NOTE — Plan of Care (Signed)

## 2024-05-31 NOTE — Progress Notes (Signed)
 [facility]) OutPatient Palliative Referral  Referral for outpatient palliative care received from [facility], TOC.  Patient with plans to discharge back home today with [facility] and Palliative to follow at home.  Referral sent to [facility] referral intake.  Thank you for allowing participation in this patient's care.  Ambrosio Junker, RN Nurse Liaison [phone]

## 2024-05-31 NOTE — Progress Notes (Signed)
 Patient discharged home at this time. Writer reviewed home care instructions with patient and has no further questions. Daughter is driving patient home and brought portable O2 tank. Patient transported to front lobby via wheelchair by staff.

## 2024-05-31 NOTE — Discharge Summary (Addendum)
 Physician Discharge Summary   Patient: [name] [name] MRN: [id number] DOB: Apr 28, 1951  Admit date:     05/27/2024  Discharge date: 05/31/24  Discharge Physician: Melvinia Stager   [facility]   Recommendations at discharge:    Patient to follow-up with [facility], pulmonologist and G.I. . Use your oxygen, inhalers as before use your CPAP daily  Discharge Diagnoses: Principal Problem:   Respiratory failure with hypercapnia (HCC) Active Problems:   COPD with acute exacerbation (HCC)   Primary hypertension   Atypical chest pain   [name] [name] is a 73 y.o. male with medical history significant of  Lung cancer s/p  left pneumonectomy, CHF , DMII, HLD, HTN , COPD with chronic hypoxic/hypercapnic respiratory failure,GERD,pulmonary hypertension,PTSD, dysthmia who present to ED BIB EMS with complaint of chest pain after eating spaghetti dinner. IN the ER, he was noted to have hypercapnic respiratory distress with ph 7.17 / co2 >123.    CT chest 1. Negative for pulmonary embolus. 2. Stable chronic lung disease and postoperative appearance of the chest: - left pneumonectomy with large chronic rim calcified fluid collection in the left hemithorax with chronic mass effect on the mediastinum - right lung Emphysema (ICD10-J43.9). 3.  Aortic Atherosclerosis (ICD10-I70.0).   CXR No acute abnormality.  Left pneumonectomy.   Acute on Chronic hypercapnic respiratory failure due to medication side-effect in setting of poor reserve/severe COPD Pulm HTN Acute hypoxic respiratory failure nos  -continue on bipap , wean per protocol  - patient also has improved mentation  --repeat ABG showed decreased Co2 since admission --pt is chronic co2 retainer -CTPE notes no infection / NAD --pt on chronic 3 liter oxygen --[facility]  Concern for dysphagia --chronic --per speech therapy-- old records to determine patient has tortuous esophagus which can lead  to symptoms of reflux. -- Continue PPI -- Barium Swallow --Moderately tortuous esophagus, unchanged from prior esophagram. No stricture is seen. Daughter [name] [name] has been updated with results -- will have patient follow-up G.I. as outpatient at [facility]    Chest pain , atypical  - preliminary ischemic work up negative  - presumed due to know history of GERD - cont ppi    Lung cancer s/p  left pneumonectomy  --pt has Chronic intrathoracic mass effect from left lung findings stable since 2015.   CHFpef  -well compensated  --not on diuretics    DMII -diet controlled  --pt encouraged to eat meals and ordered ensure   HLD -continue zocor     HTN --resume home meds  hold if bp <120   PTSD Dysthmia -no acute decompensation    Generalized weakness with debility -- home health will be arranged   patient overall is at baseline. Will discharge to home with chronic oxygen. Discharge plan was discussed with patient's daughter [name] [name] on the phone 6/13 Pt will benefit from put pt Palliative care--referral sent    Family communication :dter [name] [name] 6/13 Consults :none CODE STATUS: Full DVT Prophylaxis :Heparin        Disposition: Home health Diet recommendation:  Discharge Diet Orders (From admission, onward)     Start     Ordered   05/30/24 0000  Diet - low sodium heart healthy        05/30/24 1135           Cardiac and Carb modified diet DISCHARGE MEDICATION: Allergies as of 05/31/2024       Reactions   Morphine  Other (See Comments)  Very sensitive d/t resp changes that cause hypercarbia        Medication List     STOP taking these medications    furosemide  40 MG tablet Commonly known as: LASIX        TAKE these medications    acetaminophen  325 MG tablet Commonly known as: TYLENOL  Take 650 mg by mouth every 6 (six) hours as needed for headache or mild pain.   albuterol  108 (90 Base) MCG/ACT inhaler Commonly known as: VENTOLIN  HFA Inhale 1-2 puffs into the  lungs every 6 (six) hours as needed.   arformoterol  15 MCG/2ML Nebu Commonly known as: BROVANA  Inhale 15 mcg into the lungs 2 (two) times daily.   budesonide  0.5 MG/2ML nebulizer solution Commonly known as: PULMICORT  Inhale 0.5 mg into the lungs daily.   carboxymethylcellulose 1 % ophthalmic solution Apply 1 drop to eye as needed. For dry eyes   carvedilol  3.125 MG tablet Commonly known as: COREG  Take 1 tablet (3.125 mg total) by mouth 2 (two) times daily.   Cholecalciferol  10 MCG (400 UNIT) Caps Take 400 Units by mouth daily.   Cialis 10 MG tablet Generic drug: tadalafil Take 10 mg by mouth daily as needed.   feeding supplement Liqd Take 237 mLs by mouth 3 (three) times daily between meals.   lisinopril  10 MG tablet Commonly known as: ZESTRIL  Take 5 mg by mouth daily.   loratadine 10 MG tablet Commonly known as: CLARITIN Take 10 mg by mouth daily. For allegies   multivitamin capsule Take 1 capsule by mouth daily.   Ohtuvayre  3 MG/2.5ML Susp Generic drug: Ensifentrine  Inhale 3 mg into the lungs 2 (two) times daily.   omeprazole 20 MG capsule Commonly known as: PRILOSEC Take 1 capsule (20 mg total) by mouth 2 (two) times daily before a meal. What changed: when to take this   revefenacin  175 MCG/3ML nebulizer solution Commonly known as: YUPELRI  Take 175 mcg by nebulization daily.   simvastatin  20 MG tablet Commonly known as: ZOCOR  Take 10 mg by mouth daily.        Discharge Exam: Filed Weights   05/27/24 0318  Weight: 53 kg  GENERAL:  73 y.o.-year-old patient with no acute distress. thin LUNGS: distant breath sounds bilaterally, no wheezing CARDIOVASCULAR: S1, S2 normal. No murmur      NEUROLOGIC: nonfocal  patient is alert and awake SKIN: per RN   Condition at discharge: fair  The results of significant diagnostics from this hospitalization (including imaging, microbiology, ancillary and laboratory) are listed below for reference.   Imaging  Studies: DG ESOPHAGUS W SINGLE CM (SOL OR THIN BA) Result Date: 05/29/2024 CLINICAL DATA:  73 year old male with a history of GERD, on medication, who presented to the ED with complaints of chest pain after eating spaghetti. History of EGD in 2022 reporting tortuous esophagus. States that he feels like solid foods get stuck in his chest and once they pass he develops a burning pain in his epigastric area. History of left pneumonectomy and residual large calcified left pleural rind/collection. EXAM: ESOPHAGUS/BARIUM SWALLOW/TABLET STUDY TECHNIQUE: Single contrast examination was performed using thin liquid barium. This exam was performed by [facility] PA-C, and was supervised and interpreted by Dr. Bertina Broccoli. FLUOROSCOPY: Radiation Exposure Index (as provided by the fluoroscopic device): 20.3 mGy Kerma COMPARISON:  DG ESOPHAGUS W SINGLE CM - 08/25/2021., CT chest 05/27/2024 FINDINGS: Swallowing: Appears normal. No vestibular penetration or aspiration seen. Pharynx: Unremarkable. Esophagus: The esophagus is again moderately tortuous. There is again an anterior  impression on the anterior aspect of the upper esophagus secondary to markedly tortuous thoracic aorta is seen on prior CT 05/27/2024 and prior esophagram 08/25/2021. No definite stricture or narrowing. Esophageal motility: Within normal limits. Hiatal Hernia: None visualized. Gastroesophageal reflux: None visualized with coughing. Ingested 13mm barium tablet: Passed without difficulty into the stomach. Other: Evaluation performed at 55 degree table tilt due to limited patient mobility. IMPRESSION: Moderately tortuous esophagus, unchanged from prior esophagram. No stricture is seen. Electronically Signed   By: Bertina Broccoli M.D.   On: 05/29/2024 16:37   ECHOCARDIOGRAM COMPLETE Result Date: 05/28/2024    ECHOCARDIOGRAM REPORT   Patient Name:   [name] [name] Date of Exam: 05/28/2024 Medical Rec #:  [id number]    Height:       69.0 in Accession #:     [id number]   Weight:       116.8 lb Date of Birth:  1951-07-05   BSA:          1.644 m Patient Age:    72 years     BP:           142/92 mmHg Patient Gender: M            HR:           108 bpm. Exam Location:  [facility] Procedure: 2D Echo, Cardiac Doppler and Color Doppler (Both Spectral and Color            Flow Doppler were utilized during procedure). Indications:     Chest pain R07.9  History:         Patient has prior history of Echocardiogram examinations, most                  recent 08/20/2021. CHF; Risk Factors:Diabetes and Hypertension.  Sonographer:     Broadus Canes Referring Phys:  [id number] SARA-MAIZ A THOMAS Diagnosing Phys: Sammy Crisp MD  Sonographer Comments: Technically challenging study due to limited acoustic windows, no parasternal window and no apical window. Image acquisition challenging due to patient body habitus. IMPRESSIONS  1. Left ventricular ejection fraction, by estimation, is 60 to 65%. The left ventricle has normal function. Left ventricular endocardial border not optimally defined to evaluate regional wall motion. Left ventricular diastolic function could not be evaluated.  2. Right ventricular systolic function is normal. The right ventricular size is normal.  3. Large pleural effusion in the left lateral region.  4. The mitral valve is normal in structure. Mild mitral valve regurgitation.  5. The aortic valve is tricuspid. There is moderate thickening of the aortic valve. Aortic valve regurgitation is not visualized. FINDINGS  Left Ventricle: Left ventricular ejection fraction, by estimation, is 60 to 65%. The left ventricle has normal function. Left ventricular endocardial border not optimally defined to evaluate regional wall motion. The left ventricular internal cavity size was normal in size. Suboptimal image quality limits for assessment of left ventricular hypertrophy. Left ventricular diastolic function could not be evaluated. Right Ventricle: The right ventricular size is normal.  Right vetricular wall thickness was not well visualized. Right ventricular systolic function is normal. Left Atrium: Left atrial size was normal in size. Right Atrium: Right atrial size was normal in size. Pericardium: There is no evidence of pericardial effusion. Mitral Valve: The mitral valve is normal in structure. Mild mitral valve regurgitation. Tricuspid Valve: The tricuspid valve is normal in structure. Tricuspid valve regurgitation is trivial. Aortic Valve: The aortic valve is tricuspid. There is moderate thickening of the aortic valve.  Aortic valve regurgitation is not visualized. Pulmonic Valve: The pulmonic valve was normal in structure. Pulmonic valve regurgitation is not visualized. No evidence of pulmonic stenosis. Aorta: The aortic root was not well visualized. Pulmonary Artery: The pulmonary artery is of normal size. IAS/Shunts: The interatrial septum was not well visualized. Additional Comments: There is a large pleural effusion in the left lateral region.  LEFT VENTRICLE PLAX 2D LVIDd:         4.30 cm LVIDs:         2.90 cm LV PW:         1.00 cm LV IVS:        0.60 cm  LEFT ATRIUM         Index LA diam:    3.80 cm 2.31 cm/m  TRICUSPID VALVE TR Peak grad:   11.8 mmHg TR Vmax:        172.00 cm/s Sammy Crisp MD Electronically signed by Sammy Crisp MD Signature Date/Time: 05/28/2024/4:45:16 PM    Final    DG Chest Port 1 View Result Date: 05/27/2024 CLINICAL DATA:  Hypoxemia EXAM: PORTABLE CHEST 1 VIEW COMPARISON:  Chest radiograph dated 05/27/2024 at 3:25 a.m. FINDINGS: Similar complete opacification of the left hemithorax demonstrating peripheral calcification with continued rightward deviation of the trachea and mediastinum status post left pneumonectomy. Right lung is well aerated. No focal consolidations. No right pleural effusion or pneumothorax. Left heart border is obscured. No acute osseous abnormality. IMPRESSION: 1. Similar postsurgical changes of left pneumonectomy with complete  opacification of the left hemithorax. 2. No focal consolidation of the right lung. Electronically Signed   By: Limin  Xu M.D.   On: 05/27/2024 19:48   CT HEAD WO CONTRAST ( ) Result Date: 05/27/2024 CLINICAL DATA:  Altered mental status. EXAM: CT HEAD WITHOUT CONTRAST TECHNIQUE: Contiguous axial images were obtained from the base of the skull through the vertex without intravenous contrast. RADIATION DOSE REDUCTION: This exam was performed according to the departmental dose-optimization program which includes automated exposure control, adjustment of the mA and/or kV according to patient size and/or use of iterative reconstruction technique. COMPARISON:  None Available. FINDINGS: Brain: No evidence of intracranial hemorrhage, acute infarction, hydrocephalus, extra-axial collection, or mass lesion/mass effect. Vascular:  No hyperdense vessel or other acute findings. Skull: No evidence of fracture or other significant bone abnormality. Sinuses/Orbits:  No acute findings. Other: None. IMPRESSION: Negative noncontrast head CT. Electronically Signed   By: Marlyce Sine M.D.   On: 05/27/2024 07:47   CT Angio Chest PE W and/or Wo Contrast Result Date: 05/27/2024 CLINICAL DATA:  73 year old male with chest pain and shortness of breath. History of a left pneumonectomy with residual large calcified left pleural rind/collection. EXAM: CT ANGIOGRAPHY CHEST WITH CONTRAST TECHNIQUE: Multidetector CT imaging of the chest was performed using the standard protocol during bolus administration of intravenous contrast. Multiplanar CT image reconstructions and MIPs were obtained to evaluate the vascular anatomy. RADIATION DOSE REDUCTION: This exam was performed according to the departmental dose-optimization program which includes automated exposure control, adjustment of the mA and/or kV according to patient size and/or use of iterative reconstruction technique. CONTRAST:  75mL OMNIPAQUE  IOHEXOL  350 MG/ML SOLN COMPARISON:  CTA  chest 09/25/2014. Portable chest radiographs 0325 hours today. FINDINGS: Cardiovascular: Excellent contrast bolus timing in the pulmonary arterial tree. 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 No pulmonary artery filling defect identified. Chronic intrathoracic mass effect from left lung findings (below) stable since 2015. Heart size remains  within normal limits. No pericardial effusion. Stable visible aorta, negative aside from atherosclerosis and surrounding postoperative changes. Mediastinum/Nodes: Chronic mediastinal shift and postoperative changes with no significant change since 2015. Lungs/Pleura: Chronic large and rim calcified oval fluid collection occupying the left hemithorax with chronic mass effect on the mediastinum appears unchanged from a 2022 CT with contrast and has low to intermediate density fluid content as before (coronal image 59). Left pneumonectomy changes superimposed, blind-ending left mainstem bronchus unchanged from prior exams. Trachea and right lung major airways remain patent. Centrilobular right lung emphysema. No right pleural effusion, mild dependent atelectasis now in the right costophrenic angle. But no right pleural effusion and otherwise no acute or suspicious right lung opacity. Upper Abdomen: Partially visible small but circumscribed low-density area in the posterior right hepatic lobe on series 5, image 200 is most compatible with benign cyst or hemangioma (no follow-up imaging recommended). Otherwise negative visible essentially noncontrast liver, gallbladder, spleen, pancreas, adrenal glands and kidneys. Upper abdominal stomach and bowel contents mixed with some oral contrast type material. No pneumoperitoneum or upper abdominal free fluid identified. Musculoskeletal: Stable visualized osseous structures. Review of the MIP images confirms the above findings. IMPRESSION: 1. Negative for pulmonary embolus. 2. Stable  chronic lung disease and postoperative appearance of the chest: - left pneumonectomy with large chronic rim calcified fluid collection in the left hemithorax with chronic mass effect on the mediastinum - right lung Emphysema (ICD10-J43.9). 3.  Aortic Atherosclerosis (ICD10-I70.0). Electronically Signed   By: Marlise Simpers M.D.   On: 05/27/2024 05:26   DG Chest Portable 1 View Result Date: 05/27/2024 CLINICAL DATA:  Chest pain and shortness of breath EXAM: PORTABLE CHEST 1 VIEW COMPARISON:  Radiograph 01/07/2024 FINDINGS: Left pneumonectomy with complete opacification of the left hemithorax. The right lung is clear. Stable cardiomediastinal silhouette. No displaced rib fractures. IMPRESSION: No acute abnormality.  Left pneumonectomy. Electronically Signed   By: Rozell Cornet M.D.   On: 05/27/2024 03:34    Microbiology: Results for orders placed or performed during the hospital encounter of 05/27/24  MRSA Next Gen by PCR, Nasal     Status: None   Collection Time: 05/29/24 10:08 AM   Specimen: Nasal Mucosa; Nasal Swab  Result Value Ref Range Status   MRSA by PCR Next Gen NOT DETECTED NOT DETECTED Final    Comment: (NOTE) The GeneXpert MRSA Assay (FDA approved for NASAL specimens only), is one component of a comprehensive MRSA colonization surveillance program. It is not intended to diagnose MRSA infection nor to guide or monitor treatment for MRSA infections. Test performance is not FDA approved in patients less than 1 years old. Performed at [facility], [address] [place], [place] [address]     Labs: CBC: Recent Labs  Lab 05/27/24 2130 05/27/24 1432 05/27/24 1800 05/28/24 0323  WBC 5.1 5.4 5.4 4.8  NEUTROABS 3.8  --   --   --   HGB 10.0* 10.0* 9.4* 9.1*  HCT 33.6* 33.6* 31.7* 30.6*  MCV 96.3 96.6 97.5 96.5  PLT 137* 142* 132* 133*   Basic Metabolic Panel: Recent Labs  Lab 05/27/24 0337 05/27/24 1432 05/27/24 1800 05/28/24 0323  NA 137  --  140 138  K 3.9  --   4.7 4.5  CL 85*  --  87* 88*  CO2 43*  --  43* 40*  GLUCOSE 142*  --  76 94  BUN 20  --  21 22  CREATININE 0.80 0.83 0.73 0.83  CALCIUM 9.4  --  9.2 9.3  MG 2.0  --  2.2  --    Liver Function Tests: Recent Labs  Lab 05/27/24 0337 05/27/24 1800 05/28/24 0323  AST 23 25 25   ALT 19 22 22   ALKPHOS 54 56 55  BILITOT 0.5 0.6 0.9  PROT 7.2 6.6 6.4*  ALBUMIN 4.0 3.6 3.5   CBG: Recent Labs  Lab 05/29/24 2113 05/30/24 0727 05/30/24 1142 05/30/24 1621 05/31/24 0652  GLUCAP 110* 91 169* 96 88    Discharge time spent: greater than 30 minutes.  Signed: Melvinia Stager, MD [facility] 05/31/2024

## 2024-06-02 NOTE — Telephone Encounter (Signed)
 Completed Ohtuvayre  enrollment (non-VA) form and faxed with clinicals and insurance card copy to [facility]   Phone#: [phone] Fax#: [phone]   Geraldene Kleine, PharmD, MPH, BCPS, CPP Clinical Pharmacist (Rheumatology and Pulmonology)

## 2024-06-03 NOTE — Telephone Encounter (Signed)
 Received fax from [employer] with summary of benefits. Referral form for Ohtuvayre  received. Rx will be triaged to [facility].. Once benefits investigation completed, pharmacy will reach out the patient to schedule shipment. If medication is unaffordable, patient will need to express financial hardship to be referred back to [facility] for patient assistance program pre-screening.   Patient ID: [id number] Pharmacy phone: [phone] [facility] Phone#: [phone]

## 2024-06-06 ENCOUNTER — Telehealth (HOSPITAL_BASED_OUTPATIENT_CLINIC_OR_DEPARTMENT_OTHER): Payer: Self-pay | Admitting: Pulmonary Disease

## 2024-06-06 ENCOUNTER — Ambulatory Visit: Payer: Self-pay

## 2024-06-06 NOTE — Telephone Encounter (Signed)
 Reason for Disposition  Lots of coughing  Answer Assessment - Initial Assessment Questions 1. LOCATION: Where does it hurt?      Sinuses 2. ONSET: When did the sinus pain start?  (e.g., hours, days)      2-3 days  3. SEVERITY: How bad is the pain?   (Scale 1-10; mild, moderate or severe)   - MILD (1-3): doesn't interfere with normal activities    - MODERATE (4-7): interferes with normal activities (e.g., work or school) or awakens from sleep   - SEVERE (8-10): excruciating pain and patient unable to do any normal activities        No pain  4. RECURRENT SYMPTOM: Have you ever had sinus problems before? If Yes, ask: When was the last time? and What happened that time?      Yes, happens when running the [facility], treated with Prednisone   5. NASAL CONGESTION: Is the nose blocked? If Yes, ask: Can you open it or must you breathe through your mouth?     Yes, congestion  6. NASAL DISCHARGE: Do you have discharge from your nose? If so ask, What color?     No 7. FEVER: Do you have a fever? If Yes, ask: What is it, how was it measured, and when did it start?      No 8. OTHER SYMPTOMS: Do you have any other symptoms? (e.g., sore throat, cough, earache, difficulty breathing)     Cough, mild shortness of breath  Protocols used: Sinus Pain or Congestion-A-AH   Patient declined an appointment.   FYI Only or Action Required?: Action required by provider: medication refill request.  Patient is followed in Pulmonology for COPD, last seen on 01/07/2024 by Quillian Brunt, MD. Called Nurse Triage reporting Nasal Congestion. Symptoms began 2-3 days ago. Symptoms are: gradually worsening.  Triage Disposition: See PCP When Office is Open (Within 3 Days)  Patient/caregiver understands and will follow disposition?: No, wishes to speak with PCP

## 2024-06-06 NOTE — Telephone Encounter (Signed)
**Note De-identified  [place] Obfuscation** Please advise 

## 2024-06-06 NOTE — Telephone Encounter (Signed)
 Copied from CRM [id number]. Topic: Clinical - Medication Refill >> Jun 06, 2024  2:25 PM Hilton Lucky wrote: Medication: Prednisone  20mg    Has the patient contacted their pharmacy? No - no refills  This is the patient's preferred pharmacy:  [id number] [place], [place] - [address] AT [facility] OF [address] [place] [address] [place] [address] Phone: [phone] Fax: [phone]  Is this the correct pharmacy for this prescription? Yes If no, delete pharmacy and type the correct one.   Has the prescription been filled recently? No  Is the patient out of the medication? Yes  Has the patient been seen for an appointment in the last year OR does the patient have an upcoming appointment? Yes  Can we respond through MyChart? Yes  Agent: Please be advised that Rx refills may take up to 3 business days. We ask that you follow-up with your pharmacy.   ----------------------------------------------------------------------- From previous Reason for Contact - Medication Question: Reason for CRM:

## 2024-06-15 ENCOUNTER — Other Ambulatory Visit (HOSPITAL_BASED_OUTPATIENT_CLINIC_OR_DEPARTMENT_OTHER): Payer: Self-pay | Admitting: Pulmonary Disease

## 2024-06-16 ENCOUNTER — Other Ambulatory Visit (HOSPITAL_BASED_OUTPATIENT_CLINIC_OR_DEPARTMENT_OTHER): Payer: Self-pay

## 2024-06-16 MED ORDER — PREDNISONE 20 MG PO TABS: 20.0000 mg | ORAL_TABLET | Freq: Every day | ORAL | 0 refills | Status: DC

## 2024-06-16 NOTE — Telephone Encounter (Signed)
 Patient is calling back conmcerning a medication refill and is requesting to speak with someone at [facility] . Called cal line and spoke with muffy and she said she would send a message back for a nurse to give him a call . When speaking with ms muffy patient disconnected tried calling patient back but no response . Please reach out to patient concerning this medication refill

## 2024-06-16 NOTE — Telephone Encounter (Signed)
 Please advise I do not see where he has been given Prednisone  recently

## 2024-07-16 ENCOUNTER — Telehealth (HOSPITAL_BASED_OUTPATIENT_CLINIC_OR_DEPARTMENT_OTHER): Payer: Self-pay

## 2024-07-16 NOTE — Telephone Encounter (Signed)
 Copied from CRM #[id number]. Topic: Clinical - Medication Question >> Jul 15, 2024  2:55 PM Russell PARAS wrote: Reason for CRM:   Pt is requesting to speak with [facility]'s nurse concerning questions with several of his medications:  Brovana  Pulmicort  Yupelri   Requested call back  CB#  [phone]

## 2024-07-17 NOTE — Telephone Encounter (Signed)
 I called and spoke with the pt  He has concerns about meds- [facility] spiriva  and wixela to yupelri , budesonide  and perforomist  He was worried these would not be sufficient and still has some wixela and spiriva , so has started back on that  I went ahead and scheduled him for JE 09/01/24 and will discuss then  Nothing further needed at this time

## 2024-08-26 NOTE — Telephone Encounter (Signed)
 Called patient. He reports copay >$500 and that he expressed financial hardship. He reports he spoke with a representative but did not hear back on the outcome. He was unsure if he was speaking with [employer] or [facility] [employer]. Reports he was told the application was still in process as of last week.   Called [employer] Per representative, they reached out to patient regarding financial assistance in July and left a VM. Patient did not call back. They are awaiting call from patient.   Called patient again to discuss - provided information below and encouraged patient to call [employer] today. He will update office if he runs into barriers.   Patient ID: [id number]  [facility] Phone#: [phone]   Aleck Puls, PharmD, BCPS Clinical Pharmacist  [facility]

## 2024-08-26 NOTE — Telephone Encounter (Signed)
 Can someone update me on the cost of the medication after the investigation was completed? And if patient picked up medication?

## 2024-09-01 ENCOUNTER — Encounter (HOSPITAL_BASED_OUTPATIENT_CLINIC_OR_DEPARTMENT_OTHER): Payer: Self-pay | Admitting: Pulmonary Disease

## 2024-09-01 ENCOUNTER — Ambulatory Visit (HOSPITAL_BASED_OUTPATIENT_CLINIC_OR_DEPARTMENT_OTHER): Admitting: Pulmonary Disease

## 2024-09-01 NOTE — Progress Notes (Deleted)
 Subjective:   PATIENT ID: [name] [name] GENDER: male DOB: 12-13-1951, MRN: [id number]   HPI  No chief complaint on file.  Reason for Visit: Follow-up  [name] [name] is a 73 year old male prior tobacco abuse (reported 10 pack years) with emphysema, mild/mod pulmonary hypertension, history of squamous cell lung cancer status postpneumonectomy in 2005 and chemotherapy, hyperlipidemia, hypertension and type 2 diabetes who presents for follow-up.  He was recently hospitalized for acute hypoxemic and hypercarbic respiratory failure secondary to right heart failure. Right heart cath with mild-moderate PH.  Left pleural fluid likely chronic due to his pneumonectomy. No evidence of empyema on 9/2 pleural studies. For his chronic hypercarbic failure he was recommended for NIV prior to discharge. His respiratory failure is likely a sequelae of right pneumonectomy for squamous cell lung cancer resulting in right pulmonary hypertension and right heart failure in the setting of chronic lung disease.   01/02/22 He reports he has been struggling to breath despite being on BiPAP. He will awaken at times from not breathing. He reports worsening nasal congestion since using oxygen and NIV. He reports headaches that began five days. Daughter presents for additional history. He seems to be gasping with speech despite compliance with oxygen. Denies coughing or wheezing. He is able to walk briskly but feels fatigued after activity.  04/10/22 Since our last visit, he is compliant with his O2. He is using 3-4L. Has not started pulmonary rehab. He has lost motivation after being denied for lung transplant. At baseline, he is walking around the house. He is compliant with his NIV. He reports difficulty tolerating the machine and will awake him at night.  07/24/22 Since our last visit he has been complaint with 3L O2. Pulmonary rehab orientation has been completed and scheduled 8/22. He reports worsening shortness of  breath and sore throat that began yesterday. Sometimes this will progress but since it is early he is not sure. He is interested in [employer] device and previously qualified at last visit. Has been compliant with his NIV nightly and recent sleep study was negative for OSA, unfortunately TCO2 not available.  12/07/22 Since our last visit his daughter was diagnosed with cancer. Was unable to complete rehab due to home situation. He reports that his breathing is fair. He is planning to walk more. Scheduled with nutritionist at [facility] in January. Compliant with trilogy, new [employer] respironics trilogy device. Compliant with NIV however its uncomfortable. Uses portable oxygen. Has shortness of breath with exertion. Worsening productive cough with phlegm. No wheezing. Similar to his prior exacerbations which was improved on low-dose steroids.  04/19/23 He reports his shortness of breath is unchanged. He is compliant with Wixela and Spiriva . He is not sleeping as well and staying up late at night until early morning. Sleeping late and not eating regularly due to this. He is struggling with his Trilogy. Reports alarms due to low inspiratory pressure. Compliant with his oxygen.   01/07/24 Since our last he has exacerbation in October/November and more recently 12/10/23. Compliant with Wixela and Spiriva . However he continues to have good and bad days related to his shortness of breath. He reports less active and sleeping more. This has kept him from being less social and feeling like he is at home more. He has kept his house clean including carpet and curtains washed. Patient is also asking about risk of propane heater in home.  Social History: Former smoker  Past Medical History:  Diagnosis Date   Cancer (HCC)  lung   CHF (congestive heart failure) (HCC)    Diabetes mellitus without complication (HCC)    Dyspnea    Hyperlipidemia    Hypertension     Allergies  Allergen Reactions   Morphine  Other (See  Comments)    Very sensitive d/t resp changes that cause hypercarbia     Outpatient Medications Prior to Visit  Medication Sig Dispense Refill   acetaminophen  (TYLENOL ) 325 MG tablet Take 650 mg by mouth every 6 (six) hours as needed for headache or mild pain.     albuterol  (VENTOLIN  HFA) 108 (90 Base) MCG/ACT inhaler Inhale 1-2 puffs into the lungs every 6 (six) hours as needed.     arformoterol  (BROVANA ) 15 MCG/2ML NEBU Inhale 15 mcg into the lungs 2 (two) times daily.     budesonide  (PULMICORT ) 0.5 MG/2ML nebulizer solution Inhale 0.5 mg into the lungs daily.     carboxymethylcellulose 1 % ophthalmic solution Apply 1 drop to eye as needed. For dry eyes     carvedilol  (COREG ) 3.125 MG tablet Take 1 tablet (3.125 mg total) by mouth 2 (two) times daily. 60 tablet 0   Cholecalciferol  10 MCG (400 UNIT) CAPS Take 400 Units by mouth daily.     Ensifentrine  (OHTUVAYRE ) 3 MG/2.5ML SUSP Inhale 3 mg into the lungs 2 (two) times daily.     feeding supplement (ENSURE PLUS HIGH PROTEIN) LIQD Take 237 mLs by mouth 3 (three) times daily between meals. 237 mL 0   lisinopril  (ZESTRIL ) 10 MG tablet Take 5 mg by mouth daily.     loratadine (CLARITIN) 10 MG tablet Take 10 mg by mouth daily. For allegies     Multiple Vitamin (MULTIVITAMIN) capsule Take 1 capsule by mouth daily.     omeprazole  (PRILOSEC) 20 MG capsule Take 1 capsule (20 mg total) by mouth 2 (two) times daily before a meal. 69 capsule 1   predniSONE  (DELTASONE ) 20 MG tablet Take 1 tablet (20 mg total) by mouth daily with breakfast. 5 tablet 0   revefenacin  (YUPELRI ) 175 MCG/3ML nebulizer solution Take 175 mcg by nebulization daily.     simvastatin  (ZOCOR ) 20 MG tablet Take 10 mg by mouth daily.     tadalafil (CIALIS) 10 MG tablet Take 10 mg by mouth daily as needed.     No facility-administered medications prior to visit.    Review of Systems  Constitutional:  Positive for malaise/fatigue. Negative for chills, diaphoresis, fever and weight  loss.  HENT:  Negative for congestion.   Respiratory:  Positive for shortness of breath. Negative for cough, hemoptysis, sputum production and wheezing.   Cardiovascular:  Negative for chest pain, palpitations and leg swelling.     Objective:   There were no vitals filed for this visit.      Physical Exam: General: Thin-appearing, no acute distress HENT: [place], AT Eyes: EOMI, no scleral icterus Respiratory: Absent left air entry. Clear to auscultation bilaterally.  No crackles, wheezing or rales Cardiovascular: RRR, -M/R/G, no JVD Extremities:-Edema,-tenderness Neuro: AAO x4, CNII-XII grossly intact Psych: Normal mood, normal affect  Data Reviewed:  Imaging: CXR 08/26/21 S/p left pneumonectomy. Complete opacification of left hemithorax. Pleural based calcifications. No infiltrate effusion or edema. CXR 01/07/24 S/p left pneumonectomy. Complete opacification of left hemithorax. Pleural based calcifications. No infiltrate effusion or edema.  PFT: [facility] 10/31/21  FVC 1.17 (30%) FEV1 0.42 (14%) Ratio 36  TLC 62% DLCO 7.8% Interpretation: Very severe obstructive and restrictive defect with severely reduced gas exchange  Labs: CBC  Component Value Date/Time   WBC 4.8 05/28/2024 0323   RBC 3.17 (L) 05/28/2024 0323   HGB 9.1 (L) 05/28/2024 0323   HGB 12.2 (L) 09/26/2014 0431   HCT 30.6 (L) 05/28/2024 0323   HCT 36.9 (L) 09/26/2014 0431   PLT 133 (L) 05/28/2024 0323   PLT 199 09/26/2014 0431   MCV 96.5 05/28/2024 0323   MCV 88 09/26/2014 0431   MCH 28.7 05/28/2024 0323   MCHC 29.7 (L) 05/28/2024 0323   RDW 11.9 05/28/2024 0323   RDW 13.1 09/26/2014 0431   LYMPHSABS 0.7 05/27/2024 0337   LYMPHSABS 1.9 09/26/2014 0431   MONOABS 0.4 05/27/2024 0337   MONOABS 0.7 09/26/2014 0431   EOSABS 0.1 05/27/2024 0337   EOSABS 0.2 09/26/2014 0431   BASOSABS 0.0 05/27/2024 0337   BASOSABS 0.1 09/26/2014 0431   Absolute eosinophil  09/26/2014-200 09/05/2021-100  ABG     Component Value Date/Time   PHART 7.4 05/28/2024 0017   PCO2ART 93 (HH) 05/28/2024 0017   PO2ART 63 (L) 05/28/2024 0017   HCO3 57.6 (H) 05/28/2024 0017   TCO2 43 (H) 08/24/2021 1234   TCO2 45 (H) 08/24/2021 1234   O2SAT 94.1 05/28/2024 0017   SD Card Compliance Usage Days 09/15/21-11/13/21 - 53/59 days (89%) >4 hours - 85% Average Usage - 5 hours 21 min Average AHI 1.4    Assessment & Plan:   Discussion: 73 year old male with multiple co-morbidities including squamous cell lung cancer s/p left pneumonectomy with chronic pleural effusion, emphysema, moderate pulmonary HTN WHO group III who presents for follow-up. Not in exacerbation. Compliant with triple therapy, oxygen, NIV. His lung disease is progressing worsened in setting of deconditioning. Consider adding Ohtuvayre .  Discussed clinical course and management of COPD including bronchodilator regimen, preventive care including vaccinations and action plan for exacerbation.  Prior vent settings AVAPS-AE TV 600 ml >>> 430 cc Max Pressure 32 cm H20 EPAP min/Max 4/20 PS Min/Max 4/20 Inspiratory 1.4 sec HR 12 BMP  Severe emphysema  --CONTINUE Spiriva  2.5 TWO puffs ONCE a day --CONTINUE Wixela 250 ONE puff TWICE a day --REFER to outpatient [facility] therapy --Provided information on Ohtuvayre . [facility] --CXR ordered for today. Stable pneumonectomy. No acute changes  Chronic hypoxemic and hypercapneic respiratory failure  --SpO2 goal >88% --CONTINUE supplemental O2 with 3L on exertion. Has POC  >Will need ambulatory O2 at next visit by 07/2024 to re-qualify for oxygen --CONTINUE non-invasive ventilation nightly with new tidal volume as noted above --[facility] sleep study negative for OSA. Demonstrated nocturnal hypoxemia --Recommend against indoor propane heater due to risk of oxygen consumption. Prefer indoor electric heater if needed in well ventilated area.  Mild-moderate pulmonary hypertension, WHO  group III --Not a candidate for selective pulmonary artery vasodilators  Health Maintenance Immunization History  Administered Date(s) Administered    sv, Bivalent, Protein Subunit Rsvpref,pf (Abrysvo) 01/22/2023   Fluad Quad(high Dose 65+) 09/09/2021   INFLUENZA, HIGH DOSE SEASONAL PF 10/07/2019, 10/16/2022, 11/22/2023   Influenza, Seasonal, Injecte, Preservative Fre 03/15/2010   Influenza-Unspecified 08/23/2016, 10/07/2019, 09/09/2021, 10/16/2022   Moderna Covid-19 Fall Seasonal Vaccine 92yrs & older 10/16/2022, 11/22/2023   Moderna Sars-Covid-2 Vaccination 01/28/2020, 02/25/2020, 10/19/2020   Pneumococcal Conjugate-13 10/07/2019   Pneumococcal Polysaccharide-23 05/24/2016   Tdap 09/27/2021   Zoster Recombinant(Shingrix) 09/27/2021, 01/03/2022   CT Lung Screen - not qualified  No orders of the defined types were placed in this encounter.  No orders of the defined types were placed in this encounter.  No  follow-ups on file.  I have spent a total time of 45-minutes on the day of the appointment including chart review, data review, collecting history, coordinating care and discussing medical diagnosis and plan with the patient/family. Past medical history, allergies, medications were reviewed. Pertinent imaging, labs and tests included in this note have been reviewed and interpreted independently by me.  Akhil Piscopo Slater Staff, MD [facility] 09/01/2024 1:07 PM  Office Number [phone]

## 2024-09-02 ENCOUNTER — Telehealth: Payer: Self-pay | Admitting: General Practice

## 2024-09-02 NOTE — Telephone Encounter (Signed)
 Copied from CRM [id number]. Topic: General - Call Back - No Documentation >> Sep 02, 2024  3:46 PM Nathanel DEL wrote: Reason for CRM: pt  would like a call from Dr Carlen herself  pt declined triage, but did admit this is about his breathing and the appt he missed yesterday

## 2024-09-03 ENCOUNTER — Ambulatory Visit (HOSPITAL_BASED_OUTPATIENT_CLINIC_OR_DEPARTMENT_OTHER): Payer: Self-pay | Admitting: Pulmonary Disease

## 2024-09-03 NOTE — Telephone Encounter (Signed)
 FYI Only or Action Required?: FYI only for provider.  Patient is followed in Pulmonology for COPD, last seen on 01/07/2024 by Kassie Acquanetta Bradley, MD.  Called Nurse Triage reporting Shortness of Breath.  Symptoms began a week ago.  Symptoms are: worse today.  Triage Disposition: Go to ED Now (Notify PCP)  Patient/caregiver understands and will follow disposition?: Yes      Copied from CRM [id number]. Topic: Clinical - Red Word Triage >> Sep 03, 2024  1:03 PM Ismael A wrote: [facility] that prompted transfer to Nurse Triage: SOB and pain in chest when breathing       Reason for Disposition  [1] MODERATE difficulty breathing (e.g., speaks in phrases, SOB even at rest, pulse 100-120) AND [2] NEW-onset or WORSE than normal  Answer Assessment - Initial Assessment Questions 1. RESPIRATORY STATUS: Describe your breathing? (e.g., wheezing, shortness of breath, unable to speak, severe coughing)      Shortness of breath  2. ONSET: When did this breathing problem begin?       1 week, worse today 3. PATTERN Does the difficult breathing come and go, or has it been constant since it started?      Constant  4. SEVERITY: How bad is your breathing? (e.g., mild, moderate, severe)      I've never had it this bad before 5. RECURRENT SYMPTOM: Have you had difficulty breathing before? If Yes, ask: When was the last time? and What happened that time?      Yes 6. CARDIAC HISTORY: Do you have any history of heart disease? (e.g., heart attack, angina, bypass surgery, angioplasty)      Yes 7. LUNG HISTORY: Do you have any history of lung disease?  (e.g., pulmonary embolus, asthma, emphysema)     Yes 8. CAUSE: What do you think is causing the breathing problem?      Has not been sleeping with CPAP on 9. OTHER SYMPTOMS: Do you have any other symptoms? (e.g., chest pain, cough, dizziness, fever, runny nose)     Feels pressure on chest  Protocols used: Breathing  Difficulty-A-AH

## 2024-09-03 NOTE — Telephone Encounter (Signed)
 FYI pt to ED

## 2024-09-03 NOTE — Telephone Encounter (Signed)
 Made pt appt for video visit next available on 09/17/2024 as Dr Kassie is in clinic

## 2024-09-04 ENCOUNTER — Other Ambulatory Visit: Payer: Self-pay

## 2024-09-04 ENCOUNTER — Inpatient Hospital Stay (HOSPITAL_COMMUNITY)
Admission: EM | Admit: 2024-09-04 | Discharge: 2024-09-13 | DRG: 189 | Disposition: A | Discharge status: Home / Self Care | Attending: Internal Medicine | Admitting: Internal Medicine

## 2024-09-04 ENCOUNTER — Emergency Department (HOSPITAL_COMMUNITY)

## 2024-09-04 DIAGNOSIS — Z7952 Long term (current) use of systemic steroids: Secondary | ICD-10-CM

## 2024-09-04 DIAGNOSIS — J441 Chronic obstructive pulmonary disease with (acute) exacerbation: Principal | ICD-10-CM | POA: Diagnosis present

## 2024-09-04 DIAGNOSIS — J449 Chronic obstructive pulmonary disease, unspecified: Secondary | ICD-10-CM | POA: Diagnosis present

## 2024-09-04 DIAGNOSIS — D638 Anemia in other chronic diseases classified elsewhere: Secondary | ICD-10-CM | POA: Diagnosis present

## 2024-09-04 DIAGNOSIS — Z91199 Patient's noncompliance with other medical treatment and regimen due to unspecified reason: Secondary | ICD-10-CM

## 2024-09-04 DIAGNOSIS — I110 Hypertensive heart disease with heart failure: Secondary | ICD-10-CM | POA: Diagnosis present

## 2024-09-04 DIAGNOSIS — J9622 Acute and chronic respiratory failure with hypercapnia: Principal | ICD-10-CM | POA: Diagnosis present

## 2024-09-04 DIAGNOSIS — N401 Enlarged prostate with lower urinary tract symptoms: Secondary | ICD-10-CM | POA: Diagnosis present

## 2024-09-04 DIAGNOSIS — J9621 Acute and chronic respiratory failure with hypoxia: Secondary | ICD-10-CM | POA: Diagnosis present

## 2024-09-04 DIAGNOSIS — D696 Thrombocytopenia, unspecified: Secondary | ICD-10-CM | POA: Diagnosis present

## 2024-09-04 DIAGNOSIS — Z7951 Long term (current) use of inhaled steroids: Secondary | ICD-10-CM

## 2024-09-04 DIAGNOSIS — E43 Unspecified severe protein-calorie malnutrition: Secondary | ICD-10-CM | POA: Diagnosis present

## 2024-09-04 DIAGNOSIS — Z23 Encounter for immunization: Secondary | ICD-10-CM

## 2024-09-04 DIAGNOSIS — N390 Urinary tract infection, site not specified: Secondary | ICD-10-CM | POA: Diagnosis present

## 2024-09-04 DIAGNOSIS — Z681 Body mass index (BMI) 19 or less, adult: Secondary | ICD-10-CM

## 2024-09-04 DIAGNOSIS — R64 Cachexia: Secondary | ICD-10-CM | POA: Diagnosis present

## 2024-09-04 DIAGNOSIS — E785 Hyperlipidemia, unspecified: Secondary | ICD-10-CM | POA: Diagnosis present

## 2024-09-04 DIAGNOSIS — E873 Alkalosis: Secondary | ICD-10-CM | POA: Diagnosis present

## 2024-09-04 DIAGNOSIS — Z902 Acquired absence of lung [part of]: Secondary | ICD-10-CM

## 2024-09-04 DIAGNOSIS — Z66 Do not resuscitate: Secondary | ICD-10-CM | POA: Diagnosis present

## 2024-09-04 DIAGNOSIS — Z885 Allergy status to narcotic agent status: Secondary | ICD-10-CM

## 2024-09-04 DIAGNOSIS — I5032 Chronic diastolic (congestive) heart failure: Secondary | ICD-10-CM | POA: Diagnosis present

## 2024-09-04 DIAGNOSIS — Z79899 Other long term (current) drug therapy: Secondary | ICD-10-CM

## 2024-09-04 DIAGNOSIS — Z87891 Personal history of nicotine dependence: Secondary | ICD-10-CM

## 2024-09-04 DIAGNOSIS — Z85118 Personal history of other malignant neoplasm of bronchus and lung: Secondary | ICD-10-CM

## 2024-09-04 DIAGNOSIS — R3911 Hesitancy of micturition: Secondary | ICD-10-CM | POA: Diagnosis present

## 2024-09-04 DIAGNOSIS — J439 Emphysema, unspecified: Secondary | ICD-10-CM | POA: Diagnosis present

## 2024-09-04 DIAGNOSIS — Z9981 Dependence on supplemental oxygen: Secondary | ICD-10-CM

## 2024-09-04 DIAGNOSIS — B9620 Unspecified Escherichia coli [E. coli] as the cause of diseases classified elsewhere: Secondary | ICD-10-CM | POA: Diagnosis present

## 2024-09-04 DIAGNOSIS — I272 Pulmonary hypertension, unspecified: Secondary | ICD-10-CM | POA: Diagnosis present

## 2024-09-04 DIAGNOSIS — E119 Type 2 diabetes mellitus without complications: Secondary | ICD-10-CM | POA: Diagnosis present

## 2024-09-04 LAB — BASIC METABOLIC PANEL WITH GFR
Anion gap: 11 (ref 5–15)
BUN: 16 mg/dL (ref 8–23)
CO2: 42 mmol/L — ABNORMAL HIGH (ref 22–32)
Calcium: 9.4 mg/dL (ref 8.9–10.3)
Chloride: 86 mmol/L — ABNORMAL LOW (ref 98–111)
Creatinine, Ser: 0.64 mg/dL (ref 0.61–1.24)
GFR, Estimated: >60 mL/min (ref >60)
Glucose, Bld: 83 mg/dL (ref 70–99)
Potassium: 4.5 mmol/L (ref 3.5–5.1)
Sodium: 139 mmol/L (ref 135–145)

## 2024-09-04 LAB — I-STAT CHEM 8, ED
BUN: 19 mg/dL (ref 8–23)
Calcium, Ion: 1.15 mmol/L (ref 1.15–1.40)
Chloride: 85 mmol/L — ABNORMAL LOW (ref 98–111)
Creatinine, Ser: 0.9 mg/dL (ref 0.61–1.24)
Glucose, Bld: 83 mg/dL (ref 70–99)
HCT: 36 % — ABNORMAL LOW (ref 39.0–52.0)
Hemoglobin: 12.2 g/dL — ABNORMAL LOW (ref 13.0–17.0)
Potassium: 4.5 mmol/L (ref 3.5–5.1)
Sodium: 137 mmol/L (ref 135–145)
TCO2: 46 mmol/L — ABNORMAL HIGH (ref 22–32)

## 2024-09-04 LAB — URINALYSIS, ROUTINE W REFLEX MICROSCOPIC
Bilirubin Urine: NEGATIVE
Glucose, UA: NEGATIVE mg/dL
Hgb urine dipstick: NEGATIVE
Ketones, ur: NEGATIVE mg/dL
Nitrite: NEGATIVE
Protein, ur: 30 mg/dL — AB
Specific Gravity, Urine: 1.014 (ref 1.005–1.030)
WBC, UA: >50 WBC/hpf (ref 0–5)
pH: 7 (ref 5.0–8.0)

## 2024-09-04 LAB — CBC
HCT: 36.5 % — ABNORMAL LOW (ref 39.0–52.0)
Hemoglobin: 10.5 g/dL — ABNORMAL LOW (ref 13.0–17.0)
MCH: 28.4 pg (ref 26.0–34.0)
MCHC: 28.8 g/dL — ABNORMAL LOW (ref 30.0–36.0)
MCV: 98.6 fL (ref 80.0–100.0)
Platelets: 139 K/uL — ABNORMAL LOW (ref 150–400)
RBC: 3.7 MIL/uL — ABNORMAL LOW (ref 4.22–5.81)
RDW: 11.9 % (ref 11.5–15.5)
WBC: 5 K/uL (ref 4.0–10.5)
nRBC: 0 % (ref 0.0–0.2)

## 2024-09-04 NOTE — ED Triage Notes (Signed)
 Pt coming in from home with family . Pt is on 3L [place] at baseline. Pt has an increased work of breathing. Pt reporting abdominal pain as  well.

## 2024-09-04 NOTE — ED Provider Triage Note (Signed)
 Emergency Medicine Provider Triage Evaluation Note  [name] [name] , a 73 y.o. male  was evaluated in triage.  Pt complains of shortness of breath and abdominal pain for 2 weeks.  Review of Systems  Positive: Shortness of breath, abdominal pain, foul odors to urine Negative: Chest pain, nausea, vomiting, diarrhea, constipation  Physical Exam  BP (!) 187/90 (BP Location: Right Arm)   Pulse 91   Temp 98 F (36.7 C)   Resp (!) 24   Ht 5' 9 (1.753 m)   Wt 55.3 kg   SpO2 100%   BMI 18.02 kg/m  Gen:   Awake, no distress   Resp:  In no acute respiratory distress patient is on at home oxygen. MSK:   Patient in wheelchair is able to move upper extremity appropriately. Other:  Decreased lung sounds on the right but no obvious wheeze or rhonchi.  Patient has no pain to palpation in the abdomen  Medical Decision Making  Medically screening exam initiated at 9:43 PM.  Appropriate orders placed.  [name] [name] was informed that the remainder of the evaluation will be completed by another provider, this initial triage assessment does not replace that evaluation, and the importance of remaining in the ED until their evaluation is complete.  73 year old male presents ED with complaints of trouble breathing and abdominal pain x 2 weeks.  Patient has associated weakness and reported foul odors to urine.  Patient has significant history of COPD and is on oxygen at home.  Patient has no pain to palpation throughout abdomen.  Patient has no nausea vomiting diarrhea.  No obvious respiratory distress on initial exam.   Myriam Fonda RAMAN, PA-C 09/04/24 2150

## 2024-09-05 ENCOUNTER — Emergency Department (HOSPITAL_COMMUNITY)

## 2024-09-05 ENCOUNTER — Encounter (HOSPITAL_COMMUNITY): Payer: Self-pay | Admitting: Pediatrics

## 2024-09-05 DIAGNOSIS — B9620 Unspecified Escherichia coli [E. coli] as the cause of diseases classified elsewhere: Secondary | ICD-10-CM | POA: Diagnosis present

## 2024-09-05 DIAGNOSIS — N400 Enlarged prostate without lower urinary tract symptoms: Secondary | ICD-10-CM | POA: Diagnosis not present

## 2024-09-05 DIAGNOSIS — N401 Enlarged prostate with lower urinary tract symptoms: Secondary | ICD-10-CM | POA: Diagnosis present

## 2024-09-05 DIAGNOSIS — J9622 Acute and chronic respiratory failure with hypercapnia: Secondary | ICD-10-CM | POA: Diagnosis present

## 2024-09-05 DIAGNOSIS — J9611 Chronic respiratory failure with hypoxia: Secondary | ICD-10-CM | POA: Diagnosis not present

## 2024-09-05 DIAGNOSIS — M7989 Other specified soft tissue disorders: Secondary | ICD-10-CM | POA: Diagnosis not present

## 2024-09-05 DIAGNOSIS — R3911 Hesitancy of micturition: Secondary | ICD-10-CM | POA: Diagnosis present

## 2024-09-05 DIAGNOSIS — J441 Chronic obstructive pulmonary disease with (acute) exacerbation: Principal | ICD-10-CM

## 2024-09-05 DIAGNOSIS — E873 Alkalosis: Secondary | ICD-10-CM | POA: Diagnosis present

## 2024-09-05 DIAGNOSIS — E785 Hyperlipidemia, unspecified: Secondary | ICD-10-CM | POA: Diagnosis present

## 2024-09-05 DIAGNOSIS — D696 Thrombocytopenia, unspecified: Secondary | ICD-10-CM | POA: Diagnosis present

## 2024-09-05 DIAGNOSIS — I5032 Chronic diastolic (congestive) heart failure: Secondary | ICD-10-CM | POA: Diagnosis present

## 2024-09-05 DIAGNOSIS — I110 Hypertensive heart disease with heart failure: Secondary | ICD-10-CM | POA: Diagnosis present

## 2024-09-05 DIAGNOSIS — N390 Urinary tract infection, site not specified: Secondary | ICD-10-CM | POA: Diagnosis present

## 2024-09-05 DIAGNOSIS — I272 Pulmonary hypertension, unspecified: Secondary | ICD-10-CM

## 2024-09-05 DIAGNOSIS — I10 Essential (primary) hypertension: Secondary | ICD-10-CM | POA: Diagnosis not present

## 2024-09-05 DIAGNOSIS — Z87891 Personal history of nicotine dependence: Secondary | ICD-10-CM | POA: Diagnosis not present

## 2024-09-05 DIAGNOSIS — J449 Chronic obstructive pulmonary disease, unspecified: Secondary | ICD-10-CM | POA: Diagnosis present

## 2024-09-05 DIAGNOSIS — Z23 Encounter for immunization: Secondary | ICD-10-CM | POA: Diagnosis not present

## 2024-09-05 DIAGNOSIS — Z902 Acquired absence of lung [part of]: Secondary | ICD-10-CM | POA: Diagnosis not present

## 2024-09-05 DIAGNOSIS — Z66 Do not resuscitate: Secondary | ICD-10-CM | POA: Diagnosis present

## 2024-09-05 DIAGNOSIS — R64 Cachexia: Secondary | ICD-10-CM | POA: Diagnosis present

## 2024-09-05 DIAGNOSIS — Z85118 Personal history of other malignant neoplasm of bronchus and lung: Secondary | ICD-10-CM

## 2024-09-05 DIAGNOSIS — Z7951 Long term (current) use of inhaled steroids: Secondary | ICD-10-CM | POA: Diagnosis not present

## 2024-09-05 DIAGNOSIS — J9612 Chronic respiratory failure with hypercapnia: Secondary | ICD-10-CM

## 2024-09-05 DIAGNOSIS — D638 Anemia in other chronic diseases classified elsewhere: Secondary | ICD-10-CM | POA: Diagnosis present

## 2024-09-05 DIAGNOSIS — E43 Unspecified severe protein-calorie malnutrition: Secondary | ICD-10-CM | POA: Diagnosis present

## 2024-09-05 DIAGNOSIS — J439 Emphysema, unspecified: Secondary | ICD-10-CM | POA: Diagnosis present

## 2024-09-05 DIAGNOSIS — E119 Type 2 diabetes mellitus without complications: Secondary | ICD-10-CM | POA: Diagnosis present

## 2024-09-05 DIAGNOSIS — J9621 Acute and chronic respiratory failure with hypoxia: Secondary | ICD-10-CM | POA: Diagnosis present

## 2024-09-05 DIAGNOSIS — Z681 Body mass index (BMI) 19 or less, adult: Secondary | ICD-10-CM | POA: Diagnosis not present

## 2024-09-05 LAB — I-STAT VENOUS BLOOD GAS, ED
Acid-Base Excess: 23 mmol/L — ABNORMAL HIGH (ref 0.0–2.0)
Bicarbonate: 51.5 mmol/L — ABNORMAL HIGH (ref 20.0–28.0)
Calcium, Ion: 1.07 mmol/L — ABNORMAL LOW (ref 1.15–1.40)
HCT: 35 % — ABNORMAL LOW (ref 39.0–52.0)
Hemoglobin: 11.9 g/dL — ABNORMAL LOW (ref 13.0–17.0)
O2 Saturation: 76 %
Potassium: 4.5 mmol/L (ref 3.5–5.1)
Sodium: 136 mmol/L (ref 135–145)
TCO2: >50 mmol/L — ABNORMAL HIGH (ref 22–32)
pCO2, Ven: 73.2 mmHg (ref 44–60)
pH, Ven: 7.456 — ABNORMAL HIGH (ref 7.25–7.43)
pO2, Ven: 42 mmHg (ref 32–45)

## 2024-09-05 LAB — CBC
HCT: 35.1 % — ABNORMAL LOW (ref 39.0–52.0)
Hemoglobin: 10.3 g/dL — ABNORMAL LOW (ref 13.0–17.0)
MCH: 28.9 pg (ref 26.0–34.0)
MCHC: 29.3 g/dL — ABNORMAL LOW (ref 30.0–36.0)
MCV: 98.6 fL (ref 80.0–100.0)
Platelets: 148 K/uL — ABNORMAL LOW (ref 150–400)
RBC: 3.56 MIL/uL — ABNORMAL LOW (ref 4.22–5.81)
RDW: 12 % (ref 11.5–15.5)
WBC: 6.1 K/uL (ref 4.0–10.5)
nRBC: 0 % (ref 0.0–0.2)

## 2024-09-05 LAB — HEPATIC FUNCTION PANEL
ALT: 19 U/L (ref 0–44)
AST: 25 U/L (ref 15–41)
Albumin: 3.7 g/dL (ref 3.5–5.0)
Alkaline Phosphatase: 62 U/L (ref 38–126)
Bilirubin, Direct: 0.2 mg/dL (ref 0.0–0.2)
Indirect Bilirubin: 0 mg/dL — ABNORMAL LOW (ref 0.3–0.9)
Total Bilirubin: 0.2 mg/dL (ref 0.0–1.2)
Total Protein: 6.6 g/dL (ref 6.5–8.1)

## 2024-09-05 LAB — D-DIMER, QUANTITATIVE: D-Dimer, Quant: 2.01 ug{FEU}/mL — ABNORMAL HIGH (ref 0.00–0.50)

## 2024-09-05 LAB — BRAIN NATRIURETIC PEPTIDE: B Natriuretic Peptide: 97.7 pg/mL (ref 0.0–100.0)

## 2024-09-05 LAB — CREATININE, SERUM
Creatinine, Ser: 0.65 mg/dL (ref 0.61–1.24)
GFR, Estimated: >60 mL/min (ref >60)

## 2024-09-05 MED ORDER — METHYLPREDNISOLONE SODIUM SUCC 40 MG IJ SOLR: 40.0000 mg | Freq: Every day | INTRAMUSCULAR | Status: DC

## 2024-09-05 MED ORDER — POLYETHYLENE GLYCOL 3350 17 G PO PACK: 17.0000 g | PACK | Freq: Every day | ORAL | Status: DC | PRN

## 2024-09-05 MED ORDER — REVEFENACIN 175 MCG/3ML IN SOLN
175.0000 ug | Freq: Every day | RESPIRATORY_TRACT | Status: DC
  Administered 2024-09-06 – 2024-09-13 (×8): 175 ug via RESPIRATORY_TRACT
  Filled 2024-09-05 (×8): qty 3

## 2024-09-05 MED ORDER — IPRATROPIUM-ALBUTEROL 0.5-2.5 (3) MG/3ML IN SOLN
3.0000 mL | Freq: Once | RESPIRATORY_TRACT | Status: AC
  Administered 2024-09-05: 3 mL via RESPIRATORY_TRACT
  Filled 2024-09-05: qty 3

## 2024-09-05 MED ORDER — PANTOPRAZOLE SODIUM 40 MG PO TBEC
40.0000 mg | DELAYED_RELEASE_TABLET | Freq: Every day | ORAL | Status: DC
  Administered 2024-09-06 – 2024-09-13 (×8): 40 mg via ORAL
  Filled 2024-09-05 (×8): qty 1

## 2024-09-05 MED ORDER — MELATONIN 5 MG PO TABS
5.0000 mg | ORAL_TABLET | Freq: Every evening | ORAL | Status: DC | PRN
  Administered 2024-09-05 – 2024-09-11 (×2): 5 mg via ORAL
  Filled 2024-09-05 (×3): qty 1

## 2024-09-05 MED ORDER — ACETAMINOPHEN 325 MG PO TABS
650.0000 mg | ORAL_TABLET | Freq: Four times a day (QID) | ORAL | Status: DC | PRN
  Administered 2024-09-05: 650 mg via ORAL
  Filled 2024-09-05: qty 2

## 2024-09-05 MED ORDER — METHYLPREDNISOLONE SODIUM SUCC 125 MG IJ SOLR
125.0000 mg | Freq: Once | INTRAMUSCULAR | Status: AC
  Administered 2024-09-05: 125 mg via INTRAVENOUS
  Filled 2024-09-05: qty 2

## 2024-09-05 MED ORDER — ARFORMOTEROL TARTRATE 15 MCG/2ML IN NEBU
15.0000 ug | INHALATION_SOLUTION | Freq: Two times a day (BID) | RESPIRATORY_TRACT | Status: DC
  Administered 2024-09-05 – 2024-09-13 (×16): 15 ug via RESPIRATORY_TRACT
  Filled 2024-09-05 (×16): qty 2

## 2024-09-05 MED ORDER — IPRATROPIUM-ALBUTEROL 0.5-2.5 (3) MG/3ML IN SOLN
3.0000 mL | RESPIRATORY_TRACT | Status: DC
Start: 2024-09-05 — End: 2024-09-05
  Administered 2024-09-05 (×2): 3 mL via RESPIRATORY_TRACT
  Filled 2024-09-05 (×2): qty 3

## 2024-09-05 MED ORDER — TAMSULOSIN HCL 0.4 MG PO CAPS
0.4000 mg | ORAL_CAPSULE | Freq: Every day | ORAL | Status: DC
  Administered 2024-09-06 – 2024-09-13 (×8): 0.4 mg via ORAL
  Filled 2024-09-05 (×8): qty 1

## 2024-09-05 MED ORDER — INFLUENZA VAC SPLIT HIGH-DOSE 0.5 ML IM SUSY
0.5000 mL | PREFILLED_SYRINGE | INTRAMUSCULAR | Status: AC
Start: 2024-09-06 — End: 2024-09-06
  Administered 2024-09-06: 0.5 mL via INTRAMUSCULAR
  Filled 2024-09-05: qty 0.5

## 2024-09-05 MED ORDER — ENOXAPARIN SODIUM 40 MG/0.4ML IJ SOSY
40.0000 mg | PREFILLED_SYRINGE | INTRAMUSCULAR | Status: DC
  Administered 2024-09-06 – 2024-09-13 (×8): 40 mg via SUBCUTANEOUS
  Filled 2024-09-05 (×9): qty 0.4

## 2024-09-05 MED ORDER — SODIUM CHLORIDE 0.9 % IV SOLN
1.0000 g | Freq: Once | INTRAVENOUS | Status: AC
  Administered 2024-09-05: 1 g via INTRAVENOUS
  Filled 2024-09-05: qty 10

## 2024-09-05 MED ORDER — IOHEXOL 350 MG/ML SOLN
75.0000 mL | Freq: Once | INTRAVENOUS | Status: AC | PRN
  Administered 2024-09-05: 75 mL via INTRAVENOUS

## 2024-09-05 MED ORDER — METHYLPREDNISOLONE SODIUM SUCC 40 MG IJ SOLR
40.0000 mg | Freq: Two times a day (BID) | INTRAMUSCULAR | Status: DC
  Administered 2024-09-05 – 2024-09-07 (×4): 40 mg via INTRAVENOUS
  Filled 2024-09-05 (×4): qty 1

## 2024-09-05 MED ORDER — DEXTROSE 5 % IV SOLN
250.0000 mg | INTRAVENOUS | Status: DC
  Administered 2024-09-05 – 2024-09-06 (×2): 250 mg via INTRAVENOUS
  Filled 2024-09-05 (×2): qty 2.5

## 2024-09-05 MED ORDER — IPRATROPIUM-ALBUTEROL 0.5-2.5 (3) MG/3ML IN SOLN
3.0000 mL | RESPIRATORY_TRACT | Status: DC | PRN
  Administered 2024-09-12: 3 mL via RESPIRATORY_TRACT
  Filled 2024-09-05: qty 3

## 2024-09-05 MED ORDER — HYDRALAZINE HCL 25 MG PO TABS
25.0000 mg | ORAL_TABLET | Freq: Four times a day (QID) | ORAL | Status: DC | PRN
  Administered 2024-09-05: 25 mg via ORAL
  Filled 2024-09-05 (×2): qty 1

## 2024-09-05 MED ORDER — BUDESONIDE 0.5 MG/2ML IN SUSP
0.5000 mg | Freq: Two times a day (BID) | RESPIRATORY_TRACT | Status: DC
  Administered 2024-09-05 – 2024-09-13 (×16): 0.5 mg via RESPIRATORY_TRACT
  Filled 2024-09-05 (×16): qty 2

## 2024-09-05 MED ORDER — PROCHLORPERAZINE EDISYLATE 10 MG/2ML IJ SOLN: 5.0000 mg | Freq: Four times a day (QID) | INTRAMUSCULAR | Status: DC | PRN

## 2024-09-05 MED ORDER — FUROSEMIDE 10 MG/ML IJ SOLN
20.0000 mg | Freq: Once | INTRAMUSCULAR | Status: AC
  Administered 2024-09-06: 20 mg via INTRAVENOUS
  Filled 2024-09-05: qty 2

## 2024-09-05 MED ORDER — SIMETHICONE 40 MG/0.6ML PO SUSP
40.0000 mg | Freq: Four times a day (QID) | ORAL | Status: DC | PRN
  Administered 2024-09-05: 40 mg via ORAL
  Filled 2024-09-05 (×2): qty 0.6

## 2024-09-05 NOTE — Progress Notes (Signed)
 Pt was placed on BIPAP, following placement of BIPAP pt request to be taken off and put back on [place] 3 L.

## 2024-09-05 NOTE — H&P (Signed)
 History and Physical    PatientTarique Loveall [name] FMW:[id number] DOB: August 27, 1951 DOA: 09/04/2024 DOS: the patient was seen and examined on 09/05/2024 PCP: [facility]  Patient coming from: Home.  Lives with family.  Chief Complaint:  Chief Complaint  Patient presents with   Abdominal Pain   Shortness of Breath   HPI: [name] [name] is a 73 y.o. male with PMH of COPD/chronic hypoxic hypercapnic RF on 2 to 3 L and BiPAP but not compliant with BiPAP, PHTN, lung cancer s/p left pneumonectomy, BPH, GERD and HTN presenting with shortness of breath for about a week with dry cough and abdominal pain.  Patient is sleepy but wakes to voice.  He is oriented x 4.   Reports difficulty breathing for several weeks.  He also reports some dry cough.  Not compliant with BiPAP.  Uses his nebulizer but not consistently.  Also not sure how much he is getting out of it since he mostly tends to fall asleep before he even tries to put it on.  He is followed by pulmonology and missed his last appointment 4 days ago.  Denies chest pain, fever, runny nose, sore throat, nausea, vomiting.  Abdominal pain is central and diffuse.  Not able to describe.  Pain is not severe.  He denies diarrhea or constipation.  Denies UTI symptoms but chronic urinary hesitancy likely from BPH.  He denies burning with urination, frequency or urgency.    Patient denies smoking cigarettes, drinking alcohol or recreational drug use.  He is not interested in cardiopulmonary resuscitation in an event of sudden cardiopulmonary arrest.  He states I do not want people pumping on my chest.  In ED, stable vitals.  VBG suggests chronic respiratory acidosis.  BMP bicarb 42.  Hgb 10.5.  D-dimer 2.0.  CXR and CT angio chest, abdomen and pelvis without acute finding.  UA with many bacteria.  BNP within normal.  Received Solu-Medrol , ceftriaxone , Zithromax , nebulizers.  Admission accepted by overnight admitted.    Review of Systems: As mentioned in  the history of present illness. All other systems reviewed and are negative. Past Medical History:  Diagnosis Date   Cancer (HCC)    lung   CHF (congestive heart failure) (HCC)    Diabetes mellitus without complication (HCC)    Dyspnea    Hyperlipidemia    Hypertension    Past Surgical History:  Procedure Laterality Date   BALLOON DILATION N/A 08/26/2021   Procedure: BALLOON DILATION;  Surgeon: Charlanne Groom, MD;  Location: [facility] ENDOSCOPY;  Service: Endoscopy;  Laterality: N/A;   COLONOSCOPY WITH PROPOFOL  N/A 04/20/2021   Procedure: COLONOSCOPY WITH PROPOFOL ;  Surgeon: Toledo, Ladell POUR, MD;  Location: [facility] ENDOSCOPY;  Service: Gastroenterology;  Laterality: N/A;   ESOPHAGOGASTRODUODENOSCOPY N/A 04/20/2021   Procedure: ESOPHAGOGASTRODUODENOSCOPY (EGD);  Surgeon: Toledo, Ladell POUR, MD;  Location: [facility] ENDOSCOPY;  Service: Gastroenterology;  Laterality: N/A;   ESOPHAGOGASTRODUODENOSCOPY (EGD) WITH PROPOFOL  N/A 08/26/2021   Procedure: ESOPHAGOGASTRODUODENOSCOPY (EGD) WITH PROPOFOL ;  Surgeon: Charlanne Groom, MD;  Location: [facility] ENDOSCOPY;  Service: Endoscopy;  Laterality: N/A;   LUNG REMOVAL, PARTIAL     RIGHT HEART CATH N/A 08/24/2021   Procedure: RIGHT HEART CATH;  Surgeon: Cherrie Toribio SAUNDERS, MD;  Location: [facility] INVASIVE CV LAB;  Service: Cardiovascular;  Laterality: N/A;   Social History:  reports that he has quit smoking. He has never used smokeless tobacco. He reports current alcohol use. He reports that he does not currently use drugs.  Allergies  Allergen Reactions  Morphine  Other (See Comments)    Very sensitive d/t resp changes that cause hypercarbia    No family history on file.  Prior to Admission medications   Medication Sig Start Date End Date Taking? Authorizing Provider  acetaminophen  (TYLENOL ) 325 MG tablet Take 650 mg by mouth every 6 (six) hours as needed for headache or mild pain.   Yes [provider]  albuterol  (VENTOLIN  HFA) 108 (90 Base) MCG/ACT inhaler Inhale 1-2  puffs into the lungs every 6 (six) hours as needed. 04/04/21  Yes [provider]  arformoterol  (BROVANA ) 15 MCG/2ML NEBU Inhale 15 mcg into the lungs 2 (two) times daily. 05/14/24  Yes [provider]  budesonide  (PULMICORT ) 0.5 MG/2ML nebulizer solution Inhale 0.5 mg into the lungs daily. 05/14/24  Yes [provider]  carboxymethylcellulose 1 % ophthalmic solution Apply 1 drop to eye as needed. For dry eyes   Yes [provider]  carvedilol  (COREG ) 3.125 MG tablet Take 1 tablet (3.125 mg total) by mouth 2 (two) times daily. 09/16/21  Yes Fairy Frames, MD  cholecalciferol  (VITAMIN D3) 25 MCG (1000 UNIT) tablet Take 1,000 Units by mouth daily.   Yes [provider]  feeding supplement (ENSURE PLUS HIGH PROTEIN) LIQD Take 237 mLs by mouth 3 (three) times daily between meals. 05/30/24  Yes Patel, Sona, MD  furosemide  (LASIX ) 40 MG tablet Take 40 mg by mouth daily as needed for fluid or edema.   Yes [provider]  lisinopril -hydrochlorothiazide (ZESTORETIC) 20-12.5 MG tablet Take 1 tablet by mouth daily.   Yes [provider]  loratadine (CLARITIN) 10 MG tablet Take 10 mg by mouth daily. For allegies   Yes [provider]  Multiple Vitamin (MULTIVITAMIN) capsule Take 1 capsule by mouth daily.   Yes [provider]  omeprazole  (PRILOSEC) 20 MG capsule Take 1 capsule (20 mg total) by mouth 2 (two) times daily before a meal. 05/30/24  Yes Tobie Calix, MD  revefenacin  (YUPELRI ) 175 MCG/3ML nebulizer solution Take 175 mcg by nebulization daily. 05/14/24  Yes [provider]  simvastatin  (ZOCOR ) 20 MG tablet Take 10 mg by mouth daily.   Yes [provider]  tadalafil (CIALIS) 10 MG tablet Take 10 mg by mouth daily as needed. 10/19/20  Yes [provider]  Cholecalciferol  10 MCG (400 UNIT) CAPS Take 400 Units by mouth daily.    [provider]  Ensifentrine  (OHTUVAYRE ) 3 MG/2.5ML SUSP Inhale 3 mg  into the lungs 2 (two) times daily. 01/08/24   Kassie Acquanetta Bradley, MD  lisinopril  (ZESTRIL ) 10 MG tablet Take 5 mg by mouth daily. 01/15/24   [provider]  predniSONE  (DELTASONE ) 20 MG tablet Take 1 tablet (20 mg total) by mouth daily with breakfast. 06/16/24   Jude Harden GAILS, MD    Physical Exam: Vitals:   09/05/24 0730 09/05/24 0745 09/05/24 0800 09/05/24 0815  BP: (!) 152/90 (!) 134/93 (!) 151/85 (!) 155/82  Pulse: 99 98 97 95  Resp: (!) 25 (!) 21 (!) 23 20  Temp:    97.9 F (36.6 C)  TempSrc:    Oral  SpO2: 100% 100% 100% 100%  Weight:      Height:       GENERAL: Cachectic and frail. HEENT: MMM.  Vision and hearing grossly intact.  NECK: Supple.  No apparent JVD.  RESP: Some work of breathing.  Diminished aeration. CVS:  RRR. Heart sounds normal.  ABD/GI/GU: BS+. Abd soft, NTND.  MSK/EXT:   No apparent deformity.  Significant muscle mass and subcu fat loss. SKIN: no apparent skin lesion or wound NEURO: Sleepy but wakes to voice.  Oriented x 4.  No apparent focal neuro deficit. PSYCH: Calm. Normal affect.  Data Reviewed: See HPI  Assessment and Plan: COPD with acute exacerbation-likely due to noncompliance with BiPAP and medication Chronic hypoxic and hypercapnic respiratory failure-on BiPAP on 2 to 3 L by [place]. History of lung cancer s/p left pneumonectomy -Presents with weeks of shortness of breath with dry cough.  Noncompliant with BiPAP.  VBG with chronic respiratory acidosis.  Seems to have intermittent jerking movements in extremities likely from narcosis.  He was saturating at 100% on 3 L. -Weaned oxygen to 1 L and sats remained above 90%.  Minimum oxygen to keep saturation above 88% -Per RT, refusing DuoNeb and asking for home nebulizers. Ordered Brovana , Pulmicort  and Yupelri  and changed DuoNebs to as needed. -Continue Solu-Medrol , Zithromax .  -BiPAP as needed. -DNR/DNI. -PCCM consulted.  Pulmonary hypertension - Resume home meds after med  rec.  Essential hypertension: BP slightly elevated. - Resume home meds after med rec as appropriate - P.o. hydralazine  as needed  Elevated D-dimer-CT angio chest negative for PE.  Doubt acute DVT.  BPH with urinary hesitancy - Start Flomax .  Bacteriuria/possible UTI: Patient has chronic urinary hesitancy.  Denies dysuria.  Received CTX. - Follow urine culture  Abdominal pain: Symptoms are resolved.  Abdominal exam benign.  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    Advance Care Planning:   Code Status: Limited: Do not attempt resuscitation (DNR) -DNR-LIMITED -Do Not Intubate/DNI  -discussed with patient.  Consults: Pulmonology/PCCM  Family Communication: Granddaughter at bedside.  Patient's daughter was on the phone with granddaughter during this encounter  Severity of Illness: The appropriate patient status for this patient is INPATIENT. Inpatient status is judged to be reasonable and necessary in order to provide the required intensity of service to ensure the patient's safety. The patient's presenting symptoms, physical exam findings, and initial radiographic and laboratory data in the context of their chronic comorbidities is felt to place them at high risk for further clinical deterioration. Furthermore, it is not anticipated that the patient will be medically stable for discharge from the hospital within 2 midnights of admission.   * I certify that at the point of admission it is my clinical judgment that the patient will require inpatient hospital care spanning beyond 2 midnights from the point of admission due to high intensity of service, high risk for further deterioration and high frequency of surveillance required.*  Author: Mignon ONEIDA Bump, MD 09/05/2024 9:02 AM  For on call review [email or web address].

## 2024-09-05 NOTE — ED Provider Notes (Signed)
 [place] EMERGENCY DEPARTMENT AT [facility] Provider Note   CSN: [id number] Arrival date & time: 09/04/24  2011     Patient presents with: Abdominal Pain and Shortness of Breath   [name] [name] is a 73 y.o. male.   The history is provided by the patient.  Abdominal Pain Associated symptoms: shortness of breath   Shortness of Breath Associated symptoms: abdominal pain   [name] [name] is a 73 y.o. male who presents to the Emergency Department complaining of shortness of breath. He presents the emergency department accompanied by his granddaughter for evaluation of difficulty breathing that started several weeks ago. He does have two breathing devices at home but does not feel like he is using them properly. He states that he often falls asleep before he is able to put it on and when he does put it on he feels like he is inhaling too hard and is uncomfortable to breathe. No associated chest pain. He does have a nonproductive cough. He also reports bilateral lower extremity edema for the last six months. He also describes abdominal pain that is been in the central abdomen and has been ongoing for an unclear amount of time. He reports difficulty urinating but no dysuria. He is on 2 to 3 L oxygen at baseline. He is followed by pulmonary as well as the [place].       Prior to Admission medications   Medication Sig Start Date End Date Taking? Authorizing Provider  acetaminophen  (TYLENOL ) 325 MG tablet Take 650 mg by mouth every 6 (six) hours as needed for headache or mild pain.    [provider]  albuterol  (VENTOLIN  HFA) 108 (90 Base) MCG/ACT inhaler Inhale 1-2 puffs into the lungs every 6 (six) hours as needed. 04/04/21   [provider]  arformoterol  (BROVANA ) 15 MCG/2ML NEBU Inhale 15 mcg into the lungs 2 (two) times daily. 05/14/24   [provider]  budesonide  (PULMICORT ) 0.5 MG/2ML nebulizer solution Inhale 0.5 mg into the lungs daily. 05/14/24   [provider]  carboxymethylcellulose 1 % ophthalmic solution Apply 1 drop to eye as needed. For dry eyes    [provider]  carvedilol  (COREG ) 3.125 MG tablet Take 1 tablet (3.125 mg total) by mouth 2 (two) times daily. 09/16/21   Fairy Frames, MD  Cholecalciferol  10 MCG (400 UNIT) CAPS Take 400 Units by mouth daily.    [provider]  Ensifentrine  (OHTUVAYRE ) 3 MG/2.5ML SUSP Inhale 3 mg into the lungs 2 (two) times daily. 01/08/24   Kassie Acquanetta Bradley, MD  feeding supplement (ENSURE PLUS HIGH PROTEIN) LIQD Take 237 mLs by mouth 3 (three) times daily between meals. 05/30/24   Patel, Sona, MD  lisinopril  (ZESTRIL ) 10 MG tablet Take 5 mg by mouth daily. 01/15/24   [provider]  loratadine (CLARITIN) 10 MG tablet Take 10 mg by mouth daily. For allegies    [provider]  Multiple Vitamin (MULTIVITAMIN) capsule Take 1 capsule by mouth daily.    [provider]  omeprazole  (PRILOSEC) 20 MG capsule Take 1 capsule (20 mg total) by mouth 2 (two) times daily before a meal. 05/30/24   Tobie Calix, MD  predniSONE  (DELTASONE ) 20 MG tablet Take 1 tablet (20 mg total) by mouth daily with breakfast. 06/16/24   Jude Harden GAILS, MD  revefenacin  (YUPELRI ) 175 MCG/3ML nebulizer solution Take 175 mcg by nebulization daily. 05/14/24   [provider]  simvastatin  (ZOCOR ) 20 MG tablet Take 10 mg by mouth  daily.    [provider]  tadalafil (CIALIS) 10 MG tablet Take 10 mg by mouth daily as needed. 10/19/20   [provider]    Allergies: Morphine     Review of Systems  Respiratory:  Positive for shortness of breath.   Gastrointestinal:  Positive for abdominal pain.  All other systems reviewed and are negative.   Updated Vital Signs BP (!) 162/74   Pulse 66   Temp 97.9 F (36.6 C)   Resp 18   Ht 5' 9 (1.753 m)   Wt 55.3 kg   SpO2 100%   BMI 18.02 kg/m   Physical Exam Vitals and nursing note reviewed.  Constitutional:       Appearance: He is well-developed.  HENT:     Head: Normocephalic and atraumatic.  Cardiovascular:     Rate and Rhythm: Normal rate and regular rhythm.     Heart sounds: No murmur heard. Pulmonary:     Effort: Pulmonary effort is normal. No respiratory distress.     Comments: Decreased air movement bilaterally Abdominal:     Palpations: Abdomen is soft.     Tenderness: There is no abdominal tenderness. There is no guarding or rebound.  Musculoskeletal:     Comments: 2+ DP pulses bilaterally.  Pitting edema to BLE  Skin:    General: Skin is warm and dry.  Neurological:     Mental Status: He is alert and oriented to [place], place, and time.  Psychiatric:        Behavior: Behavior normal.     (all labs ordered are listed, but only abnormal results are displayed) Labs Reviewed  BASIC METABOLIC PANEL WITH GFR - Abnormal; Notable for the following components:      Result Value   Chloride 86 (*)    CO2 42 (*)    All other components within normal limits  CBC - Abnormal; Notable for the following components:   RBC 3.70 (*)    Hemoglobin 10.5 (*)    HCT 36.5 (*)    MCHC 28.8 (*)    Platelets 139 (*)    All other components within normal limits  URINALYSIS, ROUTINE W REFLEX MICROSCOPIC - Abnormal; Notable for the following components:   APPearance HAZY (*)    Protein, ur 30 (*)    Leukocytes,Ua MODERATE (*)    Bacteria, UA MANY (*)    All other components within normal limits  I-STAT CHEM 8, ED - Abnormal; Notable for the following components:   Chloride 85 (*)    TCO2 46 (*)    Hemoglobin 12.2 (*)    HCT 36.0 (*)    All other components within normal limits    EKG: EKG Interpretation Date/Time:  Thursday September 04 2024 20:54:20 EDT Ventricular Rate:  90 PR Interval:  136 QRS Duration:  78 QT Interval:  320 QTC Calculation: 391 R Axis:   60  Text Interpretation: Normal sinus rhythm Minimal voltage criteria for LVH, may be normal variant ( Cornell product )  Nonspecific T wave abnormality Abnormal ECG Confirmed by Griselda Norris [phone]) on 09/05/2024 1:06:58 AM  Radiology: [facility] Chest 2 View Result Date: 09/04/2024 CLINICAL DATA:  sob EXAM: CHEST - 2 VIEW COMPARISON:  CT chest 05/27/2024, chest x-ray 05/27/2024 FINDINGS: The heart and mediastinal contours are not well visualized due to overlying left lung disease. Associated persistent left to right midline shift. Redemonstration of complete opacification of the left hemithorax in the setting of known left pneumonectomy. Left pleural calcifications again  noted. No focal consolidation. Coarsened interstitial markings with no overt pulmonary edema. Nonspecific blunting of the right costophrenic angle. No pneumothorax. No acute osseous abnormality. IMPRESSION: No acute findings within the right lung. Left pneumonectomy. Electronically Signed   By: Morgane  Naveau M.D.   On: 09/04/2024 21:15     Procedures   Medications Ordered in the ED - No data to display                                  Medical Decision Making Amount and/or Complexity of Data Reviewed Labs: ordered. Radiology: ordered.  Risk Prescription drug management. Decision regarding hospitalization.   Patient with history of COPD on chronic nasal cannula oxygen here for evaluation of increase shortness of breath. He is diminished on examination. He is very symptomatic although his stats are stable. Chest x-ray without acute changes. He does have significant lower extremity edema on evaluation that is asymmetric. D dimer was obtained, which was elevated. A CTA was obtained, which is negative for PE. He was treated with Solu-Medrol , duo nebs for COPD exacerbation with no significant change in his dyspnea. He was also provided with furosemide  for diuresis given his edema and Rocephin  for his UTI. Given patient's severe symptomatology medicine consulted for admission, will likely need pulmonary consult for additional medication adjustments in  management.     Final diagnoses:  COPD exacerbation (HCC)  Acute UTI    ED Discharge Orders     None          Griselda Norris, MD 09/05/24 [phone]

## 2024-09-05 NOTE — ED Notes (Signed)
 Patient transported to CT

## 2024-09-05 NOTE — Consult Note (Signed)
 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   In ED, stable vitals.  VBG 7.45, pCO 73, pO2 42.  BMP bicarb 42.  Hgb 10.5.  D-dimer 2.0.  CXR and CT angio chest, abdomen and pelvis without acute finding.  UA with many bacteria.  BNP within normal.  Received Solu-Medrol , ceftriaxone , Zithromax , nebulizers.   Patient reports increasing shortness of breath and abdominal pain over recent days.   He is feeling a bit better since admission.  Pertinent  Medical History   Past Medical History:  Diagnosis Date   Cancer (HCC)    lung   CHF (congestive heart failure) (HCC)    Diabetes mellitus without complication (HCC)    Dyspnea    Hyperlipidemia    Hypertension    Significant Hospital Events: Including procedures, antibiotic start and stop dates in addition to other pertinent events   9/18 admitted 9/19 PCCM consulted  Interim History / Subjective:  As above  Objective    Blood pressure (!) 155/82, pulse 95, temperature 97.9 F (36.6 C), temperature source Oral, resp. rate 20, height 5' 9 (1.753 m), weight 55.3 kg, SpO2 100%.    FiO2 (%):  [32 %] 32 %  No intake or output data in the 24 hours ending 09/05/24 1210 Filed Weights   09/04/24 2047  Weight: 55.3 kg    Examination: General: elderly male, frail appearing, no distress HENT: [place]/AT, moist mucous membranes Lungs: diminished breath sounds Cardiovascular: rrr Abdomen: soft, non-tender, non-distended Extremities: warm, 2+ edema Neuro: alert, oriented GU: n/a  CTA Chest/CT Abd/Pelvis CTA of the chest: No evidence of pulmonary  emboli. Atherosclerotic calcifications and emphysematous change. CT abdomen and pelvis: Chronic changes without acute abnormality.  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 COPD with Exacerbation History of Lung Cancer s/p Left Pneumonectomy Pulmonary Hypertension Thrombocytopenia  Plan: - Continue IV solumedrol twice daily - Continue ceftriaxone  and azithromycin  - Continue brovana  and yupelri  nebs - goal Spo2 88-92% - reduced oxygen to 2L from 3L, avoid over oxygenation which can lead to CO2 retention - Check TSH - Diuresis as able  PCCM will continue to follow   Labs   CBC: Recent Labs  Lab 09/04/24 2103 09/05/24 0152 09/05/24 0504  WBC 5.0  --  6.1  HGB 10.5*  12.2* 11.9* 10.3*  HCT 36.5*  36.0* 35.0* 35.1*  MCV 98.6  --  98.6  PLT 139*  --  148*    Basic Metabolic Panel: Recent Labs  Lab 09/04/24 2103 09/05/24 0152 09/05/24 0504  NA 139  137 136  --   K 4.5  4.5 4.5  --   CL 86*  85*  --   --   CO2 42*  --   --   GLUCOSE 83  83  --   --   BUN 16  19  --   --   CREATININE 0.64  0.90  --  0.65  CALCIUM 9.4  --   --    GFR: Estimated Creatinine Clearance: 67.2 mL/min (by C-G formula based on  SCr of 0.65 mg/dL). Recent Labs  Lab 09/04/24 2103 09/05/24 0504  WBC 5.0 6.1    Liver Function Tests: Recent Labs  Lab 09/05/24 0147  AST 25  ALT 19  ALKPHOS 62  BILITOT 0.2  PROT 6.6  ALBUMIN 3.7   No results for input(s): LIPASE, AMYLASE in the last 168 hours. No results for input(s): AMMONIA in the last 168 hours.  ABG    Component Value Date/Time   PHART 7.4 05/28/2024 0017   PCO2ART 93 (HH) 05/28/2024 0017   PO2ART 63 (L) 05/28/2024 0017   HCO3 51.5 (H) 09/05/2024 0152   TCO2 >50 (H) 09/05/2024 0152   O2SAT 76 09/05/2024 0152     Coagulation Profile: No results for input(s): INR, PROTIME in the last 168 hours.  Cardiac Enzymes: No results for  input(s): CKTOTAL, CKMB, CKMBINDEX, TROPONINI in the last 168 hours.  HbA1C: Hgb A1c MFr Bld  Date/Time Value Ref Range Status  08/23/2021 09:38 AM 5.5 4.8 - 5.6 % Final    Comment:    (NOTE) Pre diabetes:          5.7%-6.4%  Diabetes:              >6.4%  Glycemic control for   <7.0% adults with diabetes     CBG: No results for input(s): GLUCAP in the last 168 hours.  Review of Systems:   Review of Systems  Constitutional:  Negative for chills, fever, malaise/fatigue and weight loss.  HENT:  Negative for congestion, sinus pain and sore throat.   Eyes: Negative.   Respiratory:  Positive for shortness of breath and wheezing. Negative for cough, hemoptysis and sputum production.   Cardiovascular:  Negative for chest pain, palpitations, orthopnea, claudication and leg swelling.  Gastrointestinal:  Positive for abdominal pain and constipation. Negative for heartburn, nausea and vomiting.  Genitourinary: Negative.   Musculoskeletal:  Negative for joint pain and myalgias.  Skin:  Negative for rash.  Neurological:  Negative for weakness.  Endo/Heme/Allergies: Negative.   Psychiatric/Behavioral: Negative.     Past Medical History:  He,  has a past medical history of Cancer (HCC), CHF (congestive heart failure) (HCC), Diabetes mellitus without complication (HCC), Dyspnea, Hyperlipidemia, and Hypertension.   Surgical History:   Past Surgical History:  Procedure Laterality Date   BALLOON DILATION N/A 08/26/2021   Procedure: BALLOON DILATION;  Surgeon: Charlanne Groom, MD;  Location: [facility] ENDOSCOPY;  Service: Endoscopy;  Laterality: N/A;   COLONOSCOPY WITH PROPOFOL  N/A 04/20/2021   Procedure: COLONOSCOPY WITH PROPOFOL ;  Surgeon: Toledo, Ladell POUR, MD;  Location: [facility] ENDOSCOPY;  Service: Gastroenterology;  Laterality: N/A;   ESOPHAGOGASTRODUODENOSCOPY N/A 04/20/2021   Procedure: ESOPHAGOGASTRODUODENOSCOPY (EGD);  Surgeon: Toledo, Ladell POUR, MD;  Location: [facility] ENDOSCOPY;  Service:  Gastroenterology;  Laterality: N/A;   ESOPHAGOGASTRODUODENOSCOPY (EGD) WITH PROPOFOL  N/A 08/26/2021   Procedure: ESOPHAGOGASTRODUODENOSCOPY (EGD) WITH PROPOFOL ;  Surgeon: Charlanne Groom, MD;  Location: [facility] ENDOSCOPY;  Service: Endoscopy;  Laterality: N/A;   LUNG REMOVAL, PARTIAL     RIGHT HEART CATH N/A 08/24/2021   Procedure: RIGHT HEART CATH;  Surgeon: Cherrie Toribio SAUNDERS, MD;  Location: [facility] INVASIVE CV LAB;  Service: Cardiovascular;  Laterality: N/A;     Social History:   reports that he has quit smoking. He has never used smokeless tobacco. He reports current alcohol use. He reports that he does not currently use drugs.   Family History:  His family history is not on file.   Allergies Allergies  Allergen Reactions   Morphine  Other (  See Comments)    Very sensitive d/t resp changes that cause hypercarbia     Home Medications  Prior to Admission medications   Medication Sig Start Date End Date Taking? Authorizing Provider  acetaminophen  (TYLENOL ) 325 MG tablet Take 650 mg by mouth every 6 (six) hours as needed for headache or mild pain.   Yes [provider]  albuterol  (VENTOLIN  HFA) 108 (90 Base) MCG/ACT inhaler Inhale 1-2 puffs into the lungs every 6 (six) hours as needed. 04/04/21  Yes [provider]  arformoterol  (BROVANA ) 15 MCG/2ML NEBU Inhale 15 mcg into the lungs 2 (two) times daily. 05/14/24  Yes [provider]  budesonide  (PULMICORT ) 0.5 MG/2ML nebulizer solution Inhale 0.5 mg into the lungs daily. 05/14/24  Yes [provider]  carboxymethylcellulose 1 % ophthalmic solution Apply 1 drop to eye as needed. For dry eyes   Yes [provider]  carvedilol  (COREG ) 3.125 MG tablet Take 1 tablet (3.125 mg total) by mouth 2 (two) times daily. 09/16/21  Yes Fairy Frames, MD  cholecalciferol  (VITAMIN D3) 25 MCG (1000 UNIT) tablet Take 1,000 Units by mouth daily.   Yes [provider]  feeding supplement (ENSURE PLUS HIGH PROTEIN) LIQD  Take 237 mLs by mouth 3 (three) times daily between meals. 05/30/24  Yes Patel, Sona, MD  furosemide  (LASIX ) 40 MG tablet Take 40 mg by mouth daily as needed for fluid or edema.   Yes [provider]  lisinopril -hydrochlorothiazide (ZESTORETIC) 20-12.5 MG tablet Take 1 tablet by mouth daily.   Yes [provider]  loratadine (CLARITIN) 10 MG tablet Take 10 mg by mouth daily. For allegies   Yes [provider]  Multiple Vitamin (MULTIVITAMIN) capsule Take 1 capsule by mouth daily.   Yes [provider]  omeprazole  (PRILOSEC) 20 MG capsule Take 1 capsule (20 mg total) by mouth 2 (two) times daily before a meal. 05/30/24  Yes Tobie Calix, MD  revefenacin  (YUPELRI ) 175 MCG/3ML nebulizer solution Take 175 mcg by nebulization daily. 05/14/24  Yes [provider]  simvastatin  (ZOCOR ) 20 MG tablet Take 10 mg by mouth daily.   Yes [provider]  tadalafil (CIALIS) 10 MG tablet Take 10 mg by mouth daily as needed. 10/19/20  Yes [provider]  Cholecalciferol  10 MCG (400 UNIT) CAPS Take 400 Units by mouth daily.    [provider]  Ensifentrine  (OHTUVAYRE ) 3 MG/2.5ML SUSP Inhale 3 mg into the lungs 2 (two) times daily. 01/08/24   Kassie Acquanetta Bradley, MD  lisinopril  (ZESTRIL ) 10 MG tablet Take 5 mg by mouth daily. 01/15/24   [provider]  predniSONE  (DELTASONE ) 20 MG tablet Take 1 tablet (20 mg total) by mouth daily with breakfast. 06/16/24   Jude Harden GAILS, MD     Critical care time: n/a    Dorn Chill, MD [facility] Office: [phone]   See Amion for personal pager PCCM on call pager [phone] until 7pm. Please call Elink 7p-7a. [phone]

## 2024-09-06 DIAGNOSIS — J9622 Acute and chronic respiratory failure with hypercapnia: Secondary | ICD-10-CM | POA: Diagnosis not present

## 2024-09-06 DIAGNOSIS — J441 Chronic obstructive pulmonary disease with (acute) exacerbation: Secondary | ICD-10-CM | POA: Diagnosis not present

## 2024-09-06 LAB — RENAL FUNCTION PANEL
Albumin: 3.4 g/dL — ABNORMAL LOW (ref 3.5–5.0)
Anion gap: 11 (ref 5–15)
BUN: 15 mg/dL (ref 8–23)
CO2: 41 mmol/L — ABNORMAL HIGH (ref 22–32)
Calcium: 9.4 mg/dL (ref 8.9–10.3)
Chloride: 85 mmol/L — ABNORMAL LOW (ref 98–111)
Creatinine, Ser: 0.69 mg/dL (ref 0.61–1.24)
GFR, Estimated: >60 mL/min (ref >60)
Glucose, Bld: 109 mg/dL — ABNORMAL HIGH (ref 70–99)
Phosphorus: 3.6 mg/dL (ref 2.5–4.6)
Potassium: 4.6 mmol/L (ref 3.5–5.1)
Sodium: 137 mmol/L (ref 135–145)

## 2024-09-06 LAB — CBC
HCT: 32.9 % — ABNORMAL LOW (ref 39.0–52.0)
Hemoglobin: 10.1 g/dL — ABNORMAL LOW (ref 13.0–17.0)
MCH: 28.6 pg (ref 26.0–34.0)
MCHC: 30.7 g/dL (ref 30.0–36.0)
MCV: 93.2 fL (ref 80.0–100.0)
Platelets: 146 K/uL — ABNORMAL LOW (ref 150–400)
RBC: 3.53 MIL/uL — ABNORMAL LOW (ref 4.22–5.81)
RDW: 12.1 % (ref 11.5–15.5)
WBC: 3.7 K/uL — ABNORMAL LOW (ref 4.0–10.5)
nRBC: 0 % (ref 0.0–0.2)

## 2024-09-06 LAB — MAGNESIUM: Magnesium: 1.9 mg/dL (ref 1.7–2.4)

## 2024-09-06 LAB — TSH: TSH: 1.474 u[IU]/mL (ref 0.350–4.500)

## 2024-09-06 MED ORDER — CARVEDILOL 3.125 MG PO TABS
3.1250 mg | ORAL_TABLET | Freq: Two times a day (BID) | ORAL | Status: DC
  Administered 2024-09-06 – 2024-09-13 (×14): 3.125 mg via ORAL
  Filled 2024-09-06 (×15): qty 1

## 2024-09-06 MED ORDER — POLYVINYL ALCOHOL 1.4 % OP SOLN: 1.0000 [drp] | Freq: Two times a day (BID) | OPHTHALMIC | Status: DC | PRN

## 2024-09-06 MED ORDER — AZITHROMYCIN 500 MG PO TABS
250.0000 mg | ORAL_TABLET | Freq: Every day | ORAL | Status: AC
  Administered 2024-09-07 – 2024-09-13 (×7): 250 mg via ORAL
  Filled 2024-09-06 (×7): qty 1

## 2024-09-06 MED ORDER — FUROSEMIDE 10 MG/ML IJ SOLN
20.0000 mg | Freq: Every day | INTRAMUSCULAR | Status: DC
  Administered 2024-09-06 – 2024-09-07 (×2): 20 mg via INTRAVENOUS
  Filled 2024-09-06: qty 2

## 2024-09-06 MED ORDER — ENSURE PLUS HIGH PROTEIN PO LIQD
237.0000 mL | Freq: Two times a day (BID) | ORAL | Status: DC
  Administered 2024-09-06 – 2024-09-13 (×10): 237 mL via ORAL

## 2024-09-06 NOTE — Progress Notes (Signed)
 PROGRESS NOTE    [name] [name]  FMW:[id number] DOB: 1951/10/19 DOA: 09/04/2024 PCP: [facility]   Chief Complaint  Patient presents with   Abdominal Pain   Shortness of Breath    Brief Narrative:    [name] [name] is a 73 y.o. male with PMH of COPD/chronic hypoxic hypercapnic RF on 2 to 3 L and BiPAP but not compliant with BiPAP, PHTN, lung cancer s/p left pneumonectomy, BPH, GERD and HTN presenting with shortness of breath for about a week with dry cough and abdominal pain.   Patient is sleepy but wakes to voice.  He is oriented x 4.   Reports difficulty breathing for several weeks.  He also reports some dry cough.  Not compliant with BiPAP.  Uses his nebulizer but not consistently.  Also not sure how much he is getting out of it since he mostly tends to fall asleep before he even tries to put it on.  He is followed by pulmonology and missed his last appointment 4 days ago.  Denies chest pain, fever, runny nose, sore throat, nausea, vomiting.  Abdominal pain is central and diffuse.  Not able to describe.  Pain is not severe.  He denies diarrhea or constipation.  Denies UTI symptoms but chronic urinary hesitancy likely from BPH.  He denies burning with urination, frequency or urgency.     Patient denies smoking cigarettes, drinking alcohol  or recreational drug use.  He is not interested in cardiopulmonary resuscitation in an event of sudden cardiopulmonary arrest.  He states I do not want people pumping on my chest.   In ED, stable vitals.  VBG suggests chronic respiratory acidosis.  BMP bicarb 42.  Hgb 10.5.  D-dimer 2.0.  CXR and CT angio chest, abdomen and pelvis without acute finding.  UA with many bacteria.  BNP within normal.  Received Solu-Medrol , ceftriaxone , Zithromax , nebulizers.    Assessment & Plan:   Principal Problem:   COPD exacerbation (HCC) Active Problems:   COPD (chronic obstructive pulmonary disease) (HCC)   COPD with acute exacerbation Chronic  hypoxic and hypercapnic respiratory failure History of lung cancer s/p left pneumonectomy -Presents with dyspnea, increased work of breathing, cough. -Compliant with BiPAP. -Workup significant for hypercapnia, but appears with chronic respiratory acidosis -Improved with BiPAP. -Given cough with phlegm, continue with IV antibiotics -Continue with IV steroids -Per RT, refusing DuoNeb and asking for home nebulizers. Ordered Brovana , Pulmicort  and Yupelri  and changed DuoNebs to as needed. -Continue Solu-Medrol , Zithromax .  -BiPAP as needed. -DNR/DNI. -PCCM input greatly appreciated -Start low-dose Lasix   Pulmonary hypertension - Resume home meds after med rec.   Essential hypertension:  - Resume home meds after med rec as appropriate - P.o. hydralazine  as needed   Elevated D-dimer -CT angio chest negative for PE.     BPH with urinary hesitancy - Start Flomax .   Bacteriuria/possible UTI: Patient has chronic urinary hesitancy.  Denies dysuria.  Received CTX. - Follow urine culture   Abdominal pain: Symptoms are resolved.  Abdominal exam benign.  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  Started on Ensure    DVT prophylaxis: (Lovenox ) Code Status: (DNR) Family Communication: Discussed with patient Disposition:   Status is: Inpatient    Consultants:  PCCM  Subjective: Reports he is feeling better today, but still reports dyspnea, cough and weakness  Objective: Vitals:   09/06/24 0400 09/06/24 0800 09/06/24 0803 09/06/24 0805  BP:  122/88 (!) 129/94    Pulse: 80 86    Resp: 19 17    Temp: 97.6 F (36.4 C) 98 F (36.7 C)    TempSrc: Axillary Axillary    SpO2: 97% 100% 100% 100%  Weight:      Height:        Intake/Output Summary (Last 24 hours) at 09/06/2024 1307 Last data filed at 09/06/2024 0541 Gross per 24 hour  Intake --  Output 1100 ml  Net -1100 ml    Filed Weights   09/04/24 2047  Weight: 55.3 kg    Examination:  Awake Alert, Oriented X 3, well, chronically ill appearing Symmetrical Chest wall movement, scattered Rales RRR,No Gallops,Rubs or new Murmurs, No Parasternal Heave +ve B.Sounds, Abd Soft, No tenderness, No rebound - guarding or rigidity. No Cyanosis, Clubbing, +1 edema     Data Reviewed: I have personally reviewed following labs and imaging studies  CBC: Recent Labs  Lab 09/04/24 2103 09/05/24 0152 09/05/24 0504 09/06/24 0427  WBC 5.0  --  6.1 3.7*  HGB 10.5*  12.2* 11.9* 10.3* 10.1*  HCT 36.5*  36.0* 35.0* 35.1* 32.9*  MCV 98.6  --  98.6 93.2  PLT 139*  --  148* 146*    Basic Metabolic Panel: Recent Labs  Lab 09/04/24 2103 09/05/24 0152 09/05/24 0504 09/06/24 0427  NA 139  137 136  --  137  K 4.5  4.5 4.5  --  4.6  CL 86*  85*  --   --  85*  CO2 42*  --   --  41*  GLUCOSE 83  83  --   --  109*  BUN 16  19  --   --  15  CREATININE 0.64  0.90  --  0.65 0.69  CALCIUM 9.4  --   --  9.4  MG  --   --   --  1.9  PHOS  --   --   --  3.6    GFR: Estimated Creatinine Clearance: 67.2 mL/min (by C-G formula based on SCr of 0.69 mg/dL).  Liver Function Tests: Recent Labs  Lab 09/05/24 0147 09/06/24 0427  AST 25  --   ALT 19  --   ALKPHOS 62  --   BILITOT 0.2  --   PROT 6.6  --   ALBUMIN  3.7 3.4*    CBG: No results for input(s): GLUCAP in the last 168 hours.   Recent Results (from the past 240 hours)  Urine Culture     Status: Abnormal (Preliminary result)   Collection Time: 09/04/24  9:45 PM   Specimen: Urine, Clean Catch  Result Value Ref Range Status   Specimen Description URINE, CLEAN CATCH  Final   Special Requests NONE  Final   Culture (A)  Final    >=100,000 COLONIES/mL ESCHERICHIA COLI SUSCEPTIBILITIES TO FOLLOW Performed at [facility], [address] [place], [place] [address]    Report Status PENDING  Incomplete         Radiology Studies: CT  Angio Chest PE W/Cm &/Or Wo Cm Result Date: 09/05/2024 CLINICAL DATA:  Chest and abdominal pain for 2 weeks, initial encounter EXAM: CT ANGIOGRAPHY CHEST CT ABDOMEN AND PELVIS WITH CONTRAST TECHNIQUE: Multidetector CT imaging of the chest was performed using the standard protocol during bolus administration of intravenous contrast. Multiplanar CT image reconstructions and MIPs were obtained to evaluate the vascular anatomy. Multidetector CT imaging of the abdomen and pelvis was performed using the standard  protocol during bolus administration of intravenous contrast. RADIATION DOSE REDUCTION: This exam was performed according to the departmental dose-optimization program which includes automated exposure control, adjustment of the mA and/or kV according to patient size and/or use of iterative reconstruction technique. CONTRAST:  75mL OMNIPAQUE  IOHEXOL  350 MG/ML SOLN COMPARISON:  Chest x-ray from the previous day. FINDINGS: CTA CHEST FINDINGS Cardiovascular: Atherosclerotic calcifications of the thoracic aorta are noted. No aneurysmal dilatation or dissection is noted. Pulmonary artery shows a normal branching pattern. No intraluminal filling defect to suggest pulmonary embolism is seen. The left pulmonary artery is truncated consistent with prior surgery of the left. Mediastinum/Nodes: Mediastinum is somewhat shifted to the right consistent with the mass effect from changes in the left hemithorax. No hilar or mediastinal adenopathy is noted. The esophagus as visualized is within normal limits. Lungs/Pleura: Emphysematous changes are noted throughout the right lung. Minimal effusion is seen. No focal infiltrate or sizable parenchymal nodule is noted. Postsurgical changes are noted in the left hemithorax with a rim calcified fluid collection identified stable in appearance from multiple previous exams. Musculoskeletal: No chest wall abnormality. No acute or significant osseous findings. Review of the MIP images  confirms the above findings. CT ABDOMEN and PELVIS FINDINGS Hepatobiliary: Gallbladder is decompressed. Scattered cysts are noted within the liver. Pancreas: Unremarkable. No pancreatic ductal dilatation or surrounding inflammatory changes. Spleen: Normal in size without focal abnormality. Adrenals/Urinary Tract: Adrenal glands are within normal limits. Kidneys demonstrate a normal enhancement pattern bilaterally. No renal calculi or obstructive changes are noted. The bladder is partially distended. Stomach/Bowel: No obstructive or inflammatory changes of the colon are noted. The appendix is not well visualized. No inflammatory changes to suggest appendicitis are noted. The small bowel and stomach are within normal limits. Vascular/Lymphatic: Aortic atherosclerosis. No enlarged abdominal or pelvic lymph nodes. Reproductive: Prostate is enlarged indenting upon the inferior aspect of the bladder. Other: Mild free fluid is noted within the pelvis. Musculoskeletal: No acute or significant osseous findings. Review of the MIP images confirms the above findings. IMPRESSION: CTA of the chest: No evidence of pulmonary emboli. Atherosclerotic calcifications and emphysematous change. CT abdomen and pelvis: Chronic changes without acute abnormality. Electronically Signed   By: Oneil Devonshire M.D.   On: 09/05/2024 03:25   CT ABDOMEN PELVIS W CONTRAST Result Date: 09/05/2024 CLINICAL DATA:  Chest and abdominal pain for 2 weeks, initial encounter EXAM: CT ANGIOGRAPHY CHEST CT ABDOMEN AND PELVIS WITH CONTRAST TECHNIQUE: Multidetector CT imaging of the chest was performed using the standard protocol during bolus administration of intravenous contrast. Multiplanar CT image reconstructions and MIPs were obtained to evaluate the vascular anatomy. Multidetector CT imaging of the abdomen and pelvis was performed using the standard protocol during bolus administration of intravenous contrast. RADIATION DOSE REDUCTION: This exam was  performed according to the departmental dose-optimization program which includes automated exposure control, adjustment of the mA and/or kV according to patient size and/or use of iterative reconstruction technique. CONTRAST:  75mL OMNIPAQUE  IOHEXOL  350 MG/ML SOLN COMPARISON:  Chest x-ray from the previous day. FINDINGS: CTA CHEST FINDINGS Cardiovascular: Atherosclerotic calcifications of the thoracic aorta are noted. No aneurysmal dilatation or dissection is noted. Pulmonary artery shows a normal branching pattern. No intraluminal filling defect to suggest pulmonary embolism is seen. The left pulmonary artery is truncated consistent with prior surgery of the left. Mediastinum/Nodes: Mediastinum is somewhat shifted to the right consistent with the mass effect from changes in the left hemithorax. No hilar or mediastinal adenopathy is noted. The esophagus as  visualized is within normal limits. Lungs/Pleura: Emphysematous changes are noted throughout the right lung. Minimal effusion is seen. No focal infiltrate or sizable parenchymal nodule is noted. Postsurgical changes are noted in the left hemithorax with a rim calcified fluid collection identified stable in appearance from multiple previous exams. Musculoskeletal: No chest wall abnormality. No acute or significant osseous findings. Review of the MIP images confirms the above findings. CT ABDOMEN and PELVIS FINDINGS Hepatobiliary: Gallbladder is decompressed. Scattered cysts are noted within the liver. Pancreas: Unremarkable. No pancreatic ductal dilatation or surrounding inflammatory changes. Spleen: Normal in size without focal abnormality. Adrenals/Urinary Tract: Adrenal glands are within normal limits. Kidneys demonstrate a normal enhancement pattern bilaterally. No renal calculi or obstructive changes are noted. The bladder is partially distended. Stomach/Bowel: No obstructive or inflammatory changes of the colon are noted. The appendix is not well visualized.  No inflammatory changes to suggest appendicitis are noted. The small bowel and stomach are within normal limits. Vascular/Lymphatic: Aortic atherosclerosis. No enlarged abdominal or pelvic lymph nodes. Reproductive: Prostate is enlarged indenting upon the inferior aspect of the bladder. Other: Mild free fluid is noted within the pelvis. Musculoskeletal: No acute or significant osseous findings. Review of the MIP images confirms the above findings. IMPRESSION: CTA of the chest: No evidence of pulmonary emboli. Atherosclerotic calcifications and emphysematous change. CT abdomen and pelvis: Chronic changes without acute abnormality. Electronically Signed   By: Oneil Devonshire M.D.   On: 09/05/2024 03:25   DG Chest 2 View Result Date: 09/04/2024 CLINICAL DATA:  sob EXAM: CHEST - 2 VIEW COMPARISON:  CT chest 05/27/2024, chest x-ray 05/27/2024 FINDINGS: The heart and mediastinal contours are not well visualized due to overlying left lung disease. Associated persistent left to right midline shift. Redemonstration of complete opacification of the left hemithorax in the setting of known left pneumonectomy. Left pleural calcifications again noted. No focal consolidation. Coarsened interstitial markings with no overt pulmonary edema. Nonspecific blunting of the right costophrenic angle. No pneumothorax. No acute osseous abnormality. IMPRESSION: No acute findings within the right lung. Left pneumonectomy. Electronically Signed   By: Morgane  Naveau M.D.   On: 09/04/2024 21:15        Scheduled Meds:  arformoterol   15 mcg Nebulization BID   budesonide  (PULMICORT ) nebulizer solution  0.5 mg Nebulization BID   enoxaparin  (LOVENOX ) injection  40 mg Subcutaneous Q24H   furosemide   20 mg Intravenous Once   methylPREDNISolone  (SOLU-MEDROL ) injection  40 mg Intravenous Q12H   pantoprazole   40 mg Oral Daily   revefenacin   175 mcg Nebulization Daily   tamsulosin   0.4 mg Oral Daily   Continuous Infusions:  azithromycin   250 mg (09/06/24 1003)     LOS: 1 day       Brayton Lye, MD [facility]   To contact the attending provider between 7A-7P or the covering provider during after hours 7P-7A, please log into the web site [email or web address] and access using universal [place] password for that web site. If you do not have the password, please call the hospital operator.  09/06/2024, 1:07 PM

## 2024-09-06 NOTE — Plan of Care (Signed)
  Problem: Clinical Measurements: Goal: Ability to maintain clinical measurements within normal limits will improve Outcome: Progressing Goal: Will remain free from infection Outcome: Progressing Goal: Diagnostic test results will improve Outcome: Progressing Goal: Respiratory complications will improve Outcome: Progressing Goal: Cardiovascular complication will be avoided Outcome: Progressing   Problem: Coping: Goal: Level of anxiety will decrease Outcome: Progressing   Problem: Pain Managment: Goal: General experience of comfort will improve and/or be controlled Outcome: Progressing

## 2024-09-06 NOTE — Progress Notes (Signed)
 Physical Therapy Treatment Patient Details Name: [name] [name] MRN: [id number] DOB: 11-20-1951 Today's Date: 09/06/2024   History of Present Illness [name] [name] is a 73 y.o. male presenting with shortness of breath for about a week with dry cough and abdominal pain. Found to have COPD exacerbation. PMH of COPD/chronic hypoxic hypercapnic RF on 2 to 3 L and BiPAP but not compliant with BiPAP, PHTN, lung cancer s/p left pneumonectomy, BPH, GERD and HTN.    PT Comments  Pt presents with admitting diagnosis above. Co-treat with OT. Pt today was agreeable to ambulate short distance in room with no AD at supervision level. Pt reports prior to admission that he was mostly ind for household distances however would use a SPC if needed. Pt also reports that he has a HH aide that comes 3 days a week for 4 hours to assist with ADLs. Recommend HHPT upon DC. PT will continue to follow.     If plan is discharge home, recommend the following: A little help with walking and/or transfers;A little help with bathing/dressing/bathroom;Assistance with cooking/housework;Direct supervision/assist for medications management;Assist for transportation;Help with stairs or ramp for entrance   Can travel by private vehicle        Equipment Recommendations  None recommended by PT    Recommendations for Other Services       Precautions / Restrictions Precautions Precautions: Fall Recall of Precautions/Restrictions: Intact Restrictions Weight Bearing Restrictions Per Provider Order: No     Mobility  Bed Mobility               General bed mobility comments: Sitting EOB upon arrival    Transfers Overall transfer level: Needs assistance Equipment used: None Transfers: Sit to/from Stand Sit to Stand: Supervision           General transfer comment: no LOB noted or physical assistance provided however pt was noticably SOB upon standing. Cues for PLB while standing    Ambulation/Gait Ambulation/Gait  assistance: Supervision Gait Distance (Feet): 15 Feet Assistive device: None Gait Pattern/deviations: Trunk flexed, Decreased stride length, Step-through pattern   Gait velocity interpretation: 1.31 - 2.62 ft/sec, indicative of limited community ambulator   General Gait Details: Pt agreeable to ambulate to door and back. Pt with good speed and no LOB noted. Pt able to independently manage O2 lines.   Stairs             Wheelchair Mobility     Tilt Bed    Modified Rankin (Stroke Patients Only)       Balance Overall balance assessment: No apparent balance deficits (not formally assessed)                                          Communication Communication Communication: No apparent difficulties  Cognition Arousal: Alert Behavior During Therapy: WFL for tasks assessed/performed   PT - Cognitive impairments: No apparent impairments                         Following commands: Intact      Cueing Cueing Techniques: Verbal cues, Tactile cues  Exercises      General Comments General comments (skin integrity, edema, etc.): SpO2 between 91-94% on 3L.      Pertinent Vitals/Pain Pain Assessment Pain Assessment: No/denies pain    Home Living Family/patient expects to be discharged to:: Private residence Living Arrangements: Alone Available  Help at Discharge: Family;Available PRN/intermittently Type of Home: House Home Access: Stairs to enter Entrance Stairs-Rails: Right Entrance Stairs-Number of Steps: 3   Home Layout: One level Home Equipment: Cane - single point Additional Comments: home health aide 3 days a week 4 hours a day    Prior Function            PT Goals (current goals can now be found in the care plan section) Acute Rehab PT Goals Patient Stated Goal: to go home PT Goal Formulation: With patient Time For Goal Achievement: 09/20/24 Potential to Achieve Goals: Good    Frequency    Min 2X/week      PT  Plan      Co-evaluation              AM-PAC PT 6 Clicks Mobility   Outcome Measure  Help needed turning from your back to your side while in a flat bed without using bedrails?: A Little Help needed moving from lying on your back to sitting on the side of a flat bed without using bedrails?: A Little Help needed moving to and from a bed to a chair (including a wheelchair)?: A Little Help needed standing up from a chair using your arms (e.g., wheelchair or bedside chair)?: A Little Help needed to walk in hospital room?: A Little Help needed climbing 3-5 steps with a railing? : A Little 6 Click Score: 18    End of Session Equipment Utilized During Treatment: Gait belt;Oxygen Activity Tolerance: Patient tolerated treatment well Patient left: in bed;with call bell/phone within reach Nurse Communication: Mobility status PT Visit Diagnosis: Other abnormalities of gait and mobility (R26.89)     Time: 8667-8650 PT Time Calculation (min) (ACUTE ONLY): 17 min  Charges:      PT General Charges $$ ACUTE PT VISIT: 1 Visit                     Elek Holderness B, PT, DPT Acute Rehab Services [phone]    Trica Usery 09/06/2024, 3:01 PM

## 2024-09-06 NOTE — Progress Notes (Signed)
 PT Cancellation Note  Patient Details Name: [name] [name] MRN: [id number] DOB: 12/18/1951   Cancelled Treatment:    Reason Eval/Treat Not Completed: Patient declined, no reason specified (Pt declined stating that he would like to sleep. Politely request PT to come back later. Will follow up if time allows.)   [employer] 09/06/2024, 9:52 AM

## 2024-09-06 NOTE — Evaluation (Signed)
 Occupational Therapy Evaluation Patient Details Name: [name] [name] MRN: [id number] DOB: 1951/11/07 Today's Date: 09/06/2024   History of Present Illness   Orrin Dobos is a 73 y.o. male presenting with shortness of breath for about a week with dry cough and abdominal pain. Found to have COPD exacerbation. PMH of COPD/chronic hypoxic hypercapnic RF on 2 to 3 L and BiPAP but not compliant with BiPAP, PHTN, lung cancer s/p left pneumonectomy, BPH, GERD and HTN.     Clinical Impressions Patient reports living at home in a 1 level home and requires A for [facility] aid that comes in several times a week for 4 hours to help patient with bathing and IADLs. Patient reports that he uses cane on occupational for functional mobility.  Patients currently presents with increased SOB with activity and would benefit from additional OT intervention to address functional deficits of ADLs, and functional mobility along with EC/WS techniques.  Recommending HHOT to further increase independence and success at home.    If plan is discharge home, recommend the following:   A little help with bathing/dressing/bathroom;Assistance with cooking/housework;Assist for transportation;Direct supervision/assist for medications management     Functional Status Assessment   Patient has had a recent decline in their functional status and demonstrates the ability to make significant improvements in function in a reasonable and predictable amount of time.     Equipment Recommendations   None recommended by OT     Recommendations for Other Services         Precautions/Restrictions   Precautions Precautions: Fall Recall of Precautions/Restrictions: Intact Restrictions Weight Bearing Restrictions Per Provider Order: No     Mobility Bed Mobility               General bed mobility comments: Sitting EOB upon arrival    Transfers Overall transfer level: Needs assistance Equipment used: None Transfers:  Sit to/from Stand Sit to Stand: Supervision           General transfer comment: no LOB noted or physical assistance provided however pt was noticably SOB upon standing. Cues for PLB while standing      Balance Overall balance assessment: No apparent balance deficits (not formally assessed)                                         ADL either performed or assessed with clinical judgement   ADL Overall ADL's : Needs assistance/impaired Eating/Feeding: Independent   Grooming: Wash/dry face;Wash/dry hands;Standing;Supervision/safety   Upper Body Bathing: Set up;Sitting   Lower Body Bathing: Minimal assistance   Upper Body Dressing : Set up;Sitting   Lower Body Dressing: Minimal assistance   Toilet Transfer: Supervision/safety   Toileting- Clothing Manipulation and Hygiene: Supervision/safety;Contact guard assist       Functional mobility during ADLs: Supervision/safety       Vision Baseline Vision/History: 1 Wears glasses Patient Visual Report: No change from baseline       Perception         Praxis         Pertinent Vitals/Pain Pain Assessment Pain Assessment: No/denies pain     Extremity/Trunk Assessment Upper Extremity Assessment Upper Extremity Assessment: Overall WFL for tasks assessed   Lower Extremity Assessment Lower Extremity Assessment: Defer to PT evaluation   Cervical / Trunk Assessment Cervical / Trunk Assessment: Kyphotic   Communication Communication Communication: No apparent difficulties   Cognition Arousal: Alert Behavior During  Therapy: WFL for tasks assessed/performed Cognition: No apparent impairments                               Following commands: Intact       Cueing  General Comments   Cueing Techniques: Verbal cues;Tactile cues  SpO2 between 91-94% on 3L.   Exercises     Shoulder Instructions      Home Living Family/patient expects to be discharged to:: Private  residence Living Arrangements: Alone Available Help at Discharge: Family;Available PRN/intermittently Type of Home: House Home Access: Stairs to enter [employer] of Steps: 3 Entrance Stairs-Rails: Right Home Layout: One level     Bathroom Shower/Tub: Chief Strategy Officer: Handicapped height Bathroom Accessibility: Yes   Home Equipment: Cane - single point   Additional Comments:  (that helps with IADLs and bathing for patient)      Prior Functioning/Environment Prior Level of Function : Needs assist       Physical Assist : ADLs (physical)   ADLs (physical): Bathing;IADLs Mobility Comments: Pt reports using a SPC when he needs it. ADLs Comments: HH aide helps with ADLs per pt.    OT Problem List: Decreased strength;Decreased safety awareness;Decreased activity tolerance   OT Treatment/Interventions: Therapeutic exercise;Self-care/ADL training;Therapeutic activities;Patient/family education      OT Goals(Current goals can be found in the care plan section)   Acute Rehab OT Goals OT Goal Formulation: With patient Time For Goal Achievement: 09/20/24 Potential to Achieve Goals: Good   OT Frequency:  Min 2X/week    Co-evaluation              AM-PAC OT 6 Clicks Daily Activity     Outcome Measure Help from another [place] eating meals?: None Help from another [place] taking care of personal grooming?: None Help from another [place] toileting, which includes using toliet, bedpan, or urinal?: A Little Help from another [place] bathing (including washing, rinsing, drying)?: A Little Help from another [place] to put on and taking off regular upper body clothing?: None Help from another [place] to put on and taking off regular lower body clothing?: A Little 6 Click Score: 21   End of Session Nurse Communication: Mobility status  Activity Tolerance: Patient tolerated treatment well Patient left: in bed;with call bell/phone within reach  OT  Visit Diagnosis: Unsteadiness on feet (R26.81);Muscle weakness (generalized) (M62.81)                Time: 8667-8650 OT Time Calculation (min): 17 min Charges:  OT General Charges $OT Visit: 1 Visit OT Evaluation $OT Eval Moderate Complexity: 1 Mod  Lamarr Pouch OT/L  Lamarr JONETTA Pouch 09/06/2024, 4:19 PM

## 2024-09-07 ENCOUNTER — Telehealth: Payer: Self-pay | Admitting: Pulmonary Disease

## 2024-09-07 DIAGNOSIS — J9622 Acute and chronic respiratory failure with hypercapnia: Principal | ICD-10-CM

## 2024-09-07 DIAGNOSIS — J441 Chronic obstructive pulmonary disease with (acute) exacerbation: Secondary | ICD-10-CM | POA: Diagnosis not present

## 2024-09-07 DIAGNOSIS — J9621 Acute and chronic respiratory failure with hypoxia: Secondary | ICD-10-CM | POA: Diagnosis not present

## 2024-09-07 LAB — CBC
HCT: 31.5 % — ABNORMAL LOW (ref 39.0–52.0)
Hemoglobin: 9.5 g/dL — ABNORMAL LOW (ref 13.0–17.0)
MCH: 28.4 pg (ref 26.0–34.0)
MCHC: 30.2 g/dL (ref 30.0–36.0)
MCV: 94 fL (ref 80.0–100.0)
Platelets: 141 K/uL — ABNORMAL LOW (ref 150–400)
RBC: 3.35 MIL/uL — ABNORMAL LOW (ref 4.22–5.81)
RDW: 11.9 % (ref 11.5–15.5)
WBC: 5.4 K/uL (ref 4.0–10.5)
nRBC: 0 % (ref 0.0–0.2)

## 2024-09-07 LAB — PHOSPHORUS: Phosphorus: 3.4 mg/dL (ref 2.5–4.6)

## 2024-09-07 LAB — URINE CULTURE: Culture: >=100000 — AB

## 2024-09-07 LAB — BASIC METABOLIC PANEL WITH GFR
Anion gap: 6 (ref 5–15)
BUN: 23 mg/dL (ref 8–23)
CO2: 42 mmol/L — ABNORMAL HIGH (ref 22–32)
Calcium: 9.2 mg/dL (ref 8.9–10.3)
Chloride: 87 mmol/L — ABNORMAL LOW (ref 98–111)
Creatinine, Ser: 0.75 mg/dL (ref 0.61–1.24)
GFR, Estimated: >60 mL/min (ref >60)
Glucose, Bld: 192 mg/dL — ABNORMAL HIGH (ref 70–99)
Potassium: 4.7 mmol/L (ref 3.5–5.1)
Sodium: 135 mmol/L (ref 135–145)

## 2024-09-07 LAB — MAGNESIUM: Magnesium: 1.9 mg/dL (ref 1.7–2.4)

## 2024-09-07 LAB — GLUCOSE, CAPILLARY: Glucose-Capillary: 147 mg/dL — ABNORMAL HIGH (ref 70–99)

## 2024-09-07 MED ORDER — PREDNISONE 20 MG PO TABS
40.0000 mg | ORAL_TABLET | Freq: Every day | ORAL | Status: AC
  Administered 2024-09-08 – 2024-09-12 (×5): 40 mg via ORAL
  Filled 2024-09-07 (×5): qty 2

## 2024-09-07 NOTE — Telephone Encounter (Signed)
 Pleaes schedule hospital follow up with Dr. Kassie in 2-3 weeks for COPD exacerbation.  Thanks, JD

## 2024-09-07 NOTE — Progress Notes (Signed)
 PROGRESS NOTE    [name] [name]  FMW:[id number] DOB: 1951/01/28 DOA: 09/04/2024 PCP: [facility]   Chief Complaint  Patient presents with   Abdominal Pain   Shortness of Breath    Brief Narrative:    [name] [name] is a 73 y.o. male with PMH of COPD/chronic hypoxic hypercapnic RF on 2 to 3 L and BiPAP but not compliant with BiPAP, PHTN, lung cancer s/p left pneumonectomy, BPH, GERD and HTN presenting with shortness of breath for about a week with dry cough and abdominal pain.   Patient is sleepy but wakes to voice.  He is oriented x 4.   Reports difficulty breathing for several weeks.  He also reports some dry cough.  Not compliant with BiPAP.  Uses his nebulizer but not consistently.  Also not sure how much he is getting out of it since he mostly tends to fall asleep before he even tries to put it on.  He is followed by pulmonology and missed his last appointment 4 days ago.  Denies chest pain, fever, runny nose, sore throat, nausea, vomiting.  Abdominal pain is central and diffuse.  Not able to describe.  Pain is not severe.  He denies diarrhea or constipation.  Denies UTI symptoms but chronic urinary hesitancy likely from BPH.  He denies burning with urination, frequency or urgency.     Patient denies smoking cigarettes, drinking alcohol  or recreational drug use.  He is not interested in cardiopulmonary resuscitation in an event of sudden cardiopulmonary arrest.  He states I do not want people pumping on my chest.   In ED, stable vitals.  VBG suggests chronic respiratory acidosis.  BMP bicarb 42.  Hgb 10.5.  D-dimer 2.0.  CXR and CT angio chest, abdomen and pelvis without acute finding.  UA with many bacteria.  BNP within normal.  Received Solu-Medrol , ceftriaxone , Zithromax , nebulizers.    Assessment & Plan:   Principal Problem:   COPD exacerbation (HCC) Active Problems:   COPD (chronic obstructive pulmonary disease) (HCC)   COPD with acute exacerbation Chronic  hypoxic and hypercapnic respiratory failure History of lung cancer s/p left pneumonectomy -Presents with dyspnea, increased work of breathing, cough. -Compliant with BiPAP. -Workup significant for hypercapnia, but appears with chronic respiratory acidosis -Improved with BiPAP. -Given cough with phlegm, continue with IV antibiotics -Continue with IV steroids, transition to p.o. steroids -Per RT, refusing DuoNeb and asking for home nebulizers. Ordered Brovana , Pulmicort  and Yupelri  and changed DuoNebs to as needed. -Continue Solu-Medrol , Zithromax .  -BiPAP as needed. -DNR/DNI. -PCCM input greatly appreciated -Start low-dose Lasix   Pulmonary hypertension - Resume home meds after med rec.   Essential hypertension:  - Resume home meds after med rec as appropriate - P.o. hydralazine  as needed   Elevated D-dimer -CT angio chest negative for PE.     BPH with urinary hesitancy - Start Flomax .   Bacteriuria/possible UTI: Patient has chronic urinary hesitancy.  Denies dysuria.  Received CTX. - Follow urine culture   Abdominal pain: Symptoms are resolved.  Abdominal exam benign.  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  Started on Ensure    DVT prophylaxis: (Lovenox ) Code Status: (DNR) Family Communication: Discussed with patient Disposition:   Status is: Inpatient    Consultants:  PCCM  Subjective: Still reports dyspnea and cough, but he is feeling better today  Objective: Vitals:   09/07/24 0400 09/07/24 0746 09/07/24 0755 09/07/24 1132  BP: 113/72  (!) 150/80 (!) 160/82  Pulse: 80     Resp: (!) 22     Temp: 98.5 F (36.9 C)  98.1 F (36.7 C) 98.7 F (37.1 C)  TempSrc: Oral  Oral Oral  SpO2: 97% 96%    Weight:      Height:        Intake/Output Summary (Last 24 hours) at 09/07/2024 1330 Last data filed at 09/07/2024 1210 Gross per 24 hour  Intake 200 ml   Output 2850 ml  Net -2650 ml   Filed Weights   09/04/24 2047  Weight: 55.3 kg    Examination:  Awake Alert, Oriented X 3, well-appearing Symmetrical Chest wall movement, decreased air entry today, wheezing much improved RRR,No Gallops,Rubs or new Murmurs, No Parasternal Heave +ve B.Sounds, Abd Soft, No tenderness, No rebound - guarding or rigidity. No Cyanosis, +1 edema      Data Reviewed: I have personally reviewed following labs and imaging studies  CBC: Recent Labs  Lab 09/04/24 2103 09/05/24 0152 09/05/24 0504 09/06/24 0427 09/07/24 0250  WBC 5.0  --  6.1 3.7* 5.4  HGB 10.5*  12.2* 11.9* 10.3* 10.1* 9.5*  HCT 36.5*  36.0* 35.0* 35.1* 32.9* 31.5*  MCV 98.6  --  98.6 93.2 94.0  PLT 139*  --  148* 146* 141*    Basic Metabolic Panel: Recent Labs  Lab 09/04/24 2103 09/05/24 0152 09/05/24 0504 09/06/24 0427 09/07/24 0250  NA 139  137 136  --  137 135  K 4.5  4.5 4.5  --  4.6 4.7  CL 86*  85*  --   --  85* 87*  CO2 42*  --   --  41* 42*  GLUCOSE 83  83  --   --  109* 192*  BUN 16  19  --   --  15 23  CREATININE 0.64  0.90  --  0.65 0.69 0.75  CALCIUM 9.4  --   --  9.4 9.2  MG  --   --   --  1.9 1.9  PHOS  --   --   --  3.6 3.4    GFR: Estimated Creatinine Clearance: 67.2 mL/min (by C-G formula based on SCr of 0.75 mg/dL).  Liver Function Tests: Recent Labs  Lab 09/05/24 0147 09/06/24 0427  AST 25  --   ALT 19  --   ALKPHOS 62  --   BILITOT 0.2  --   PROT 6.6  --   ALBUMIN  3.7 3.4*    CBG: Recent Labs  Lab 09/07/24 1226  GLUCAP 147*     Recent Results (from the past 240 hours)  Urine Culture     Status: Abnormal   Collection Time: 09/04/24  9:45 PM   Specimen: Urine, Clean Catch  Result Value Ref Range Status   Specimen Description URINE, CLEAN CATCH  Final   Special Requests   Final    NONE Performed at [facility], [address], [place], [place] [address]    Culture >=100,000 COLONIES/mL ESCHERICHIA COLI (A)   Final   Report Status 09/07/2024 FINAL  Final   Organism ID, Bacteria ESCHERICHIA COLI (A)  Final      Susceptibility   Escherichia coli - MIC*    AMPICILLIN >=32 RESISTANT Resistant     CEFAZOLIN (URINE) Value in next row Sensitive      4 SENSITIVEThis is a modified FDA-approved test that has been validated and its performance characteristics determined by  the reporting laboratory.  This laboratory is certified under the Clinical Laboratory Improvement Amendments CLIA as qualified to perform high complexity clinical laboratory testing.    CEFEPIME Value in next row Sensitive      4 SENSITIVEThis is a modified FDA-approved test that has been validated and its performance characteristics determined by the reporting laboratory.  This laboratory is certified under the Clinical Laboratory Improvement Amendments CLIA as qualified to perform high complexity clinical laboratory testing.    ERTAPENEM Value in next row Sensitive      4 SENSITIVEThis is a modified FDA-approved test that has been validated and its performance characteristics determined by the reporting laboratory.  This laboratory is certified under the Clinical Laboratory Improvement Amendments CLIA as qualified to perform high complexity clinical laboratory testing.    CEFTRIAXONE  Value in next row Sensitive      4 SENSITIVEThis is a modified FDA-approved test that has been validated and its performance characteristics determined by the reporting laboratory.  This laboratory is certified under the Clinical Laboratory Improvement Amendments CLIA as qualified to perform high complexity clinical laboratory testing.    CIPROFLOXACIN Value in next row Sensitive      4 SENSITIVEThis is a modified FDA-approved test that has been validated and its performance characteristics determined by the reporting laboratory.  This laboratory is certified under the Clinical Laboratory Improvement Amendments CLIA as qualified to perform high complexity clinical  laboratory testing.    GENTAMICIN Value in next row Sensitive      4 SENSITIVEThis is a modified FDA-approved test that has been validated and its performance characteristics determined by the reporting laboratory.  This laboratory is certified under the Clinical Laboratory Improvement Amendments CLIA as qualified to perform high complexity clinical laboratory testing.    NITROFURANTOIN Value in next row Sensitive      4 SENSITIVEThis is a modified FDA-approved test that has been validated and its performance characteristics determined by the reporting laboratory.  This laboratory is certified under the Clinical Laboratory Improvement Amendments CLIA as qualified to perform high complexity clinical laboratory testing.    TRIMETH /SULFA  Value in next row Sensitive      4 SENSITIVEThis is a modified FDA-approved test that has been validated and its performance characteristics determined by the reporting laboratory.  This laboratory is certified under the Clinical Laboratory Improvement Amendments CLIA as qualified to perform high complexity clinical laboratory testing.    AMPICILLIN/SULBACTAM Value in next row Intermediate      4 SENSITIVEThis is a modified FDA-approved test that has been validated and its performance characteristics determined by the reporting laboratory.  This laboratory is certified under the Clinical Laboratory Improvement Amendments CLIA as qualified to perform high complexity clinical laboratory testing.    PIP/TAZO Value in next row Sensitive      <=4 SENSITIVEThis is a modified FDA-approved test that has been validated and its performance characteristics determined by the reporting laboratory.  This laboratory is certified under the Clinical Laboratory Improvement Amendments CLIA as qualified to perform high complexity clinical laboratory testing.    MEROPENEM Value in next row Sensitive      <=4 SENSITIVEThis is a modified FDA-approved test that has been validated and its  performance characteristics determined by the reporting laboratory.  This laboratory is certified under the Clinical Laboratory Improvement Amendments CLIA as qualified to perform high complexity clinical laboratory testing.    * >=100,000 COLONIES/mL ESCHERICHIA COLI         Radiology Studies: No results found.  Scheduled Meds:  arformoterol   15 mcg Nebulization BID   azithromycin   250 mg Oral Daily   budesonide  (PULMICORT ) nebulizer solution  0.5 mg Nebulization BID   carvedilol   3.125 mg Oral BID   enoxaparin  (LOVENOX ) injection  40 mg Subcutaneous Q24H   feeding supplement  237 mL Oral BID BM   furosemide   20 mg Intravenous Daily   pantoprazole   40 mg Oral Daily   [START ON 09/08/2024] predniSONE   40 mg Oral Q breakfast   revefenacin   175 mcg Nebulization Daily   tamsulosin   0.4 mg Oral Daily   Continuous Infusions:     LOS: 2 days       Brayton Lye, MD [facility]   To contact the attending provider between 7A-7P or the covering provider during after hours 7P-7A, please log into the web site [email or web address] and access using universal [place] password for that web site. If you do not have the password, please call the hospital operator.  09/07/2024, 1:30 PM

## 2024-09-07 NOTE — Consult Note (Signed)
 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 with shortness of breath, dry cough and abdominal pain.   In ED, stable vitals.  VBG 7.45, pCO 73, pO2 42.  BMP bicarb 42.  Hgb 10.5.  D-dimer 2.0.  CXR and CT angio chest, abdomen and pelvis without acute finding.  UA with many bacteria.  BNP within normal.  Received Solu-Medrol , ceftriaxone , Zithromax , nebulizers.   Patient reports increasing shortness of breath and abdominal pain over recent days.   He is feeling a bit better since admission.  Pertinent  Medical History   Past Medical History:  Diagnosis Date   Cancer (HCC)    lung   CHF (congestive heart failure) (HCC)    Diabetes mellitus without complication (HCC)    Dyspnea    Hyperlipidemia    Hypertension    Significant Hospital Events: Including procedures, antibiotic start and stop dates in addition to other pertinent events   9/18 admitted 9/19 PCCM consulted  Interim History / Subjective:   He is feeling better He is using CPAP at night Leg swelling is better  Objective    Blood pressure (!) 150/80, pulse 80, temperature 98.1 F (36.7 C), temperature source Oral, resp. rate (!) 22, height 5' 9 (1.753 m), weight 55.3 kg, SpO2 96%.        Intake/Output Summary (Last 24 hours) at 09/07/2024 0810 Last data filed at 09/07/2024 9241 Gross per 24 hour  Intake 200 ml  Output 2100 ml  Net -1900 ml   Filed Weights   09/04/24 2047  Weight: 55.3 kg    Examination: General: elderly male, frail appearing, no distress HENT: [place]/AT, moist mucous membranes Lungs: diminished breath sounds Cardiovascular: rrr Abdomen: soft, non-tender,  non-distended Extremities: warm, 1+ edema Neuro: alert, oriented GU: n/a  CTA Chest/CT Abd/Pelvis CTA of the chest: No evidence of pulmonary emboli. Atherosclerotic calcifications and emphysematous change. CT abdomen and pelvis: Chronic changes without acute abnormality.  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 Concern this could be contributing to increased CO2 retention - Diuresis as able  PCCM will sign off and arrange hospital follow up with Dr. Kassie   Labs   CBC: Recent Labs  Lab 09/04/24 2103 09/05/24 0152 09/05/24 0504 09/06/24 0427 09/07/24 0250  WBC 5.0  --  6.1 3.7* 5.4  HGB 10.5*  12.2* 11.9* 10.3* 10.1* 9.5*  HCT 36.5*  36.0* 35.0* 35.1* 32.9* 31.5*  MCV 98.6  --  98.6 93.2 94.0  PLT 139*  --  148* 146* 141*    Basic Metabolic Panel: Recent Labs  Lab 09/04/24 2103 09/05/24 0152 09/05/24 0504 09/06/24 0427 09/07/24 0250  NA 139  137 136  --  137 135  K 4.5  4.5 4.5  --  4.6 4.7  CL 86*  85*  --   --  85* 87*  CO2 42*  --   --  41* 42*  GLUCOSE 83  83  --   --  109* 192*  BUN 16  19  --   --  15 23  CREATININE 0.64  0.90  --  0.65 0.69 0.75  CALCIUM 9.4  --   --  9.4 9.2  MG  --   --   --  1.9 1.9  PHOS  --   --   --  3.6 3.4   GFR: Estimated Creatinine Clearance: 67.2 mL/min (by C-G formula based on SCr of 0.75 mg/dL). Recent Labs  Lab 09/04/24 2103 09/05/24 0504 09/06/24 0427 09/07/24 0250  WBC 5.0 6.1 3.7* 5.4    Liver Function Tests: Recent Labs  Lab 09/05/24 0147 09/06/24 0427  AST 25  --   ALT 19  --   ALKPHOS 62   --   BILITOT 0.2  --   PROT 6.6  --   ALBUMIN  3.7 3.4*   No results for input(s): LIPASE, AMYLASE in the last 168 hours. No results for input(s): AMMONIA in the last 168 hours.  ABG    Component Value Date/Time   PHART 7.4 05/28/2024 0017   PCO2ART 93 (HH) 05/28/2024 0017   PO2ART 63 (L) 05/28/2024 0017   HCO3 51.5 (H) 09/05/2024 0152   TCO2 >50 (H) 09/05/2024 0152   O2SAT 76 09/05/2024 0152     Coagulation Profile: No results for input(s): INR, PROTIME in the last 168 hours.  Cardiac Enzymes: No results for input(s): CKTOTAL, CKMB, CKMBINDEX, TROPONINI in the last 168 hours.  HbA1C: Hgb A1c MFr Bld  Date/Time Value Ref Range Status  08/23/2021 09:38 AM 5.5 4.8 - 5.6 % Final    Comment:    (NOTE) Pre diabetes:          5.7%-6.4%  Diabetes:              >6.4%  Glycemic control for   <7.0% adults with diabetes     CBG: No results for input(s): GLUCAP in the last 168 hours.     Critical care time: n/a    Dorn Chill, MD [facility] Office: [phone]   See Amion for personal pager PCCM on call pager [phone] until 7pm. Please call Elink 7p-7a. [phone]

## 2024-09-07 NOTE — Plan of Care (Signed)
  Problem: Education: Goal: Knowledge of General Education information will improve Description: Including pain rating scale, medication(s)/side effects and non-pharmacologic comfort measures Outcome: Progressing   Problem: Coping: Goal: Level of anxiety will decrease Outcome: Progressing   Problem: Pain Managment: Goal: General experience of comfort will improve and/or be controlled Outcome: Progressing

## 2024-09-08 DIAGNOSIS — J441 Chronic obstructive pulmonary disease with (acute) exacerbation: Secondary | ICD-10-CM | POA: Diagnosis not present

## 2024-09-08 LAB — CBC
HCT: 30.5 % — ABNORMAL LOW (ref 39.0–52.0)
Hemoglobin: 9.3 g/dL — ABNORMAL LOW (ref 13.0–17.0)
MCH: 29.1 pg (ref 26.0–34.0)
MCHC: 30.5 g/dL (ref 30.0–36.0)
MCV: 95.3 fL (ref 80.0–100.0)
Platelets: 144 K/uL — ABNORMAL LOW (ref 150–400)
RBC: 3.2 MIL/uL — ABNORMAL LOW (ref 4.22–5.81)
RDW: 11.9 % (ref 11.5–15.5)
WBC: 7 K/uL (ref 4.0–10.5)
nRBC: 0 % (ref 0.0–0.2)

## 2024-09-08 LAB — BASIC METABOLIC PANEL WITH GFR
BUN: 24 mg/dL — ABNORMAL HIGH (ref 8–23)
CO2: >45 mmol/L — ABNORMAL HIGH (ref 22–32)
Calcium: 9.1 mg/dL (ref 8.9–10.3)
Chloride: 83 mmol/L — ABNORMAL LOW (ref 98–111)
Creatinine, Ser: 0.71 mg/dL (ref 0.61–1.24)
GFR, Estimated: >60 mL/min (ref >60)
Glucose, Bld: 109 mg/dL — ABNORMAL HIGH (ref 70–99)
Potassium: 3.9 mmol/L (ref 3.5–5.1)
Sodium: 137 mmol/L (ref 135–145)

## 2024-09-08 MED ORDER — ACETAZOLAMIDE 250 MG PO TABS
250.0000 mg | ORAL_TABLET | Freq: Three times a day (TID) | ORAL | Status: AC
  Administered 2024-09-08 (×3): 250 mg via ORAL
  Filled 2024-09-08 (×3): qty 1

## 2024-09-08 MED ORDER — SODIUM CHLORIDE 0.9 % IV SOLN
1.0000 g | INTRAVENOUS | Status: AC
  Administered 2024-09-08 – 2024-09-10 (×3): 1 g via INTRAVENOUS
  Filled 2024-09-08 (×3): qty 10

## 2024-09-08 NOTE — Plan of Care (Signed)

## 2024-09-08 NOTE — Progress Notes (Signed)
 Patient has BiPap machine from home and is familiar with equipment and procedure, as he wears at home. He states that he will self-administer.

## 2024-09-08 NOTE — Plan of Care (Signed)
  Problem: Education: Goal: Knowledge of General Education information will improve Description: Including pain rating scale, medication(s)/side effects and non-pharmacologic comfort measures Outcome: Progressing   Problem: Clinical Measurements: Goal: Will remain free from infection Outcome: Progressing Goal: Diagnostic test results will improve Outcome: Progressing   Problem: Activity: Goal: Risk for activity intolerance will decrease Outcome: Progressing   Problem: Nutrition: Goal: Adequate nutrition will be maintained Outcome: Progressing   Problem: Coping: Goal: Level of anxiety will decrease Outcome: Progressing   Problem: Elimination: Goal: Will not experience complications related to bowel motility Outcome: Progressing

## 2024-09-08 NOTE — Progress Notes (Signed)
 Occupational Therapy Treatment Patient Details Name: [name] [name] MRN: [id number] DOB: 03-23-1951 Today's Date: 09/08/2024   History of present illness [name] [name] is a 73 y.o. male presenting with shortness of breath for about a week with dry cough and abdominal pain. Found to have COPD exacerbation. PMH of COPD/chronic hypoxic hypercapnic RF on 2 to 3 L and BiPAP but not compliant with BiPAP, PHTN, lung cancer s/p left pneumonectomy, BPH, GERD and HTN.   OT comments  OT session largely focused on therapeutic exercises in sitting (see details below) to increase activity tolerance and strength for carryover to functional tasks. Pt participated well in exercises, but required frequent rest breaks due to pt reporting feeling of significant SOB. Pt's O2 sat >/93% on 3L continuous O2 through nasal cannula except for one brief drop to 88% on 3L when pt stood to reposition self in recliner with O2 sat quickly recovering to 96% on 3L once in sitting. OT educated pt in use of PLB and initiated education in energy conservation strategies with pt verbalizing understanding and demonstrating understanding of PLB through teach back. Pt expresses frustration with ongoing feeling of SOB even with improved O2 sat with OT offering supportive listening and encouragement to continue with POC, including ongoing participation in skilled rehab, use of incentive spirometer, and use of PLB. Pt will benefit from reinforcement of education in use of PLB and further education in energy conservation strategies. Pt participated well in session and will benefit from continued acute OT services. Post acute discharge, pt will benefit from [facility] OT.       If plan is discharge home, recommend the following:  A little help with walking and/or transfers;A little help with bathing/dressing/bathroom;Assistance with cooking/housework;Direct supervision/assist for medications management;Assist for transportation;Help with stairs or ramp for  entrance   Equipment Recommendations  None recommended by OT    Recommendations for Other Services      Precautions / Restrictions Precautions Precautions: Fall;Other (comment) Recall of Precautions/Restrictions: Intact Precaution/Restrictions Comments: watch O2 Restrictions Weight Bearing Restrictions Per Provider Order: No       Mobility Bed Mobility               General bed mobility comments: Pt sitting receliner at beginning and end of session    Transfers Overall transfer level: Needs assistance Equipment used: None Transfers: Sit to/from Stand Sit to Stand: Supervision           General transfer comment: Pt declined further mobility this session due to fatigue     Balance Overall balance assessment: No apparent balance deficits (not formally assessed)                                         ADL either performed or assessed with clinical judgement   ADL Overall ADL's : Needs assistance/impaired                                       General ADL Comments: ADLs not addressed this session.    Extremity/Trunk Assessment Upper Extremity Assessment Upper Extremity Assessment: Right hand dominant;Overall [facility] for tasks assessed   Lower Extremity Assessment Lower Extremity Assessment: Defer to PT evaluation        Vision       Perception     Praxis  Communication Communication Communication: No apparent difficulties   Cognition Arousal: Alert Behavior During Therapy: WFL for tasks assessed/performed Cognition: No apparent impairments             OT - Cognition Comments: AAOx4 and pleasant throughout session with cognition WFL for tasks assessed.                 Following commands: Intact        Cueing   Cueing Techniques: Verbal cues, Visual cues  Exercises Exercises: General Upper Extremity, General Lower Extremity General Exercises - Upper Extremity Shoulder Flexion: AROM,  Strengthening, Both, 10 reps, Seated (for increased activity tolerance) Shoulder Extension: AROM, Strengthening, Both, 10 reps, Seated (for increased activity tolerance) Shoulder Horizontal ABduction: AROM, Strengthening, Both, 10 reps, Seated (for increased activity tolerance) Shoulder Horizontal ADduction: AROM, Strengthening, Both, 10 reps, Seated (for increased activity tolerance) Elbow Flexion: AROM, Strengthening, Both, 10 reps, Seated (for increased activity tolerance) Elbow Extension: AROM, Strengthening, Both, 10 reps, Seated (for increased activity tolerance) Chair Push Up: AROM, Strengthening, Both, 10 reps, Seated (for increased activity tolerance) General Exercises - Lower Extremity Straight Leg Raises: AROM, Strengthening, Both, 10 reps, Seated (for increased activity tolerance) Hip Flexion/Marching: AROM, Strengthening, Both, 10 reps, Seated (for increased activity tolerance)    Shoulder Instructions       General Comments O2 largely >/93% on 3L continuous O2 through nasal cannula with one brief drop to 88% on 3L when pt in standing to reposition self in recliner with O2 sat quickly recovering to 96% on 3L once in sitting.    Pertinent Vitals/ Pain       Pain Assessment Pain Assessment: No/denies pain  Home Living                                          Prior Functioning/Environment              Frequency  Min 2X/week        Progress Toward Goals  OT Goals(current goals can now be found in the care plan section)  Progress towards OT goals: Progressing toward goals     Plan      Co-evaluation                 AM-PAC OT 6 Clicks Daily Activity     Outcome Measure   Help from another [place] eating meals?: None Help from another [place] taking care of personal grooming?: None Help from another [place] toileting, which includes using toliet, bedpan, or urinal?: A Little Help from another [place] bathing (including washing,  rinsing, drying)?: A Little Help from another [place] to put on and taking off regular upper body clothing?: None Help from another [place] to put on and taking off regular lower body clothing?: A Little 6 Click Score: 21    End of Session    OT Visit Diagnosis: Other (comment) (decreased activity tolerance)   Activity Tolerance Patient tolerated treatment well;Treatment limited secondary to medical complications (Comment);Patient limited by fatigue (Pt limited by feeling SOB)   Patient Left in chair;with call bell/phone within reach   Nurse Communication Mobility status        Time: 8346-8276 OT Time Calculation (min): 30 min  Charges: OT General Charges $OT Visit: 1 Visit OT Treatments $Therapeutic Exercise: 23-37 mins  Margarie Rockey HERO., OTR/L, MA Acute Rehab [phone]   Margarie FORBES Horns 09/08/2024, 6:15  PM

## 2024-09-08 NOTE — Progress Notes (Signed)
 PROGRESS NOTE    [name] [name]  FMW:[id number] DOB: 01-04-1951 DOA: 09/04/2024 [facility]CP: [facility]   Chief Complaint  Patient presents with   Abdominal Pain   Shortness of Breath    Brief Narrative:    [name] [name] is a 73 y.o. male with PMH of COPD/chronic hypoxic hypercapnic RF on 2 to 3 L and BiPAP but not compliant with BiPAP, PHTN, lung cancer s/p left pneumonectomy, BPH, GERD and HTN presenting with shortness of breath for about a week with dry cough and abdominal pain.   Patient is sleepy but wakes to voice.  He is oriented x 4.   Reports difficulty breathing for several weeks.  He also reports some dry cough.  Not compliant with BiPAP.  Uses his nebulizer but not consistently.  Also not sure how much he is getting out of it since he mostly tends to fall asleep before he even tries to put it on.  He is followed by pulmonology and missed his last appointment 4 days ago.  Denies chest pain, fever, runny nose, sore throat, nausea, vomiting.  Abdominal pain is central and diffuse.  Not able to describe.  Pain is not severe.  He denies diarrhea or constipation.  Denies UTI symptoms but chronic urinary hesitancy likely from BPH.  He denies burning with urination, frequency or urgency.     Patient denies smoking cigarettes, drinking alcohol  or recreational drug use.  He is not interested in cardiopulmonary resuscitation in an event of sudden cardiopulmonary arrest.  He states I do not want people pumping on my chest.   In ED, stable vitals.  VBG suggests chronic respiratory acidosis.  BMP bicarb 42.  Hgb 10.5.  D-dimer 2.0.  CXR and CT angio chest, abdomen and pelvis without acute finding.  UA with many bacteria.  BNP within normal.  Received Solu-Medrol , ceftriaxone , Zithromax , nebulizers.    Assessment & Plan:   Principal Problem:   COPD exacerbation (HCC) Active Problems:   COPD (chronic obstructive pulmonary disease) (HCC)   COPD with acute exacerbation Chronic  hypoxic and hypercapnic respiratory failure History of lung cancer s/p left pneumonectomy -Presents with dyspnea, increased work of breathing, cough. -Compliant with BiPAP. -Workup significant for hypercapnia, but appears with chronic respiratory acidosis -Improved with BiPAP. -Given cough with phlegm, continue with IV antibiotics -Continue with IV steroids, transition to p.o. steroids -Per RT, refusing DuoNeb and asking for home nebulizers. Ordered Brovana , Pulmicort  and Yupelri  and changed DuoNebs to as needed. -Continue Solu-Medrol , Zithromax .  Will change to p.o. prednisone  in a.m. -BiPAP as needed. -DNR/DNI. -PCCM input greatly appreciated -Start low-dose Lasix , I will hold today given contraction alkalosis, will start on acetazolamide  x 24 hours  Pulmonary hypertension - Resume home meds after med rec.   Essential hypertension:  - Resume home meds after med rec as appropriate - P.o. hydralazine  as needed   Elevated D-dimer -CT angio chest negative for PE.     BPH with urinary hesitancy - Start Flomax .   E. coli UTI -Continue   Abdominal pain: Symptoms are resolved.  Abdominal exam benign.  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  Started on Ensure    DVT prophylaxis: (Lovenox ) Code Status: (DNR) Family Communication: Discussed with patient Disposition:   Status is: Inpatient    Consultants:  PCCM  Subjective: Dyspnea and cough has improved, did wear his BiPAP overnight  Objective: Vitals:   09/08/24  0105 09/08/24 0449 09/08/24 0757 09/08/24 1101  BP:  126/81 108/71   Pulse: 86 84 75   Resp: 18 19 18    Temp:  98.1 F (36.7 C)    TempSrc:  Oral Oral   SpO2: 96%  95% 95%  Weight:      Height:        Intake/Output Summary (Last 24 hours) at 09/08/2024 1124 Last data filed at 09/08/2024 0451 Gross per 24 hour  Intake --  Output 1300 ml  Net -1300  ml   Filed Weights   09/04/24 2047  Weight: 55.3 kg    Examination:  Awake Alert, Oriented X 3, well-appearing Symmetrical Chest wall movement, wheezing improved today Regular rate and rhythm Abdomen soft No cyanosis, +1 edema      Data Reviewed: I have personally reviewed following labs and imaging studies  CBC: Recent Labs  Lab 09/04/24 2103 09/05/24 0152 09/05/24 0504 09/06/24 0427 09/07/24 0250 09/08/24 0326  WBC 5.0  --  6.1 3.7* 5.4 7.0  HGB 10.5*  12.2* 11.9* 10.3* 10.1* 9.5* 9.3*  HCT 36.5*  36.0* 35.0* 35.1* 32.9* 31.5* 30.5*  MCV 98.6  --  98.6 93.2 94.0 95.3  PLT 139*  --  148* 146* 141* 144*    Basic Metabolic Panel: Recent Labs  Lab 09/04/24 2103 09/05/24 0152 09/05/24 0504 09/06/24 0427 09/07/24 0250 09/08/24 0326  NA 139  137 136  --  137 135 137  K 4.5  4.5 4.5  --  4.6 4.7 3.9  CL 86*  85*  --   --  85* 87* 83*  CO2 42*  --   --  41* 42* >45*  GLUCOSE 83  83  --   --  109* 192* 109*  BUN 16  19  --   --  15 23 24*  CREATININE 0.64  0.90  --  0.65 0.69 0.75 0.71  CALCIUM 9.4  --   --  9.4 9.2 9.1  MG  --   --   --  1.9 1.9  --   PHOS  --   --   --  3.6 3.4  --     GFR: Estimated Creatinine Clearance: 67.2 mL/min (by C-G formula based on SCr of 0.71 mg/dL).  Liver Function Tests: Recent Labs  Lab 09/05/24 0147 09/06/24 0427  AST 25  --   ALT 19  --   ALKPHOS 62  --   BILITOT 0.2  --   PROT 6.6  --   ALBUMIN  3.7 3.4*    CBG: Recent Labs  Lab 09/07/24 1226  GLUCAP 147*     Recent Results (from the past 240 hours)  Urine Culture     Status: Abnormal   Collection Time: 09/04/24  9:45 PM   Specimen: Urine, Clean Catch  Result Value Ref Range Status   Specimen Description URINE, CLEAN CATCH  Final   Special Requests   Final    NONE Performed at [facility], [address] [place], [place] [address]    Culture >=100,000 COLONIES/mL ESCHERICHIA COLI (A)  Final   Report Status 09/07/2024 FINAL  Final    Organism ID, Bacteria ESCHERICHIA COLI (A)  Final      Susceptibility   Escherichia coli - MIC*    AMPICILLIN >=32 RESISTANT Resistant     CEFAZOLIN (URINE) Value in next row Sensitive      4 SENSITIVEThis is a modified FDA-approved test that has been validated and its performance  characteristics determined by the reporting laboratory.  This laboratory is certified under the Clinical Laboratory Improvement Amendments CLIA as qualified to perform high complexity clinical laboratory testing.    CEFEPIME Value in next row Sensitive      4 SENSITIVEThis is a modified FDA-approved test that has been validated and its performance characteristics determined by the reporting laboratory.  This laboratory is certified under the Clinical Laboratory Improvement Amendments CLIA as qualified to perform high complexity clinical laboratory testing.    ERTAPENEM Value in next row Sensitive      4 SENSITIVEThis is a modified FDA-approved test that has been validated and its performance characteristics determined by the reporting laboratory.  This laboratory is certified under the Clinical Laboratory Improvement Amendments CLIA as qualified to perform high complexity clinical laboratory testing.    CEFTRIAXONE  Value in next row Sensitive      4 SENSITIVEThis is a modified FDA-approved test that has been validated and its performance characteristics determined by the reporting laboratory.  This laboratory is certified under the Clinical Laboratory Improvement Amendments CLIA as qualified to perform high complexity clinical laboratory testing.    CIPROFLOXACIN Value in next row Sensitive      4 SENSITIVEThis is a modified FDA-approved test that has been validated and its performance characteristics determined by the reporting laboratory.  This laboratory is certified under the Clinical Laboratory Improvement Amendments CLIA as qualified to perform high complexity clinical laboratory testing.    GENTAMICIN Value in next row  Sensitive      4 SENSITIVEThis is a modified FDA-approved test that has been validated and its performance characteristics determined by the reporting laboratory.  This laboratory is certified under the Clinical Laboratory Improvement Amendments CLIA as qualified to perform high complexity clinical laboratory testing.    NITROFURANTOIN Value in next row Sensitive      4 SENSITIVEThis is a modified FDA-approved test that has been validated and its performance characteristics determined by the reporting laboratory.  This laboratory is certified under the Clinical Laboratory Improvement Amendments CLIA as qualified to perform high complexity clinical laboratory testing.    TRIMETH /SULFA  Value in next row Sensitive      4 SENSITIVEThis is a modified FDA-approved test that has been validated and its performance characteristics determined by the reporting laboratory.  This laboratory is certified under the Clinical Laboratory Improvement Amendments CLIA as qualified to perform high complexity clinical laboratory testing.    AMPICILLIN/SULBACTAM Value in next row Intermediate      4 SENSITIVEThis is a modified FDA-approved test that has been validated and its performance characteristics determined by the reporting laboratory.  This laboratory is certified under the Clinical Laboratory Improvement Amendments CLIA as qualified to perform high complexity clinical laboratory testing.    PIP/TAZO Value in next row Sensitive      <=4 SENSITIVEThis is a modified FDA-approved test that has been validated and its performance characteristics determined by the reporting laboratory.  This laboratory is certified under the Clinical Laboratory Improvement Amendments CLIA as qualified to perform high complexity clinical laboratory testing.    MEROPENEM Value in next row Sensitive      <=4 SENSITIVEThis is a modified FDA-approved test that has been validated and its performance characteristics determined by the reporting  laboratory.  This laboratory is certified under the Clinical Laboratory Improvement Amendments CLIA as qualified to perform high complexity clinical laboratory testing.    * >=100,000 COLONIES/mL ESCHERICHIA COLI         Radiology Studies: No results found.  Scheduled Meds:  acetaZOLAMIDE   250 mg Oral TID   arformoterol   15 mcg Nebulization BID   azithromycin   250 mg Oral Daily   budesonide  (PULMICORT ) nebulizer solution  0.5 mg Nebulization BID   carvedilol   3.125 mg Oral BID   enoxaparin  (LOVENOX ) injection  40 mg Subcutaneous Q24H   feeding supplement  237 mL Oral BID BM   pantoprazole   40 mg Oral Daily   predniSONE   40 mg Oral Q breakfast   revefenacin   175 mcg Nebulization Daily   tamsulosin   0.4 mg Oral Daily   Continuous Infusions:     LOS: 3 days       Brayton Lye, MD [facility]   To contact the attending provider between 7A-7P or the covering provider during after hours 7P-7A, please log into the web site [email or web address] and access using universal [place] password for that web site. If you do not have the password, please call the hospital operator.  09/08/2024, 11:24 AM

## 2024-09-08 NOTE — Telephone Encounter (Signed)
 PT has been scheduled.

## 2024-09-09 DIAGNOSIS — J441 Chronic obstructive pulmonary disease with (acute) exacerbation: Secondary | ICD-10-CM | POA: Diagnosis not present

## 2024-09-09 DIAGNOSIS — Z23 Encounter for immunization: Secondary | ICD-10-CM | POA: Diagnosis not present

## 2024-09-09 DIAGNOSIS — J9622 Acute and chronic respiratory failure with hypercapnia: Secondary | ICD-10-CM | POA: Diagnosis not present

## 2024-09-09 DIAGNOSIS — Z66 Do not resuscitate: Secondary | ICD-10-CM | POA: Diagnosis not present

## 2024-09-09 DIAGNOSIS — E43 Unspecified severe protein-calorie malnutrition: Secondary | ICD-10-CM | POA: Diagnosis not present

## 2024-09-09 LAB — BLOOD GAS, VENOUS
Acid-Base Excess: 19 mmol/L — ABNORMAL HIGH (ref 0.0–2.0)
Bicarbonate: 49.2 mmol/L — ABNORMAL HIGH (ref 20.0–28.0)
O2 Saturation: 45.9 %
Patient temperature: 36.5
pCO2, Ven: 98 mmHg (ref 44–60)
pH, Ven: 7.31 (ref 7.25–7.43)
pO2, Ven: <31 mmHg — CL (ref 32–45)

## 2024-09-09 LAB — BLOOD GAS, ARTERIAL
Acid-Base Excess: 20.4 mmol/L — ABNORMAL HIGH (ref 0.0–2.0)
Bicarbonate: 54.4 mmol/L — ABNORMAL HIGH (ref 20.0–28.0)
Drawn by: 51185
O2 Saturation: 99 %
Patient temperature: 36.4
pCO2 arterial: >123 mmHg (ref 32–48)
pH, Arterial: 7.25 — ABNORMAL LOW (ref 7.35–7.45)
pO2, Arterial: 119 mmHg — ABNORMAL HIGH (ref 83–108)

## 2024-09-09 LAB — BASIC METABOLIC PANEL WITH GFR
Anion gap: 4 — ABNORMAL LOW (ref 5–15)
BUN: 24 mg/dL — ABNORMAL HIGH (ref 8–23)
CO2: 43 mmol/L — ABNORMAL HIGH (ref 22–32)
Calcium: 9.2 mg/dL (ref 8.9–10.3)
Chloride: 90 mmol/L — ABNORMAL LOW (ref 98–111)
Creatinine, Ser: 0.87 mg/dL (ref 0.61–1.24)
GFR, Estimated: >60 mL/min (ref >60)
Glucose, Bld: 158 mg/dL — ABNORMAL HIGH (ref 70–99)
Potassium: 4.6 mmol/L (ref 3.5–5.1)
Sodium: 137 mmol/L (ref 135–145)

## 2024-09-09 LAB — CBC
HCT: 33.5 % — ABNORMAL LOW (ref 39.0–52.0)
Hemoglobin: 9.8 g/dL — ABNORMAL LOW (ref 13.0–17.0)
MCH: 28.5 pg (ref 26.0–34.0)
MCHC: 29.3 g/dL — ABNORMAL LOW (ref 30.0–36.0)
MCV: 97.4 fL (ref 80.0–100.0)
Platelets: 156 K/uL (ref 150–400)
RBC: 3.44 MIL/uL — ABNORMAL LOW (ref 4.22–5.81)
RDW: 11.9 % (ref 11.5–15.5)
WBC: 5.6 K/uL (ref 4.0–10.5)
nRBC: 0 % (ref 0.0–0.2)

## 2024-09-09 LAB — TROPONIN I (HIGH SENSITIVITY)
Troponin I (High Sensitivity): 9 ng/L (ref <18)
Troponin I (High Sensitivity): 6 ng/L (ref <18)

## 2024-09-09 MED ORDER — LACTATED RINGERS IV BOLUS
500.0000 mL | Freq: Once | INTRAVENOUS | Status: AC
  Administered 2024-09-09: 500 mL via INTRAVENOUS

## 2024-09-09 MED ORDER — FUROSEMIDE 10 MG/ML IJ SOLN
40.0000 mg | Freq: Once | INTRAMUSCULAR | Status: AC
  Administered 2024-09-09: 40 mg via INTRAVENOUS
  Filled 2024-09-09: qty 4

## 2024-09-09 MED ORDER — MIDODRINE HCL 5 MG PO TABS
10.0000 mg | ORAL_TABLET | Freq: Three times a day (TID) | ORAL | Status: DC
  Administered 2024-09-10 – 2024-09-13 (×3): 10 mg via ORAL
  Filled 2024-09-09 (×9): qty 2

## 2024-09-09 MED ORDER — SODIUM CHLORIDE 0.9 % IV SOLN
250.0000 mg | INTRAVENOUS | Status: AC
  Administered 2024-09-09: 250 mg via INTRAVENOUS
  Filled 2024-09-09: qty 250

## 2024-09-09 MED ORDER — ALBUMIN HUMAN 25 % IV SOLN
50.0000 g | Freq: Once | INTRAVENOUS | Status: AC
  Administered 2024-09-09: 50 g via INTRAVENOUS
  Filled 2024-09-09: qty 200

## 2024-09-09 MED ORDER — SODIUM CHLORIDE 0.9 % IV BOLUS: 500.0000 mL | Freq: Once | INTRAVENOUS | Status: DC

## 2024-09-09 MED ORDER — ACETAZOLAMIDE 250 MG PO TABS
250.0000 mg | ORAL_TABLET | Freq: Three times a day (TID) | ORAL | Status: AC
  Administered 2024-09-09: 250 mg via ORAL
  Filled 2024-09-09 (×3): qty 1

## 2024-09-09 MED ORDER — IPRATROPIUM-ALBUTEROL 0.5-2.5 (3) MG/3ML IN SOLN
3.0000 mL | RESPIRATORY_TRACT | Status: AC
Start: 2024-09-09 — End: 2024-09-09
  Administered 2024-09-09: 3 mL via RESPIRATORY_TRACT
  Filled 2024-09-09: qty 3

## 2024-09-09 NOTE — Care Management (Signed)
 10:45 Spoke w RT at bedside after home BiPAP was placed on patient. Patient oriented but somnolent. Will assess later for home health needs.  Per chart review patient has home oxygen at 3L, no HH Hx noted in [place]

## 2024-09-09 NOTE — Plan of Care (Signed)
   Problem: Education: Goal: Knowledge of General Education information will improve Description: Including pain rating scale, medication(s)/side effects and non-pharmacologic comfort measures Outcome: Progressing   Problem: Health Behavior/Discharge Planning: Goal: Ability to manage health-related needs will improve Outcome: Progressing   Problem: Clinical Measurements: Goal: Will remain free from infection Outcome: Progressing Goal: Diagnostic test results will improve Outcome: Progressing Goal: Respiratory complications will improve Outcome: Progressing Goal: Cardiovascular complication will be avoided Outcome: Progressing   Problem: Activity: Goal: Risk for activity intolerance will decrease Outcome: Progressing

## 2024-09-09 NOTE — Progress Notes (Signed)
 PT Cancellation Note  Patient Details Name: [name] [name] MRN: [id number] DOB: [birth year]   Cancelled Treatment:    Reason Eval/Treat Not Completed: Medical issues which prohibited therapy (Pt on bipap. Will follow up later if time allows.)   Maebelle Sulton 09/09/2024, 1:50 PM

## 2024-09-09 NOTE — Progress Notes (Signed)
 Spoke to daughter (Tammy & POA) at shift change. Pt's daughter would like to have the doctor provide an update of patients well being and POC for discharge. Pt's daughter's cell # is [phone] Daughter states  where I work there' no cell phone reception and would like the MD to text, if possible, until she has cell coverage.

## 2024-09-09 NOTE — Progress Notes (Signed)
 PROGRESS NOTE    [name] [name]  FMW:[id number] DOB: [birth year] DOA: 09/04/2024 PCP: [facility]   Chief Complaint  Patient presents with   Abdominal Pain   Shortness of Breath    Brief Narrative:    [name] [name] is a 73 y.o. male with PMH of COPD/chronic hypoxic hypercapnic RF on 2 to 3 L and BiPAP but not compliant with BiPAP, PHTN, lung cancer s/p left pneumonectomy, BPH, GERD and HTN presenting with shortness of breath for about a week with dry cough and abdominal pain.   Patient is sleepy but wakes to voice.  He is oriented x 4.   Reports difficulty breathing for several weeks.  He also reports some dry cough.  Not compliant with BiPAP.  Uses his nebulizer but not consistently.  Also not sure how much he is getting out of it since he mostly tends to fall asleep before he even tries to put it on.  He is followed by pulmonology and missed his last appointment 4 days ago.  Denies chest pain, fever, runny nose, sore throat, nausea, vomiting.  Abdominal pain is central and diffuse.  Not able to describe.  Pain is not severe.  He denies diarrhea or constipation.  Denies UTI symptoms but chronic urinary hesitancy likely from BPH.  He denies burning with urination, frequency or urgency.     Patient denies smoking cigarettes, drinking alcohol  or recreational drug use.  He is not interested in cardiopulmonary resuscitation in an event of sudden cardiopulmonary arrest.  He states I do not want people pumping on my chest.   In ED, stable vitals.  VBG suggests chronic respiratory acidosis.  BMP bicarb 42.  Hgb 10.5.  D-dimer 2.0.  CXR and CT angio chest, abdomen and pelvis without acute finding.  UA with many bacteria.  BNP within normal.  Received Solu-Medrol , ceftriaxone , Zithromax , nebulizers.    Assessment & Plan:   Principal Problem:   COPD exacerbation (HCC) Active Problems:   COPD (chronic obstructive pulmonary disease) (HCC)   COPD with acute exacerbation Chronic  hypoxic and hypercapnic respiratory failure History of lung cancer s/p left pneumonectomy -Presents with dyspnea, increased work of breathing, cough. -Workup significant for hypercapnia, but appears with chronic respiratory acidosis - He is noncompliant with BiPAP, improved with BiPAP. -Given cough with phlegm, continue with IV antibiotics -Continue with IV steroids, transition to p.o. steroids -DNR/DNI. -PCCM input greatly appreciated - Holding diuresis due to low blood pressure and contraction alkalosis, will give acetazolamide  today. - This morning patient appears to be with significant somnolence, it does appear he did not use BiPAP overnight, ABG showing respiratory acidosis with pCO2 of 123, back on BiPAP, mentation has improved, continue with IV Solu-Medrol , scheduled DuoNeb.  Pulmonary hypertension - Resume home meds after med rec.   Essential hypertension/hypotension - Resume home meds after med rec as appropriate - P.o. hydralazine  as needed - Blood pressure is low this morning, required fluid bolus, will start on midodrine  hold on further diuresis    Elevated D-dimer -CT angio chest negative for PE.     BPH with urinary hesitancy - Start Flomax .   E. coli UTI -Continue   Abdominal pain: Symptoms are resolved.  Abdominal exam benign.  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  Started on Ensure    DVT prophylaxis: (Lovenox ) Code Status: (DNR) Family Communication: Discussed with patient Disposition:   Status is: Inpatient  Consultants:  PCCM  Subjective: Patient somnolent this morning, more awake and arousable after continuous BiPAP use this morning  Objective: Vitals:   09/09/24 1153 09/09/24 1224 09/09/24 1300 09/09/24 1318  BP: (!) 67/44  (!) 77/60 (!) 81/64  Pulse: 86 79  72  Resp:  20    Temp:      TempSrc:      SpO2: 93% 100%  (!) 87%   Weight:      Height:        Intake/Output Summary (Last 24 hours) at 09/09/2024 1413 Last data filed at 09/09/2024 1102 Gross per 24 hour  Intake --  Output 2300 ml  Net -2300 ml   Filed Weights   09/04/24 2047  Weight: 55.3 kg    Examination:  Somnolent, open eyes, go back to sleep, this has improved he is currently communicative on BiPAP Decreased air entry bilaterally with wheezing Regular rate and rhythm Abdomen soft No cyanosis, +1 edema      Data Reviewed: I have personally reviewed following labs and imaging studies  CBC: Recent Labs  Lab 09/05/24 0504 09/06/24 0427 09/07/24 0250 09/08/24 0326 09/09/24 0214  WBC 6.1 3.7* 5.4 7.0 5.6  HGB 10.3* 10.1* 9.5* 9.3* 9.8*  HCT 35.1* 32.9* 31.5* 30.5* 33.5*  MCV 98.6 93.2 94.0 95.3 97.4  PLT 148* 146* 141* 144* 156    Basic Metabolic Panel: Recent Labs  Lab 09/04/24 2103 09/05/24 0152 09/05/24 0504 09/06/24 0427 09/07/24 0250 09/08/24 0326 09/09/24 0214  NA 139  137 136  --  137 135 137 137  K 4.5  4.5 4.5  --  4.6 4.7 3.9 4.6  CL 86*  85*  --   --  85* 87* 83* 90*  CO2 42*  --   --  41* 42* >45* 43*  GLUCOSE 83  83  --   --  109* 192* 109* 158*  BUN 16  19  --   --  15 23 24* 24*  CREATININE 0.64  0.90  --  0.65 0.69 0.75 0.71 0.87  CALCIUM 9.4  --   --  9.4 9.2 9.1 9.2  MG  --   --   --  1.9 1.9  --   --   PHOS  --   --   --  3.6 3.4  --   --     GFR: Estimated Creatinine Clearance: 61.8 mL/min (by C-G formula based on SCr of 0.87 mg/dL).  Liver Function Tests: Recent Labs  Lab 09/05/24 0147 09/06/24 0427  AST 25  --   ALT 19  --   ALKPHOS 62  --   BILITOT 0.2  --   PROT 6.6  --   ALBUMIN  3.7 3.4*    CBG: Recent Labs  Lab 09/07/24 1226  GLUCAP 147*     Recent Results (from the past 240 hours)  Urine Culture     Status: Abnormal   Collection Time: 09/04/24  9:45 PM   Specimen: Urine, Clean Catch  Result Value Ref Range Status   Specimen Description URINE, CLEAN CATCH   Final   Special Requests   Final    NONE Performed at [facility], [address] [place], [place] [address]    Culture >=100,000 COLONIES/mL ESCHERICHIA COLI (A)  Final   Report Status 09/07/2024 FINAL  Final   Organism ID, Bacteria ESCHERICHIA COLI (A)  Final      Susceptibility   Escherichia coli - MIC*  AMPICILLIN >=32 RESISTANT Resistant     CEFAZOLIN (URINE) Value in next row Sensitive      4 SENSITIVEThis is a modified FDA-approved test that has been validated and its performance characteristics determined by the reporting laboratory.  This laboratory is certified under the Clinical Laboratory Improvement Amendments CLIA as qualified to perform high complexity clinical laboratory testing.    CEFEPIME Value in next row Sensitive      4 SENSITIVEThis is a modified FDA-approved test that has been validated and its performance characteristics determined by the reporting laboratory.  This laboratory is certified under the Clinical Laboratory Improvement Amendments CLIA as qualified to perform high complexity clinical laboratory testing.    ERTAPENEM Value in next row Sensitive      4 SENSITIVEThis is a modified FDA-approved test that has been validated and its performance characteristics determined by the reporting laboratory.  This laboratory is certified under the Clinical Laboratory Improvement Amendments CLIA as qualified to perform high complexity clinical laboratory testing.    CEFTRIAXONE  Value in next row Sensitive      4 SENSITIVEThis is a modified FDA-approved test that has been validated and its performance characteristics determined by the reporting laboratory.  This laboratory is certified under the Clinical Laboratory Improvement Amendments CLIA as qualified to perform high complexity clinical laboratory testing.    CIPROFLOXACIN Value in next row Sensitive      4 SENSITIVEThis is a modified FDA-approved test that has been validated and its performance characteristics  determined by the reporting laboratory.  This laboratory is certified under the Clinical Laboratory Improvement Amendments CLIA as qualified to perform high complexity clinical laboratory testing.    GENTAMICIN Value in next row Sensitive      4 SENSITIVEThis is a modified FDA-approved test that has been validated and its performance characteristics determined by the reporting laboratory.  This laboratory is certified under the Clinical Laboratory Improvement Amendments CLIA as qualified to perform high complexity clinical laboratory testing.    NITROFURANTOIN Value in next row Sensitive      4 SENSITIVEThis is a modified FDA-approved test that has been validated and its performance characteristics determined by the reporting laboratory.  This laboratory is certified under the Clinical Laboratory Improvement Amendments CLIA as qualified to perform high complexity clinical laboratory testing.    TRIMETH /SULFA  Value in next row Sensitive      4 SENSITIVEThis is a modified FDA-approved test that has been validated and its performance characteristics determined by the reporting laboratory.  This laboratory is certified under the Clinical Laboratory Improvement Amendments CLIA as qualified to perform high complexity clinical laboratory testing.    AMPICILLIN/SULBACTAM Value in next row Intermediate      4 SENSITIVEThis is a modified FDA-approved test that has been validated and its performance characteristics determined by the reporting laboratory.  This laboratory is certified under the Clinical Laboratory Improvement Amendments CLIA as qualified to perform high complexity clinical laboratory testing.    PIP/TAZO Value in next row Sensitive      <=4 SENSITIVEThis is a modified FDA-approved test that has been validated and its performance characteristics determined by the reporting laboratory.  This laboratory is certified under the Clinical Laboratory Improvement Amendments CLIA as qualified to perform high  complexity clinical laboratory testing.    MEROPENEM Value in next row Sensitive      <=4 SENSITIVEThis is a modified FDA-approved test that has been validated and its performance characteristics determined by the reporting laboratory.  This laboratory is certified under the Clinical  Laboratory Improvement Amendments CLIA as qualified to perform high complexity clinical laboratory testing.    * >=100,000 COLONIES/mL ESCHERICHIA COLI         Radiology Studies: No results found.       Scheduled Meds:  acetaZOLAMIDE   250 mg Oral TID   arformoterol   15 mcg Nebulization BID   azithromycin   250 mg Oral Daily   budesonide  (PULMICORT ) nebulizer solution  0.5 mg Nebulization BID   carvedilol   3.125 mg Oral BID   enoxaparin  (LOVENOX ) injection  40 mg Subcutaneous Q24H   feeding supplement  237 mL Oral BID BM   midodrine   10 mg Oral TID WC   pantoprazole   40 mg Oral Daily   predniSONE   40 mg Oral Q breakfast   revefenacin   175 mcg Nebulization Daily   tamsulosin   0.4 mg Oral Daily   Continuous Infusions:  cefTRIAXone  (ROCEPHIN )  IV 1 g (09/08/24 1241)      LOS: 4 days       Brayton Lye, MD [facility]   To contact the attending provider between 7A-7P or the covering provider during after hours 7P-7A, please log into the web site [email or web address] and access using universal [place] password for that web site. If you do not have the password, please call the hospital operator.  09/09/2024, 2:13 PM

## 2024-09-10 ENCOUNTER — Inpatient Hospital Stay (HOSPITAL_COMMUNITY)

## 2024-09-10 DIAGNOSIS — M7989 Other specified soft tissue disorders: Secondary | ICD-10-CM

## 2024-09-10 DIAGNOSIS — J441 Chronic obstructive pulmonary disease with (acute) exacerbation: Secondary | ICD-10-CM | POA: Diagnosis not present

## 2024-09-10 DIAGNOSIS — N390 Urinary tract infection, site not specified: Secondary | ICD-10-CM | POA: Diagnosis not present

## 2024-09-10 LAB — CBC
HCT: 27.8 % — ABNORMAL LOW (ref 39.0–52.0)
Hemoglobin: 8.6 g/dL — ABNORMAL LOW (ref 13.0–17.0)
MCH: 28.7 pg (ref 26.0–34.0)
MCHC: 30.9 g/dL (ref 30.0–36.0)
MCV: 92.7 fL (ref 80.0–100.0)
Platelets: 147 K/uL — ABNORMAL LOW (ref 150–400)
RBC: 3 MIL/uL — ABNORMAL LOW (ref 4.22–5.81)
RDW: 12 % (ref 11.5–15.5)
WBC: 2.8 K/uL — ABNORMAL LOW (ref 4.0–10.5)
nRBC: 0 % (ref 0.0–0.2)

## 2024-09-10 LAB — BASIC METABOLIC PANEL WITH GFR
Anion gap: 11 (ref 5–15)
BUN: 28 mg/dL — ABNORMAL HIGH (ref 8–23)
CO2: 35 mmol/L — ABNORMAL HIGH (ref 22–32)
Calcium: 9.5 mg/dL (ref 8.9–10.3)
Chloride: 91 mmol/L — ABNORMAL LOW (ref 98–111)
Creatinine, Ser: 0.83 mg/dL (ref 0.61–1.24)
GFR, Estimated: >60 mL/min (ref >60)
Glucose, Bld: 114 mg/dL — ABNORMAL HIGH (ref 70–99)
Potassium: 4.2 mmol/L (ref 3.5–5.1)
Sodium: 137 mmol/L (ref 135–145)

## 2024-09-10 MED ORDER — SIMVASTATIN 20 MG PO TABS
10.0000 mg | ORAL_TABLET | Freq: Every day | ORAL | Status: DC
Start: 2024-09-10 — End: 2024-09-13
  Administered 2024-09-10 – 2024-09-12 (×3): 10 mg via ORAL
  Filled 2024-09-10 (×3): qty 1

## 2024-09-10 NOTE — Progress Notes (Signed)
 Physical Therapy Treatment Patient Details Name: Zeric Baranowski MRN: [id number] DOB: 08-03-1951 Today's Date: 09/10/2024   History of Present Illness Greco Goris is a 73 y.o. male presenting with shortness of breath for about a week with dry cough and abdominal pain. Found to have COPD exacerbation. PMH of COPD/chronic hypoxic hypercapnic RF on 2 to 3 L and BiPAP but not compliant with BiPAP, PHTN, lung cancer s/p left pneumonectomy, BPH, GERD and HTN.    PT Comments  Pt tolerated treatment well today. Co-treat with OT. Pt today was able to stand at sink for ~15 minutes to perform ADLs with OT then ambulate down hallway with no AD at supervision level. No change in DC/DME recs at this time. PT will continue to follow.     If plan is discharge home, recommend the following: A little help with walking and/or transfers;A little help with bathing/dressing/bathroom;Assistance with cooking/housework;Direct supervision/assist for medications management;Assist for transportation;Help with stairs or ramp for entrance   Can travel by private vehicle        Equipment Recommendations  None recommended by PT    Recommendations for Other Services       Precautions / Restrictions Precautions Precautions: Fall;Other (comment) Recall of Precautions/Restrictions: Intact Precaution/Restrictions Comments: watch O2 Restrictions Weight Bearing Restrictions Per Provider Order: No     Mobility  Bed Mobility Overal bed mobility: Modified Independent                  Transfers Overall transfer level: Needs assistance Equipment used: None Transfers: Sit to/from Stand Sit to Stand: Supervision                Ambulation/Gait Ambulation/Gait assistance: Supervision Gait Distance (Feet): 100 Feet Assistive device: None Gait Pattern/deviations: Trunk flexed, Decreased stride length, Step-through pattern   Gait velocity interpretation: >2.62 ft/sec, indicative of community ambulatory    General Gait Details: no LOB noted. Good speed. Pt noted to have occasional scissoring however able to self correct.   Stairs             Wheelchair Mobility     Tilt Bed    Modified Rankin (Stroke Patients Only)       Balance Overall balance assessment: No apparent balance deficits (not formally assessed)                                          Communication Communication Communication: No apparent difficulties  Cognition Arousal: Alert Behavior During Therapy: WFL for tasks assessed/performed                             Following commands: Intact      Cueing Cueing Techniques: Verbal cues, Visual cues  Exercises      General Comments General comments (skin integrity, edema, etc.): SpO2 at 99% on 3L      Pertinent Vitals/Pain      Home Living                          Prior Function            PT Goals (current goals can now be found in the care plan section) Progress towards PT goals: Progressing toward goals    Frequency    Min 2X/week      PT Plan  Co-evaluation PT/OT/SLP Co-Evaluation/Treatment: Yes Reason for Co-Treatment: Other (comment) (patient's activity tolerance) PT goals addressed during session: Mobility/safety with mobility OT goals addressed during session: ADL's and self-care      AM-PAC PT 6 Clicks Mobility   Outcome Measure  Help needed turning from your back to your side while in a flat bed without using bedrails?: A Little Help needed moving from lying on your back to sitting on the side of a flat bed without using bedrails?: A Little Help needed moving to and from a bed to a chair (including a wheelchair)?: A Little Help needed standing up from a chair using your arms (e.g., wheelchair or bedside chair)?: A Little Help needed to walk in hospital room?: A Little Help needed climbing 3-5 steps with a railing? : A Little 6 Click Score: 18    End of Session Equipment  Utilized During Treatment: Gait belt;Oxygen Activity Tolerance: Patient tolerated treatment well Patient left: in bed;with call bell/phone within reach Nurse Communication: Mobility status PT Visit Diagnosis: Other abnormalities of gait and mobility (R26.89)     Time: 8853-8789 PT Time Calculation (min) (ACUTE ONLY): 24 min  Charges:    $Gait Training: 8-22 mins PT General Charges $$ ACUTE PT VISIT: 1 Visit                     Sueellen NOVAK, PT, DPT Acute Rehab Services [phone]    Lachina Salsberry 09/10/2024, 4:26 PM

## 2024-09-10 NOTE — Progress Notes (Addendum)
 PROGRESS NOTE        PATIENT DETAILS Name: [name] [name] Age: 73 y.o. Sex: male Date of Birth: 01-28-1951 Admit Date: 09/04/2024 Admitting Physician Rollene GORMAN Hurst, MD ERE:[facility], [facility]  Brief Summary: Patient is a 73 y.o.  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  Significant events: 9/19>> admit to [facility] 9/23>> somnolence with worsening hypercarbia-BiPAP.  Significant studies: 9/19>> CT angio chest: No PE 9/19>> CT abdomen/pelvis: No acute findings.  Significant microbiology data: 9/18>> urine culture: E. coli  Procedures: None  Consults: None  Subjective: Seen earlier this morning-awake/alert-on BiPAP.  Objective: Vitals: Blood pressure 134/84, pulse 71, temperature 97.8 F (36.6 C), temperature source Axillary, resp. rate 18, height 5' 9 (1.753 m), weight 55.3 kg, SpO2 96%.   Exam: Gen Exam:Alert awake-not in any distress HEENT:atraumatic, normocephalic Chest: Moving air-mostly on right lung-no rhonchi. CVS:S1S2 regular Abdomen:soft non tender, non distended Extremities:no edema Neurology: Non focal Skin: no rash  Pertinent Labs/Radiology:    Latest Ref Rng & Units 09/10/2024    3:25 AM 09/09/2024    2:14 AM 09/08/2024    3:26 AM  CBC  WBC 4.0 - 10.5 K/uL 2.8  5.6  7.0   Hemoglobin 13.0 - 17.0 g/dL 8.6  9.8  9.3   Hematocrit 39.0 - 52.0 % 27.8  33.5  30.5   Platelets 150 - 400 K/uL 147  156  144     Lab Results  Component Value Date   NA 137 09/10/2024   K 4.2 09/10/2024   CL 91 (L) 09/10/2024   CO2 35 (H) 09/10/2024      Assessment/Plan: Acute on chronic hypercarbic respiratory failure Secondary COPD exacerbation/noncompliance to. Developed severe hypercarbia yesterday-required BiPAP-much better today-awake/alert this  morning. Discussed with nursing staff-avoid excessive FiO2-okay to keep O2 saturations in high 80s  Mandatory BiPAP at night and when sleeping. Continue to with underlying COPD  COPD exacerbation Improved Moving air well Scheduled bronchodilators Rapid prednisone  taper.  HFpEF Appears euvolemic As needed diuretics.  Minimally elevated D-dimer CTA chest negative for PE Check lower extremity Doppler-complains of intermittent leg pain.  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 coli UTI On Rocephin   Underweight: Estimated body mass index is 18.02 kg/m as calculated from the following:   Height as of this encounter: 5' 9 (1.753 m).   Weight as of this encounter: 55.3 kg.   Code status:   Code Status: Limited: Do not attempt resuscitation (DNR) -DNR-LIMITED -Do Not Intubate/DNI    DVT Prophylaxis: enoxaparin  (LOVENOX ) injection 40 mg Start: 09/05/24 0800   Family Communication: Daughter-Tammy- [phone] called on 9/24-mailbox full-unable to leave VM.   Disposition Plan: Status is: Inpatient Remains inpatient appropriate because: Severity of illness   Planned Discharge Destination:Home health   Diet: Diet Order             Diet regular Room service appropriate? Yes; Fluid consistency: Thin  Diet effective now  Antimicrobial agents: Anti-infectives (From admission, onward)    Start     Dose/Rate Route Frequency Ordered Stop   09/08/24 1215  cefTRIAXone  (ROCEPHIN ) 1 g in sodium chloride  0.9 % 100 mL IVPB        1 g 200 mL/hr over 30 Minutes Intravenous Every 24 hours 09/08/24 1126 09/11/24 1214   09/07/24 1000  azithromycin  (ZITHROMAX ) tablet 250 mg        250 mg Oral Daily 09/06/24 1524      09/05/24 0515  azithromycin  (ZITHROMAX ) 250 mg in dextrose  5 % 125 mL IVPB  Status:  Discontinued        250 mg 127.5 mL/hr over 60 Minutes Intravenous Every 24 hours 09/05/24 0504 09/06/24 1524   09/05/24 0130  cefTRIAXone  (ROCEPHIN ) 1 g in sodium chloride  0.9 % 100 mL IVPB        1 g 200 mL/hr over 30 Minutes Intravenous  Once 09/05/24 0126 09/05/24 0234        MEDICATIONS: Scheduled Meds:  arformoterol   15 mcg Nebulization BID   azithromycin   250 mg Oral Daily   budesonide  (PULMICORT ) nebulizer solution  0.5 mg Nebulization BID   carvedilol   3.125 mg Oral BID   enoxaparin  (LOVENOX ) injection  40 mg Subcutaneous Q24H   feeding supplement  237 mL Oral BID BM   midodrine   10 mg Oral TID WC   pantoprazole   40 mg Oral Daily   predniSONE   40 mg Oral Q breakfast   revefenacin   175 mcg Nebulization Daily   tamsulosin   0.4 mg Oral Daily   Continuous Infusions:  cefTRIAXone  (ROCEPHIN )  IV 1 g (09/09/24 1428)   PRN Meds:.acetaminophen , artificial tears, hydrALAZINE , ipratropium-albuterol , melatonin, polyethylene glycol, prochlorperazine , simethicone    I have personally reviewed following labs and imaging studies  LABORATORY DATA: CBC: Recent Labs  Lab 09/06/24 0427 09/07/24 0250 09/08/24 0326 09/09/24 0214 09/10/24 0325  WBC 3.7* 5.4 7.0 5.6 2.8*  HGB 10.1* 9.5* 9.3* 9.8* 8.6*  HCT 32.9* 31.5* 30.5* 33.5* 27.8*  MCV 93.2 94.0 95.3 97.4 92.7  PLT 146* 141* 144* 156 147*    Basic Metabolic Panel: Recent Labs  Lab 09/06/24 0427 09/07/24 0250 09/08/24 0326 09/09/24 0214 09/10/24 0325  NA 137 135 137 137 137  K 4.6 4.7 3.9 4.6 4.2  CL 85* 87* 83* 90* 91*  CO2 41* 42* >45* 43* 35*  GLUCOSE 109* 192* 109* 158* 114*  BUN 15 23 24* 24* 28*  CREATININE 0.69 0.75 0.71 0.87 0.83  CALCIUM 9.4 9.2 9.1 9.2 9.5  MG 1.9 1.9  --   --   --   PHOS 3.6 3.4  --   --   --     GFR: Estimated Creatinine Clearance: 64.8 mL/min (by C-G formula based on SCr of 0.83 mg/dL).  Liver  Function Tests: Recent Labs  Lab 09/05/24 0147 09/06/24 0427  AST 25  --   ALT 19  --   ALKPHOS 62  --   BILITOT 0.2  --   PROT 6.6  --   ALBUMIN  3.7 3.4*   No results for input(s): LIPASE, AMYLASE in the last 168 hours. No results for input(s): AMMONIA in the last 168 hours.  Coagulation Profile: No results for input(s): INR, PROTIME in the last 168 hours.  Cardiac Enzymes: No results for input(s): CKTOTAL, CKMB, CKMBINDEX, TROPONINI in the last 168 hours.  BNP (last 3 results) No results for input(s): PROBNP in the last 8760 hours.  Lipid Profile: No results  for input(s): CHOL, HDL, LDLCALC, TRIG, CHOLHDL, LDLDIRECT in the last 72 hours.  Thyroid Function Tests: No results for input(s): TSH, T4TOTAL, FREET4, T3FREE, THYROIDAB in the last 72 hours.  Anemia Panel: No results for input(s): VITAMINB12, FOLATE, FERRITIN, TIBC, IRON, RETICCTPCT in the last 72 hours.  Urine analysis:    Component Value Date/Time   COLORURINE YELLOW 09/04/2024 2145   APPEARANCEUR HAZY (A) 09/04/2024 2145   LABSPEC 1.014 09/04/2024 2145   PHURINE 7.0 09/04/2024 2145   GLUCOSEU NEGATIVE 09/04/2024 2145   HGBUR NEGATIVE 09/04/2024 2145   BILIRUBINUR NEGATIVE 09/04/2024 2145   BILIRUBINUR small (A) 09/30/2021 1510   KETONESUR NEGATIVE 09/04/2024 2145   PROTEINUR 30 (A) 09/04/2024 2145   UROBILINOGEN 1.0 09/30/2021 1510   NITRITE NEGATIVE 09/04/2024 2145   LEUKOCYTESUR MODERATE (A) 09/04/2024 2145    Sepsis Labs: Lactic Acid, Venous    Component Value Date/Time   LATICACIDVEN 0.7 08/20/2021 0413    MICROBIOLOGY: Recent Results (from the past 240 hours)  Urine Culture     Status: Abnormal   Collection Time: 09/04/24  9:45 PM   Specimen: Urine, Clean Catch  Result Value Ref Range Status   Specimen Description URINE, CLEAN CATCH  Final   Special Requests   Final    NONE Performed at [facility], [address],  [place], [place] [address]    Culture >=100,000 COLONIES/mL ESCHERICHIA COLI (A)  Final   Report Status 09/07/2024 FINAL  Final   Organism ID, Bacteria ESCHERICHIA COLI (A)  Final      Susceptibility   Escherichia coli - MIC*    AMPICILLIN >=32 RESISTANT Resistant     CEFAZOLIN (URINE) Value in next row Sensitive      4 SENSITIVEThis is a modified FDA-approved test that has been validated and its performance characteristics determined by the reporting laboratory.  This laboratory is certified under the Clinical Laboratory Improvement Amendments CLIA as qualified to perform high complexity clinical laboratory testing.    CEFEPIME Value in next row Sensitive      4 SENSITIVEThis is a modified FDA-approved test that has been validated and its performance characteristics determined by the reporting laboratory.  This laboratory is certified under the Clinical Laboratory Improvement Amendments CLIA as qualified to perform high complexity clinical laboratory testing.    ERTAPENEM Value in next row Sensitive      4 SENSITIVEThis is a modified FDA-approved test that has been validated and its performance characteristics determined by the reporting laboratory.  This laboratory is certified under the Clinical Laboratory Improvement Amendments CLIA as qualified to perform high complexity clinical laboratory testing.    CEFTRIAXONE  Value in next row Sensitive      4 SENSITIVEThis is a modified FDA-approved test that has been validated and its performance characteristics determined by the reporting laboratory.  This laboratory is certified under the Clinical Laboratory Improvement Amendments CLIA as qualified to perform high complexity clinical laboratory testing.    CIPROFLOXACIN Value in next row Sensitive      4 SENSITIVEThis is a modified FDA-approved test that has been validated and its performance characteristics determined by the reporting laboratory.  This laboratory is certified under the Clinical Laboratory  Improvement Amendments CLIA as qualified to perform high complexity clinical laboratory testing.    GENTAMICIN Value in next row Sensitive      4 SENSITIVEThis is a modified FDA-approved test that has been validated and its performance characteristics determined by the reporting laboratory.  This laboratory is certified under  the Clinical Laboratory Improvement Amendments CLIA as qualified to perform high complexity clinical laboratory testing.    NITROFURANTOIN Value in next row Sensitive      4 SENSITIVEThis is a modified FDA-approved test that has been validated and its performance characteristics determined by the reporting laboratory.  This laboratory is certified under the Clinical Laboratory Improvement Amendments CLIA as qualified to perform high complexity clinical laboratory testing.    TRIMETH /SULFA  Value in next row Sensitive      4 SENSITIVEThis is a modified FDA-approved test that has been validated and its performance characteristics determined by the reporting laboratory.  This laboratory is certified under the Clinical Laboratory Improvement Amendments CLIA as qualified to perform high complexity clinical laboratory testing.    AMPICILLIN/SULBACTAM Value in next row Intermediate      4 SENSITIVEThis is a modified FDA-approved test that has been validated and its performance characteristics determined by the reporting laboratory.  This laboratory is certified under the Clinical Laboratory Improvement Amendments CLIA as qualified to perform high complexity clinical laboratory testing.    PIP/TAZO Value in next row Sensitive      <=4 SENSITIVEThis is a modified FDA-approved test that has been validated and its performance characteristics determined by the reporting laboratory.  This laboratory is certified under the Clinical Laboratory Improvement Amendments CLIA as qualified to perform high complexity clinical laboratory testing.    MEROPENEM Value in next row Sensitive      <=4  SENSITIVEThis is a modified FDA-approved test that has been validated and its performance characteristics determined by the reporting laboratory.  This laboratory is certified under the Clinical Laboratory Improvement Amendments CLIA as qualified to perform high complexity clinical laboratory testing.    * >=100,000 COLONIES/mL ESCHERICHIA COLI    RADIOLOGY STUDIES/RESULTS: No results found.   LOS: 5 days   Donalda Applebaum, MD  [facility]    To contact the attending provider between 7A-7P or the covering provider during after hours 7P-7A, please log into the web site [email or web address] and access using universal [place] password for that web site. If you do not have the password, please call the hospital operator.  09/10/2024, 11:47 AM

## 2024-09-10 NOTE — TOC Initial Note (Signed)
 Transition of Care [facility]) - Initial/Assessment Note    Patient Details  Name: [name] [name] MRN: [id number] Date of Birth: Feb 25, 1951  [facility] CM/SW Contact:    Marval Gell, RN Phone Number: 09/10/2024, 11:28 AM  Clinical Narrative:                  Beatris w patient at bedside. Discussed needs for discharge. He states that he has an aid that comes 3 times a week, he is agreeable to [facility] PT OT and has no preference for provider. Referral accepted by [facility]. He states that he has a RW at home and grab bars for toilet. He confirms he has home oxygen and also a home BiPAP (at bedside).  He plans for his daughter to provide transport home.  He gets meds through [facility], but also has medicaid and managed medicare that [facility] pharmacy can use to fill meds at DC if needed.    Expected Discharge Plan: Home w Home Health Services Barriers to Discharge: Continued Medical Work up   Patient Goals and CMS Choice Patient states their goals for this hospitalization and ongoing recovery are:: to go home CMS Medicare.gov Compare Post Acute Care list provided to:: Patient Choice offered to / list presented to : Patient      Expected Discharge Plan and Services   Discharge Planning Services: CM Consult Post Acute Care Choice: Home Health Living arrangements for the past 2 months: Single Family Home                 DME Arranged: N/A         HH Arranged: PT, OT HH Agency: [facility] Date [facility] Agency Contacted: 09/10/24 Time HH Agency Contacted: 1127 Representative spoke with at [facility] Agency: Darleene  Prior Living Arrangements/Services Living arrangements for the past 2 months: Single Family Home Lives with:: Self   Do you feel safe going back to the place where you live?: Yes               Activities of Daily Living   ADL Screening (condition at time of admission) Independently performs ADLs?: Yes (appropriate for developmental age) Is the patient deaf or have  difficulty hearing?: No Does the patient have difficulty seeing, even when wearing glasses/contacts?: Yes Does the patient have difficulty concentrating, remembering, or making decisions?: No  Permission Sought/Granted                  Emotional Assessment              Admission diagnosis:  COPD (chronic obstructive pulmonary disease) (HCC) [J44.9] COPD exacerbation (HCC) [J44.1] Acute UTI [N39.0] Patient Active Problem List   Diagnosis Date Noted   COPD exacerbation (HCC) 09/05/2024   COPD (chronic obstructive pulmonary disease) (HCC) 09/05/2024   Atypical chest pain 05/28/2024   Respiratory failure with hypercapnia (HCC) 05/27/2024   Benign prostatic hyperplasia without lower urinary tract symptoms 01/07/2024   Dysthymia 01/07/2024   Encounter for immunization 01/07/2024   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   Post-traumatic stress disorder, chronic 01/07/2024   Presbyopia 01/07/2024   Type 1 diabetes mellitus with diabetic polyneuropathy (HCC) 01/07/2024   Underweight 01/07/2024   COPD with acute exacerbation (HCC) 07/21/2022   Gastroesophageal reflux disease 07/21/2022   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) 07/21/2022   Epigastric abdominal pain    Centrilobular emphysema (HCC)    Protein-calorie malnutrition, severe 09/14/2021   Pleural effusion 08/20/2021  Chronic respiratory failure with hypoxia and hypercapnia (HCC)    Right-sided heart failure (HCC) 08/19/2021   Pulmonary hypertension, moderate to severe (HCC) 08/18/2021   Shoulder pain 05/25/2020   Irregular heart beats 01/06/2020   Primary hypertension 12/30/2019   Abdominal pain, lower 12/30/2019   Acquired absence of lung (part of) 12/30/2019   Personal history of other malignant neoplasm of bronchus and lung 12/30/2019   Chest pain, pleuritic 02/21/2017   Lung cancer (HCC) 09/02/2013   PCP:  [facility] Pharm[facility]:   [id number] - [place],  [place] - [address] AT [facility] [address] [place] [address] [place] [address] Phone: [phone] Fax: [phone]  [facility] [address] [place] [address] Phone: [phone] Fax: [phone]     Social Drivers of Health (SDOH) Social History: SDOH Screenings   Food Insecurity: Food Insecurity Present (09/05/2024)  Housing: High Risk (09/05/2024)  Transportation Needs: No Transportation Needs (09/05/2024)  Utilities: Not At Risk (09/05/2024)  Depression (PHQ2-9): Low Risk  (08/22/2022)  Social Connections: Moderately Isolated (09/05/2024)  Tobacco Use: Medium Risk (05/27/2024)   SDOH Interventions:     Readmission Risk Interventions     No data to display

## 2024-09-10 NOTE — Plan of Care (Signed)
  Problem: Health Behavior/Discharge Planning: Goal: Ability to manage health-related needs will improve Outcome: Progressing   Problem: Clinical Measurements: Goal: Diagnostic test results will improve Outcome: Progressing Goal: Respiratory complications will improve Outcome: Progressing   

## 2024-09-10 NOTE — Progress Notes (Signed)
 Occupational Therapy Treatment Patient Details Name: [name] [name] MRN: [id number] DOB: Jul 10, 1951 Today's Date: 09/10/2024   History of present illness [name] [name] is a 73 y.o. male presenting with shortness of breath for about a week with dry cough and abdominal pain. Found to have COPD exacerbation. PMH of COPD/chronic hypoxic hypercapnic RF on 2 to 3 L and BiPAP but not compliant with BiPAP, PHTN, lung cancer s/p left pneumonectomy, BPH, GERD and HTN.   OT comments  Patient is making progress toward OT  goals,  Patient agreeable and willing to participate in OT treatment this date.  Patient completed supine to sitting EOB with mod I with HOB elevated. Sit to stand completed with Superversion   Functional mobility in room to sink with Supervision.  Patient completed oral hygiene standing at sink for approx 15 mins with good tolerance and o seated rest breaks with 02 sats 94% on 3 liters O2.  Patient completed functional mobility  with Supervision.  Patient  would benefit from additional OT intervention to address functional deficits in order for patient to return to PLOF        If plan is discharge home, recommend the following:  A little help with walking and/or transfers;A little help with bathing/dressing/bathroom;Assistance with cooking/housework;Direct supervision/assist for medications management;Assist for transportation;Help with stairs or ramp for entrance   Equipment Recommendations  None recommended by OT    Recommendations for Other Services      Precautions / Restrictions Precautions Precautions: Fall;Other (comment) Recall of Precautions/Restrictions: Intact Precaution/Restrictions Comments: watch O2 Restrictions Weight Bearing Restrictions Per Provider Order: No       Mobility Bed Mobility Overal bed mobility: Modified Independent                  Transfers Overall transfer level: Needs assistance Equipment used: None Transfers: Sit to/from Stand Sit  to Stand: Supervision                 Balance Overall balance assessment: No apparent balance deficits (not formally assessed)                                         ADL either performed or assessed with clinical judgement   ADL Overall ADL's : Needs assistance/impaired     Grooming: Wash/dry hands;Wash/dry face;Oral care;Standing (patient  standing at sink for approx 15 mins during grooming tasks with 02 sats 94% with activity on 3 liters O2)           Upper Body Dressing : (S) Supervision/safety;Set up;Standing   Lower Body Dressing: Set up (patient able to stand and thread LEs into pants)               Functional mobility during ADLs: Supervision/safety      Extremity/Trunk Assessment Upper Extremity Assessment Upper Extremity Assessment: Generalized weakness            Vision       Perception     Praxis     Communication Communication Communication: No apparent difficulties   Cognition Arousal: Alert Behavior During Therapy: WFL for tasks assessed/performed Cognition: No apparent impairments             OT - Cognition Comments: AAOx4 and pleasant throughout session with cognition WFL for tasks assessed.                 Following commands: Intact  Cueing      Exercises      Shoulder Instructions       General Comments      Pertinent Vitals/ Pain       Pain Assessment Pain Assessment: No/denies pain  Home Living                                          Prior Functioning/Environment              Frequency  Min 2X/week        Progress Toward Goals  OT Goals(current goals can now be found in the care plan section)  Progress towards OT goals: Progressing toward goals  Acute Rehab OT Goals OT Goal Formulation: With patient Time For Goal Achievement: 09/20/24 Potential to Achieve Goals: Good ADL Goals Pt Will Perform Lower Body Bathing: with modified  independence Pt Will Perform Lower Body Dressing: Independently Pt Will Transfer to Toilet: with modified independence Additional ADL Goal #1: Patient will Independentlyrecall   PLB technique during ADL task  Plan      Co-evaluation    PT/OT/SLP Co-Evaluation/Treatment: Yes Reason for Co-Treatment: Other (comment) (patient's activity tolerance) PT goals addressed during session: Mobility/safety with mobility OT goals addressed during session: ADL's and self-care      AM-PAC OT 6 Clicks Daily Activity     Outcome Measure   Help from another Liguori eating meals?: None Help from another Prinsen taking care of personal grooming?: None Help from another Borgen toileting, which includes using toliet, bedpan, or urinal?: A Little Help from another Pavel bathing (including washing, rinsing, drying)?: A Little Help from another Bowley to put on and taking off regular upper body clothing?: None Help from another Drummer to put on and taking off regular lower body clothing?: A Little 6 Click Score: 21    End of Session        Activity Tolerance Patient tolerated treatment well;Treatment limited secondary to medical complications (Comment);Patient limited by fatigue   Patient Left in bed;with call bell/phone within reach   Nurse Communication Mobility status        Time: 8853-8788 OT Time Calculation (min): 25 min  Charges: OT General Charges $OT Visit: 1 Visit OT Treatments $Self Care/Home Management : 23-37 mins  Lamarr Pouch OT/L  Lamarr JONETTA Pouch 09/10/2024, 2:11 PM

## 2024-09-11 DIAGNOSIS — N390 Urinary tract infection, site not specified: Secondary | ICD-10-CM | POA: Diagnosis not present

## 2024-09-11 DIAGNOSIS — J441 Chronic obstructive pulmonary disease with (acute) exacerbation: Secondary | ICD-10-CM | POA: Diagnosis not present

## 2024-09-11 LAB — BASIC METABOLIC PANEL WITH GFR
Anion gap: 7 (ref 5–15)
BUN: 30 mg/dL — ABNORMAL HIGH (ref 8–23)
CO2: 37 mmol/L — ABNORMAL HIGH (ref 22–32)
Calcium: 9.5 mg/dL (ref 8.9–10.3)
Chloride: 93 mmol/L — ABNORMAL LOW (ref 98–111)
Creatinine, Ser: 0.69 mg/dL (ref 0.61–1.24)
GFR, Estimated: >60 mL/min (ref >60)
Glucose, Bld: 79 mg/dL (ref 70–99)
Potassium: 4.1 mmol/L (ref 3.5–5.1)
Sodium: 137 mmol/L (ref 135–145)

## 2024-09-11 NOTE — Progress Notes (Signed)
 PROGRESS NOTE        PATIENT DETAILS Name: [name] [name] Age: 73 y.o. Sex: male Date of Birth: 1951-08-30 Admit Date: 09/04/2024 Admitting Physician Rollene GORMAN Hurst, MD ERE:[facility], [facility]  Brief Summary: Patient is a 73 y.o.  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  Significant events: 9/19>> admit to [facility] 9/23>> somnolence with worsening hypercarbia-BiPAP.  Significant studies: 9/19>> CT angio chest: No PE 9/19>> CT abdomen/pelvis: No acute findings. 9/24>> B/L LE Doppler: No DVT  Significant microbiology data: 9/18>> urine culture: E. coli  Procedures: None  Consults: None  Subjective: Uneventful night-feels better.  Used BiPAP/CPAP last night.  Objective: Vitals: Blood pressure 129/83, pulse 83, temperature 98.1 F (36.7 C), temperature source Oral, resp. rate 18, height 5' 9 (1.753 m), weight 55.3 kg, SpO2 100%.   Exam: Awake/alert Chest: Clear to auscultation on the right hemithorax CVS: S1-S2 regular Abdomen: Soft nontender nondistended Extremities: No edema Nonfocal exam  Pertinent Labs/Radiology:    Latest Ref Rng & Units 09/10/2024    3:25 AM 09/09/2024    2:14 AM 09/08/2024    3:26 AM  CBC  WBC 4.0 - 10.5 K/uL 2.8  5.6  7.0   Hemoglobin 13.0 - 17.0 g/dL 8.6  9.8  9.3   Hematocrit 39.0 - 52.0 % 27.8  33.5  30.5   Platelets 150 - 400 K/uL 147  156  144     Lab Results  Component Value Date   NA 137 09/11/2024   K 4.1 09/11/2024   CL 93 (L) 09/11/2024   CO2 37 (H) 09/11/2024      Assessment/Plan: Acute on chronic hypercarbic respiratory failure Secondary COPD exacerbation/noncompliance to BiPAP Much better after treatment of underlying etiology and compliance with BiPAP Mandatory BiPAP at night/when sleeping  COPD  exacerbation Chronic hypoxic respiratory failure on home O2 3 L. Overall-moving air well Scheduled bronchodilators Continue to taper prednisone .  HFpEF Appears euvolemic As needed diuretics.  Minimally elevated D-dimer CTA chest negative for PE/Dopplers negative for DVT. Supportive care.  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 coli UTI Completed a course of antibiotics.  Underweight: Estimated body mass index is 18.02 kg/m as calculated from the following:   Height as of this encounter: 5' 9 (1.753 m).   Weight as of this encounter: 55.3 kg.   Code status:   Code Status: Limited: Do not attempt resuscitation (DNR) -DNR-LIMITED -Do Not Intubate/DNI    DVT Prophylaxis: enoxaparin  (LOVENOX ) injection 40 mg Start: 09/05/24 0800   Family Communication: Daughter-Tammy- [phone] called on 9/25   Disposition Plan: Status is: Inpatient Remains inpatient appropriate because: Severity of illness   Planned Discharge Destination:Home health   Diet: Diet Order             Diet regular Room service appropriate? Yes; Fluid consistency: Thin  Diet effective now                     Antimicrobial agents: Anti-infectives (From admission, onward)  Start     Dose/Rate Route Frequency Ordered Stop   09/08/24 1215  cefTRIAXone  (ROCEPHIN ) 1 g in sodium chloride  0.9 % 100 mL IVPB        1 g 200 mL/hr over 30 Minutes Intravenous Every 24 hours 09/08/24 1126 09/10/24 1318   09/07/24 1000  azithromycin  (ZITHROMAX ) tablet 250 mg        250 mg Oral Daily 09/06/24 1524     09/05/24 0515  azithromycin  (ZITHROMAX ) 250 mg in dextrose  5 % 125 mL IVPB  Status:  Discontinued        250 mg 127.5 mL/hr over 60 Minutes Intravenous Every 24  hours 09/05/24 0504 09/06/24 1524   09/05/24 0130  cefTRIAXone  (ROCEPHIN ) 1 g in sodium chloride  0.9 % 100 mL IVPB        1 g 200 mL/hr over 30 Minutes Intravenous  Once 09/05/24 0126 09/05/24 0234        MEDICATIONS: Scheduled Meds:  arformoterol   15 mcg Nebulization BID   azithromycin   250 mg Oral Daily   budesonide  (PULMICORT ) nebulizer solution  0.5 mg Nebulization BID   carvedilol   3.125 mg Oral BID   enoxaparin  (LOVENOX ) injection  40 mg Subcutaneous Q24H   feeding supplement  237 mL Oral BID BM   midodrine   10 mg Oral TID WC   pantoprazole   40 mg Oral Daily   predniSONE   40 mg Oral Q breakfast   revefenacin   175 mcg Nebulization Daily   simvastatin   10 mg Oral QHS   tamsulosin   0.4 mg Oral Daily   Continuous Infusions:   PRN Meds:.acetaminophen , artificial tears, hydrALAZINE , ipratropium-albuterol , melatonin, polyethylene glycol, prochlorperazine , simethicone    I have personally reviewed following labs and imaging studies  LABORATORY DATA: CBC: Recent Labs  Lab 09/06/24 0427 09/07/24 0250 09/08/24 0326 09/09/24 0214 09/10/24 0325  WBC 3.7* 5.4 7.0 5.6 2.8*  HGB 10.1* 9.5* 9.3* 9.8* 8.6*  HCT 32.9* 31.5* 30.5* 33.5* 27.8*  MCV 93.2 94.0 95.3 97.4 92.7  PLT 146* 141* 144* 156 147*    Basic Metabolic Panel: Recent Labs  Lab 09/06/24 0427 09/07/24 0250 09/08/24 0326 09/09/24 0214 09/10/24 0325 09/11/24 0439  NA 137 135 137 137 137 137  K 4.6 4.7 3.9 4.6 4.2 4.1  CL 85* 87* 83* 90* 91* 93*  CO2 41* 42* >45* 43* 35* 37*  GLUCOSE 109* 192* 109* 158* 114* 79  BUN 15 23 24* 24* 28* 30*  CREATININE 0.69 0.75 0.71 0.87 0.83 0.69  CALCIUM 9.4 9.2 9.1 9.2 9.5 9.5  MG 1.9 1.9  --   --   --   --   PHOS 3.6 3.4  --   --   --   --     GFR: Estimated Creatinine Clearance: 67.2 mL/min (by C-G formula based on SCr of 0.69 mg/dL).  Liver Function Tests: Recent Labs  Lab 09/05/24 0147 09/06/24 0427  AST 25  --   ALT 19  --   ALKPHOS 62  --   BILITOT  0.2  --   PROT 6.6  --   ALBUMIN  3.7 3.4*   No results for input(s): LIPASE, AMYLASE in the last 168 hours. No results for input(s): AMMONIA in the last 168 hours.  Coagulation Profile: No results for input(s): INR, PROTIME in the last 168 hours.  Cardiac Enzymes: No results for input(s): CKTOTAL, CKMB, CKMBINDEX, TROPONINI in the last 168 hours.  BNP (last 3 results) No results for input(s): PROBNP in the last 8760  hours.  Lipid Profile: No results for input(s): CHOL, HDL, LDLCALC, TRIG, CHOLHDL, LDLDIRECT in the last 72 hours.  Thyroid Function Tests: No results for input(s): TSH, T4TOTAL, FREET4, T3FREE, THYROIDAB in the last 72 hours.  Anemia Panel: No results for input(s): VITAMINB12, FOLATE, FERRITIN, TIBC, IRON, RETICCTPCT in the last 72 hours.  Urine analysis:    Component Value Date/Time   COLORURINE YELLOW 09/04/2024 2145   APPEARANCEUR HAZY (A) 09/04/2024 2145   LABSPEC 1.014 09/04/2024 2145   PHURINE 7.0 09/04/2024 2145   GLUCOSEU NEGATIVE 09/04/2024 2145   HGBUR NEGATIVE 09/04/2024 2145   BILIRUBINUR NEGATIVE 09/04/2024 2145   BILIRUBINUR small (A) 09/30/2021 1510   KETONESUR NEGATIVE 09/04/2024 2145   PROTEINUR 30 (A) 09/04/2024 2145   UROBILINOGEN 1.0 09/30/2021 1510   NITRITE NEGATIVE 09/04/2024 2145   LEUKOCYTESUR MODERATE (A) 09/04/2024 2145    Sepsis Labs: Lactic Acid, Venous    Component Value Date/Time   LATICACIDVEN 0.7 08/20/2021 0413    MICROBIOLOGY: Recent Results (from the past 240 hours)  Urine Culture     Status: Abnormal   Collection Time: 09/04/24  9:45 PM   Specimen: Urine, Clean Catch  Result Value Ref Range Status   Specimen Description URINE, CLEAN CATCH  Final   Special Requests   Final    NONE Performed at [facility], [address], [place], [place] [address]    Culture >=100,000 COLONIES/mL ESCHERICHIA COLI (A)  Final   Report Status 09/07/2024 FINAL  Final    Organism ID, Bacteria ESCHERICHIA COLI (A)  Final      Susceptibility   Escherichia coli - MIC*    AMPICILLIN >=32 RESISTANT Resistant     CEFAZOLIN (URINE) Value in next row Sensitive      4 SENSITIVEThis is a modified FDA-approved test that has been validated and its performance characteristics determined by the reporting laboratory.  This laboratory is certified under the Clinical Laboratory Improvement Amendments CLIA as qualified to perform high complexity clinical laboratory testing.    CEFEPIME Value in next row Sensitive      4 SENSITIVEThis is a modified FDA-approved test that has been validated and its performance characteristics determined by the reporting laboratory.  This laboratory is certified under the Clinical Laboratory Improvement Amendments CLIA as qualified to perform high complexity clinical laboratory testing.    ERTAPENEM Value in next row Sensitive      4 SENSITIVEThis is a modified FDA-approved test that has been validated and its performance characteristics determined by the reporting laboratory.  This laboratory is certified under the Clinical Laboratory Improvement Amendments CLIA as qualified to perform high complexity clinical laboratory testing.    CEFTRIAXONE  Value in next row Sensitive      4 SENSITIVEThis is a modified FDA-approved test that has been validated and its performance characteristics determined by the reporting laboratory.  This laboratory is certified under the Clinical Laboratory Improvement Amendments CLIA as qualified to perform high complexity clinical laboratory testing.    CIPROFLOXACIN Value in next row Sensitive      4 SENSITIVEThis is a modified FDA-approved test that has been validated and its performance characteristics determined by the reporting laboratory.  This laboratory is certified under the Clinical Laboratory Improvement Amendments CLIA as qualified to perform high complexity clinical laboratory testing.    GENTAMICIN Value in next  row Sensitive      4 SENSITIVEThis is a modified FDA-approved test that has been validated and its performance characteristics determined by the reporting laboratory.  This laboratory is certified under the Clinical Laboratory Improvement Amendments CLIA as qualified to perform high complexity clinical laboratory testing.    NITROFURANTOIN Value in next row Sensitive      4 SENSITIVEThis is a modified FDA-approved test that has been validated and its performance characteristics determined by the reporting laboratory.  This laboratory is certified under the Clinical Laboratory Improvement Amendments CLIA as qualified to perform high complexity clinical laboratory testing.    TRIMETH /SULFA  Value in next row Sensitive      4 SENSITIVEThis is a modified FDA-approved test that has been validated and its performance characteristics determined by the reporting laboratory.  This laboratory is certified under the Clinical Laboratory Improvement Amendments CLIA as qualified to perform high complexity clinical laboratory testing.    AMPICILLIN/SULBACTAM Value in next row Intermediate      4 SENSITIVEThis is a modified FDA-approved test that has been validated and its performance characteristics determined by the reporting laboratory.  This laboratory is certified under the Clinical Laboratory Improvement Amendments CLIA as qualified to perform high complexity clinical laboratory testing.    PIP/TAZO Value in next row Sensitive      <=4 SENSITIVEThis is a modified FDA-approved test that has been validated and its performance characteristics determined by the reporting laboratory.  This laboratory is certified under the Clinical Laboratory Improvement Amendments CLIA as qualified to perform high complexity clinical laboratory testing.    MEROPENEM Value in next row Sensitive      <=4 SENSITIVEThis is a modified FDA-approved test that has been validated and its performance characteristics determined by the reporting  laboratory.  This laboratory is certified under the Clinical Laboratory Improvement Amendments CLIA as qualified to perform high complexity clinical laboratory testing.    * >=100,000 COLONIES/mL ESCHERICHIA COLI    RADIOLOGY STUDIES/RESULTS: VAS US  LOWER EXTREMITY VENOUS (DVT) Result Date: 09/10/2024  Lower Venous DVT Study Patient Name:  JERMAYNE Shouse  Date of Exam:   09/10/2024 Medical Rec #: [id number]     Accession #:    [id number] Date of Birth: 10/03/1951    Patient Gender: M Patient Age:   55 years Exam Location:  [facility] Procedure:      VAS US  LOWER EXTREMITY VENOUS (DVT) Referring Phys: DONALDA APPLEBAUM --------------------------------------------------------------------------------  Indications: Swelling, Edema, and SOB.  Risk Factors: Lung cancer. Comparison Study: Previous study on 9.6.2022. Performing Technologist: Edilia Elden Appl  Examination Guidelines: A complete evaluation includes B-mode imaging, spectral Doppler, color Doppler, and power Doppler as needed of all accessible portions of each vessel. Bilateral testing is considered an integral part of a complete examination. Limited examinations for reoccurring indications may be performed as noted. The reflux portion of the exam is performed with the patient in reverse Trendelenburg.  +---------+---------------+---------+-----------+----------+--------------+ RIGHT    CompressibilityPhasicitySpontaneityPropertiesThrombus Aging +---------+---------------+---------+-----------+----------+--------------+ CFV      Full           Yes      Yes                                 +---------+---------------+---------+-----------+----------+--------------+ SFJ      Full           Yes      Yes                                 +---------+---------------+---------+-----------+----------+--------------+ FV Prox  Full                                                         +---------+---------------+---------+-----------+----------+--------------+  FV Mid   Full                                                        +---------+---------------+---------+-----------+----------+--------------+ FV DistalFull                                                        +---------+---------------+---------+-----------+----------+--------------+ PFV      Full                                                        +---------+---------------+---------+-----------+----------+--------------+ POP      Full           Yes      Yes                                 +---------+---------------+---------+-----------+----------+--------------+ PTV      Full                                                        +---------+---------------+---------+-----------+----------+--------------+ PERO     Full                                                        +---------+---------------+---------+-----------+----------+--------------+   +---------+---------------+---------+-----------+----------+--------------+ LEFT     CompressibilityPhasicitySpontaneityPropertiesThrombus Aging +---------+---------------+---------+-----------+----------+--------------+ CFV      Full           Yes      Yes                                 +---------+---------------+---------+-----------+----------+--------------+ SFJ      Full           Yes      Yes                                 +---------+---------------+---------+-----------+----------+--------------+ FV Prox  Full                                                        +---------+---------------+---------+-----------+----------+--------------+ FV Mid   Full                                                        +---------+---------------+---------+-----------+----------+--------------+  FV DistalFull                                                         +---------+---------------+---------+-----------+----------+--------------+ PFV      Full                                                        +---------+---------------+---------+-----------+----------+--------------+ POP      Full           Yes      Yes                                 +---------+---------------+---------+-----------+----------+--------------+ PTV      Full                                                        +---------+---------------+---------+-----------+----------+--------------+ PERO     Full                                                        +---------+---------------+---------+-----------+----------+--------------+     Summary: BILATERAL: - No evidence of deep vein thrombosis seen in the lower extremities, bilaterally. -No evidence of popliteal cyst, bilaterally.   *See table(s) above for measurements and observations. Electronically signed by Debby Robertson on 09/10/2024 at 7:40:39 PM.    Final      LOS: 6 days   Donalda Applebaum, MD  [facility]    To contact the attending provider between 7A-7P or the covering provider during after hours 7P-7A, please log into the web site [email or web address] and access using universal [place] password for that web site. If you do not have the password, please call the hospital operator.  09/11/2024, 12:05 PM

## 2024-09-11 NOTE — Plan of Care (Signed)
   Problem: Education: Goal: Knowledge of General Education information will improve Description Including pain rating scale, medication(s)/side effects and non-pharmacologic comfort measures Outcome: Progressing

## 2024-09-11 NOTE — TOC Progression Note (Signed)
 Transition of Care [facility]) - Progression Note    Patient Details  Name: [name] [name] MRN: [id number] Date of Birth: 10-12-1951  [facility]) CM/SW Contact  Roxie KANDICE Stain, RN Phone Number: 09/11/2024, 1:21 PM  Clinical Narrative:    Left VM with [facility] respiratory department for replacement bipap mask. Left this RNCM number and Patient's daughter, Madelin, number.  Expected Discharge Plan: Home w Home Health Services Barriers to Discharge: Continued Medical Work up               Expected Discharge Plan and Services   Discharge Planning Services: CM Consult Post Acute Care Choice: Home Health Living arrangements for the past 2 months: Single Family Home                 DME Arranged: N/A         HH Arranged: PT, OT HH Agency: [facility] Date [facility] Agency Contacted: 09/10/24 Time HH Agency Contacted: 1127 Representative spoke with at [facility] Agency: Darleene   Social Drivers of Health (SDOH) Interventions SDOH Screenings   Food Insecurity: Food Insecurity Present (09/05/2024)  Housing: High Risk (09/05/2024)  Transportation Needs: No Transportation Needs (09/05/2024)  Utilities: Not At Risk (09/05/2024)  Depression (PHQ2-9): Low Risk  (08/22/2022)  Social Connections: Moderately Isolated (09/05/2024)  Tobacco Use: Medium Risk (05/27/2024)    Readmission Risk Interventions     No data to display

## 2024-09-11 NOTE — TOC Progression Note (Signed)
 Transition of Care [facility]) - Progression Note    Patient Details  Name: [name] [name] MRN: [id number] Date of Birth: 07-24-1951  [facility] Care [facility]) CM/SW Contact  Roxie KANDICE Stain, RN Phone Number: 09/11/2024, 4:05 PM  Clinical Narrative:    Beatris to Tammy, daughter, regarding a new bipap mask from [place]. This RNCM spoke to [name] who states he will mail the new mask to the patient's address.    Expected Discharge Plan: Home w Home Health Services Barriers to Discharge: Continued Medical Work up               Expected Discharge Plan and Services   Discharge Planning Services: CM Consult Post Acute Care Choice: Home Health Living arrangements for the past 2 months: Single Family Home                 DME Arranged: N/A         HH Arranged: PT, OT HH Agency: [facility] Date [facility] Agency Contacted: 09/10/24 Time HH Agency Contacted: 1127 Representative spoke with at [facility] Agency: Darleene   Social Drivers of Health (SDOH) Interventions SDOH Screenings   Food Insecurity: Food Insecurity Present (09/05/2024)  Housing: High Risk (09/05/2024)  Transportation Needs: No Transportation Needs (09/05/2024)  Utilities: Not At Risk (09/05/2024)  Depression (PHQ2-9): Low Risk  (08/22/2022)  Social Connections: Moderately Isolated (09/05/2024)  Tobacco Use: Medium Risk (05/27/2024)    Readmission Risk Interventions     No data to display

## 2024-09-11 NOTE — Plan of Care (Signed)
   Problem: Education: Goal: Knowledge of General Education information will improve Description: Including pain rating scale, medication(s)/side effects and non-pharmacologic comfort measures Outcome: Progressing   Problem: Nutrition: Goal: Adequate nutrition will be maintained Outcome: Progressing

## 2024-09-12 ENCOUNTER — Telehealth: Payer: Self-pay

## 2024-09-12 ENCOUNTER — Other Ambulatory Visit (HOSPITAL_COMMUNITY): Payer: Self-pay

## 2024-09-12 DIAGNOSIS — N400 Enlarged prostate without lower urinary tract symptoms: Secondary | ICD-10-CM | POA: Diagnosis not present

## 2024-09-12 DIAGNOSIS — N390 Urinary tract infection, site not specified: Secondary | ICD-10-CM | POA: Diagnosis not present

## 2024-09-12 DIAGNOSIS — J441 Chronic obstructive pulmonary disease with (acute) exacerbation: Secondary | ICD-10-CM | POA: Diagnosis not present

## 2024-09-12 DIAGNOSIS — I10 Essential (primary) hypertension: Secondary | ICD-10-CM | POA: Diagnosis not present

## 2024-09-12 LAB — CREATININE, SERUM
Creatinine, Ser: 0.7 mg/dL (ref 0.61–1.24)
GFR, Estimated: >60 mL/min (ref >60)

## 2024-09-12 MED ORDER — TAMSULOSIN HCL 0.4 MG PO CAPS
0.4000 mg | ORAL_CAPSULE | Freq: Every day | ORAL | 2 refills | Status: DC
  Filled 2024-09-12: qty 30, 30d supply, fill #0

## 2024-09-12 MED ORDER — PREDNISONE 5 MG PO TABS
30.0000 mg | ORAL_TABLET | Freq: Every day | ORAL | Status: DC
  Administered 2024-09-13: 30 mg via ORAL
  Filled 2024-09-12: qty 2

## 2024-09-12 MED ORDER — PREDNISONE 10 MG PO TABS
ORAL_TABLET | ORAL | 0 refills | Status: DC
  Filled 2024-09-12: qty 6, 3d supply, fill #0

## 2024-09-12 NOTE — TOC Transition Note (Addendum)
 Transition of Care [facility]) - Discharge Note   Patient Details  Name: Edwar Coe MRN: [id number] Date of Birth: 09-23-1951  Transition of Care [facility]) CM/SW Contact:  Corean JAYSON Canary, RN Phone Number: 09/12/2024, 8:46 AM   Clinical Narrative:     Patient to transition home today with home health, set up with [facility], no further needs identified Informed Darleene from [facility] of discharge   Final next level of care: Home w Home Health Services Barriers to Discharge: No Barriers Identified   Patient Goals and CMS Choice Patient states their goals for this hospitalization and ongoing recovery are:: to go home CMS Medicare.gov Compare Post Acute Care list provided to:: Patient Choice offered to / list presented to : Patient      Discharge Placement                       Discharge Plan and Services Additional resources added to the After Visit Summary for     Discharge Planning Services: CM Consult Post Acute Care Choice: Home Health          DME Arranged: N/A         HH Arranged: PT, OT HH Agency: [facility] Date [facility] Agency Contacted: 09/10/24 Time HH Agency Contacted: 1127 Representative spoke with at [facility] Agency: Darleene  Social Drivers of Health (SDOH) Interventions SDOH Screenings   Food Insecurity: Food Insecurity Present (09/05/2024)  Housing: High Risk (09/05/2024)  Transportation Needs: No Transportation Needs (09/05/2024)  Utilities: Not At Risk (09/05/2024)  Depression (PHQ2-9): Low Risk  (08/22/2022)  Social Connections: Moderately Isolated (09/05/2024)  Tobacco Use: Medium Risk (05/27/2024)     Readmission Risk Interventions     No data to display

## 2024-09-12 NOTE — Plan of Care (Signed)
  Problem: Education: Goal: Knowledge of General Education information will improve Description: Including pain rating scale, medication(s)/side effects and non-pharmacologic comfort measures Outcome: Adequate for Discharge   Problem: Health Behavior/Discharge Planning: Goal: Ability to manage health-related needs will improve Outcome: Adequate for Discharge   Problem: Clinical Measurements: Goal: Diagnostic test results will improve Outcome: Adequate for Discharge Goal: Respiratory complications will improve Outcome: Adequate for Discharge   Problem: Activity: Goal: Risk for activity intolerance will decrease Outcome: Adequate for Discharge   Problem: Nutrition: Goal: Adequate nutrition will be maintained Outcome: Adequate for Discharge

## 2024-09-12 NOTE — Discharge Summary (Addendum)
 PATIENT DETAILS Name: [name] [name] Age: 73 y.o. Sex: male Date of Birth: 1951-01-27 MRN: [id number]. Admitting Physician: Rollene GORMAN Hurst, MD ERE:[facility]  Admit Date: 09/04/2024 Discharge date: 09/13/2024  Recommendations for Outpatient Follow-up:  Follow up with PCP in 1-2 weeks Please obtain CMP/CBC in one week  Admitted From:  Home  Disposition: Home health   Discharge Condition: good  CODE STATUS:   Code Status: Limited: Do not attempt resuscitation (DNR) -DNR-LIMITED -Do Not Intubate/DNI    Diet recommendation:  Diet Order             Diet - low sodium heart healthy           Diet regular Room service appropriate? Yes; Fluid consistency: Thin  Diet effective now                    Brief Summary: Patient is a 73 y.o.  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   Significant events: 9/19>> admit to [facility] 9/23>> somnolence with worsening hypercarbia-BiPAP.   Significant studies: 9/19>> CT angio chest: No PE 9/19>> CT abdomen/pelvis: No acute findings. 9/24>> B/L LE Doppler: No DVT   Significant microbiology data: 9/18>> urine culture: E. coli   Procedures: None   Consults: None  Brief Hospital Course: Acute on chronic hypercarbic respiratory failure Secondary COPD exacerbation/noncompliance to BiPAP Much better after treatment of underlying etiology and compliance with BiPAP Mandatory BiPAP at night/when sleeping Back on usual 2-3 L of oxygen during the daytime.   COPD exacerbation Chronic hypoxic respiratory failure on home O2 3 L. Significant better-back to baseline-requesting discharge With prednisone  taper Continue bronchodilators.  HFpEF Appears euvolemic As needed diuretics.   Minimally elevated D-dimer CTA chest negative for PE/Dopplers  negative for DVT. Supportive care.   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 coli UTI Completed a course of antibiotics.   Underweight: Estimated body mass index is 18.02 kg/m as calculated from the following:   Height as of this encounter: 5' 9 (1.753 m).   Weight as of this encounter: 55.3 kg.   Discharge Diagnoses:  Principal Problem:   COPD exacerbation (HCC) Active Problems:   COPD (chronic obstructive pulmonary disease) (HCC)   Discharge Instructions:  Activity:  As tolerated with Full fall precautions use walker/cane & assistance as needed Discharge Instructions     Call MD for:  difficulty breathing, headache or visual disturbances   Complete by: As directed    Diet - low sodium heart healthy   Complete by: As directed    Discharge instructions   Complete by: As directed    Follow with Primary MD  [facility] in 1-2 weeks  Please get a complete blood count and chemistry panel checked by your Primary MD at your next visit, and again as instructed by your Primary MD.  Get Medicines reviewed and adjusted: Please take all your medications with you for your next visit with your Primary MD  Laboratory/radiological data: Please request your Primary MD to go over all hospital tests and procedure/radiological results at the follow up, please  ask your Primary MD to get all Hospital records sent to his/her office.  In some cases, they will be blood work, cultures and biopsy results pending at the time of your discharge. Please request that your primary care M.D. follows up on these results.  Also Note the following: If you experience worsening of your admission symptoms, develop shortness of  breath, life threatening emergency, suicidal or homicidal thoughts you must seek medical attention immediately by calling 911 or calling your MD immediately  if symptoms less severe.  You must read complete instructions/literature along with all the possible adverse reactions/side effects for all the Medicines you take and that have been prescribed to you. Take any new Medicines after you have completely understood and accpet all the possible adverse reactions/side effects.   Do not drive when taking Pain medications or sleeping medications (Benzodaizepines)  Do not take more than prescribed Pain, Sleep and Anxiety Medications. It is not advisable to combine anxiety,sleep and pain medications without talking with your primary care practitioner  Special Instructions: If you have smoked or chewed Tobacco  in the last 2 yrs please stop smoking, stop any regular Alcohol   and or any Recreational drug use.  Wear Seat belts while driving.  Please note: You were cared for by a hospitalist during your hospital stay. Once you are discharged, your primary care physician will handle any further medical issues. Please note that NO REFILLS for any discharge medications will be authorized once you are discharged, as it is imperative that you return to your primary care physician (or establish a relationship with a primary care physician if you do not have one) for your post hospital discharge needs so that they can reassess your need for medications and monitor your lab values.   Increase activity slowly   Complete by: As directed       Allergies as of 09/13/2024       Reactions   Ms Contin  [morphine ] Other (See Comments)   Very sensitive d/t resp changes that cause hypercarbia   Zestoretic [lisinopril -hydrochlorothiazide] Other (See Comments)   Increased urinary frequency  Urinary retention that resolved after resuming lisinopril  by itself        Medication List     STOP taking these medications     lisinopril  10 MG tablet Commonly known as: ZESTRIL        TAKE these medications    acetaminophen  500 MG tablet Commonly known as: TYLENOL  Take 500 mg by mouth 2 (two) times daily as needed for headache or fever (pain).   albuterol  108 (90 Base) MCG/ACT inhaler Commonly known as: VENTOLIN  HFA Inhale 1-2 puffs into the lungs every 6 (six) hours as needed.   ARFORMOTEROL  TARTRATE IN Take 7.5 mcg by nebulization 2 (two) times daily. Arformoterol  7.50mcg/2mL   budesonide  0.5 MG/2ML nebulizer solution Commonly known as: PULMICORT  Inhale 0.5 mg into the lungs 2 (two) times daily.   carboxymethylcellulose 1 % ophthalmic solution Place 1 drop into both eyes 2 (two) times daily as needed (dry eyes). For dry eyes   carvedilol  3.125 MG tablet Commonly known as: COREG  Take 1 tablet (3.125 mg total) by mouth 2 (two) times daily.   cholecalciferol  25 MCG (1000 UNIT) tablet Commonly known as: VITAMIN D3 Take 1,000 Units by mouth daily.   feeding supplement Liqd Take 237 mLs by mouth 3 (three) times daily between meals.   furosemide  40 MG tablet Commonly known as: LASIX  Take 40 mg by mouth daily as needed for fluid or edema.  lisinopril -hydrochlorothiazide 20-12.5 MG tablet Commonly known as: ZESTORETIC Take 1 tablet by mouth daily.   multivitamin capsule Take 1 capsule by mouth daily.   omeprazole  20 MG capsule Commonly known as: PRILOSEC Take 1 capsule (20 mg total) by mouth 2 (two) times daily before a meal.   predniSONE  10 MG tablet Commonly known as: DELTASONE  Take 30 mg daily for 1 day, then 20 mg daily for 1 day, then 10 mg daily for 1 day, then stop Start taking on: September 13, 2024   revefenacin  175 MCG/3ML nebulizer solution Commonly known as: YUPELRI  Take 175 mcg by nebulization daily.   senna 8.6 MG Tabs tablet Commonly known as: SENOKOT Take 8.6 mg by mouth daily as needed (constipation).   SIMETHICONE  PO Take 1 tablet by mouth daily as needed  (bloating, flatulence).   simvastatin  20 MG tablet Commonly known as: ZOCOR  Take 10 mg by mouth at bedtime.   tamsulosin  0.4 MG Caps capsule Commonly known as: FLOMAX  Take 1 capsule (0.4 mg total) by mouth daily.        Follow-up Information     [facility] Follow up.   Specialty: Home Health Services Why: For home PT and OT they will call you in 1-2 days to set up a time to come out to your house. Contact information: [address] [place] [address] [phone]         [facility], [facility]. Schedule an appointment as soon as possible for a visit in 1 week(s).   Specialty: General Practice Contact information: [address] [place] [address] [phone]         Pulmonary Follow up on 09/17/2024.   Why: Has a virtual appointment  on 10/1               Allergies  Allergen Reactions   Ms Contin  [Morphine ] Other (See Comments)    Very sensitive d/t resp changes that cause hypercarbia   Zestoretic [Lisinopril -Hydrochlorothiazide] Other (See Comments)    Increased urinary frequency  Urinary retention that resolved after resuming lisinopril  by itself     Other Procedures/Studies: VAS US  LOWER EXTREMITY VENOUS (DVT) Result Date: 09/10/2024  Lower Venous DVT Study Patient Name:  KIPTYN Heinkel  Date of Exam:   09/10/2024 Medical Rec #: [id number]     Accession #:    [id number] Date of Birth: 06-16-1951    Patient Gender: M Patient Age:   18 years Exam Location:  [facility] Procedure:      VAS US  LOWER EXTREMITY VENOUS (DVT) Referring Phys: DONALDA APPLEBAUM --------------------------------------------------------------------------------  Indications: Swelling, Edema, and SOB.  Risk Factors: Lung cancer. Comparison Study: Previous study on 9.6.2022. Performing Technologist: Edilia Elden Appl  Examination Guidelines: A complete evaluation includes B-mode imaging, spectral Doppler, color Doppler, and power Doppler as needed of all  accessible portions of each vessel. Bilateral testing is considered an integral part of a complete examination. Limited examinations for reoccurring indications may be performed as noted. The reflux portion of the exam is performed with the patient in reverse Trendelenburg.  +---------+---------------+---------+-----------+----------+--------------+ RIGHT    CompressibilityPhasicitySpontaneityPropertiesThrombus Aging +---------+---------------+---------+-----------+----------+--------------+ CFV      Full           Yes      Yes                                 +---------+---------------+---------+-----------+----------+--------------+ SFJ      Full  Yes      Yes                                 +---------+---------------+---------+-----------+----------+--------------+ FV Prox  Full                                                        +---------+---------------+---------+-----------+----------+--------------+ FV Mid   Full                                                        +---------+---------------+---------+-----------+----------+--------------+ FV DistalFull                                                        +---------+---------------+---------+-----------+----------+--------------+ PFV      Full                                                        +---------+---------------+---------+-----------+----------+--------------+ POP      Full           Yes      Yes                                 +---------+---------------+---------+-----------+----------+--------------+ PTV      Full                                                        +---------+---------------+---------+-----------+----------+--------------+ PERO     Full                                                        +---------+---------------+---------+-----------+----------+--------------+   +---------+---------------+---------+-----------+----------+--------------+  LEFT     CompressibilityPhasicitySpontaneityPropertiesThrombus Aging +---------+---------------+---------+-----------+----------+--------------+ CFV      Full           Yes      Yes                                 +---------+---------------+---------+-----------+----------+--------------+ SFJ      Full           Yes      Yes                                 +---------+---------------+---------+-----------+----------+--------------+ FV Prox  Full                                                        +---------+---------------+---------+-----------+----------+--------------+  FV Mid   Full                                                        +---------+---------------+---------+-----------+----------+--------------+ FV DistalFull                                                        +---------+---------------+---------+-----------+----------+--------------+ PFV      Full                                                        +---------+---------------+---------+-----------+----------+--------------+ POP      Full           Yes      Yes                                 +---------+---------------+---------+-----------+----------+--------------+ PTV      Full                                                        +---------+---------------+---------+-----------+----------+--------------+ PERO     Full                                                        +---------+---------------+---------+-----------+----------+--------------+     Summary: BILATERAL: - No evidence of deep vein thrombosis seen in the lower extremities, bilaterally. -No evidence of popliteal cyst, bilaterally.   *See table(s) above for measurements and observations. Electronically signed by Debby Robertson on 09/10/2024 at 7:40:39 PM.    Final    CT Angio Chest PE W/Cm &/Or Wo Cm Result Date: 09/05/2024 CLINICAL DATA:  Chest and abdominal pain for 2 weeks, initial encounter EXAM: CT  ANGIOGRAPHY CHEST CT ABDOMEN AND PELVIS WITH CONTRAST TECHNIQUE: Multidetector CT imaging of the chest was performed using the standard protocol during bolus administration of intravenous contrast. Multiplanar CT image reconstructions and MIPs were obtained to evaluate the vascular anatomy. Multidetector CT imaging of the abdomen and pelvis was performed using the standard protocol during bolus administration of intravenous contrast. RADIATION DOSE REDUCTION: This exam was performed according to the departmental dose-optimization program which includes automated exposure control, adjustment of the mA and/or kV according to patient size and/or use of iterative reconstruction technique. CONTRAST:  75mL OMNIPAQUE  IOHEXOL  350 MG/ML SOLN COMPARISON:  Chest x-ray from the previous day. FINDINGS: CTA CHEST FINDINGS Cardiovascular: Atherosclerotic calcifications of the thoracic aorta are noted. No aneurysmal dilatation or dissection is noted. Pulmonary artery shows a normal branching pattern. No intraluminal filling defect to suggest pulmonary embolism is seen. The left pulmonary artery is truncated consistent with prior surgery of the  left. Mediastinum/Nodes: Mediastinum is somewhat shifted to the right consistent with the mass effect from changes in the left hemithorax. No hilar or mediastinal adenopathy is noted. The esophagus as visualized is within normal limits. Lungs/Pleura: Emphysematous changes are noted throughout the right lung. Minimal effusion is seen. No focal infiltrate or sizable parenchymal nodule is noted. Postsurgical changes are noted in the left hemithorax with a rim calcified fluid collection identified stable in appearance from multiple previous exams. Musculoskeletal: No chest wall abnormality. No acute or significant osseous findings. Review of the MIP images confirms the above findings. CT ABDOMEN and PELVIS FINDINGS Hepatobiliary: Gallbladder is decompressed. Scattered cysts are noted within the  liver. Pancreas: Unremarkable. No pancreatic ductal dilatation or surrounding inflammatory changes. Spleen: Normal in size without focal abnormality. Adrenals/Urinary Tract: Adrenal glands are within normal limits. Kidneys demonstrate a normal enhancement pattern bilaterally. No renal calculi or obstructive changes are noted. The bladder is partially distended. Stomach/Bowel: No obstructive or inflammatory changes of the colon are noted. The appendix is not well visualized. No inflammatory changes to suggest appendicitis are noted. The small bowel and stomach are within normal limits. Vascular/Lymphatic: Aortic atherosclerosis. No enlarged abdominal or pelvic lymph nodes. Reproductive: Prostate is enlarged indenting upon the inferior aspect of the bladder. Other: Mild free fluid is noted within the pelvis. Musculoskeletal: No acute or significant osseous findings. Review of the MIP images confirms the above findings. IMPRESSION: CTA of the chest: No evidence of pulmonary emboli. Atherosclerotic calcifications and emphysematous change. CT abdomen and pelvis: Chronic changes without acute abnormality. Electronically Signed   By: Oneil Devonshire M.D.   On: 09/05/2024 03:25   CT ABDOMEN PELVIS W CONTRAST Result Date: 09/05/2024 CLINICAL DATA:  Chest and abdominal pain for 2 weeks, initial encounter EXAM: CT ANGIOGRAPHY CHEST CT ABDOMEN AND PELVIS WITH CONTRAST TECHNIQUE: Multidetector CT imaging of the chest was performed using the standard protocol during bolus administration of intravenous contrast. Multiplanar CT image reconstructions and MIPs were obtained to evaluate the vascular anatomy. Multidetector CT imaging of the abdomen and pelvis was performed using the standard protocol during bolus administration of intravenous contrast. RADIATION DOSE REDUCTION: This exam was performed according to the departmental dose-optimization program which includes automated exposure control, adjustment of the mA and/or kV  according to patient size and/or use of iterative reconstruction technique. CONTRAST:  75mL OMNIPAQUE  IOHEXOL  350 MG/ML SOLN COMPARISON:  Chest x-ray from the previous day. FINDINGS: CTA CHEST FINDINGS Cardiovascular: Atherosclerotic calcifications of the thoracic aorta are noted. No aneurysmal dilatation or dissection is noted. Pulmonary artery shows a normal branching pattern. No intraluminal filling defect to suggest pulmonary embolism is seen. The left pulmonary artery is truncated consistent with prior surgery of the left. Mediastinum/Nodes: Mediastinum is somewhat shifted to the right consistent with the mass effect from changes in the left hemithorax. No hilar or mediastinal adenopathy is noted. The esophagus as visualized is within normal limits. Lungs/Pleura: Emphysematous changes are noted throughout the right lung. Minimal effusion is seen. No focal infiltrate or sizable parenchymal nodule is noted. Postsurgical changes are noted in the left hemithorax with a rim calcified fluid collection identified stable in appearance from multiple previous exams. Musculoskeletal: No chest wall abnormality. No acute or significant osseous findings. Review of the MIP images confirms the above findings. CT ABDOMEN and PELVIS FINDINGS Hepatobiliary: Gallbladder is decompressed. Scattered cysts are noted within the liver. Pancreas: Unremarkable. No pancreatic ductal dilatation or surrounding inflammatory changes. Spleen: Normal in size without focal abnormality. Adrenals/Urinary Tract: Adrenal glands  are within normal limits. Kidneys demonstrate a normal enhancement pattern bilaterally. No renal calculi or obstructive changes are noted. The bladder is partially distended. Stomach/Bowel: No obstructive or inflammatory changes of the colon are noted. The appendix is not well visualized. No inflammatory changes to suggest appendicitis are noted. The small bowel and stomach are within normal limits. Vascular/Lymphatic: Aortic  atherosclerosis. No enlarged abdominal or pelvic lymph nodes. Reproductive: Prostate is enlarged indenting upon the inferior aspect of the bladder. Other: Mild free fluid is noted within the pelvis. Musculoskeletal: No acute or significant osseous findings. Review of the MIP images confirms the above findings. IMPRESSION: CTA of the chest: No evidence of pulmonary emboli. Atherosclerotic calcifications and emphysematous change. CT abdomen and pelvis: Chronic changes without acute abnormality. Electronically Signed   By: Oneil Devonshire M.D.   On: 09/05/2024 03:25   DG Chest 2 View Result Date: 09/04/2024 CLINICAL DATA:  sob EXAM: CHEST - 2 VIEW COMPARISON:  CT chest 05/27/2024, chest x-ray 05/27/2024 FINDINGS: The heart and mediastinal contours are not well visualized due to overlying left lung disease. Associated persistent left to right midline shift. Redemonstration of complete opacification of the left hemithorax in the setting of known left pneumonectomy. Left pleural calcifications again noted. No focal consolidation. Coarsened interstitial markings with no overt pulmonary edema. Nonspecific blunting of the right costophrenic angle. No pneumothorax. No acute osseous abnormality. IMPRESSION: No acute findings within the right lung. Left pneumonectomy. Electronically Signed   By: Morgane  Naveau M.D.   On: 09/04/2024 21:15     TODAY-DAY OF DISCHARGE:  Subjective:   [name] [name] today has no headache,no chest abdominal pain,no new weakness tingling or numbness, feels much better wants to go home today.   Objective:   Blood pressure 108/68, pulse 84, temperature 97.8 F (36.6 C), temperature source Oral, resp. rate 20, height 5' 9 (1.753 m), weight 55.3 kg, SpO2 98%.  Intake/Output Summary (Last 24 hours) at 09/13/2024 0901 Last data filed at 09/12/2024 2353 Gross per 24 hour  Intake --  Output 950 ml  Net -950 ml   Filed Weights   09/04/24 2047  Weight: 55.3 kg    Exam: Awake Alert,  Oriented *3, No new F.N deficits, Normal affect [place].AT,PERRAL Supple Neck,No JVD, No cervical lymphadenopathy appriciated.  Symmetrical Chest wall movement, Good air movement bilaterally, CTAB RRR,No Gallops,Rubs or new Murmurs, No Parasternal Heave +ve B.Sounds, Abd Soft, Non tender, No organomegaly appriciated, No rebound -guarding or rigidity. No Cyanosis, Clubbing or edema, No new Rash or bruise   PERTINENT RADIOLOGIC STUDIES: No results found.    PERTINENT LAB RESULTS: CBC: No results for input(s): WBC, HGB, HCT, PLT in the last 72 hours.  CMET CMP     Component Value Date/Time   NA 137 09/11/2024 0439   NA 141 09/26/2014 0431   K 4.1 09/11/2024 0439   K 3.4 (L) 09/26/2014 0431   CL 93 (L) 09/11/2024 0439   CL 104 09/26/2014 0431   CO2 37 (H) 09/11/2024 0439   CO2 30 09/26/2014 0431   GLUCOSE 79 09/11/2024 0439   GLUCOSE 82 09/26/2014 0431   BUN 30 (H) 09/11/2024 0439   BUN 16 09/26/2014 0431   CREATININE 0.70 09/12/2024 0608   CREATININE 1.34 (H) 09/26/2014 0431   CALCIUM 9.5 09/11/2024 0439   CALCIUM 8.4 (L) 09/26/2014 0431   PROT 6.6 09/05/2024 0147   PROT 6.8 09/26/2014 0431   ALBUMIN  3.4 (L) 09/06/2024 0427   ALBUMIN  3.3 (L) 09/26/2014 0431  AST 25 09/05/2024 0147   AST 27 09/26/2014 0431   ALT 19 09/05/2024 0147   ALT 29 09/26/2014 0431   ALKPHOS 62 09/05/2024 0147   ALKPHOS 73 09/26/2014 0431   BILITOT 0.2 09/05/2024 0147   BILITOT 0.2 09/26/2014 0431   GFRNONAA >60 09/12/2024 0608   GFRNONAA 58 (L) 09/26/2014 0431   GFRNONAA >60 04/07/2013 0446    GFR Estimated Creatinine Clearance: 67.2 mL/min (by C-G formula based on SCr of 0.7 mg/dL). No results for input(s): LIPASE, AMYLASE in the last 72 hours. No results for input(s): CKTOTAL, CKMB, CKMBINDEX, TROPONINI in the last 72 hours. Invalid input(s): POCBNP No results for input(s): DDIMER in the last 72 hours. No results for input(s): HGBA1C in the last 72 hours. No  results for input(s): CHOL, HDL, LDLCALC, TRIG, CHOLHDL, LDLDIRECT in the last 72 hours. No results for input(s): TSH, T4TOTAL, T3FREE, THYROIDAB in the last 72 hours.  Invalid input(s): FREET3 No results for input(s): VITAMINB12, FOLATE, FERRITIN, TIBC, IRON, RETICCTPCT in the last 72 hours. Coags: No results for input(s): INR in the last 72 hours.  Invalid input(s): PT Microbiology: Recent Results (from the past 240 hours)  Urine Culture     Status: Abnormal   Collection Time: 09/04/24  9:45 PM   Specimen: Urine, Clean Catch  Result Value Ref Range Status   Specimen Description URINE, CLEAN CATCH  Final   Special Requests   Final    NONE Performed at [facility], [address] [place], [place] [address]    Culture >=100,000 COLONIES/mL ESCHERICHIA COLI (A)  Final   Report Status 09/07/2024 FINAL  Final   Organism ID, Bacteria ESCHERICHIA COLI (A)  Final      Susceptibility   Escherichia coli - MIC*    AMPICILLIN >=32 RESISTANT Resistant     CEFAZOLIN (URINE) Value in next row Sensitive      4 SENSITIVEThis is a modified FDA-approved test that has been validated and its performance characteristics determined by the reporting laboratory.  This laboratory is certified under the Clinical Laboratory Improvement Amendments CLIA as qualified to perform high complexity clinical laboratory testing.    CEFEPIME Value in next row Sensitive      4 SENSITIVEThis is a modified FDA-approved test that has been validated and its performance characteristics determined by the reporting laboratory.  This laboratory is certified under the Clinical Laboratory Improvement Amendments CLIA as qualified to perform high complexity clinical laboratory testing.    ERTAPENEM Value in next row Sensitive      4 SENSITIVEThis is a modified FDA-approved test that has been validated and its performance characteristics determined by the reporting laboratory.  This  laboratory is certified under the Clinical Laboratory Improvement Amendments CLIA as qualified to perform high complexity clinical laboratory testing.    CEFTRIAXONE  Value in next row Sensitive      4 SENSITIVEThis is a modified FDA-approved test that has been validated and its performance characteristics determined by the reporting laboratory.  This laboratory is certified under the Clinical Laboratory Improvement Amendments CLIA as qualified to perform high complexity clinical laboratory testing.    CIPROFLOXACIN Value in next row Sensitive      4 SENSITIVEThis is a modified FDA-approved test that has been validated and its performance characteristics determined by the reporting laboratory.  This laboratory is certified under the Clinical Laboratory Improvement Amendments CLIA as qualified to perform high complexity clinical laboratory testing.    GENTAMICIN Value in next row Sensitive  4 SENSITIVEThis is a modified FDA-approved test that has been validated and its performance characteristics determined by the reporting laboratory.  This laboratory is certified under the Clinical Laboratory Improvement Amendments CLIA as qualified to perform high complexity clinical laboratory testing.    NITROFURANTOIN Value in next row Sensitive      4 SENSITIVEThis is a modified FDA-approved test that has been validated and its performance characteristics determined by the reporting laboratory.  This laboratory is certified under the Clinical Laboratory Improvement Amendments CLIA as qualified to perform high complexity clinical laboratory testing.    TRIMETH /SULFA  Value in next row Sensitive      4 SENSITIVEThis is a modified FDA-approved test that has been validated and its performance characteristics determined by the reporting laboratory.  This laboratory is certified under the Clinical Laboratory Improvement Amendments CLIA as qualified to perform high complexity clinical laboratory testing.     AMPICILLIN/SULBACTAM Value in next row Intermediate      4 SENSITIVEThis is a modified FDA-approved test that has been validated and its performance characteristics determined by the reporting laboratory.  This laboratory is certified under the Clinical Laboratory Improvement Amendments CLIA as qualified to perform high complexity clinical laboratory testing.    PIP/TAZO Value in next row Sensitive      <=4 SENSITIVEThis is a modified FDA-approved test that has been validated and its performance characteristics determined by the reporting laboratory.  This laboratory is certified under the Clinical Laboratory Improvement Amendments CLIA as qualified to perform high complexity clinical laboratory testing.    MEROPENEM Value in next row Sensitive      <=4 SENSITIVEThis is a modified FDA-approved test that has been validated and its performance characteristics determined by the reporting laboratory.  This laboratory is certified under the Clinical Laboratory Improvement Amendments CLIA as qualified to perform high complexity clinical laboratory testing.    * >=100,000 COLONIES/mL ESCHERICHIA COLI    FURTHER DISCHARGE INSTRUCTIONS:  Get Medicines reviewed and adjusted: Please take all your medications with you for your next visit with your Primary MD  Laboratory/radiological data: Please request your Primary MD to go over all hospital tests and procedure/radiological results at the follow up, please ask your Primary MD to get all Hospital records sent to his/her office.  In some cases, they will be blood work, cultures and biopsy results pending at the time of your discharge. Please request that your primary care M.D. goes through all the records of your hospital data and follows up on these results.  Also Note the following: If you experience worsening of your admission symptoms, develop shortness of breath, life threatening emergency, suicidal or homicidal thoughts you must seek medical attention  immediately by calling 911 or calling your MD immediately  if symptoms less severe.  You must read complete instructions/literature along with all the possible adverse reactions/side effects for all the Medicines you take and that have been prescribed to you. Take any new Medicines after you have completely understood and accpet all the possible adverse reactions/side effects.   Do not drive when taking Pain medications or sleeping medications (Benzodaizepines)  Do not take more than prescribed Pain, Sleep and Anxiety Medications. It is not advisable to combine anxiety,sleep and pain medications without talking with your primary care practitioner  Special Instructions: If you have smoked or chewed Tobacco  in the last 2 yrs please stop smoking, stop any regular Alcohol   and or any Recreational drug use.  Wear Seat belts while driving.  Please note: You were  cared for by a hospitalist during your hospital stay. Once you are discharged, your primary care physician will handle any further medical issues. Please note that NO REFILLS for any discharge medications will be authorized once you are discharged, as it is imperative that you return to your primary care physician (or establish a relationship with a primary care physician if you do not have one) for your post hospital discharge needs so that they can reassess your need for medications and monitor your lab values.  Total Time spent coordinating discharge including counseling, education and face to face time equals greater than 30 minutes.  SignedBETHA Donalda Applebaum 09/13/2024 9:01 AM

## 2024-09-12 NOTE — Telephone Encounter (Signed)
 FYI- pt just wanted you to know he was in the hospital. He will connect with you on 10/1 for his virtual appt

## 2024-09-12 NOTE — Plan of Care (Signed)

## 2024-09-12 NOTE — Telephone Encounter (Signed)
 Thanks for the update

## 2024-09-12 NOTE — Telephone Encounter (Signed)
 Copied from CRM #[id number]. Topic: General - Other >> Sep 10, 2024 10:06 AM Essie A wrote: Reason for CRM: Patient is hospitalized at [facility] in [place].  He has some questions for his doctor. Please call him at [phone].  Thanks.  Routing to DWB.

## 2024-09-13 ENCOUNTER — Other Ambulatory Visit (HOSPITAL_COMMUNITY): Payer: Self-pay

## 2024-09-13 MED ORDER — PREDNISONE 10 MG PO TABS
ORAL_TABLET | ORAL | 0 refills | Status: AC
  Filled 2024-09-13: qty 6, 3d supply, fill #0

## 2024-09-13 NOTE — Progress Notes (Signed)
 Brief note Awake/alert Tolerated BiPAP/CPAP last night Chest: Clear to auscultation He did not want to leave yesterday because he wanted to make sure he is okay. Remains medically stable for discharge-he is going to call his daughter to see when she can come and pick him up. Please see discharge summary/MAR for further details

## 2024-09-13 NOTE — Plan of Care (Signed)
 Patient discharged.

## 2024-09-14 ENCOUNTER — Other Ambulatory Visit (HOSPITAL_COMMUNITY): Payer: Self-pay

## 2024-09-15 ENCOUNTER — Other Ambulatory Visit (HOSPITAL_COMMUNITY): Payer: Self-pay

## 2024-09-17 ENCOUNTER — Encounter (HOSPITAL_BASED_OUTPATIENT_CLINIC_OR_DEPARTMENT_OTHER): Admitting: Pulmonary Disease

## 2024-09-17 NOTE — Progress Notes (Signed)
 Subjective:   PATIENT ID: [name] [name] GENDER: male DOB: [birth year], MRN: [id number]   HPI  Chief Complaint  Patient presents with   Error   Reason for Visit: Follow-up  [name] [name] is a 73 year old male prior tobacco abuse (reported 10 pack years) with emphysema, mild/mod pulmonary hypertension, history of squamous cell lung cancer status postpneumonectomy in 2005 and chemotherapy, hyperlipidemia, hypertension and type 2 diabetes who presents for follow-up.  01/02/22 He reports he has been struggling to breath despite being on BiPAP. He will awaken at times from not breathing. He reports worsening nasal congestion since using oxygen and NIV. He reports headaches that began five days. Daughter presents for additional history. He seems to be gasping with speech despite compliance with oxygen. Denies coughing or wheezing. He is able to walk briskly but feels fatigued after activity.  04/10/22 Since our last visit, he is compliant with his O2. He is using 3-4L. Has not started pulmonary rehab. He has lost motivation after being denied for lung transplant. At baseline, he is walking around the house. He is compliant with his NIV. He reports difficulty tolerating the machine and will awake him at night.  07/24/22 Since our last visit he has been complaint with 3L O2. Pulmonary rehab orientation has been completed and scheduled 8/22. He reports worsening shortness of breath and sore throat that began yesterday. Sometimes this will progress but since it is early he is not sure. He is interested in [employer] device and previously qualified at last visit. Has been compliant with his NIV nightly and recent sleep study was negative for OSA, unfortunately TCO2 not available.  12/07/22 Since our last visit his daughter was diagnosed with cancer. Was unable to complete rehab due to home situation. He reports that his breathing is fair. He is planning to walk more. Scheduled with nutritionist at [facility] in  January. Compliant with trilogy, new phillips respironics trilogy device. Compliant with NIV however its uncomfortable. Uses portable oxygen. Has shortness of breath with exertion. Worsening productive cough with phlegm. No wheezing. Similar to his prior exacerbations which was improved on low-dose steroids.  04/19/23 He reports his shortness of breath is unchanged. He is compliant with Wixela and Spiriva . He is not sleeping as well and staying up late at night until early morning. Sleeping late and not eating regularly due to this. He is struggling with his Trilogy. Reports alarms due to low inspiratory pressure. Compliant with his oxygen.   01/07/24 Since our last he has exacerbation in October/November and more recently 12/10/23. Compliant with Wixela and Spiriva . However he continues to have good and bad days related to his shortness of breath. He reports less active and sleeping more. This has kept him from being less social and feeling like he is at home more. He has kept his house clean including carpet and curtains washed. Patient is also asking about risk of propane heater in home.  09/17/24 Since our last visit he was hospitalized from 09/04/24-09/13/24 for COPD exacerbation. Treated with steroids, antibiotics for CAP, oxygen and BiPAP. Also treated for E. Coli UTI. [facility] Spiriva  and Wixela to triple nebulizers in May this year but had not made the transition. Over the summer our office attempted to start Ohtuvayre  however patient missed call regarding financial hardship  **CANCELLED**  Social History: Former smoker  Past Medical History:  Diagnosis Date   Cancer (HCC)    lung   CHF (congestive heart  failure) (HCC)    Diabetes mellitus without complication (HCC)    Dyspnea    Hyperlipidemia    Hypertension     Allergies  Allergen Reactions   Ms Contin  [Morphine ] Other (See Comments)    Very sensitive d/t resp changes  that cause hypercarbia   Zestoretic [Lisinopril -Hydrochlorothiazide] Other (See Comments)    Increased urinary frequency  Urinary retention that resolved after resuming lisinopril  by itself     Outpatient Medications Prior to Visit  Medication Sig Dispense Refill   acetaminophen  (TYLENOL ) 500 MG tablet Take 500 mg by mouth 2 (two) times daily as needed for headache or fever (pain).     albuterol  (VENTOLIN  HFA) 108 (90 Base) MCG/ACT inhaler Inhale 1-2 puffs into the lungs every 6 (six) hours as needed.     ARFORMOTEROL  TARTRATE IN Take 7.5 mcg by nebulization 2 (two) times daily. Arformoterol  7.21mcg/2mL     budesonide  (PULMICORT ) 0.5 MG/2ML nebulizer solution Inhale 0.5 mg into the lungs 2 (two) times daily.     carboxymethylcellulose 1 % ophthalmic solution Place 1 drop into both eyes 2 (two) times daily as needed (dry eyes). For dry eyes     carvedilol  (COREG ) 3.125 MG tablet Take 1 tablet (3.125 mg total) by mouth 2 (two) times daily. 60 tablet 0   cholecalciferol  (VITAMIN D3) 25 MCG (1000 UNIT) tablet Take 1,000 Units by mouth daily.     feeding supplement (ENSURE PLUS HIGH PROTEIN) LIQD Take 237 mLs by mouth 3 (three) times daily between meals. 237 mL 0   furosemide  (LASIX ) 40 MG tablet Take 40 mg by mouth daily as needed for fluid or edema.     lisinopril -hydrochlorothiazide (ZESTORETIC) 20-12.5 MG tablet Take 1 tablet by mouth daily. (Patient not taking: Reported on 09/05/2024)     Multiple Vitamin (MULTIVITAMIN) capsule Take 1 capsule by mouth daily.     omeprazole  (PRILOSEC) 20 MG capsule Take 1 capsule (20 mg total) by mouth 2 (two) times daily before a meal. 69 capsule 1   revefenacin  (YUPELRI ) 175 MCG/3ML nebulizer solution Take 175 mcg by nebulization daily.     senna (SENOKOT) 8.6 MG TABS tablet Take 8.6 mg by mouth daily as needed (constipation).     SIMETHICONE  PO Take 1 tablet by mouth daily as needed (bloating, flatulence).     simvastatin  (ZOCOR ) 20 MG tablet Take 10 mg by  mouth at bedtime.     tamsulosin  (FLOMAX ) 0.4 MG CAPS capsule Take 1 capsule (0.4 mg total) by mouth daily. 30 capsule 2   No facility-administered medications prior to visit.    Review of Systems  Constitutional:  Positive for malaise/fatigue. Negative for chills, diaphoresis, fever and weight loss.  HENT:  Negative for congestion.   Respiratory:  Positive for shortness of breath. Negative for cough, hemoptysis, sputum production and wheezing.   Cardiovascular:  Negative for chest pain, palpitations and leg swelling.     Objective:   There were no vitals filed for this visit.      Physical Exam: General: Thin-appearing, no acute distress HENT: [place], AT Eyes: EOMI, no scleral icterus Respiratory: Absent left air entry. Clear to auscultation bilaterally.  No crackles, wheezing or rales Cardiovascular: RRR, -M/R/G, no JVD Extremities:-Edema,-tenderness Neuro: AAO x4, CNII-XII grossly intact Psych: Normal mood, normal affect  Data Reviewed:  Imaging: CXR 08/26/21 S/p left pneumonectomy. Complete opacification of left hemithorax. Pleural based calcifications. No infiltrate effusion or edema. CXR 01/07/24 S/p left pneumonectomy. Complete opacification of left hemithorax. Pleural based calcifications. No infiltrate  effusion or edema.  PFT: [facility] 10/31/21  FVC 1.17 (30%) FEV1 0.42 (14%) Ratio 36  TLC 62% DLCO 7.8% Interpretation: Very severe obstructive and restrictive defect with severely reduced gas exchange  Labs: CBC    Component Value Date/Time   WBC 2.8 (L) 09/10/2024 0325   RBC 3.00 (L) 09/10/2024 0325   HGB 8.6 (L) 09/10/2024 0325   HGB 12.2 (L) 09/26/2014 0431   HCT 27.8 (L) 09/10/2024 0325   HCT 36.9 (L) 09/26/2014 0431   PLT 147 (L) 09/10/2024 0325   PLT 199 09/26/2014 0431   MCV 92.7 09/10/2024 0325   MCV 88 09/26/2014 0431   MCH 28.7 09/10/2024 0325   MCHC 30.9 09/10/2024 0325   RDW 12.0 09/10/2024 0325   RDW 13.1 09/26/2014 0431   LYMPHSABS 0.7  05/27/2024 0337   LYMPHSABS 1.9 09/26/2014 0431   MONOABS 0.4 05/27/2024 0337   MONOABS 0.7 09/26/2014 0431   EOSABS 0.1 05/27/2024 0337   EOSABS 0.2 09/26/2014 0431   BASOSABS 0.0 05/27/2024 0337   BASOSABS 0.1 09/26/2014 0431   Absolute eosinophil  09/26/2014-200 09/05/2021-100  ABG    Component Value Date/Time   PHART 7.25 (L) 09/09/2024 1042   PCO2ART >123 (HH) 09/09/2024 1042   PO2ART 119 (H) 09/09/2024 1042   HCO3 49.2 (H) 09/09/2024 1449   TCO2 >50 (H) 09/05/2024 0152   O2SAT 45.9 09/09/2024 1449   SD Card Compliance Usage Days 09/15/21-11/13/21 - 53/59 days (89%) >4 hours - 85% Average Usage - 5 hours 21 min Average AHI 1.4  [facility]24 [facility] -Sp02 on room air at rest: 72% -Sp02 on 1 LPM at rest: 82% -S02 on 2 LPM at rest: 94-96% -Sp02 on 2 LPM started at 96%, then remained 93-95% for the study. Recommendation: Medically indicated: Hulen is to wear 2 LPM continuously via nasal cannula, 2 LPM during exertion via nasal cannula, and 2 LPM during sleep via CPAP/BiPAP machine.     Assessment & Plan:   Discussion: 72 year old male with multiple co-morbidities including squamous cell lung carcinoma s/p left pneumonectomy with chronic pleural effusion, severe emphysema, moderate PH, chronic respiratory failure who presents for follow-up.  Prior vent settings AVAPS-AE TV 600 ml >>> 430 cc Max Pressure 32 cm H20 EPAP min/Max 4/20 PS Min/Max 4/20 Inspiratory 1.4 sec HR 12 BMP  Severe emphysema  --CONTINUE Spiriva  2.5 TWO puffs ONCE a day --CONTINUE Wixela 250 ONE puff TWICE a day   --CONTINUE arformoterol  nebulizer --CONTINUE revenfenacin nebulizer as directedas directed --CONTINUE budesonide  nebulizer as directed --Previously attempted to enroll in Ohtuvayre . [facility]  Chronic hypoxemic and hypercapneic respiratory failure  Hx squamous cell carcinoma s/p left pneumonectomy 2005 with chronic pleural effusion Respiratory failure is  likely a sequelae of right pneumonectomy for squamous cell lung cancer resulting in right pulmonary hypertension and right heart failure in the setting of chronic lung disease.  --SpO2 goal >88% --CONTINUE supplemental O2 with 2L on exertion. Has POC   --CONTINUE non-invasive ventilation nightly with new tidal volume as noted above --[facility] sleep study negative for OSA. Demonstrated nocturnal hypoxemia --Recommend against indoor propane heater due to risk of oxygen consumption. Prefer indoor electric heater if needed in well ventilated area.  Mild-moderate pulmonary hypertension, WHO group III --Not a candidate for selective pulmonary artery vasodilators  Health Maintenance Immunization History  Administered Date(s) Administered    sv, Bivalent, Protein Subunit Rsvpref,pf (Abrysvo) 01/22/2023   Fluad Quad(high Dose 65+) 09/09/2021   INFLUENZA, HIGH  DOSE SEASONAL PF 10/07/2019, 10/16/2022, 11/22/2023, 09/06/2024   Influenza, Seasonal, Injecte, Preservative Fre 03/15/2010   Influenza-Unspecified 08/23/2016, 10/07/2019, 09/09/2021, 10/16/2022   Moderna Covid-19 Fall Seasonal Vaccine 35yrs & older 10/16/2022, 11/22/2023   Moderna Sars-Covid-2 Vaccination 01/28/2020, 02/25/2020, 10/19/2020   Pneumococcal Conjugate-13 10/07/2019   Pneumococcal Polysaccharide-23 05/24/2016   Tdap 09/27/2021   Zoster Recombinant(Shingrix) 09/27/2021, 01/03/2022   CT Lung Screen - not qualified  No orders of the defined types were placed in this encounter.  No orders of the defined types were placed in this encounter.  No follow-ups on file.   Hanya Guerin Slater Staff, MD [facility] 09/23/2024 7:31 PM      This encounter was created in error - please disregard.

## 2024-09-18 ENCOUNTER — Inpatient Hospital Stay (HOSPITAL_BASED_OUTPATIENT_CLINIC_OR_DEPARTMENT_OTHER): Admitting: Pulmonary Disease

## 2024-10-08 ENCOUNTER — Telehealth (HOSPITAL_BASED_OUTPATIENT_CLINIC_OR_DEPARTMENT_OTHER): Payer: Self-pay | Admitting: Pulmonary Disease

## 2024-10-18 DEATH — deceased

## 2024-11-04 ENCOUNTER — Inpatient Hospital Stay (HOSPITAL_BASED_OUTPATIENT_CLINIC_OR_DEPARTMENT_OTHER): Admitting: Pulmonary Disease

## 2024-11-12 ENCOUNTER — Ambulatory Visit (HOSPITAL_BASED_OUTPATIENT_CLINIC_OR_DEPARTMENT_OTHER): Admitting: Pulmonary Disease

## 2024-11-12 ENCOUNTER — Encounter (HOSPITAL_BASED_OUTPATIENT_CLINIC_OR_DEPARTMENT_OTHER): Payer: Self-pay | Admitting: Pulmonary Disease
